# Patient Record
Sex: Male | Born: 1965
Health system: Southern US, Community
[De-identification: ages and names within clinical notes are randomized; demographics above are authoritative.]

## PROBLEM LIST (undated history)

## (undated) DIAGNOSIS — E785 Hyperlipidemia, unspecified: Secondary | ICD-10-CM

## (undated) DIAGNOSIS — C801 Malignant (primary) neoplasm, unspecified: Secondary | ICD-10-CM

## (undated) DIAGNOSIS — N189 Chronic kidney disease, unspecified: Secondary | ICD-10-CM

## (undated) DIAGNOSIS — I10 Essential (primary) hypertension: Secondary | ICD-10-CM

## (undated) DIAGNOSIS — E119 Type 2 diabetes mellitus without complications: Secondary | ICD-10-CM

## (undated) DIAGNOSIS — N481 Balanitis: Secondary | ICD-10-CM

## (undated) DIAGNOSIS — K219 Gastro-esophageal reflux disease without esophagitis: Secondary | ICD-10-CM

## (undated) DIAGNOSIS — H905 Unspecified sensorineural hearing loss: Secondary | ICD-10-CM

## (undated) DIAGNOSIS — J410 Simple chronic bronchitis: Secondary | ICD-10-CM

## (undated) DIAGNOSIS — T7840XA Allergy, unspecified, initial encounter: Secondary | ICD-10-CM

## (undated) DIAGNOSIS — G4733 Obstructive sleep apnea (adult) (pediatric): Secondary | ICD-10-CM

## (undated) DIAGNOSIS — G4730 Sleep apnea, unspecified: Secondary | ICD-10-CM

## (undated) DIAGNOSIS — N2889 Other specified disorders of kidney and ureter: Secondary | ICD-10-CM

## (undated) DIAGNOSIS — J449 Chronic obstructive pulmonary disease, unspecified: Secondary | ICD-10-CM

## (undated) DIAGNOSIS — Z9989 Dependence on other enabling machines and devices: Secondary | ICD-10-CM

## (undated) DIAGNOSIS — G8929 Other chronic pain: Secondary | ICD-10-CM

## (undated) DIAGNOSIS — M1990 Unspecified osteoarthritis, unspecified site: Secondary | ICD-10-CM

## (undated) DIAGNOSIS — Z789 Other specified health status: Secondary | ICD-10-CM

## (undated) HISTORY — DX: Sleep apnea, unspecified: G47.30

## (undated) HISTORY — DX: Allergy, unspecified, initial encounter: T78.40XA

## (undated) HISTORY — PX: WRIST SURGERY: SHX841

## (undated) HISTORY — DX: Other specified health status: Z78.9

## (undated) HISTORY — DX: Malignant (primary) neoplasm, unspecified: C80.1

## (undated) HISTORY — PX: SHOULDER SURGERY: SHX246

## (undated) HISTORY — DX: Chronic obstructive pulmonary disease, unspecified: J44.9

---

## 2008-01-09 ENCOUNTER — Emergency Department (HOSPITAL_COMMUNITY): Admission: EM | Admit: 2008-01-09 | Discharge: 2008-01-10 | Payer: Self-pay | Admitting: Emergency Medicine

## 2008-07-17 ENCOUNTER — Encounter: Admission: RE | Admit: 2008-07-17 | Discharge: 2008-07-17 | Payer: Self-pay | Admitting: Orthopedic Surgery

## 2008-07-18 ENCOUNTER — Encounter: Admission: RE | Admit: 2008-07-18 | Discharge: 2008-07-18 | Payer: Self-pay | Admitting: Orthopedic Surgery

## 2008-07-21 ENCOUNTER — Encounter: Admission: RE | Admit: 2008-07-21 | Discharge: 2008-07-21 | Payer: Self-pay | Admitting: Orthopedic Surgery

## 2008-08-29 ENCOUNTER — Encounter: Admission: RE | Admit: 2008-08-29 | Discharge: 2008-09-20 | Payer: Self-pay | Admitting: Orthopedic Surgery

## 2008-09-25 ENCOUNTER — Encounter: Admission: RE | Admit: 2008-09-25 | Discharge: 2008-09-27 | Payer: Self-pay | Admitting: Orthopedic Surgery

## 2009-02-09 ENCOUNTER — Emergency Department (HOSPITAL_COMMUNITY): Admission: EM | Admit: 2009-02-09 | Discharge: 2009-02-09 | Payer: Self-pay | Admitting: Emergency Medicine

## 2009-03-04 ENCOUNTER — Ambulatory Visit (HOSPITAL_BASED_OUTPATIENT_CLINIC_OR_DEPARTMENT_OTHER): Admission: RE | Admit: 2009-03-04 | Discharge: 2009-03-04 | Payer: Self-pay | Admitting: Internal Medicine

## 2009-03-10 ENCOUNTER — Ambulatory Visit: Payer: Self-pay | Admitting: Internal Medicine

## 2009-09-23 ENCOUNTER — Ambulatory Visit (HOSPITAL_BASED_OUTPATIENT_CLINIC_OR_DEPARTMENT_OTHER): Admission: RE | Admit: 2009-09-23 | Discharge: 2009-09-23 | Payer: Self-pay | Admitting: Internal Medicine

## 2009-09-28 ENCOUNTER — Ambulatory Visit: Payer: Self-pay | Admitting: Internal Medicine

## 2011-01-28 ENCOUNTER — Other Ambulatory Visit: Payer: Self-pay | Admitting: Family Medicine

## 2011-01-28 ENCOUNTER — Ambulatory Visit
Admission: RE | Admit: 2011-01-28 | Discharge: 2011-01-28 | Disposition: A | Discharge status: Home / Self Care | Payer: No Typology Code available for payment source | Source: Ambulatory Visit | Attending: Family Medicine | Admitting: Family Medicine

## 2011-01-28 DIAGNOSIS — R059 Cough, unspecified: Secondary | ICD-10-CM

## 2011-01-28 DIAGNOSIS — R05 Cough: Secondary | ICD-10-CM

## 2011-01-28 DIAGNOSIS — R0600 Dyspnea, unspecified: Secondary | ICD-10-CM

## 2011-02-06 NOTE — Procedures (Signed)
NAME:  Stephen Chase, Stephen Chase NO.:  [id number]   MEDICAL RECORD NO.:  [id number]          PATIENT TYPE:  OUT   LOCATION:  SLEEP CENTER                 FACILITY:  [facility]   PHYSICIAN:  Clinton D. Maple Hudson, MD, FCCP, FACPDATE OF BIRTH:  April 18, 1967   DATE OF STUDY:                            NOCTURNAL POLYSOMNOGRAM   REFERRING PHYSICIAN:  Merlene Laughter. Renae Gloss, M.D.   INDICATION FOR STUDY:  Hypersomnia with sleep apnea.   EPWORTH SLEEPINESS SCORE:  Epworth sleepiness score 23/24.  BMI 35.4.  Weight 240 pounds, height 69 inches.  Neck 17 inches.   MEDICATIONS:  No home medication was reported.  This patient is deaf and  a signing interpreter came to assist.   SLEEP ARCHITECTURE:  Total sleep time 215 minutes with sleep efficiency  of 56%.  Stage I was 31.2%, stage II 56.7%, stage III absent, REM 12.1%  of total sleep time.  Sleep latency 24 minutes, REM latency 298 minutes,  awake-after-sleep onset 145 minutes, arousal index 80.7 indicating  increased EEG arousal.  No bedtime medication was taken.  Sleep  architecture was marked by frequent and sustained waking until 3:00 a.m.  with improved sleep efficiency then until final waking at 5:00 a.m.   RESPIRATORY DATA:  Apnea-hypopnea index (AHI) 70 per hour.  A total of  251 events was scored including 111 obstructive apneas, 19 central  apneas, 92 mixed apneas, and 29 hypopneas.  Events were seen in all  sleep positions but more common while supine.  REM AHI of 87.7.  An  additional 95 respiratory effort related arousals gave a cumulative  respiratory disturbance index (RDI) of 96.6 per hour.  The patient was  unable to maintain sufficient sleep to meet criteria for initiation of  CPAP titration by split protocol on the study night.   OXYGEN DATA:  Moderate-to-loud snoring with oxygen desaturation to a  nadir of 81%.  Mean oxygen saturation through the study 92.6% on room  air.   CARDIAC DATA:  Sinus rhythm with intervals of  marked sinus bradycardia  as low as 29 per minute and suggestion of sinus arrhythmia.  An example  is included with the report.   MOVEMENT-PARASOMNIA:  No significant movement disturbance.  Bathroom x4.   IMPRESSIONS-RECOMMENDATIONS:  1. Markedly fragmented sleep with low sleep efficiency, probably      mostly related to respiratory sleep disturbance, bathroom x4.  2. Severe obstructive sleep apnea/hypopnea syndrome, AHI of 70 per      hour, RDI of 96.6 per hour.  Events in all sleep positions.  Loud      snoring with oxygen desaturation to a nadir of 81%.  3. Because he was unable to accumulate enough sleep during the first      hours to meet requirements for CPAP titration by protocol, this      could not be done.  Consider return for CPAP titration or evaluate      for alternative      management as clinically indicated.  4. The patient is deaf and a sign language interpreter was available.      Clinton D. Maple Hudson, MD, FCCP, FACP  Diplomate, Biomedical engineer of Sleep Medicine  Electronically Signed     CDY/MEDQ  D:  03/09/2009 11:54:12  T:  03/10/2009 00:48:04  Job:  [id number]

## 2011-04-08 ENCOUNTER — Emergency Department (HOSPITAL_COMMUNITY)
Admission: EM | Admit: 2011-04-08 | Discharge: 2011-04-09 | Disposition: A | Discharge status: Home / Self Care | Payer: No Typology Code available for payment source | Attending: Emergency Medicine | Admitting: Emergency Medicine

## 2011-04-08 ENCOUNTER — Emergency Department (HOSPITAL_COMMUNITY): Payer: No Typology Code available for payment source

## 2011-04-08 ENCOUNTER — Ambulatory Visit (INDEPENDENT_AMBULATORY_CARE_PROVIDER_SITE_OTHER): Payer: No Typology Code available for payment source

## 2011-04-08 ENCOUNTER — Inpatient Hospital Stay (INDEPENDENT_AMBULATORY_CARE_PROVIDER_SITE_OTHER)
Admission: RE | Admit: 2011-04-08 | Discharge: 2011-04-08 | Disposition: A | Discharge status: Home / Self Care | Payer: No Typology Code available for payment source | Source: Ambulatory Visit | Attending: Family Medicine | Admitting: Family Medicine

## 2011-04-08 DIAGNOSIS — E7800 Pure hypercholesterolemia, unspecified: Secondary | ICD-10-CM | POA: Insufficient documentation

## 2011-04-08 DIAGNOSIS — E119 Type 2 diabetes mellitus without complications: Secondary | ICD-10-CM | POA: Insufficient documentation

## 2011-04-08 DIAGNOSIS — N289 Disorder of kidney and ureter, unspecified: Secondary | ICD-10-CM | POA: Insufficient documentation

## 2011-04-08 DIAGNOSIS — K219 Gastro-esophageal reflux disease without esophagitis: Secondary | ICD-10-CM | POA: Insufficient documentation

## 2011-04-08 DIAGNOSIS — H9190 Unspecified hearing loss, unspecified ear: Secondary | ICD-10-CM | POA: Insufficient documentation

## 2011-04-08 DIAGNOSIS — K3580 Unspecified acute appendicitis: Secondary | ICD-10-CM

## 2011-04-08 DIAGNOSIS — R109 Unspecified abdominal pain: Secondary | ICD-10-CM

## 2011-04-08 DIAGNOSIS — I10 Essential (primary) hypertension: Secondary | ICD-10-CM | POA: Insufficient documentation

## 2011-04-08 DIAGNOSIS — Z79899 Other long term (current) drug therapy: Secondary | ICD-10-CM | POA: Insufficient documentation

## 2011-04-08 LAB — HEPATIC FUNCTION PANEL
ALT: 87 U/L — ABNORMAL HIGH (ref 0–53)
Albumin: 4.1 g/dL (ref 3.5–5.2)
Alkaline Phosphatase: 93 U/L (ref 39–117)
Bilirubin, Direct: 0.1 mg/dL (ref 0.0–0.3)
Indirect Bilirubin: 0.4 mg/dL (ref 0.3–0.9)
Total Protein: 7.5 g/dL (ref 6.0–8.3)

## 2011-04-08 LAB — CBC
HCT: 45 % (ref 39.0–52.0)
MCH: 32.7 pg (ref 26.0–34.0)
MCHC: 36.4 g/dL — ABNORMAL HIGH (ref 30.0–36.0)
MCV: 89.8 fL (ref 78.0–100.0)

## 2011-04-08 LAB — POCT I-STAT, CHEM 8
BUN: 21 mg/dL (ref 6–23)
Creatinine, Ser: 2.1 mg/dL — ABNORMAL HIGH (ref 0.50–1.35)
HCT: 51 % (ref 39.0–52.0)
Hemoglobin: 17.3 g/dL — ABNORMAL HIGH (ref 13.0–17.0)
Sodium: 135 mEq/L (ref 135–145)
TCO2: 21 mmol/L (ref 0–100)

## 2011-04-08 LAB — POCT URINALYSIS DIP (DEVICE)
Hgb urine dipstick: NEGATIVE
Leukocytes, UA: NEGATIVE
Specific Gravity, Urine: >=1.03 (ref 1.005–1.030)
Urobilinogen, UA: 0.2 mg/dL (ref 0.0–1.0)
pH: 5 (ref 5.0–8.0)

## 2011-04-08 LAB — DIFFERENTIAL
Eosinophils Relative: 1 % (ref 0–5)
Lymphocytes Relative: 27 % (ref 12–46)
Lymphs Abs: 4.2 10*3/uL — ABNORMAL HIGH (ref 0.7–4.0)
Monocytes Absolute: 1.1 10*3/uL — ABNORMAL HIGH (ref 0.1–1.0)

## 2011-04-09 LAB — URINE CULTURE

## 2011-06-16 LAB — COMPREHENSIVE METABOLIC PANEL
AST: 39 — ABNORMAL HIGH
Albumin: 3.7
BUN: 7
CO2: 29
Creatinine, Ser: 1.11
GFR calc non Af Amer: >60
Total Protein: 6.8

## 2011-06-16 LAB — DIFFERENTIAL
Basophils Absolute: 0
Basophils Relative: 0
Eosinophils Absolute: 0.3
Eosinophils Relative: 3
Lymphocytes Relative: 31
Lymphs Abs: 3.1
Monocytes Absolute: 0.8
Monocytes Relative: 8
Neutro Abs: 5.8
Neutrophils Relative %: 58

## 2011-06-16 LAB — CBC
HCT: 45.9
Hemoglobin: 15.9
MCHC: 34.6
MCV: 90.4
Platelets: 204
RBC: 5.07
RDW: 13.1
WBC: 10

## 2011-06-16 LAB — COMPREHENSIVE METABOLIC PANEL WITH GFR
ALT: 73 — ABNORMAL HIGH
Alkaline Phosphatase: 101
Calcium: 9.5
Chloride: 104
GFR calc Af Amer: >60
Glucose, Bld: 102 — ABNORMAL HIGH
Potassium: 3.6
Sodium: 141
Total Bilirubin: 1.1

## 2011-06-16 LAB — LIPASE, BLOOD: Lipase: 36

## 2013-02-07 ENCOUNTER — Ambulatory Visit
Admission: RE | Admit: 2013-02-07 | Discharge: 2013-02-07 | Disposition: A | Discharge status: Home / Self Care | Payer: No Typology Code available for payment source | Source: Ambulatory Visit | Attending: Nurse Practitioner | Admitting: Nurse Practitioner

## 2013-02-07 ENCOUNTER — Other Ambulatory Visit: Payer: Self-pay | Admitting: Nurse Practitioner

## 2013-02-07 DIAGNOSIS — W19XXXA Unspecified fall, initial encounter: Secondary | ICD-10-CM

## 2013-12-14 ENCOUNTER — Encounter (HOSPITAL_BASED_OUTPATIENT_CLINIC_OR_DEPARTMENT_OTHER): Payer: Self-pay | Admitting: *Deleted

## 2013-12-14 ENCOUNTER — Other Ambulatory Visit: Payer: Self-pay | Admitting: Urology

## 2013-12-14 NOTE — Progress Notes (Signed)
COMMUNICATED WITH PT [place] [phone].  PT AND WIFE ARE DEAF.  A MALE DEAF INTERPRETOR ARRANGEMENT WITH CARE MANAGEMENT TO ARRIVE AT 0800.  PT TO ARRIVE AT 0815.  NPO AFTER MN WITH EXCEPTION SIP OF WATER WITH COREG.  NEEDS ISTAT AND EKG.

## 2013-12-19 ENCOUNTER — Other Ambulatory Visit: Payer: Self-pay

## 2013-12-19 ENCOUNTER — Encounter (HOSPITAL_BASED_OUTPATIENT_CLINIC_OR_DEPARTMENT_OTHER)
Admission: RE | Disposition: A | Discharge status: Home / Self Care | Payer: Commercial Managed Care - HMO | Source: Ambulatory Visit | Attending: Urology

## 2013-12-19 ENCOUNTER — Encounter (HOSPITAL_BASED_OUTPATIENT_CLINIC_OR_DEPARTMENT_OTHER): Payer: Self-pay | Admitting: *Deleted

## 2013-12-19 ENCOUNTER — Ambulatory Visit (HOSPITAL_BASED_OUTPATIENT_CLINIC_OR_DEPARTMENT_OTHER): Payer: Medicare HMO | Admitting: Anesthesiology

## 2013-12-19 ENCOUNTER — Encounter (HOSPITAL_BASED_OUTPATIENT_CLINIC_OR_DEPARTMENT_OTHER): Payer: Medicare HMO | Admitting: Anesthesiology

## 2013-12-19 ENCOUNTER — Ambulatory Visit (HOSPITAL_BASED_OUTPATIENT_CLINIC_OR_DEPARTMENT_OTHER)
Admission: RE | Admit: 2013-12-19 | Discharge: 2013-12-19 | Disposition: A | Discharge status: Home / Self Care | Payer: Medicare HMO | Source: Ambulatory Visit | Attending: Urology | Admitting: Urology

## 2013-12-19 DIAGNOSIS — Z79899 Other long term (current) drug therapy: Secondary | ICD-10-CM | POA: Insufficient documentation

## 2013-12-19 DIAGNOSIS — N476 Balanoposthitis: Secondary | ICD-10-CM | POA: Insufficient documentation

## 2013-12-19 DIAGNOSIS — N478 Other disorders of prepuce: Secondary | ICD-10-CM | POA: Insufficient documentation

## 2013-12-19 DIAGNOSIS — N471 Phimosis: Secondary | ICD-10-CM | POA: Insufficient documentation

## 2013-12-19 DIAGNOSIS — E669 Obesity, unspecified: Secondary | ICD-10-CM | POA: Insufficient documentation

## 2013-12-19 DIAGNOSIS — K219 Gastro-esophageal reflux disease without esophagitis: Secondary | ICD-10-CM | POA: Insufficient documentation

## 2013-12-19 DIAGNOSIS — F17200 Nicotine dependence, unspecified, uncomplicated: Secondary | ICD-10-CM | POA: Insufficient documentation

## 2013-12-19 DIAGNOSIS — G4730 Sleep apnea, unspecified: Secondary | ICD-10-CM | POA: Insufficient documentation

## 2013-12-19 DIAGNOSIS — E7800 Pure hypercholesterolemia, unspecified: Secondary | ICD-10-CM | POA: Insufficient documentation

## 2013-12-19 DIAGNOSIS — E119 Type 2 diabetes mellitus without complications: Secondary | ICD-10-CM | POA: Insufficient documentation

## 2013-12-19 DIAGNOSIS — I10 Essential (primary) hypertension: Secondary | ICD-10-CM | POA: Insufficient documentation

## 2013-12-19 HISTORY — DX: Unspecified sensorineural hearing loss: H90.5

## 2013-12-19 HISTORY — DX: Unspecified osteoarthritis, unspecified site: M19.90

## 2013-12-19 HISTORY — DX: Essential (primary) hypertension: I10

## 2013-12-19 HISTORY — PX: CIRCUMCISION: SHX1350

## 2013-12-19 HISTORY — DX: Gastro-esophageal reflux disease without esophagitis: K21.9

## 2013-12-19 HISTORY — DX: Balanitis: N48.1

## 2013-12-19 HISTORY — DX: Hyperlipidemia, unspecified: E78.5

## 2013-12-19 HISTORY — DX: Type 2 diabetes mellitus without complications: E11.9

## 2013-12-19 HISTORY — DX: Dependence on other enabling machines and devices: Z99.89

## 2013-12-19 HISTORY — DX: Simple chronic bronchitis: J41.0

## 2013-12-19 HISTORY — DX: Obstructive sleep apnea (adult) (pediatric): G47.33

## 2013-12-19 LAB — POCT I-STAT 4, (NA,K, GLUC, HGB,HCT)
Glucose, Bld: 126 mg/dL — ABNORMAL HIGH (ref 70–99)
HCT: 47 % (ref 39.0–52.0)
HEMOGLOBIN: 16 g/dL (ref 13.0–17.0)
Potassium: 3.7 mEq/L (ref 3.7–5.3)
Sodium: 142 mEq/L (ref 137–147)

## 2013-12-19 LAB — GLUCOSE, CAPILLARY: Glucose-Capillary: 110 mg/dL — ABNORMAL HIGH (ref 70–99)

## 2013-12-19 SURGERY — CIRCUMCISION, ADULT
Anesthesia: General | Site: Penis

## 2013-12-19 MED ORDER — MIDAZOLAM HCL 2 MG/2ML IJ SOLN
INTRAMUSCULAR | Status: AC
  Filled 2013-12-19: qty 2

## 2013-12-19 MED ORDER — DEXAMETHASONE SODIUM PHOSPHATE 4 MG/ML IJ SOLN
INTRAMUSCULAR | Status: DC | PRN
  Administered 2013-12-19: 10 mg via INTRAVENOUS

## 2013-12-19 MED ORDER — FENTANYL CITRATE 0.05 MG/ML IJ SOLN
25.0000 ug | INTRAMUSCULAR | Status: DC | PRN
  Filled 2013-12-19: qty 1

## 2013-12-19 MED ORDER — MINERAL OIL LIGHT 100 % EX OIL
TOPICAL_OIL | CUTANEOUS | Status: DC | PRN
  Administered 2013-12-19: 1 via TOPICAL

## 2013-12-19 MED ORDER — PROPOFOL 10 MG/ML IV BOLUS
INTRAVENOUS | Status: DC | PRN
  Administered 2013-12-19: 200 mg via INTRAVENOUS

## 2013-12-19 MED ORDER — BUPIVACAINE HCL (PF) 0.25 % IJ SOLN
INTRAMUSCULAR | Status: DC | PRN
  Administered 2013-12-19: 10 mL

## 2013-12-19 MED ORDER — HYDROCODONE-ACETAMINOPHEN 5-325 MG PO TABS: 1.0000 | ORAL_TABLET | Freq: Four times a day (QID) | ORAL | Status: DC | PRN

## 2013-12-19 MED ORDER — FENTANYL CITRATE 0.05 MG/ML IJ SOLN
INTRAMUSCULAR | Status: DC | PRN
  Administered 2013-12-19 (×4): 25 ug via INTRAVENOUS
  Administered 2013-12-19: 50 ug via INTRAVENOUS

## 2013-12-19 MED ORDER — FENTANYL CITRATE 0.05 MG/ML IJ SOLN
INTRAMUSCULAR | Status: AC
  Filled 2013-12-19: qty 6

## 2013-12-19 MED ORDER — LIDOCAINE HCL (CARDIAC) 20 MG/ML IV SOLN
INTRAVENOUS | Status: DC | PRN
  Administered 2013-12-19: 60 mg via INTRAVENOUS

## 2013-12-19 MED ORDER — MIDAZOLAM HCL 5 MG/5ML IJ SOLN
INTRAMUSCULAR | Status: DC | PRN
  Administered 2013-12-19: 2 mg via INTRAVENOUS

## 2013-12-19 MED ORDER — LACTATED RINGERS IV SOLN
INTRAVENOUS | Status: DC
  Administered 2013-12-19: 09:00:00 via INTRAVENOUS
  Filled 2013-12-19: qty 1000

## 2013-12-19 MED ORDER — BACITRACIN-NEOMYCIN-POLYMYXIN 400-5-5000 EX OINT
TOPICAL_OINTMENT | CUTANEOUS | Status: DC | PRN
  Administered 2013-12-19: 1 via TOPICAL

## 2013-12-19 MED ORDER — PROMETHAZINE HCL 25 MG/ML IJ SOLN
6.2500 mg | INTRAMUSCULAR | Status: DC | PRN
  Filled 2013-12-19: qty 1

## 2013-12-19 SURGICAL SUPPLY — 30 items
BANDAGE CO FLEX L/F 1IN X 5YD (GAUZE/BANDAGES/DRESSINGS) IMPLANT
BLADE SURG 15 STRL LF DISP TIS (BLADE) ×1 IMPLANT
BLADE SURG 15 STRL SS (BLADE) ×1
BNDG COHESIVE 1X5 TAN STRL LF (GAUZE/BANDAGES/DRESSINGS) ×2 IMPLANT
BNDG CONFORM 2 STRL LF (GAUZE/BANDAGES/DRESSINGS) ×2 IMPLANT
CLOTH BEACON ORANGE TIMEOUT ST (SAFETY) ×2 IMPLANT
COVER MAYO STAND STRL (DRAPES) ×2 IMPLANT
COVER TABLE BACK 60X90 (DRAPES) ×2 IMPLANT
DRAPE PED LAPAROTOMY (DRAPES) ×2 IMPLANT
ELECT REM PT RETURN 9FT ADLT (ELECTROSURGICAL) ×2
ELECTRODE REM PT RTRN 9FT ADLT (ELECTROSURGICAL) ×1 IMPLANT
GAUZE VASELINE 1X8 (GAUZE/BANDAGES/DRESSINGS) ×2 IMPLANT
GLOVE BIO SURGEON STRL SZ7 (GLOVE) ×2 IMPLANT
GLOVE BIOGEL M STER SZ 6 (GLOVE) ×2 IMPLANT
GLOVE BIOGEL PI IND STRL 6.5 (GLOVE) ×1 IMPLANT
GLOVE BIOGEL PI IND STRL 7.5 (GLOVE) ×1 IMPLANT
GLOVE BIOGEL PI INDICATOR 6.5 (GLOVE) ×1
GLOVE BIOGEL PI INDICATOR 7.5 (GLOVE) ×1
GOWN PREVENTION PLUS LG XLONG (DISPOSABLE) IMPLANT
GOWN STRL REUS W/TWL LRG LVL3 (GOWN DISPOSABLE) ×4 IMPLANT
NEEDLE HYPO 25X1 1.5 SAFETY (NEEDLE) ×2 IMPLANT
NS IRRIG 500ML POUR BTL (IV SOLUTION) ×2 IMPLANT
PACK BASIN DAY SURGERY FS (CUSTOM PROCEDURE TRAY) ×2 IMPLANT
PENCIL BUTTON HOLSTER BLD 10FT (ELECTRODE) ×2 IMPLANT
SUT CHROMIC 4 0 P 3 18 (SUTURE) ×4 IMPLANT
SUT CHROMIC 5 0 RB 1 27 (SUTURE) IMPLANT
SYR CONTROL 10ML LL (SYRINGE) ×2 IMPLANT
TOWEL OR 17X24 6PK STRL BLUE (TOWEL DISPOSABLE) ×4 IMPLANT
TRAY DSU PREP LF (CUSTOM PROCEDURE TRAY) ×2 IMPLANT
WATER STERILE IRR 500ML POUR (IV SOLUTION) IMPLANT

## 2013-12-19 NOTE — Op Note (Signed)
Stephen Chase is a 48 y.o.   12/19/2013  General  Preop diagnosis: Balanitis, phimosis  Postop diagnosis: Same  Procedure done: Circumcision  Surgeon: Charlene Brooke. Dierre Crevier  Anesthesia: General  Indication: Patient is a 48 years old male who presented with complaints of irritation of the foreskin for several weeks. He was found to have phimosis and balanitis. He was treated with nystatin cream. He has a history of diabetes. The balanitis cleared up. He is now scheduled for circumcision.  Procedure: The patient was identified by his wrist band and proper timeout was taken.  Under general anesthesia he was prepped and draped and placed in the supine position. 2 circumferential incisions were made on the foreskin and the foreskin in between those 2 incisions was excised. A frenulotomy was done. Hemostasis was secured with electrocautery. Skin approximation was done with #4-0 chromic. Sterile  dressing was then applied to the wound.  The patient tolerated the procedure well and left the OR in satisfactory condition to postanesthesia care unit.  EBL: Minimal  Needles, sponges count: Correct

## 2013-12-19 NOTE — Transfer of Care (Signed)
Immediate Anesthesia Transfer of Care Note  Patient: Stephen Chase  Procedure(s) Performed: Procedure(s) (LRB): CIRCUMCISION ADULT (N/A)  Patient Location: PACU  Anesthesia Type: General  Level of Consciousness: awake, oriented, sedated and patient cooperative  Airway & Oxygen Therapy: Patient Spontanous Breathing and Patient connected to face mask oxygen  Post-op Assessment: Report given to PACU RN and Post -op Vital signs reviewed and stable  Post vital signs: Reviewed and stable  Complications: No apparent anesthesia complications

## 2013-12-19 NOTE — H&P (Signed)
History of Present Illness Stephen Chase returns for follow-up. He was prescribed Nystatin cream for balanitis. He is doing much better. He does not have any more penile pain. He is able to retract the foreskin. He has a history of diabetes. Urinalysis shows >1000 mgm glucose.   Past Medical History Problems  1. History of hypercholesterolemia (V12.29) 2. History of hypertension (V12.59) 3. History of sleep apnea (V13.89)  Surgical History Problems  1. History of No Surgical Problems  Current Meds 1. Carvedilol 6.25 MG Oral Tablet;  Therapy: (Recorded:03Mar2015) to Recorded 2. Crestor 40 MG Oral Tablet;  Therapy: (Recorded:03Mar2015) to Recorded 3. Fenofibrate 160 MG Oral Tablet;  Therapy: (Recorded:03Mar2015) to Recorded 4. Flomax 0.4 MG Oral Capsule (Tamsulosin HCl);  Therapy: (Recorded:03Mar2015) to Recorded 5. Lisinopril 20 MG Oral Tablet;  Therapy: (Recorded:03Mar2015) to Recorded 6. MetFORMIN HCl - 500 MG Oral Tablet;  Therapy: (Recorded:03Mar2015) to Recorded 7. Niacin ER 1000 MG Oral Tablet Extended Release;  Therapy: (Recorded:03Mar2015) to Recorded 8. Nystatin-Triamcinolone 100000-0.1 UNIT/GM-% External Cream; APPLY SPARINGLY TO  AFFECTED AREA(S) 3 TIMES A DAY;  Therapy: [id number] to (Evaluate:04Mar2015)  Requested for: [id number]; Last  Rx:03Mar2015 Ordered 9. Omeprazole 40 MG Oral Capsule Delayed Release;  Therapy: (Recorded:03Mar2015) to Recorded 10. Zetia 10 MG Oral Tablet;   Therapy: (Recorded:03Mar2015) to Recorded  Allergies Medication  1. No Known Drug Allergies  Family History Problems  1. Family history of malignant neoplasm (V16.9) : Mother 2. Family history of myocardial infarction (V17.3) : Father  Social History Problems  1. Alcohol use (V49.89) 2. Caffeine use (V49.89) 3. Current smoker (305.1) 4. Father deceased   39 liver failure 5. Married 6. Mother deceased   47 cancer unspecified  Review of Systems Genitourinary, constitutional,  skin, eye, otolaryngeal, hematologic/lymphatic, cardiovascular, pulmonary, endocrine, musculoskeletal, gastrointestinal, neurological and psychiatric system(s) were reviewed and pertinent findings if present are noted.  Genitourinary: urinary frequency and penile erythema.    Physical Exam Constitutional: Well nourished and well developed . No acute distress.  ENT:. The ears and nose are normal in appearance.  Neck: The appearance of the neck is normal and no neck mass is present.  Pulmonary: No respiratory distress and normal respiratory rhythm and effort.  Cardiovascular: Heart rate and rhythm are normal . No peripheral edema.  Abdomen: The abdomen is soft and nontender. No masses are palpated. No CVA tenderness. No hernias are palpable. No hepatosplenomegaly noted.  Genitourinary: Examination of the penis demonstrates no discharge, no masses, no lesions and a normal meatus. The scrotum is without lesions. The right epididymis is palpably normal and non-tender. The left epididymis is palpably normal and non-tender. The right testis is non-tender and without masses. The left testis is non-tender and without masses.  Lymphatics: The femoral and inguinal nodes are not enlarged or tender.  Skin: Normal skin turgor, no visible rash and no visible skin lesions.  Neuro/Psych:. Mood and affect are appropriate.    Results/Data Urine [Data Includes: Last 1 Day]   [id number]  COLOR YELLOW   APPEARANCE CLEAR   SPECIFIC GRAVITY 1.020   pH 5.5   GLUCOSE > 1000 mg/dL  BILIRUBIN NEG   KETONE NEG mg/dL  BLOOD NEG   PROTEIN NEG mg/dL  UROBILINOGEN 0.2 mg/dL  NITRITE NEG   LEUKOCYTE ESTERASE NEG    Assessment Assessed  1. Balanitis (607.1) 2. Diabetes mellitus (250.00)  Plan Balanitis  1. Follow-up Schedule Surgery Office  Follow-up  Status: Hold For - Appointment   Requested for: [id number] Health Maintenance  2. UA With  REFLEX; [Do Not Release]; Status:Resulted - Requires Verification;    Done:  [id number] 03:37PM  He needs circumcision. The procedure, risks, benefits were explained to the patient through an interpreter. The risks include but are not limited to hemorrhage, infection, hematoma, skin loss.  He states that he understands and wish

## 2013-12-19 NOTE — Anesthesia Preprocedure Evaluation (Addendum)
Anesthesia Evaluation  Patient identified by MRN, date of birth, ID band Patient awake    Reviewed: Allergy & Precautions, H&P , NPO status , Patient's Chart, lab work & pertinent test results  Airway Mallampati: II TM Distance: >3 FB Neck ROM: Full    Dental no notable dental hx.    Pulmonary sleep apnea , Current Smoker,  breath sounds clear to auscultation  Pulmonary exam normal       Cardiovascular Exercise Tolerance: Good hypertension, Pt. on medications and Pt. on home beta blockers Rhythm:Regular Rate:Normal  ECG: normal.   Neuro/Psych negative neurological ROS  negative psych ROS   GI/Hepatic Neg liver ROS, GERD-  Medicated,  Endo/Other  diabetes, Type 2, Oral Hypoglycemic Agents  Renal/GU negative Renal ROS  negative genitourinary   Musculoskeletal negative musculoskeletal ROS (+)   Abdominal (+) + obese,   Peds negative pediatric ROS (+)  Hematology negative hematology ROS (+)   Anesthesia Other Findings   Reproductive/Obstetrics negative OB ROS                         Anesthesia Physical Anesthesia Plan  ASA: II  Anesthesia Plan: General   Post-op Pain Management:    Induction: Intravenous  Airway Management Planned: LMA  Additional Equipment:   Intra-op Plan:   Post-operative Plan: Extubation in OR  Informed Consent: I have reviewed the patients History and Physical, chart, labs and discussed the procedure including the risks, benefits and alternatives for the proposed anesthesia with the patient or authorized representative who has indicated his/her understanding and acceptance.   Dental advisory given  Plan Discussed with: CRNA  Anesthesia Plan Comments:         Anesthesia Quick Evaluation

## 2013-12-19 NOTE — Anesthesia Procedure Notes (Signed)
Procedure Name: LMA Insertion Date/Time: 12/19/2013 10:05 AM Performed by: Denna Haggard D Pre-anesthesia Checklist: Patient identified, Emergency Drugs available, Suction available and Patient being monitored Patient Re-evaluated:Patient Re-evaluated prior to inductionOxygen Delivery Method: Circle System Utilized Preoxygenation: Pre-oxygenation with 100% oxygen Intubation Type: IV induction Ventilation: Mask ventilation without difficulty LMA: LMA inserted LMA Size: 4.0 Number of attempts: 1 Airway Equipment and Method: bite block Placement Confirmation: positive ETCO2 Tube secured with: Tape Dental Injury: Teeth and Oropharynx as per pre-operative assessment

## 2013-12-19 NOTE — Discharge Instructions (Signed)
°  Post Anesthesia Home Care Instructions  Activity: Get plenty of rest for the remainder of the day. A responsible adult should stay with you for 24 hours following the procedure.  For the next 24 hours, DO NOT: -Drive a car -Paediatric nurse -Drink alcoholic beverages -Take any medication unless instructed by your physician -Make any legal decisions or sign important papers.  Meals: Start with liquid foods such as gelatin or soup. Progress to regular foods as tolerated. Avoid greasy, spicy, heavy foods. If nausea and/or vomiting occur, drink only clear liquids until the nausea and/or vomiting subsides. Call your physician if vomiting continues.  Special Instructions/Symptoms: Your throat may feel dry or sore from the anesthesia or the breathing tube placed in your throat during surgery. If this causes discomfort, gargle with warm salt water. The discomfort should disappear within 24 hours.  Circumcision-Home Care Instructions  The following instructions have been prepared to help you care for yourself upon your return home today.   Wound Care & Hygiene:   You may apply ice to the penis.  This may help to decrease swelling.  Remove the dressing tomorrow.  If the dressing falls off before then, leave it off.  You may shower or bathe in 48 hours  Gently wash the penis with soap and water.  The stitches do not need to be removed.  Activity:  Do not drive or operate any equipment today.  The effects of anesthesia are still present, drowsiness may result.  Rest today, not necessarily flat bed rest, just take it easy.  You may resume your normal activity in one to two days or as indicated by your physician.  Sexual Activity:  Erection and sexual relations should be avoided for *2 weeks.  Return to Work:  One to two days or as indicated by your physician ***.  Diet:  Drink liquids or eat a very light diet this evening.  You may resume a regular diet tomorrow.  General Expectations of  your surgery:   You may have a small amount of bleeding  The penis will be swollen and bruised for approximately one week  You may wake during the night with an erection, usually this is caused by having a full bladder so you should try to urinate (pass your water) to relieve the erection or apply ice to the penis  Unexpected Observations - Call your doctor if these occur!  Persistent or heavy bleeding  Temperature of 101 degrees or more  Severe pain not relieved by medication  Return to Office *** .  Call to schedule your appointment ***.  Additional Instructions:  ***  Patient's Signature:__________________________________________________  Physician's Signature:___________________ Nurse's Signature_______________  [facility] [phone] [address] [place], [place] [address] Tel: [phone]

## 2013-12-20 ENCOUNTER — Encounter (HOSPITAL_BASED_OUTPATIENT_CLINIC_OR_DEPARTMENT_OTHER): Payer: Self-pay | Admitting: Urology

## 2013-12-20 NOTE — Anesthesia Postprocedure Evaluation (Signed)
  Anesthesia Post-op Note  Patient: Stephen Chase  Procedure(s) Performed: Procedure(s) (LRB): CIRCUMCISION ADULT (N/A)  Patient Location: PACU  Anesthesia Type: General  Level of Consciousness: awake and alert   Airway and Oxygen Therapy: Patient Spontanous Breathing  Post-op Pain: mild  Post-op Assessment: Post-op Vital signs reviewed, Patient's Cardiovascular Status Stable, Respiratory Function Stable, Patent Airway and No signs of Nausea or vomiting  Last Vitals:  Filed Vitals:   12/19/13 1245  BP: 101/64  Pulse: 64  Temp: 36.2 C  Resp: 16    Post-op Vital Signs: stable   Complications: No apparent anesthesia complications

## 2014-10-20 DIAGNOSIS — G4733 Obstructive sleep apnea (adult) (pediatric): Secondary | ICD-10-CM | POA: Diagnosis not present

## 2014-10-23 DIAGNOSIS — H913 Deaf nonspeaking, not elsewhere classified: Secondary | ICD-10-CM | POA: Diagnosis not present

## 2014-10-23 DIAGNOSIS — E782 Mixed hyperlipidemia: Secondary | ICD-10-CM | POA: Diagnosis not present

## 2014-10-23 DIAGNOSIS — I10 Essential (primary) hypertension: Secondary | ICD-10-CM | POA: Diagnosis not present

## 2014-10-23 DIAGNOSIS — J309 Allergic rhinitis, unspecified: Secondary | ICD-10-CM | POA: Diagnosis not present

## 2014-10-23 DIAGNOSIS — E119 Type 2 diabetes mellitus without complications: Secondary | ICD-10-CM | POA: Diagnosis not present

## 2014-10-29 DIAGNOSIS — G4733 Obstructive sleep apnea (adult) (pediatric): Secondary | ICD-10-CM | POA: Diagnosis not present

## 2014-11-19 DIAGNOSIS — G4733 Obstructive sleep apnea (adult) (pediatric): Secondary | ICD-10-CM | POA: Diagnosis not present

## 2014-12-19 DIAGNOSIS — G4733 Obstructive sleep apnea (adult) (pediatric): Secondary | ICD-10-CM | POA: Diagnosis not present

## 2015-01-19 DIAGNOSIS — G4733 Obstructive sleep apnea (adult) (pediatric): Secondary | ICD-10-CM | POA: Diagnosis not present

## 2015-01-29 DIAGNOSIS — G4733 Obstructive sleep apnea (adult) (pediatric): Secondary | ICD-10-CM | POA: Diagnosis not present

## 2015-02-18 DIAGNOSIS — G4733 Obstructive sleep apnea (adult) (pediatric): Secondary | ICD-10-CM | POA: Diagnosis not present

## 2015-03-13 DIAGNOSIS — J371 Chronic laryngotracheitis: Secondary | ICD-10-CM | POA: Diagnosis not present

## 2015-03-13 DIAGNOSIS — I10 Essential (primary) hypertension: Secondary | ICD-10-CM | POA: Diagnosis not present

## 2015-03-13 DIAGNOSIS — G4739 Other sleep apnea: Secondary | ICD-10-CM | POA: Diagnosis not present

## 2015-03-13 DIAGNOSIS — H913 Deaf nonspeaking, not elsewhere classified: Secondary | ICD-10-CM | POA: Diagnosis not present

## 2015-03-13 DIAGNOSIS — E119 Type 2 diabetes mellitus without complications: Secondary | ICD-10-CM | POA: Diagnosis not present

## 2015-03-13 DIAGNOSIS — E782 Mixed hyperlipidemia: Secondary | ICD-10-CM | POA: Diagnosis not present

## 2015-03-13 DIAGNOSIS — Z79899 Other long term (current) drug therapy: Secondary | ICD-10-CM | POA: Diagnosis not present

## 2015-03-13 DIAGNOSIS — Z5181 Encounter for therapeutic drug level monitoring: Secondary | ICD-10-CM | POA: Diagnosis not present

## 2015-03-21 DIAGNOSIS — G4733 Obstructive sleep apnea (adult) (pediatric): Secondary | ICD-10-CM | POA: Diagnosis not present

## 2015-04-20 DIAGNOSIS — G4733 Obstructive sleep apnea (adult) (pediatric): Secondary | ICD-10-CM | POA: Diagnosis not present

## 2015-06-18 DIAGNOSIS — I10 Essential (primary) hypertension: Secondary | ICD-10-CM | POA: Diagnosis not present

## 2015-06-18 DIAGNOSIS — Z125 Encounter for screening for malignant neoplasm of prostate: Secondary | ICD-10-CM | POA: Diagnosis not present

## 2015-06-18 DIAGNOSIS — E119 Type 2 diabetes mellitus without complications: Secondary | ICD-10-CM | POA: Diagnosis not present

## 2015-06-18 DIAGNOSIS — Z79899 Other long term (current) drug therapy: Secondary | ICD-10-CM | POA: Diagnosis not present

## 2015-06-18 DIAGNOSIS — H913 Deaf nonspeaking, not elsewhere classified: Secondary | ICD-10-CM | POA: Diagnosis not present

## 2015-06-18 DIAGNOSIS — Z0000 Encounter for general adult medical examination without abnormal findings: Secondary | ICD-10-CM | POA: Diagnosis not present

## 2015-06-18 DIAGNOSIS — Z23 Encounter for immunization: Secondary | ICD-10-CM | POA: Diagnosis not present

## 2015-06-18 DIAGNOSIS — E782 Mixed hyperlipidemia: Secondary | ICD-10-CM | POA: Diagnosis not present

## 2015-08-01 DIAGNOSIS — G4733 Obstructive sleep apnea (adult) (pediatric): Secondary | ICD-10-CM | POA: Diagnosis not present

## 2015-09-20 DIAGNOSIS — E119 Type 2 diabetes mellitus without complications: Secondary | ICD-10-CM | POA: Diagnosis not present

## 2015-09-20 DIAGNOSIS — Z79899 Other long term (current) drug therapy: Secondary | ICD-10-CM | POA: Diagnosis not present

## 2015-09-20 DIAGNOSIS — I10 Essential (primary) hypertension: Secondary | ICD-10-CM | POA: Diagnosis not present

## 2015-09-20 DIAGNOSIS — L0390 Cellulitis, unspecified: Secondary | ICD-10-CM | POA: Diagnosis not present

## 2015-09-20 DIAGNOSIS — E782 Mixed hyperlipidemia: Secondary | ICD-10-CM | POA: Diagnosis not present

## 2015-09-20 DIAGNOSIS — L03115 Cellulitis of right lower limb: Secondary | ICD-10-CM | POA: Diagnosis not present

## 2015-10-21 DIAGNOSIS — Z136 Encounter for screening for cardiovascular disorders: Secondary | ICD-10-CM | POA: Diagnosis not present

## 2015-10-21 DIAGNOSIS — E1165 Type 2 diabetes mellitus with hyperglycemia: Secondary | ICD-10-CM | POA: Diagnosis not present

## 2015-10-21 DIAGNOSIS — E782 Mixed hyperlipidemia: Secondary | ICD-10-CM | POA: Diagnosis not present

## 2015-10-21 DIAGNOSIS — I10 Essential (primary) hypertension: Secondary | ICD-10-CM | POA: Diagnosis not present

## 2015-11-01 DIAGNOSIS — I10 Essential (primary) hypertension: Secondary | ICD-10-CM | POA: Diagnosis not present

## 2015-11-01 DIAGNOSIS — E782 Mixed hyperlipidemia: Secondary | ICD-10-CM | POA: Diagnosis not present

## 2015-11-04 DIAGNOSIS — I2510 Atherosclerotic heart disease of native coronary artery without angina pectoris: Secondary | ICD-10-CM | POA: Diagnosis not present

## 2015-11-05 DIAGNOSIS — Z136 Encounter for screening for cardiovascular disorders: Secondary | ICD-10-CM | POA: Diagnosis not present

## 2015-11-05 DIAGNOSIS — G4733 Obstructive sleep apnea (adult) (pediatric): Secondary | ICD-10-CM | POA: Diagnosis not present

## 2015-11-05 DIAGNOSIS — E1165 Type 2 diabetes mellitus with hyperglycemia: Secondary | ICD-10-CM | POA: Diagnosis not present

## 2015-11-05 DIAGNOSIS — E782 Mixed hyperlipidemia: Secondary | ICD-10-CM | POA: Diagnosis not present

## 2015-11-05 DIAGNOSIS — I10 Essential (primary) hypertension: Secondary | ICD-10-CM | POA: Diagnosis not present

## 2015-12-09 ENCOUNTER — Encounter (HOSPITAL_COMMUNITY): Payer: Self-pay | Admitting: Emergency Medicine

## 2015-12-09 ENCOUNTER — Emergency Department (HOSPITAL_COMMUNITY)
Admission: EM | Admit: 2015-12-09 | Discharge: 2015-12-09 | Disposition: A | Discharge status: Home / Self Care | Payer: Commercial Managed Care - HMO | Source: Home / Self Care | Attending: Family Medicine | Admitting: Family Medicine

## 2015-12-09 DIAGNOSIS — J069 Acute upper respiratory infection, unspecified: Secondary | ICD-10-CM | POA: Diagnosis not present

## 2015-12-09 DIAGNOSIS — H109 Unspecified conjunctivitis: Secondary | ICD-10-CM | POA: Diagnosis not present

## 2015-12-09 MED ORDER — AMOXICILLIN-POT CLAVULANATE 875-125 MG PO TABS: 1.0000 | ORAL_TABLET | Freq: Two times a day (BID) | ORAL | Status: DC

## 2015-12-09 NOTE — ED Provider Notes (Signed)
CSN: [id number]     Arrival date & time 12/09/15  1428 History   First MD Initiated Contact with Patient 12/09/15 1615     Chief Complaint  Patient presents with  . URI   (Consider location/radiation/quality/duration/timing/severity/associated sxs/prior Treatment) HPI History obtained from patient: through deaf interpreter  Pt presents with the cc of:URI, and pink eye Duration of symptoms:several days Treatment prior to arrival:OTC meds not helping Context:sudden onset, works at [employer] exposed to lots of sickness Other symptoms include:low grade temp Pain score:0  Past Medical History  Diagnosis Date  . Balanitis   . Hypertension   . Hyperlipidemia   . Smokers' cough ([place])   . Type 2 diabetes mellitus ([place])   . Arthritis     SHOULDERS  . OSA on CPAP     SEVERE PER STUDY 03-09-2009  . GERD (gastroesophageal reflux disease)   . Deafness, sensorineural, childhood onset     BILATERAL SINCE AGE 36  SECONDARY TO UNKNOWN ILLNESS   Past Surgical History  Procedure Laterality Date  . Circumcision N/A 12/19/2013    Procedure: CIRCUMCISION ADULT;  Surgeon: Lowella Bandy, MD;  Location: [facility];  Service: Urology;  Laterality: N/A;   History reviewed. No pertinent family history. Social History  Substance Use Topics  . Smoking status: Current Every Day Smoker -- 1.00 packs/day for 30 years    Types: Cigarettes  . Smokeless tobacco: Never Used  . Alcohol Use: Yes     Comment: OCCASIONAL    Review of Systems Cough, red eye Allergies  Review of patient's allergies indicates no known allergies.  Home Medications   Prior to Admission medications   Medication Sig Start Date End Date Taking? Authorizing Provider  Canagliflozin (INVOKANA) 100 MG TABS Take 1 tablet by mouth daily.   Yes Historical Provider, MD  carvedilol (COREG) 6.25 MG tablet Take 6.25 mg by mouth 2 (two) times daily with a meal.   Yes Historical Provider, MD  ezetimibe (ZETIA) 10 MG tablet  Take 10 mg by mouth daily.   Yes Historical Provider, MD  fenofibrate 160 MG tablet Take 160 mg by mouth daily.   Yes Historical Provider, MD  HYDROcodone-acetaminophen (NORCO) 5-325 MG per tablet Take 1 tablet by mouth every 6 (six) hours as needed for moderate pain. 12/19/13  Yes Lowella Bandy, MD  HYDROcodone-acetaminophen (VICODIN) 5-500 MG per tablet Take 1 tablet by mouth every 6 (six) hours as needed for pain.   Yes Historical Provider, MD  lisinopril (PRINIVIL,ZESTRIL) 20 MG tablet Take 20 mg by mouth every morning.   Yes Historical Provider, MD  metFORMIN (GLUCOPHAGE) 500 MG tablet Take by mouth 2 (two) times daily with a meal.   Yes Historical Provider, MD  niacin (NIASPAN) 1000 MG CR tablet Take 1,000 mg by mouth at bedtime.   Yes Historical Provider, MD  NYSTATIN-TRIAMCINOLONE EX Apply topically.   Yes Historical Provider, MD  omeprazole (PRILOSEC) 40 MG capsule Take 40 mg by mouth every evening.   Yes Historical Provider, MD  psyllium (METAMUCIL) 58.6 % powder Take 1 packet by mouth daily.   Yes Historical Provider, MD  rosuvastatin (CRESTOR) 40 MG tablet Take 40 mg by mouth daily.   Yes Historical Provider, MD  tamsulosin (FLOMAX) 0.4 MG CAPS capsule Take 0.4 mg by mouth daily after supper.   Yes Historical Provider, MD  amoxicillin-clavulanate (AUGMENTIN) 875-125 MG tablet Take 1 tablet by mouth every 12 (twelve) hours. 12/09/15   Konrad Felix, PA   Meds Ordered  and Administered this Visit  Medications - No data to display  BP 127/76 mmHg  Pulse 86  Temp(Src) 99 F (37.2 C) (Oral)  Resp 16  SpO2 99% No data found.   Physical Exam NURSES NOTES AND VITAL SIGNS REVIEWED. CONSTITUTIONAL: Well developed, well nourished, no acute distress HEENT: normocephalic, atraumatic, right and left TM's are normal EYES: Conjunctiva on right injected, swollen, some drainage noted. Sclera injected  NECK:normal ROM, supple, no adenopathy PULMONARY:No respiratory distress, normal effort, Lungs:  CTAb/l, no wheezes, or increased work of breathing CARDIOVASCULAR: RRR, no murmur ABDOMEN: soft, ND, NT, +'ve BS MUSCULOSKELETAL: Normal ROM of all extremities,  SKIN: warm and dry without rash PSYCHIATRIC: Mood and affect, behavior are normal  ED Course  Procedures (including critical care time)  Labs Review Labs Reviewed - No data to display  Imaging Review No results found.   Visual Acuity Review  Right Eye Distance:   Left Eye Distance:   Bilateral Distance:    Right Eye Near:   Left Eye Near:    Bilateral Near:        rx augmentin MDM   1. Acute URI   2. Conjunctivitis of right eye     Patient is reassured that there are no issues that require transfer to higher level of care at this time or additional tests. Patient is advised to continue home symptomatic treatment. Patient is advised that if there are new or worsening symptoms to attend the emergency department, contact primary care provider, or return to UC. Instructions of care provided discharged home in stable condition. Return to work/school note provided.   THIS NOTE WAS GENERATED USING A VOICE RECOGNITION SOFTWARE PROGRAM. ALL REASONABLE EFFORTS  WERE MADE TO PROOFREAD THIS DOCUMENT FOR ACCURACY.  I have verbally reviewed the discharge instructions with the patient. A printed AVS was given to the patient.  All questions were answered prior to discharge.      Konrad Felix, PA 12/09/15 2156

## 2015-12-09 NOTE — ED Notes (Addendum)
Patient c/o cough with green sputum, sore throat, congestion x 1.5 week.  Patient began having right eye swelling and redness onset this morning. Patient reports nothing OTC seems to be helping.  Patient also has been feeling lightheaded. Patient is deaf and is using an interpreter.

## 2015-12-09 NOTE — Discharge Instructions (Signed)
Bacterial Conjunctivitis Bacterial conjunctivitis, commonly called pink eye, is an inflammation of the clear membrane that covers the white part of the eye (conjunctiva). The inflammation can also happen on the underside of the eyelids. The blood vessels in the conjunctiva become inflamed, causing the eye to become red or pink. Bacterial conjunctivitis may spread easily from one eye to another and from person to person (contagious).  CAUSES  Bacterial conjunctivitis is caused by bacteria. The bacteria may come from your own skin, your upper respiratory tract, or from someone else with bacterial conjunctivitis. SYMPTOMS  The normally white color of the eye or the underside of the eyelid is usually pink or red. The pink eye is usually associated with irritation, tearing, and some sensitivity to light. Bacterial conjunctivitis is often associated with a thick, yellowish discharge from the eye. The discharge may turn into a crust on the eyelids overnight, which causes your eyelids to stick together. If a discharge is present, there may also be some blurred vision in the affected eye. DIAGNOSIS  Bacterial conjunctivitis is diagnosed by your caregiver through an eye exam and the symptoms that you report. Your caregiver looks for changes in the surface tissues of your eyes, which may point to the specific type of conjunctivitis. A sample of any discharge may be collected on a cotton-tip swab if you have a severe case of conjunctivitis, if your cornea is affected, or if you keep getting repeat infections that do not respond to treatment. The sample will be sent to a lab to see if the inflammation is caused by a bacterial infection and to see if the infection will respond to antibiotic medicines. TREATMENT   Bacterial conjunctivitis is treated with antibiotics. Antibiotic eyedrops are most often used. However, antibiotic ointments are also available. Antibiotics pills are sometimes used. Artificial tears or eye  washes may ease discomfort. HOME CARE INSTRUCTIONS   To ease discomfort, apply a cool, clean washcloth to your eye for 10-20 minutes, 3-4 times a day.  Gently wipe away any drainage from your eye with a warm, wet washcloth or a cotton ball.  Wash your hands often with soap and water. Use paper towels to dry your hands.  Do not share towels or washcloths. This may spread the infection.  Change or wash your pillowcase every day.  You should not use eye makeup until the infection is gone.  Do not operate machinery or drive if your vision is blurred.  Stop using contact lenses. Ask your caregiver how to sterilize or replace your contacts before using them again. This depends on the type of contact lenses that you use.  When applying medicine to the infected eye, do not touch the edge of your eyelid with the eyedrop bottle or ointment tube. SEEK IMMEDIATE MEDICAL CARE IF:   Your infection has not improved within 3 days after beginning treatment.  You had yellow discharge from your eye and it returns.  You have increased eye pain.  Your eye redness is spreading.  Your vision becomes blurred.  You have a fever or persistent symptoms for more than 2-3 days.  You have a fever and your symptoms suddenly get worse.  You have facial pain, redness, or swelling. MAKE SURE YOU:   Understand these instructions.  Will watch your condition.  Will get help right away if you are not doing well or get worse.   This information is not intended to replace advice given to you by your health care provider. Make sure you   discuss any questions you have with your health care provider.   Document Released: 09/07/2005 Document Revised: 09/28/2014 Document Reviewed: 02/08/2012 Elsevier Interactive Patient Education 2016 Elsevier Inc. Upper Respiratory Infection, Adult Most upper respiratory infections (URIs) are caused by a virus. A URI affects the nose, throat, and upper air passages. The most  common type of URI is often called "the common cold." HOME CARE   Take medicines only as told by your doctor.  Gargle warm saltwater or take cough drops to comfort your throat as told by your doctor.  Use a warm mist humidifier or inhale steam from a shower to increase air moisture. This may make it easier to breathe.  Drink enough fluid to keep your pee (urine) clear or pale yellow.  Eat soups and other clear broths.  Have a healthy diet.  Rest as needed.  Go back to work when your fever is gone or your doctor says it is okay.  You may need to stay home longer to avoid giving your URI to others.  You can also wear a face mask and wash your hands often to prevent spread of the virus.  Use your inhaler more if you have asthma.  Do not use any tobacco products, including cigarettes, chewing tobacco, or electronic cigarettes. If you need help quitting, ask your doctor. GET HELP IF:  You are getting worse, not better.  Your symptoms are not helped by medicine.  You have chills.  You are getting more short of breath.  You have brown or red mucus.  You have yellow or brown discharge from your nose.  You have pain in your face, especially when you bend forward.  You have a fever.  You have puffy (swollen) neck glands.  You have pain while swallowing.  You have white areas in the back of your throat. GET HELP RIGHT AWAY IF:   You have very bad or constant:  Headache.  Ear pain.  Pain in your forehead, behind your eyes, and over your cheekbones (sinus pain).  Chest pain.  You have long-lasting (chronic) lung disease and any of the following:  Wheezing.  Long-lasting cough.  Coughing up blood.  A change in your usual mucus.  You have a stiff neck.  You have changes in your:  Vision.  Hearing.  Thinking.  Mood. MAKE SURE YOU:   Understand these instructions.  Will watch your condition.  Will get help right away if you are not doing well or  get worse.   This information is not intended to replace advice given to you by your health care provider. Make sure you discuss any questions you have with your health care provider.   Document Released: 02/24/2008 Document Revised: 01/22/2015 Document Reviewed: 12/13/2013 Elsevier Interactive Patient Education [employer].

## 2016-02-04 DIAGNOSIS — G4733 Obstructive sleep apnea (adult) (pediatric): Secondary | ICD-10-CM | POA: Diagnosis not present

## 2016-05-07 DIAGNOSIS — G4733 Obstructive sleep apnea (adult) (pediatric): Secondary | ICD-10-CM | POA: Diagnosis not present

## 2016-06-16 DIAGNOSIS — H524 Presbyopia: Secondary | ICD-10-CM | POA: Diagnosis not present

## 2016-06-16 DIAGNOSIS — E119 Type 2 diabetes mellitus without complications: Secondary | ICD-10-CM | POA: Diagnosis not present

## 2016-07-13 DIAGNOSIS — M25562 Pain in left knee: Secondary | ICD-10-CM | POA: Diagnosis not present

## 2016-07-13 DIAGNOSIS — E119 Type 2 diabetes mellitus without complications: Secondary | ICD-10-CM | POA: Diagnosis not present

## 2016-07-13 DIAGNOSIS — Z23 Encounter for immunization: Secondary | ICD-10-CM | POA: Diagnosis not present

## 2016-07-13 DIAGNOSIS — E782 Mixed hyperlipidemia: Secondary | ICD-10-CM | POA: Diagnosis not present

## 2016-07-13 DIAGNOSIS — E785 Hyperlipidemia, unspecified: Secondary | ICD-10-CM | POA: Diagnosis not present

## 2016-07-13 DIAGNOSIS — I10 Essential (primary) hypertension: Secondary | ICD-10-CM | POA: Diagnosis not present

## 2016-07-15 ENCOUNTER — Other Ambulatory Visit (HOSPITAL_COMMUNITY): Payer: Self-pay | Admitting: Nurse Practitioner

## 2016-07-15 DIAGNOSIS — R1310 Dysphagia, unspecified: Secondary | ICD-10-CM

## 2016-07-28 ENCOUNTER — Ambulatory Visit (HOSPITAL_COMMUNITY): Payer: Commercial Managed Care - HMO

## 2016-07-30 ENCOUNTER — Ambulatory Visit (HOSPITAL_COMMUNITY)
Admission: RE | Admit: 2016-07-30 | Discharge: 2016-07-30 | Disposition: A | Discharge status: Home / Self Care | Payer: Commercial Managed Care - HMO | Source: Ambulatory Visit | Attending: Nurse Practitioner | Admitting: Nurse Practitioner

## 2016-07-30 DIAGNOSIS — R1310 Dysphagia, unspecified: Secondary | ICD-10-CM

## 2016-08-07 DIAGNOSIS — G4733 Obstructive sleep apnea (adult) (pediatric): Secondary | ICD-10-CM | POA: Diagnosis not present

## 2016-10-12 DIAGNOSIS — E785 Hyperlipidemia, unspecified: Secondary | ICD-10-CM | POA: Diagnosis not present

## 2016-10-12 DIAGNOSIS — I10 Essential (primary) hypertension: Secondary | ICD-10-CM | POA: Diagnosis not present

## 2016-10-12 DIAGNOSIS — Z1211 Encounter for screening for malignant neoplasm of colon: Secondary | ICD-10-CM | POA: Diagnosis not present

## 2016-10-12 DIAGNOSIS — E119 Type 2 diabetes mellitus without complications: Secondary | ICD-10-CM | POA: Diagnosis not present

## 2016-10-12 DIAGNOSIS — Z0000 Encounter for general adult medical examination without abnormal findings: Secondary | ICD-10-CM | POA: Diagnosis not present

## 2016-10-12 DIAGNOSIS — Z79899 Other long term (current) drug therapy: Secondary | ICD-10-CM | POA: Diagnosis not present

## 2016-10-12 DIAGNOSIS — M25532 Pain in left wrist: Secondary | ICD-10-CM | POA: Diagnosis not present

## 2016-10-15 ENCOUNTER — Other Ambulatory Visit: Payer: Self-pay | Admitting: Nurse Practitioner

## 2016-10-15 ENCOUNTER — Ambulatory Visit
Admission: RE | Admit: 2016-10-15 | Discharge: 2016-10-15 | Disposition: A | Discharge status: Home / Self Care | Payer: Commercial Managed Care - HMO | Source: Ambulatory Visit | Attending: Nurse Practitioner | Admitting: Nurse Practitioner

## 2016-10-15 DIAGNOSIS — M25532 Pain in left wrist: Secondary | ICD-10-CM

## 2016-10-15 DIAGNOSIS — M7989 Other specified soft tissue disorders: Secondary | ICD-10-CM | POA: Diagnosis not present

## 2016-10-28 DIAGNOSIS — M654 Radial styloid tenosynovitis [de Quervain]: Secondary | ICD-10-CM | POA: Diagnosis not present

## 2016-11-09 DIAGNOSIS — G4733 Obstructive sleep apnea (adult) (pediatric): Secondary | ICD-10-CM | POA: Diagnosis not present

## 2016-11-11 ENCOUNTER — Encounter: Payer: Self-pay | Admitting: Physician Assistant

## 2016-11-19 DIAGNOSIS — G4733 Obstructive sleep apnea (adult) (pediatric): Secondary | ICD-10-CM | POA: Diagnosis not present

## 2016-11-20 ENCOUNTER — Other Ambulatory Visit (INDEPENDENT_AMBULATORY_CARE_PROVIDER_SITE_OTHER): Payer: Medicare HMO

## 2016-11-20 ENCOUNTER — Encounter (INDEPENDENT_AMBULATORY_CARE_PROVIDER_SITE_OTHER): Payer: Self-pay

## 2016-11-20 ENCOUNTER — Ambulatory Visit (INDEPENDENT_AMBULATORY_CARE_PROVIDER_SITE_OTHER): Payer: Medicare HMO | Admitting: Physician Assistant

## 2016-11-20 ENCOUNTER — Encounter: Payer: Self-pay | Admitting: Physician Assistant

## 2016-11-20 VITALS — BP 110/58 | HR 64 | Ht 69.0 in | Wt 250.1 lb

## 2016-11-20 DIAGNOSIS — R7989 Other specified abnormal findings of blood chemistry: Secondary | ICD-10-CM | POA: Diagnosis not present

## 2016-11-20 DIAGNOSIS — Z1211 Encounter for screening for malignant neoplasm of colon: Secondary | ICD-10-CM

## 2016-11-20 DIAGNOSIS — R945 Abnormal results of liver function studies: Secondary | ICD-10-CM

## 2016-11-20 DIAGNOSIS — R1310 Dysphagia, unspecified: Secondary | ICD-10-CM

## 2016-11-20 LAB — HEPATIC FUNCTION PANEL
ALK PHOS: 71 U/L (ref 39–117)
ALT: 54 U/L — ABNORMAL HIGH (ref 0–53)
AST: 42 U/L — AB (ref 0–37)
Albumin: 4.5 g/dL (ref 3.5–5.2)
Bilirubin, Direct: 0.1 mg/dL (ref 0.0–0.3)
Total Bilirubin: 0.6 mg/dL (ref 0.2–1.2)
Total Protein: 7.5 g/dL (ref 6.0–8.3)

## 2016-11-20 MED ORDER — NA SULFATE-K SULFATE-MG SULF 17.5-3.13-1.6 GM/177ML PO SOLN: 1.0000 | ORAL | 0 refills | Status: DC

## 2016-11-20 NOTE — Progress Notes (Signed)
Agree with the assessment and plan with the following changes.  In looking through LFTs in our system they have been persistently elevated since 2009, thus he has a chronic elevation. Prior CT in 2012 showed diffuse fatty liver which I suspect is the likely etiology, however would send basic serologies to rule out for other chronic liver diseases (hep B/C, TIBC, ferritin, ANA, IgG, SMA, ceruloplasmin, alpha one antitrypsin) and obtain US liver to assess for interval changes. We can discuss management pending findings. Thanks

## 2016-11-20 NOTE — Progress Notes (Signed)
Chief Complaint: Consult for screening colonoscopy and dysphagia  HPI:  Stephen Chase is a 51 year old hearing-impaired patient, with past medical history of arthritis, GERD, hyperlipidemia, hypertension, OSA on CPAP and type 2 diabetes, who was referred to me by [name], FNP for a consult of a screening colonoscopy and complaint of dysphagia .     A sign language interpreter is present during time of visit. Patient had recent labs 10/12/16 with a CMP showing a mildly elevated ALT at 68 and AST 58, other liver enzymes were normal. CBC was within normal limits.   Today, with the assistance of his sign language interpreter, the patient tells me that for the past year he has had an increase in frequency of trouble with swallowing. He tells me that he will eat and sometimes up to an hour later he still feels as though the food is stuck in his throat. He denies any regurgitation and tells me that eventually things go down. He also tells me that when this happens occasionally he will burp and taste acid in his mouth and also taste his food. This has never happened with liquids.   Patient also tells me today that his bowel movements are very inconsistent, apparently he has been trying to abide by a healthier diet due to his diabetes, but this has made him somewhat constipated. Patient tells me he had at least 2-3 bowel movements a day prior to a couple of months ago, but now may only have one and this is often "hard to pass". The patient is wondering if he needs a laxative today.   Patient denies knowledge of his elevated liver enzymes.   Patient denies fever, chills, blood in his stool, melena, weight loss, fatigue, anorexia, nausea, vomiting, abdominal pain or symptoms that awaken him at night.  Past Medical History:  Diagnosis Date  . Arthritis    SHOULDERS  . Balanitis   . Deafness, sensorineural, childhood onset    BILATERAL SINCE AGE 25  SECONDARY TO UNKNOWN ILLNESS  . GERD (gastroesophageal  reflux disease)   . Hyperlipidemia   . Hypertension   . OSA on CPAP    SEVERE PER STUDY 03-09-2009  . Sleep apnea   . Smokers' cough ([place])   . Type 2 diabetes mellitus ([place])     Past Surgical History:  Procedure Laterality Date  . CIRCUMCISION N/A 12/19/2013   Procedure: CIRCUMCISION ADULT;  Surgeon: Lowella Bandy, MD;  Location: [facility];  Service: Urology;  Laterality: N/A;    Current Outpatient Prescriptions  Medication Sig Dispense Refill  . amoxicillin-clavulanate (AUGMENTIN) 875-125 MG tablet Take 1 tablet by mouth every 12 (twelve) hours. 14 tablet 0  . Canagliflozin (INVOKANA) 100 MG TABS Take 1 tablet by mouth daily.    . carvedilol (COREG) 6.25 MG tablet Take 6.25 mg by mouth 2 (two) times daily with a meal.    . ezetimibe (ZETIA) 10 MG tablet Take 10 mg by mouth daily.    . fenofibrate 160 MG tablet Take 160 mg by mouth daily.    Marland Kitchen HYDROcodone-acetaminophen (NORCO) 5-325 MG per tablet Take 1 tablet by mouth every 6 (six) hours as needed for moderate pain. 12 tablet 0  . HYDROcodone-acetaminophen (VICODIN) 5-500 MG per tablet Take 1 tablet by mouth every 6 (six) hours as needed for pain.    Marland Kitchen lisinopril (PRINIVIL,ZESTRIL) 20 MG tablet Take 20 mg by mouth every morning.    . metFORMIN (GLUCOPHAGE) 500 MG tablet Take by mouth 2 (  two) times daily with a meal.    . niacin (NIASPAN) 1000 MG CR tablet Take 1,000 mg by mouth at bedtime.    . NYSTATIN-TRIAMCINOLONE EX Apply topically.    Marland Kitchen omeprazole (PRILOSEC) 40 MG capsule Take 40 mg by mouth every evening.    . psyllium (METAMUCIL) 58.6 % powder Take 1 packet by mouth daily.    . rosuvastatin (CRESTOR) 40 MG tablet Take 40 mg by mouth daily.    . tamsulosin (FLOMAX) 0.4 MG CAPS capsule Take 0.4 mg by mouth daily after supper.     No current facility-administered medications for this visit.     Allergies as of 11/20/2016  . (No Known Allergies)    Family History: No family history of colon cancer or  polyps  Social History   Social History  . Marital status: Married    Spouse name: N/A  . Number of children: N/A  . Years of education: N/A   Occupational History  . Not on file.   Social History Main Topics  . Smoking status: Current Every Day Smoker    Packs/day: 1.00    Years: 30.00    Types: Cigarettes  . Smokeless tobacco: Never Used  . Alcohol use Yes     Comment: OCCASIONAL  . Drug use: No  . Sexual activity: Not on file   Other Topics Concern  . Not on file   Social History Narrative  . No narrative on file    Review of Systems:    Constitutional: No weight loss, fever or chills Skin: No rash Cardiovascular: No chest pain Respiratory: No SOB Gastrointestinal: See HPI and otherwise negative Genitourinary: No dysuria or change in urinary frequency Neurological: No headache Musculoskeletal: No new muscle or joint pain Hematologic: No bleeding  Psychiatric: No history of depression or anxiety   Physical Exam:  Vital signs: BP (!) 110/58   Pulse 64   Ht 5\' 9"  (1.753 m)   Wt 250 lb 2 oz (113.5 kg)   BMI 36.94 kg/m    Constitutional:   Pleasant male appears to be in NAD, Well developed, Well nourished, alert and cooperative Head:  Normocephalic and atraumatic. Eyes:   PEERL, EOMI. No icterus. Conjunctiva pink Ears:  Hearing impaired Neck:  Supple Throat: Oral cavity and pharynx without inflammation, swelling or lesion.  Respiratory: Respirations even and unlabored. Lungs clear to auscultation bilaterally.   No wheezes, crackles, or rhonchi.  Cardiovascular: Normal S1, S2. No MRG. Regular rate and rhythm. No peripheral edema, cyanosis or pallor.  Gastrointestinal:  Soft, nondistended, mild generalized tenderness. No rebound or guarding. Normal bowel sounds. No appreciable masses or hepatomegaly. Rectal:  Not performed.  Msk:  Symmetrical without gross deformities. Without edema, no deformity or joint abnormality.  Neurologic:  Alert and  oriented x4;   grossly normal neurologically.  Skin:   Dry and intact without significant lesions or rashes. Psychiatric:  Demonstrates good judgement and reason without abnormal affect or behaviors.  Recent labs: See HPI  Assessment: 1. Screening colonoscopy: patient due in April due to age 84. Dysphagia:increasing to solids over past yr; consider esophageal stricture vs ring vs web vs mass vs other 3. Elevated liver enzymes: Review of chart shows a patient's liver enzymes have been mildly increased since 2009, at that time AST 39, ALT 73, in 2012 AST 75, ALT 87 and recently 2018 ALT 58, AST 68; somewhat decreased from 2012, consider relation to medications versus other  Plan: 1. Ordered a recheck of hepatic function  panel today, if enzymes remain elevated then may consider further workup in the future 2. Scheduled patient for an EGD with dilation and a colonoscopy in the [place] with Dr. Havery Moros, as he is supervising this morning. Discussed risks, benefits, limitations and alternatives the patient agrees to proceed. 3. Reviewed anti-dysphagia measures including taking small bites and drinking sips of water in between bites as well as avoiding distraction while eating and the chin tuck technique. 4. Recommend MiraLAX once daily for constipation. 5. Patient to follow in clinic per Dr. Doyne Keel recommendations after time of  procedure.  Ellouise Newer, PA-C [facility] 11/20/2016, 10:20 AM  Cc: [name], FNP

## 2016-11-20 NOTE — Patient Instructions (Addendum)
You have been scheduled for an endoscopy and colonoscopy. Please follow the written instructions given to you at your visit today. Please pick up your prep supplies at the pharmacy within the next 1-3 days. If you use inhalers (even only as needed), please bring them with you on the day of your procedure. Your physician has requested that you go to www.startemmi.com and enter the access code given to you at your visit today. This web site gives a general overview about your procedure. However, you should still follow specific instructions given to you by our office regarding your preparation for the procedure.  Use Miralax (laxative) one capful in 6-8 oz of water or juice once a day to help with constipation.

## 2016-11-23 ENCOUNTER — Telehealth: Payer: Self-pay

## 2016-11-23 NOTE — Telephone Encounter (Signed)
Manus Gunning, MD at 11/20/2016 10:15 AM   Status: Signed    Agree with the assessment and plan with the following changes.  In looking through LFTs in our system they have been persistently elevated since 2009, thus he has a chronic elevation. Prior CT in 2012 showed diffuse fatty liver which I suspect is the likely etiology, however would send basic serologies to rule out for other chronic liver diseases (hep B/C, TIBC, ferritin, ANA, IgG, SMA, ceruloplasmin, alpha one antitrypsin) and obtain US liver to assess for interval changes. We can discuss management pending findings. Thanks     Levin Erp, [place] at 11/20/2016 10:15 AM   Status: Signed    Dr. Havery Moros would like to go ahead and get labs including hep b/c, TIBC, ferritin, ANA, IgG, SMA, ceruloplasmin, alpha one antitrypsin and get an U/s RUQ-of liver-due to persistently elevated LFTs. Thanks-JLL

## 2016-11-23 NOTE — Progress Notes (Signed)
Dr. Havery Moros would like to go ahead and get labs including hep b/c, TIBC, ferritin, ANA, IgG, SMA, ceruloplasmin, alpha one antitrypsin and get an U/s RUQ-of liver-due to persistently elevated LFTs. Thanks-JLL

## 2016-11-23 NOTE — Telephone Encounter (Signed)
See results note. 

## 2016-11-24 ENCOUNTER — Telehealth: Payer: Self-pay | Admitting: Physician Assistant

## 2016-11-25 ENCOUNTER — Other Ambulatory Visit: Payer: Self-pay

## 2016-11-25 DIAGNOSIS — R748 Abnormal levels of other serum enzymes: Secondary | ICD-10-CM

## 2016-11-25 NOTE — Progress Notes (Signed)
As per pt request a detailed message has been left on the pt voice mail via interpreter.     You have been scheduled for an abdominal ultrasound at [facility] (1st floor of hospital) on 12/01/16 at 9 am. Please arrive 15 minutes prior to your appointment for registration. Make certain not to have anything to eat or drink 6 hours prior to your appointment. Should you need to reschedule your appointment, please contact radiology at [phone]. This test typically takes about 30 minutes to perform.

## 2016-11-25 NOTE — Progress Notes (Signed)
The labs are also in the system and the pt is aware to come in for the lab work it his earliest convenience.

## 2016-11-25 NOTE — Telephone Encounter (Signed)
See result note.  

## 2016-11-26 DIAGNOSIS — M654 Radial styloid tenosynovitis [de Quervain]: Secondary | ICD-10-CM | POA: Diagnosis not present

## 2016-12-01 ENCOUNTER — Ambulatory Visit (HOSPITAL_COMMUNITY): Admission: RE | Admit: 2016-12-01 | Payer: Medicare HMO | Source: Ambulatory Visit

## 2016-12-07 ENCOUNTER — Ambulatory Visit (HOSPITAL_COMMUNITY): Payer: Medicare HMO

## 2016-12-09 ENCOUNTER — Ambulatory Visit (HOSPITAL_COMMUNITY)
Admission: RE | Admit: 2016-12-09 | Discharge: 2016-12-09 | Disposition: A | Discharge status: Home / Self Care | Payer: Medicare HMO | Source: Ambulatory Visit | Attending: Physician Assistant | Admitting: Physician Assistant

## 2016-12-09 DIAGNOSIS — K760 Fatty (change of) liver, not elsewhere classified: Secondary | ICD-10-CM | POA: Insufficient documentation

## 2016-12-09 DIAGNOSIS — R748 Abnormal levels of other serum enzymes: Secondary | ICD-10-CM | POA: Insufficient documentation

## 2016-12-29 DIAGNOSIS — M654 Radial styloid tenosynovitis [de Quervain]: Secondary | ICD-10-CM | POA: Diagnosis not present

## 2017-01-12 DIAGNOSIS — M654 Radial styloid tenosynovitis [de Quervain]: Secondary | ICD-10-CM | POA: Diagnosis not present

## 2017-01-12 DIAGNOSIS — M25532 Pain in left wrist: Secondary | ICD-10-CM | POA: Diagnosis not present

## 2017-01-19 DIAGNOSIS — M25532 Pain in left wrist: Secondary | ICD-10-CM | POA: Diagnosis not present

## 2017-01-19 DIAGNOSIS — M654 Radial styloid tenosynovitis [de Quervain]: Secondary | ICD-10-CM | POA: Diagnosis not present

## 2017-01-21 DIAGNOSIS — M25532 Pain in left wrist: Secondary | ICD-10-CM | POA: Diagnosis not present

## 2017-01-21 DIAGNOSIS — M654 Radial styloid tenosynovitis [de Quervain]: Secondary | ICD-10-CM | POA: Diagnosis not present

## 2017-01-29 DIAGNOSIS — M654 Radial styloid tenosynovitis [de Quervain]: Secondary | ICD-10-CM | POA: Diagnosis not present

## 2017-01-29 DIAGNOSIS — M25532 Pain in left wrist: Secondary | ICD-10-CM | POA: Diagnosis not present

## 2017-02-01 DIAGNOSIS — M654 Radial styloid tenosynovitis [de Quervain]: Secondary | ICD-10-CM | POA: Diagnosis not present

## 2017-02-01 DIAGNOSIS — M25532 Pain in left wrist: Secondary | ICD-10-CM | POA: Diagnosis not present

## 2017-02-03 ENCOUNTER — Encounter: Payer: Self-pay | Admitting: Gastroenterology

## 2017-02-03 ENCOUNTER — Other Ambulatory Visit: Payer: Self-pay

## 2017-02-03 ENCOUNTER — Other Ambulatory Visit: Payer: Self-pay | Admitting: Gastroenterology

## 2017-02-03 ENCOUNTER — Other Ambulatory Visit (INDEPENDENT_AMBULATORY_CARE_PROVIDER_SITE_OTHER): Payer: Medicare HMO

## 2017-02-03 ENCOUNTER — Ambulatory Visit (AMBULATORY_SURGERY_CENTER): Payer: Medicare HMO | Admitting: Gastroenterology

## 2017-02-03 VITALS — BP 105/60 | HR 61 | Temp 98.2°F | Resp 16 | Ht 69.0 in | Wt 250.0 lb

## 2017-02-03 DIAGNOSIS — K317 Polyp of stomach and duodenum: Secondary | ICD-10-CM | POA: Diagnosis not present

## 2017-02-03 DIAGNOSIS — K769 Liver disease, unspecified: Secondary | ICD-10-CM

## 2017-02-03 DIAGNOSIS — R310 Gross hematuria: Secondary | ICD-10-CM | POA: Diagnosis not present

## 2017-02-03 DIAGNOSIS — K2990 Gastroduodenitis, unspecified, without bleeding: Secondary | ICD-10-CM | POA: Diagnosis not present

## 2017-02-03 DIAGNOSIS — D123 Benign neoplasm of transverse colon: Secondary | ICD-10-CM | POA: Diagnosis not present

## 2017-02-03 DIAGNOSIS — Z1211 Encounter for screening for malignant neoplasm of colon: Secondary | ICD-10-CM

## 2017-02-03 DIAGNOSIS — R1310 Dysphagia, unspecified: Secondary | ICD-10-CM

## 2017-02-03 DIAGNOSIS — Z1212 Encounter for screening for malignant neoplasm of rectum: Secondary | ICD-10-CM | POA: Diagnosis not present

## 2017-02-03 DIAGNOSIS — K2970 Gastritis, unspecified, without bleeding: Secondary | ICD-10-CM

## 2017-02-03 DIAGNOSIS — K2950 Unspecified chronic gastritis without bleeding: Secondary | ICD-10-CM | POA: Diagnosis not present

## 2017-02-03 DIAGNOSIS — D120 Benign neoplasm of cecum: Secondary | ICD-10-CM

## 2017-02-03 LAB — PROTIME-INR
INR: 1.3 ratio — AB (ref 0.8–1.0)
PROTHROMBIN TIME: 13.7 s — AB (ref 9.6–13.1)

## 2017-02-03 LAB — CBC WITH DIFFERENTIAL/PLATELET
Basophils Absolute: 0.1 10*3/uL (ref 0.0–0.1)
Basophils Relative: 0.9 % (ref 0.0–3.0)
Eosinophils Absolute: 0.2 10*3/uL (ref 0.0–0.7)
Eosinophils Relative: 3.5 % (ref 0.0–5.0)
HEMATOCRIT: 43 % (ref 39.0–52.0)
HEMOGLOBIN: 14.8 g/dL (ref 13.0–17.0)
LYMPHS ABS: 2.6 10*3/uL (ref 0.7–4.0)
LYMPHS PCT: 38.4 % (ref 12.0–46.0)
MCHC: 34.3 g/dL (ref 30.0–36.0)
MCV: 89.1 fl (ref 78.0–100.0)
MONOS PCT: 5.1 % (ref 3.0–12.0)
Monocytes Absolute: 0.3 10*3/uL (ref 0.1–1.0)
NEUTROS PCT: 52.1 % (ref 43.0–77.0)
Neutro Abs: 3.5 10*3/uL (ref 1.4–7.7)
PLATELETS: 127 10*3/uL — AB (ref 150.0–400.0)
RBC: 4.82 Mil/uL (ref 4.22–5.81)
RDW: 13.8 % (ref 11.5–15.5)
WBC: 6.8 10*3/uL (ref 4.0–10.5)

## 2017-02-03 MED ORDER — SODIUM CHLORIDE 0.9 % IV SOLN: 500.0000 mL | INTRAVENOUS | Status: DC

## 2017-02-03 NOTE — Progress Notes (Signed)
Dental advisory given to patient 

## 2017-02-03 NOTE — Op Note (Signed)
[place] Patient Name: Stephen Chase Procedure Date: 02/03/2017 7:48 AM MRN: [id number] Endoscopist: Remo Lipps P. Armbruster MD, MD Age: 51 Referring MD:  Date of Birth: Jan 27, 1966 Gender: Male Account #: [id number] Procedure:                Colonoscopy Indications:              Screening for colorectal malignant neoplasm, This                            is the patient's first colonoscopy Medicines:                Monitored Anesthesia Care Procedure:                Pre-Anesthesia Assessment:                           - Prior to the procedure, a History and Physical                            was performed, and patient medications and                            allergies were reviewed. The patient's tolerance of                            previous anesthesia was also reviewed. The risks                            and benefits of the procedure and the sedation                            options and risks were discussed with the patient.                            All questions were answered, and informed consent                            was obtained. Prior Anticoagulants: The patient has                            taken no previous anticoagulant or antiplatelet                            agents. ASA Grade Assessment: III - A patient with                            severe systemic disease. After reviewing the risks                            and benefits, the patient was deemed in                            satisfactory condition to undergo the procedure.  After obtaining informed consent, the colonoscope                            was passed under direct vision. Throughout the                            procedure, the patient's blood pressure, pulse, and                            oxygen saturations were monitored continuously. The                            Model CF-HQ190L [id number]) scope was introduced                            through the anus  and advanced to the the cecum,                            identified by appendiceal orifice and ileocecal                            valve. The colonoscopy was performed without                            difficulty. The patient tolerated the procedure                            well. The quality of the bowel preparation was                            good. The ileocecal valve, appendiceal orifice, and                            rectum were photographed. Scope In: 8:22:08 AM Scope Out: 8:40:49 AM Scope Withdrawal Time: 0 hours 16 minutes 22 seconds  Total Procedure Duration: 0 hours 18 minutes 41 seconds  Findings:                 The perianal and digital rectal examinations were                            normal.                           Multiple medium-mouthed diverticula were found in                            the sigmoid colon and cecum.                           A 5 mm polyp was found in the cecum. The polyp was                            pedunculated with a long stalk. The polyp was  removed with a hot snare. Resection and retrieval                            were complete.                           A 3 mm polyp was found in the hepatic flexure. The                            polyp was flat. The polyp was removed with a cold                            snare. Resection and retrieval were complete.                           Internal hemorrhoids were found during retroflexion.                           The exam was otherwise without abnormality. Complications:            No immediate complications. Estimated blood loss:                            Minimal. Estimated Blood Loss:     Estimated blood loss was minimal. Impression:               - Diverticulosis in the sigmoid colon and in the                            cecum.                           - One 5 mm polyp in the cecum, removed with a hot                            snare. Resected and retrieved.                            - One 3 mm polyp at the hepatic flexure, removed                            with a cold snare. Resected and retrieved.                           - Internal hemorrhoids.                           - The examination was otherwise normal. Recommendation:           - Patient has a contact number available for                            emergencies. The signs and symptoms of potential                            delayed  complications were discussed with the                            patient. Return to normal activities tomorrow.                            Written discharge instructions were provided to the                            patient.                           - Resume previous diet.                           - Continue present medications.                           - Await pathology results.                           - Repeat colonoscopy is recommended for                            surveillance. The colonoscopy date will be                            determined after pathology results from today's                            exam become available for review.                           - No ibuprofen, naproxen, or other non-steroidal                            anti-inflammatory drugs for 2 weeks after polyp                            removal. Remo Lipps P. Armbruster MD, MD 02/03/2017 8:45:25 AM This report has been signed electronically.

## 2017-02-03 NOTE — Progress Notes (Signed)
A/ox3 pleased with MAC, report to Celia RN 

## 2017-02-03 NOTE — Patient Instructions (Signed)
Discharge instructions given. Handouts on polyps,diverticulosis and hemorrhoids. Biopsies taken. Resume previous medications. Patient will go to lab after discharge. No ibuprofen, naproxen, or other NSAIDs for two weeks. YOU HAD AN ENDOSCOPIC PROCEDURE TODAY AT [facility]:   Refer to the procedure report that was given to you for any specific questions about what was found during the examination.  If the procedure report does not answer your questions, please call your gastroenterologist to clarify.  If you requested that your care partner not be given the details of your procedure findings, then the procedure report has been included in a sealed envelope for you to review at your convenience later.  YOU SHOULD EXPECT: Some feelings of bloating in the abdomen. Passage of more gas than usual.  Walking can help get rid of the air that was put into your GI tract during the procedure and reduce the bloating. If you had a lower endoscopy (such as a colonoscopy or flexible sigmoidoscopy) you may notice spotting of blood in your stool or on the toilet paper. If you underwent a bowel prep for your procedure, you may not have a normal bowel movement for a few days.  Please Note:  You might notice some irritation and congestion in your nose or some drainage.  This is from the oxygen used during your procedure.  There is no need for concern and it should clear up in a day or so.  SYMPTOMS TO REPORT IMMEDIATELY:   Following lower endoscopy (colonoscopy or flexible sigmoidoscopy):  Excessive amounts of blood in the stool  Significant tenderness or worsening of abdominal pains  Swelling of the abdomen that is new, acute  Fever of 100F or higher   Following upper endoscopy (EGD)  Vomiting of blood or coffee ground material  New chest pain or pain under the shoulder blades  Painful or persistently difficult swallowing  New shortness of breath  Fever of 100F or higher  Black,  tarry-looking stools  For urgent or emergent issues, a gastroenterologist can be reached at any hour by calling [phone].   DIET:  We do recommend a small meal at first, but then you may proceed to your regular diet.  Drink plenty of fluids but you should avoid alcoholic beverages for 24 hours.  ACTIVITY:  You should plan to take it easy for the rest of today and you should NOT DRIVE or use heavy machinery until tomorrow (because of the sedation medicines used during the test).    FOLLOW UP: Our staff will call the number listed on your records the next business day following your procedure to check on you and address any questions or concerns that you may have regarding the information given to you following your procedure. If we do not reach you, we will leave a message.  However, if you are feeling well and you are not experiencing any problems, there is no need to return our call.  We will assume that you have returned to your regular daily activities without incident.  If any biopsies were taken you will be contacted by phone or by letter within the next 1-3 weeks.  Please call us at [phone] if you have not heard about the biopsies in 3 weeks.    SIGNATURES/CONFIDENTIALITY: You and/or your care partner have signed paperwork which will be entered into your electronic medical record.  These signatures attest to the fact that that the information above on your After Visit Summary has been reviewed and is  understood.  Full responsibility of the confidentiality of this discharge information lies with you and/or your care-partner. 

## 2017-02-03 NOTE — Progress Notes (Signed)
Interpreter used today at the [facility] for this pt.  Interpreter's name is-. Patient sent to lab. Family member with patient.

## 2017-02-03 NOTE — Op Note (Signed)
[facility] Patient Name: Stephen Chase Procedure Date: 02/03/2017 7:48 AM MRN: [id number] Endoscopist: Remo Lipps P. Tatjana Turcott MD, MD Age: 51 Referring MD:  Date of Birth: 1965/10/12 Gender: Male Account #: [id number] Procedure:                Upper GI endoscopy Indications:              Dysphagia Medicines:                Monitored Anesthesia Care Procedure:                Pre-Anesthesia Assessment:                           - Prior to the procedure, a History and Physical                            was performed, and patient medications and                            allergies were reviewed. The patient's tolerance of                            previous anesthesia was also reviewed. The risks                            and benefits of the procedure and the sedation                            options and risks were discussed with the patient.                            All questions were answered, and informed consent                            was obtained. Prior Anticoagulants: The patient has                            taken no previous anticoagulant or antiplatelet                            agents. ASA Grade Assessment: III - A patient with                            severe systemic disease. After reviewing the risks                            and benefits, the patient was deemed in                            satisfactory condition to undergo the procedure.                           After obtaining informed consent, the endoscope was  passed under direct vision. Throughout the                            procedure, the patient's blood pressure, pulse, and                            oxygen saturations were monitored continuously. The                            Endoscope was introduced through the mouth, and                            advanced to the second part of duodenum. The upper                            GI endoscopy was accomplished without  difficulty.                            The patient tolerated the procedure well. Scope In: Scope Out: Findings:                 Esophagogastric landmarks were identified: the                            Z-line was found at 39 cm, the gastroesophageal                            junction was found at 39 cm and the upper extent of                            the gastric folds was found at 39 cm from the                            incisors.                           Varices were found in the lower third of the                            esophagus. They were small in size when insufflated.                           The exam of the esophagus was otherwise normal. No                            stenosis or stricture appreciated. Empiric dilation                            not done given the presence of varices.                           Two 4 to 6 mm sessile polyps were found in the  gastric antrum. These polyps were removed with a                            hot snare. Resection and retrieval were complete.                           Localized mild inflammation characterized by                            erythema was found in the gastric antrum. Biopsies                            were taken with a cold forceps for Helicobacter                            pylori testing.                           The exam of the stomach was otherwise normal. No                            gastric varices.                           The duodenal bulb and second portion of the                            duodenum were normal. Complications:            No immediate complications. Estimated blood loss:                            Minimal. Estimated Blood Loss:     Estimated blood loss was minimal. Impression:               - Esophagogastric landmarks identified.                           - Small esophageal varices - raising the question                            of underlying liver disease /  cirrhosis                           - Normal esophagus otherwise - no stenosis /                            stricture, empiric dilation not performed given                            presence of cirrhosis                           - Two benign appearing gastric polyps. Resected and  retrieved.                           - Gastritis. Biopsied.                           - Normal duodenal bulb and second portion of the                            duodenum. Recommendation:           - Patient has a contact number available for                            emergencies. The signs and symptoms of potential                            delayed complications were discussed with the                            patient. Return to normal activities tomorrow.                            Written discharge instructions were provided to the                            patient.                           - Resume previous diet.                           - Continue present medications including Coreg                            (which is a treatment for varices)                           - No ibuprofen, naproxen, or other non-steroidal                            anti-inflammatory drugs for 2 weeks after polyp                            removal.                           - Await pathology results.                           - Please go to the lab for lab work for chronic                            liver disease (previously ordered but not obtained)                           - Will add CBC and INR to lab work                           -  US shows fatty liver, may consider further cross                            sectional imaging of the liver or elastography to                            further evaluate for cirrhosis                           - Consideration for esophageal manometry to                            evaluate dysphagia if symptoms persist, rule out                             motility disorder Remo Lipps P. Mckaylie Vasey MD, MD 02/03/2017 8:53:09 AM This report has been signed electronically.

## 2017-02-03 NOTE — Progress Notes (Signed)
Called to room to assist during endoscopic procedure.  Patient ID and intended procedure confirmed with present staff. Received instructions for my participation in the procedure from the performing physician.  

## 2017-02-04 ENCOUNTER — Other Ambulatory Visit: Payer: Self-pay

## 2017-02-04 ENCOUNTER — Telehealth: Payer: Self-pay

## 2017-02-04 DIAGNOSIS — M654 Radial styloid tenosynovitis [de Quervain]: Secondary | ICD-10-CM | POA: Diagnosis not present

## 2017-02-04 DIAGNOSIS — M25532 Pain in left wrist: Secondary | ICD-10-CM | POA: Diagnosis not present

## 2017-02-04 NOTE — Telephone Encounter (Signed)
  Follow up Call-  Call back number 02/03/2017  Post procedure Call Back phone  # [phone]  video phone  Permission to leave phone message Yes  Some recent data might be hidden     Patient questions:  Do you have a fever, pain , or abdominal swelling? No. Pain Score  0 *  Have you tolerated food without any problems? Yes.    Have you been able to return to your normal activities? Yes.  Do you have any questions about your discharge instructions: Diet   No. Medications  No. Follow up visit  No.  Do you have questions or concerns about your Care? No.  Actions: * If pain score is 4 or above: No action needed, pain <4.

## 2017-02-05 ENCOUNTER — Other Ambulatory Visit (INDEPENDENT_AMBULATORY_CARE_PROVIDER_SITE_OTHER): Payer: Medicare HMO

## 2017-02-05 DIAGNOSIS — R748 Abnormal levels of other serum enzymes: Secondary | ICD-10-CM | POA: Diagnosis not present

## 2017-02-05 LAB — IBC PANEL
IRON: 81 ug/dL (ref 42–165)
SATURATION RATIOS: 20.2 % (ref 20.0–50.0)
TRANSFERRIN: 287 mg/dL (ref 212.0–360.0)

## 2017-02-05 LAB — FERRITIN: Ferritin: 280.8 ng/mL (ref 22.0–322.0)

## 2017-02-06 LAB — HEPATITIS B SURFACE ANTIGEN: HEP B S AG: NEGATIVE

## 2017-02-06 LAB — HEPATITIS C ANTIBODY: HCV Ab: NEGATIVE

## 2017-02-06 LAB — HEPATITIS B SURFACE ANTIBODY,QUALITATIVE: HEP B S AB: NEGATIVE

## 2017-02-08 ENCOUNTER — Encounter: Payer: Self-pay | Admitting: Gastroenterology

## 2017-02-08 DIAGNOSIS — M654 Radial styloid tenosynovitis [de Quervain]: Secondary | ICD-10-CM | POA: Diagnosis not present

## 2017-02-08 DIAGNOSIS — M25532 Pain in left wrist: Secondary | ICD-10-CM | POA: Diagnosis not present

## 2017-02-08 LAB — CERULOPLASMIN: Ceruloplasmin: 25 mg/dL (ref 18–36)

## 2017-02-08 LAB — ANTI-SMOOTH MUSCLE ANTIBODY, IGG: Smooth Muscle Ab: <20 U (ref <20)

## 2017-02-08 LAB — ALPHA-1-ANTITRYPSIN: A-1 Antitrypsin, Ser: 155 mg/dL (ref 83–199)

## 2017-02-08 LAB — TISSUE TRANSGLUTAMINASE, IGA: TISSUE TRANSGLUTAMINASE AB, IGA: 1 U/mL (ref <4)

## 2017-02-08 LAB — ANA: ANA: NEGATIVE

## 2017-02-09 ENCOUNTER — Telehealth: Payer: Self-pay

## 2017-02-09 ENCOUNTER — Other Ambulatory Visit: Payer: Self-pay

## 2017-02-09 DIAGNOSIS — M654 Radial styloid tenosynovitis [de Quervain]: Secondary | ICD-10-CM | POA: Diagnosis not present

## 2017-02-09 DIAGNOSIS — R7989 Other specified abnormal findings of blood chemistry: Secondary | ICD-10-CM

## 2017-02-09 DIAGNOSIS — R945 Abnormal results of liver function studies: Principal | ICD-10-CM

## 2017-02-09 NOTE — Telephone Encounter (Signed)
-----   Message from Manus Gunning, MD sent at 02/08/2017  4:54 PM EDT ----- Regarding: patient follow up - vaccine Stephen Chase, This patient tested negative for hep B, needs vaccine, but also needs hep A testing. Can you order this, and pending result, if negative, will need vaccine for both at the same time.   [name], on this patient's EGD he had esophageal varices. He has cirrhosis of the liver given constellation of findings, I will follow him up in clinic next month. The rest of his labs look okay  Thanks, Richardson Landry

## 2017-02-09 NOTE — Telephone Encounter (Signed)
Sent letter to patient with results and recommendations. Lab order placed for Hep A ab, total. Will wait to see results before scheduling appointment to receive vaccine(s).

## 2017-02-10 DIAGNOSIS — M654 Radial styloid tenosynovitis [de Quervain]: Secondary | ICD-10-CM | POA: Diagnosis not present

## 2017-02-10 DIAGNOSIS — M25532 Pain in left wrist: Secondary | ICD-10-CM | POA: Diagnosis not present

## 2017-02-17 ENCOUNTER — Other Ambulatory Visit: Payer: Medicare HMO

## 2017-02-17 DIAGNOSIS — R7989 Other specified abnormal findings of blood chemistry: Secondary | ICD-10-CM

## 2017-02-17 DIAGNOSIS — R945 Abnormal results of liver function studies: Principal | ICD-10-CM

## 2017-02-18 LAB — HEPATITIS A ANTIBODY, TOTAL: Hep A Total Ab: REACTIVE — AB

## 2017-02-19 DIAGNOSIS — M654 Radial styloid tenosynovitis [de Quervain]: Secondary | ICD-10-CM | POA: Diagnosis not present

## 2017-02-23 DIAGNOSIS — G4733 Obstructive sleep apnea (adult) (pediatric): Secondary | ICD-10-CM | POA: Diagnosis not present

## 2017-03-01 DIAGNOSIS — E782 Mixed hyperlipidemia: Secondary | ICD-10-CM | POA: Diagnosis not present

## 2017-03-01 DIAGNOSIS — E119 Type 2 diabetes mellitus without complications: Secondary | ICD-10-CM | POA: Diagnosis not present

## 2017-03-01 DIAGNOSIS — I10 Essential (primary) hypertension: Secondary | ICD-10-CM | POA: Diagnosis not present

## 2017-03-01 DIAGNOSIS — Z1211 Encounter for screening for malignant neoplasm of colon: Secondary | ICD-10-CM | POA: Diagnosis not present

## 2017-03-02 DIAGNOSIS — M654 Radial styloid tenosynovitis [de Quervain]: Secondary | ICD-10-CM | POA: Diagnosis not present

## 2017-03-03 ENCOUNTER — Ambulatory Visit (INDEPENDENT_AMBULATORY_CARE_PROVIDER_SITE_OTHER): Payer: Medicare HMO | Admitting: Gastroenterology

## 2017-03-03 ENCOUNTER — Other Ambulatory Visit (INDEPENDENT_AMBULATORY_CARE_PROVIDER_SITE_OTHER): Payer: Medicare HMO

## 2017-03-03 ENCOUNTER — Encounter: Payer: Self-pay | Admitting: Gastroenterology

## 2017-03-03 VITALS — BP 106/60 | HR 64 | Ht 69.0 in | Wt 250.0 lb

## 2017-03-03 DIAGNOSIS — R9389 Abnormal findings on diagnostic imaging of other specified body structures: Secondary | ICD-10-CM

## 2017-03-03 DIAGNOSIS — Z23 Encounter for immunization: Secondary | ICD-10-CM

## 2017-03-03 DIAGNOSIS — R938 Abnormal findings on diagnostic imaging of other specified body structures: Secondary | ICD-10-CM

## 2017-03-03 DIAGNOSIS — I8500 Esophageal varices without bleeding: Secondary | ICD-10-CM | POA: Diagnosis not present

## 2017-03-03 DIAGNOSIS — K7460 Unspecified cirrhosis of liver: Secondary | ICD-10-CM

## 2017-03-03 LAB — HEPATIC FUNCTION PANEL
ALT: 41 U/L (ref 0–53)
AST: 39 U/L — AB (ref 0–37)
Albumin: 4.7 g/dL (ref 3.5–5.2)
Alkaline Phosphatase: 75 U/L (ref 39–117)
BILIRUBIN TOTAL: 0.5 mg/dL (ref 0.2–1.2)
Bilirubin, Direct: 0.1 mg/dL (ref 0.0–0.3)
Total Protein: 7.7 g/dL (ref 6.0–8.3)

## 2017-03-03 LAB — CREATININE, SERUM: Creatinine, Ser: 1.03 mg/dL (ref 0.40–1.50)

## 2017-03-03 LAB — BUN: BUN: 19 mg/dL (ref 6–23)

## 2017-03-03 NOTE — Patient Instructions (Signed)
If you are age 50 or older, your body mass index should be between 23-30. Your Body mass index is 36.92 kg/m. If this is out of the aforementioned range listed, please consider follow up with your Primary Care Provider.  If you are age 15 or younger, your body mass index should be between 19-25. Your Body mass index is 36.92 kg/m. If this is out of the aformentioned range listed, please consider follow up with your Primary Care Provider.   Your physician has requested that you go to the basement for the following lab work before leaving today:  LFT, AFP  You have been given your Hepatitis B injection today.  You will need to have a nurse visit in 30 days for your next injection.  You have been scheduled for a CT scan of the abdomen and pelvis at [facility] ([address]---this is in the same building as Press [facility]).   You are scheduled on __________at ___________. You should arrive 15 minutes prior to your appointment time for registration. Please follow the written instructions below on the day of your exam:  WARNING: IF YOU ARE ALLERGIC TO IODINE/X-RAY DYE, PLEASE NOTIFY RADIOLOGY IMMEDIATELY AT [phone]! YOU WILL BE GIVEN A 13 HOUR PREMEDICATION PREP.  1) Do not eat or drink anything after ____________(4 hours prior to your test) 2) You have been given 2 bottles of oral contrast to drink. The solution may taste               better if refrigerated, but do NOT add ice or any other liquid to this solution. Shake             well before drinking.    Drink 1 bottle of contrast @ __________(2 hours prior to your exam)  Drink 1 bottle of contrast @ __________(1 hour prior to your exam)  You may take any medications as prescribed with a small amount of water except for the following: Metformin, Glucophage, Glucovance, Avandamet, Riomet, Fortamet, Actoplus Met, Janumet, Glumetza or Metaglip. The above medications must be held the day of the exam AND 48 hours after the  exam.  The purpose of you drinking the oral contrast is to aid in the visualization of your intestinal tract. The contrast solution may cause some diarrhea. Before your exam is started, you will be given a small amount of fluid to drink. Depending on your individual set of symptoms, you may also receive an intravenous injection of x-ray contrast/dye. Plan on being at [facility] for 30 minutes or longer, depending on the type of exam you are having performed.  This test typically takes 30-45 minutes to complete.  If you have any questions regarding your exam or if you need to reschedule, you may call the CT department at [phone] between the hours of 8:00 am and 5:00 pm, Monday-Friday.  ________________________________________________________________________  We have referred you to a nutritionist. They will contact you with an appointment date and time. However, if you do not have an appointment scheduled in 1 week please call our office back.  Please follow up with Dr. Havery Moros in 6 months. You will be contacted by letter when it is time to schedule this.  Thank you.

## 2017-03-03 NOTE — Progress Notes (Signed)
HPI :  51 y/o male here for a follow up visit. See intake history for full details of history on 11/20/2016.  He has a history of chronic elevation in ALT and fatty liver. Since his last visit he underwent an upper endoscopy for his symptoms of dysphagia. EGD showed no pathology for his dysphagia but was remarkable for small esophageal varices, raising suspicion for underlying cirrhosis. He underwent extensive serologic testing which was negative for chronic liver diseases. He was not immune to hepatitis B on this workup. CBC showed thrombocytopenia and INR of 1.3. Of note on remote CT scan in 2012 he had a 3cm portocaval lymph node of unclear significance. He does endorse a history of binge drinking in his 30-40s but was mostly only on the weekends, he denies any daily use of alcohol. He does have DM and is overweight, with history of fatty liver noted on CT scan in 2012. He is taking Coreg already for HTN. He endorses being prescribed mobic to use daily for pain, he denies other NSAID use.  Endoscopic history: EGD 02/03/2017 - small esophageal varices, normal esophagus otherwise without stenosis, gastric polyps removed (hyperplastic) and gastritis (HP negative) Colonoscopy 02/03/2017 - diverticulosis, 2 small adenomas, hemorrhoids  Past Medical History:  Diagnosis Date  . Arthritis    SHOULDERS  . Balanitis   . Deafness, sensorineural, childhood onset    BILATERAL SINCE AGE 38  SECONDARY TO UNKNOWN ILLNESS  . GERD (gastroesophageal reflux disease)   . Hyperlipidemia   . Hypertension   . OSA on CPAP    SEVERE PER STUDY 03-09-2009  . Sleep apnea    uses a cpap  . Smokers' cough ([place])   . Type 2 diabetes mellitus ([place])      Past Surgical History:  Procedure Laterality Date  . CIRCUMCISION N/A 12/19/2013   Procedure: CIRCUMCISION ADULT;  Surgeon: Lowella Bandy, MD;  Location: [facility];  Service: Urology;  Laterality: N/A;   Family History  Problem Relation Age of Onset  .  Cancer Mother        type unknown pt was 86 years old at that time  . Liver disease Father   . Healthy Brother   . Cancer Other         alot of family members have cancer but doesnt know the type   Social History  Substance Use Topics  . Smoking status: Former Smoker    Packs/day: 1.00    Years: 30.00    Types: Cigarettes  . Smokeless tobacco: Never Used     Comment: quit 3 years ago  . Alcohol use Yes     Comment: rarely   Current Outpatient Prescriptions  Medication Sig Dispense Refill  . acetaminophen (TYLENOL) 325 MG tablet Take 650 mg by mouth every 6 (six) hours as needed.    Marland Kitchen atorvastatin (LIPITOR) 40 MG tablet daily.    . carvedilol (COREG) 6.25 MG tablet Take 6.25 mg by mouth 2 (two) times daily with a meal.    . cholecalciferol (VITAMIN D) 400 units TABS tablet Take 400 Units by mouth daily.    . fenofibrate (TRICOR) 145 MG tablet Take 145 mg by mouth daily.    Marland Kitchen glipiZIDE (GLUCOTROL) 5 MG tablet 2 (two) times daily.    Marland Kitchen lisinopril (PRINIVIL,ZESTRIL) 20 MG tablet Take 20 mg by mouth every morning.    . meloxicam (MOBIC) 15 MG tablet Take by mouth as needed.  0  . metFORMIN (GLUCOPHAGE) 500 MG tablet  Take by mouth 2 (two) times daily with a meal.    . niacin (NIASPAN) 1000 MG CR tablet Take 1,000 mg by mouth at bedtime.    Marland Kitchen omeprazole (PRILOSEC) 40 MG capsule Take 40 mg by mouth every evening.    . tamsulosin (FLOMAX) 0.4 MG CAPS capsule Take 0.4 mg by mouth daily after supper.    . traMADol (ULTRAM) 50 MG tablet as needed.     Current Facility-Administered Medications  Medication Dose Route Frequency Provider Last Rate Last Dose  . 0.9 %  sodium chloride infusion  500 mL Intravenous Continuous Akshar Starnes, Renelda Loma, MD       No Known Allergies   Review of Systems: All systems reviewed and negative except where noted in HPI.   Lab Results  Component Value Date   WBC 6.8 02/03/2017   HGB 14.8 02/03/2017   HCT 43.0 02/03/2017   MCV 89.1 02/03/2017   PLT  127.0 (L) 02/03/2017    Lab Results  Component Value Date   INR 1.3 (H) 02/03/2017    Lab Results  Component Value Date   ALT 54 (H) 11/20/2016   AST 42 (H) 11/20/2016   ALKPHOS 71 11/20/2016   BILITOT 0.6 11/20/2016      Physical Exam: BP 106/60   Pulse 64   Ht 5\' 9"  (1.753 m)   Wt 250 lb (113.4 kg)   BMI 36.92 kg/m  Constitutional: Pleasant,male in no acute distress, uses sign language to communicate. HEENT: Normocephalic and atraumatic. Conjunctivae are normal. No scleral icterus. Neck supple.  Cardiovascular: Normal rate, regular rhythm.  Pulmonary/chest: Effort normal and breath sounds normal. No wheezing, rales or rhonchi. Abdominal: Soft, nondistended, protuberant, nontender. There are no masses palpable. No hepatomegaly. Extremities: no edema Lymphadenopathy: No cervical adenopathy noted. Neurological: Alert and oriented to person place and time. No asterixis. Skin: Skin is warm and dry. No rashes noted. Psychiatric: Normal mood and affect. Behavior is normal.   ASSESSMENT AND PLAN: 51 year old male here for follow-up and reassessment after recent diagnosis of cirrhosis of the liver, likely due to fatty liver disease. He has small esophageal varices, no other evidence of decompensation at this time. He has been overweight for a long time, in association with his diabetes I suspect this is likely what drove the fatty liver, versus component of alcohol as well.   I discussed what cirrhosis is with him at length, long-term risks for hepatic decompensation and HCC. He had several questions about this which we discussed. At this time recommend following: - repeat LFTs and alpha-fetoprotein today - recommend CT scan of the abdomen with contrast to reevaluate the liver given enlarged portal caval lymph node noted 5 years ago of unclear etiology - complete alcohol abstinence - avoidance of NSAIDs, stopped Mobic, he can use Tylenol as needed less than 2 g per day - weight  loss for obesity and fatty liver - recommend consultation to see nutritionist - continue Coreg which will treat esophageal varices, no surveillance EGD is needed in this light - hepatitis B vaccine today - follow-up in 6 months for reassessment  All questions answered, he agreed with the plan.  [place] Cellar, MD [facility] Pager [phone]  CC: [name], FNP

## 2017-03-04 LAB — AFP TUMOR MARKER: AFP-Tumor Marker: 2.8 ng/mL (ref <6.1)

## 2017-03-10 ENCOUNTER — Encounter (INDEPENDENT_AMBULATORY_CARE_PROVIDER_SITE_OTHER): Payer: Self-pay

## 2017-03-10 ENCOUNTER — Ambulatory Visit (INDEPENDENT_AMBULATORY_CARE_PROVIDER_SITE_OTHER)
Admission: RE | Admit: 2017-03-10 | Discharge: 2017-03-10 | Disposition: A | Discharge status: Home / Self Care | Payer: Medicare HMO | Source: Ambulatory Visit | Attending: Gastroenterology | Admitting: Gastroenterology

## 2017-03-10 DIAGNOSIS — R938 Abnormal findings on diagnostic imaging of other specified body structures: Secondary | ICD-10-CM | POA: Diagnosis not present

## 2017-03-10 DIAGNOSIS — K7460 Unspecified cirrhosis of liver: Secondary | ICD-10-CM

## 2017-03-10 DIAGNOSIS — R9389 Abnormal findings on diagnostic imaging of other specified body structures: Secondary | ICD-10-CM

## 2017-03-10 MED ORDER — IOPAMIDOL (ISOVUE-300) INJECTION 61%
100.0000 mL | Freq: Once | INTRAVENOUS | Status: AC | PRN
  Administered 2017-03-10: 100 mL via INTRAVENOUS

## 2017-03-16 ENCOUNTER — Other Ambulatory Visit: Payer: Self-pay

## 2017-03-16 ENCOUNTER — Telehealth: Payer: Self-pay | Admitting: Gastroenterology

## 2017-03-16 DIAGNOSIS — K7460 Unspecified cirrhosis of liver: Secondary | ICD-10-CM

## 2017-03-17 NOTE — Telephone Encounter (Signed)
Spoke with pts wife through Optometrist. Informed of CT results and that we will contact them when it is time to schedule a follow up u/s in 41m per Dr. Havery Moros.

## 2017-03-22 ENCOUNTER — Telehealth: Payer: Self-pay | Admitting: Gastroenterology

## 2017-03-22 NOTE — Telephone Encounter (Signed)
Diagnosis code added to referral for type II diabetes.

## 2017-03-23 ENCOUNTER — Telehealth: Payer: Self-pay

## 2017-03-23 NOTE — Telephone Encounter (Signed)
Levin Erp, [place] sent to Jeoffrey Massed, RN        Please contact Bryna Colander manager-fax [phone] and send her note saying that patient Stephen Chase 2065/12/02 needs his hep b surface Ag And Ab ordered under ICD-10 code R79.89 (Elevated LFT's), not previously listed R74.8   Thank you-JLL    I called and advised Stephen Chase of the above information.

## 2017-03-30 DIAGNOSIS — M654 Radial styloid tenosynovitis [de Quervain]: Secondary | ICD-10-CM | POA: Diagnosis not present

## 2017-05-27 DIAGNOSIS — G4733 Obstructive sleep apnea (adult) (pediatric): Secondary | ICD-10-CM | POA: Diagnosis not present

## 2017-06-03 DIAGNOSIS — I10 Essential (primary) hypertension: Secondary | ICD-10-CM | POA: Diagnosis not present

## 2017-06-03 DIAGNOSIS — E782 Mixed hyperlipidemia: Secondary | ICD-10-CM | POA: Diagnosis not present

## 2017-06-03 DIAGNOSIS — E119 Type 2 diabetes mellitus without complications: Secondary | ICD-10-CM | POA: Diagnosis not present

## 2017-06-03 DIAGNOSIS — M62830 Muscle spasm of back: Secondary | ICD-10-CM | POA: Diagnosis not present

## 2017-07-15 DIAGNOSIS — E119 Type 2 diabetes mellitus without complications: Secondary | ICD-10-CM | POA: Diagnosis not present

## 2017-07-15 DIAGNOSIS — H524 Presbyopia: Secondary | ICD-10-CM | POA: Diagnosis not present

## 2017-08-27 DIAGNOSIS — G4733 Obstructive sleep apnea (adult) (pediatric): Secondary | ICD-10-CM | POA: Diagnosis not present

## 2017-09-13 DIAGNOSIS — K0889 Other specified disorders of teeth and supporting structures: Secondary | ICD-10-CM | POA: Diagnosis not present

## 2017-10-21 DIAGNOSIS — I10 Essential (primary) hypertension: Secondary | ICD-10-CM | POA: Diagnosis not present

## 2017-10-21 DIAGNOSIS — E785 Hyperlipidemia, unspecified: Secondary | ICD-10-CM | POA: Diagnosis not present

## 2017-10-21 DIAGNOSIS — E119 Type 2 diabetes mellitus without complications: Secondary | ICD-10-CM | POA: Diagnosis not present

## 2017-10-21 DIAGNOSIS — R202 Paresthesia of skin: Secondary | ICD-10-CM | POA: Diagnosis not present

## 2017-11-26 DIAGNOSIS — G4733 Obstructive sleep apnea (adult) (pediatric): Secondary | ICD-10-CM | POA: Diagnosis not present

## 2018-02-03 DIAGNOSIS — R945 Abnormal results of liver function studies: Secondary | ICD-10-CM | POA: Diagnosis not present

## 2018-02-03 DIAGNOSIS — L259 Unspecified contact dermatitis, unspecified cause: Secondary | ICD-10-CM | POA: Diagnosis not present

## 2018-02-24 DIAGNOSIS — Z125 Encounter for screening for malignant neoplasm of prostate: Secondary | ICD-10-CM | POA: Diagnosis not present

## 2018-02-24 DIAGNOSIS — G4733 Obstructive sleep apnea (adult) (pediatric): Secondary | ICD-10-CM | POA: Diagnosis not present

## 2018-02-24 DIAGNOSIS — R7301 Impaired fasting glucose: Secondary | ICD-10-CM | POA: Diagnosis not present

## 2018-02-24 DIAGNOSIS — E785 Hyperlipidemia, unspecified: Secondary | ICD-10-CM | POA: Diagnosis not present

## 2018-02-24 DIAGNOSIS — E119 Type 2 diabetes mellitus without complications: Secondary | ICD-10-CM | POA: Diagnosis not present

## 2018-02-24 DIAGNOSIS — I10 Essential (primary) hypertension: Secondary | ICD-10-CM | POA: Diagnosis not present

## 2018-02-27 DIAGNOSIS — G4733 Obstructive sleep apnea (adult) (pediatric): Secondary | ICD-10-CM | POA: Diagnosis not present

## 2018-03-11 ENCOUNTER — Encounter: Payer: Medicare HMO | Attending: Internal Medicine | Admitting: *Deleted

## 2018-03-11 DIAGNOSIS — E118 Type 2 diabetes mellitus with unspecified complications: Secondary | ICD-10-CM | POA: Insufficient documentation

## 2018-03-11 DIAGNOSIS — Z713 Dietary counseling and surveillance: Secondary | ICD-10-CM | POA: Insufficient documentation

## 2018-03-11 DIAGNOSIS — E1165 Type 2 diabetes mellitus with hyperglycemia: Secondary | ICD-10-CM

## 2018-03-11 DIAGNOSIS — IMO0002 Reserved for concepts with insufficient information to code with codable children: Secondary | ICD-10-CM

## 2018-03-11 NOTE — Progress Notes (Signed)
Diabetes Self-Management Education  Visit Type: First/Initial  Appt. Start Time: 1115 Appt. End Time: 5003  03/11/2018  [name], identified by name and date of birth, is a 52 y.o. male with a diagnosis of Diabetes: Type 2. He is here with his wife who is also deaf and the interpretor. His wife participated in the visit in a very positive way. He works in [employer], and mows his own yard so gets exercise most every day. He states he has stopped drinking diet sodas over the last 2 weeks and he has less joint pain.   ASSESSMENT  There were no vitals taken for this visit. There is no height or weight on file to calculate BMI.  Diabetes Self-Management Education - 03/11/18 1424      Visit Information   Visit Type  First/Initial       Individualized Plan for Diabetes Self-Management Training:   Learning Objective:  Patient will have a greater understanding of diabetes self-management. Patient education plan is to attend individual and/or group sessions per assessed needs and concerns.   Plan:   Patient Instructions  Plan:  Aim for 3 Carb Choices per meal (45 grams) +/- 1 either way  Aim for 0-2 Carbs per snack if hungry  Include protein in moderation with your meals and snacks Continue with your activity level daily as tolerated Consider checking BG at alternate times per day  Consider taking medication as directed by MD  Expected Outcomes:  Demonstrated interest in learning. Expect positive outcomes  Education material provided: ADA Diabetes: Your Take Control Guide, A1C conversion sheet, Meal plan card, Snack sheet and Carbohydrate counting sheet  If problems or questions, patient to contact team via:  Phone  Future DSME appointment: 4-6 wks

## 2018-03-11 NOTE — Patient Instructions (Signed)
Plan:  Aim for 3 Carb Choices per meal (45 grams) +/- 1 either way  Aim for 0-2 Carbs per snack if hungry  Include protein in moderation with your meals and snacks Continue with your activity level daily as tolerated Consider checking BG at alternate times per day  Consider taking medication as directed by MD

## 2018-03-22 DIAGNOSIS — H6692 Otitis media, unspecified, left ear: Secondary | ICD-10-CM | POA: Diagnosis not present

## 2018-03-22 DIAGNOSIS — H913 Deaf nonspeaking, not elsewhere classified: Secondary | ICD-10-CM | POA: Diagnosis not present

## 2018-03-22 DIAGNOSIS — H6092 Unspecified otitis externa, left ear: Secondary | ICD-10-CM | POA: Diagnosis not present

## 2018-05-05 ENCOUNTER — Encounter: Payer: Self-pay | Admitting: Neurology

## 2018-05-09 ENCOUNTER — Ambulatory Visit: Payer: Medicare HMO | Admitting: Neurology

## 2018-05-09 ENCOUNTER — Encounter: Payer: Self-pay | Admitting: Neurology

## 2018-05-09 ENCOUNTER — Other Ambulatory Visit: Payer: Self-pay | Admitting: Neurology

## 2018-05-09 VITALS — BP 106/56 | HR 60 | Ht 69.0 in | Wt 254.0 lb

## 2018-05-09 DIAGNOSIS — E1165 Type 2 diabetes mellitus with hyperglycemia: Secondary | ICD-10-CM | POA: Diagnosis not present

## 2018-05-09 DIAGNOSIS — G4733 Obstructive sleep apnea (adult) (pediatric): Secondary | ICD-10-CM

## 2018-05-09 DIAGNOSIS — Z9989 Dependence on other enabling machines and devices: Secondary | ICD-10-CM | POA: Diagnosis not present

## 2018-05-09 DIAGNOSIS — H9193 Unspecified hearing loss, bilateral: Secondary | ICD-10-CM | POA: Insufficient documentation

## 2018-05-09 DIAGNOSIS — E1140 Type 2 diabetes mellitus with diabetic neuropathy, unspecified: Secondary | ICD-10-CM

## 2018-05-09 DIAGNOSIS — I8500 Esophageal varices without bleeding: Secondary | ICD-10-CM

## 2018-05-09 DIAGNOSIS — G4710 Hypersomnia, unspecified: Secondary | ICD-10-CM | POA: Diagnosis not present

## 2018-05-09 HISTORY — DX: Esophageal varices without bleeding: I85.00

## 2018-05-09 MED ORDER — ARMODAFINIL 200 MG PO TABS: 100.0000 mg | ORAL_TABLET | Freq: Every day | ORAL | 5 refills | Status: DC

## 2018-05-09 MED ORDER — MODAFINIL 200 MG PO TABS: ORAL_TABLET | ORAL | 5 refills | Status: DC

## 2018-05-09 NOTE — Patient Instructions (Addendum)
Hypersomnia Hypersomnia is when you feel extremely tired during the day even though you're getting plenty of sleep at night. You may need to take naps during the day, and you may also be extremely difficult to wake up when you are sleeping. What are the causes? The cause of your hypersomnia may not be known. Hypersomnia may be caused by:  Medicines.  Sleep disorders, such as narcolepsy.  Trauma or injury to your head or nervous system.  Using drugs or alcohol.  Tumors.  Medical conditions, such as depression or hypothyroidism.  Genetics.  What are the signs or symptoms? The main symptoms of hypersomnia include:  Feeling extremely tired throughout the day.  Being very difficult to wake up.  Sleeping for longer and longer periods.  Taking naps throughout the day.  Other symptoms may include:  Feeling: ? Restless. ? Annoyed. ? Anxious. ? Low energy.  Having difficulty: ? Remembering. ? Speaking. ? Thinking.  Losing your appetite.  Experiencing hallucinations.  How is this diagnosed? Hypersomnia may be diagnosed by:  Medical history and physical exam. This will include a sleep history.  Completing sleep logs.  Tests may also be done, such as: ? Polysomnography. ? Multiple sleep latency test (MSLT).  How is this treated? There is no cure for hypersomnia, but treatment can be very effective in helping manage the condition. Treatment may include:  Lifestyle and sleeping strategies to help cope with the condition.  Stimulant medicines.  Treating any underlying causes of hypersomnia.  Follow these instructions at home:  Take medicines only as directed by your health care provider.  Schedule short naps for when you feel sleepiest during the day. Tell your employer or teachers that you have hypersomnia. You may be able to adjust your schedule to include time for naps.  Avoid drinking alcohol or caffeinated beverages.  Do not eat a heavy meal before  bedtime. Eat at about the same times every day.  Do not drive or operate heavy machinery if you are sleepy.  Do not swim or go out on the water without a life jacket.  If possible, adjust your schedule so that you do not have to work or be active at night.  Keep all follow-up visits as directed by your health care provider. This is important. Contact a health care provider if:  You have new symptoms.  Your symptoms get worse. Get help right away if: You have serious thoughts of hurting yourself or someone else. This information is not intended to replace advice given to you by your health care provider. Make sure you discuss any questions you have with your health care provider. Document Released: 08/28/2002 Document Revised: 02/13/2016 Document Reviewed: 04/12/2014 Elsevier Interactive Patient Education  2018 [place].   HLA narcolepsy test Modafinil tablets What is this medicine? MODAFINIL (moe DAF i nil) is used to treat excessive sleepiness caused by certain sleep disorders. This includes narcolepsy, sleep apnea, and shift work sleep disorder. This medicine may be used for other purposes; ask your health care provider or pharmacist if you have questions. COMMON BRAND NAME(S): Provigil What should I tell my health care provider before I take this medicine? They need to know if you have any of these conditions: -history of depression, mania, or other mental disorder -kidney disease -liver disease -an unusual or allergic reaction to modafinil, other medicines, foods, dyes, or preservatives -pregnant or trying to get pregnant -breast-feeding How should I use this medicine? Take this medicine by mouth with a glass of  water. Follow the directions on the prescription label. Take your doses at regular intervals. Do not take your medicine more often than directed. Do not stop taking except on your doctor's advice. A special MedGuide will be given to you by the pharmacist with each  prescription and refill. Be sure to read this information carefully each time. Talk to your pediatrician regarding the use of this medicine in children. This medicine is not approved for use in children. Overdosage: If you think you have taken too much of this medicine contact a poison control center or emergency room at once. NOTE: This medicine is only for you. Do not share this medicine with others. What if I miss a dose? If you miss a dose, take it as soon as you can. If it is almost time for your next dose, take only that dose. Do not take double or extra doses. What may interact with this medicine? Do not take this medicine with any of the following medications: -amphetamine or dextroamphetamine -dexmethylphenidate or methylphenidate -medicines called MAO Inhibitors like Nardil, Parnate, Marplan, Eldepryl -pemoline -procarbazine This medicine may also interact with the following medications: -antifungal medicines like itraconazole or ketoconazole -barbiturates like phenobarbital -birth control pills or other hormone-containing birth control devices or implants -carbamazepine -cyclosporine -diazepam -medicines for depression, anxiety, or psychotic disturbances -phenytoin -propranolol -triazolam -warfarin This list may not describe all possible interactions. Give your health care provider a list of all the medicines, herbs, non-prescription drugs, or dietary supplements you use. Also tell them if you smoke, drink alcohol, or use illegal drugs. Some items may interact with your medicine. What should I watch for while using this medicine? Visit your doctor or health care professional for regular checks on your progress. The full effects of this medicine may not be seen right away. This medicine may affect your concentration, function, or may hide signs that you are tired. You may get dizzy. Do not drive, use machinery, or do anything that needs mental alertness until you know how this  drug affects you. Alcohol can make you more dizzy and may interfere with your response to this medicine or your alertness. Avoid alcoholic drinks. Birth control pills may not work properly while you are taking this medicine. Talk to your doctor about using an extra method of birth control. It is unknown if the effects of this medicine will be increased by the use of caffeine. Caffeine is available in many foods, beverages, and medications. Ask your doctor if you should limit or change your intake of caffeine-containing products while on this medicine. What side effects may I notice from receiving this medicine? Side effects that you should report to your doctor or health care professional as soon as possible: -allergic reactions like skin rash, itching or hives, swelling of the face, lips, or tongue -anxiety -breathing problems -chest pain -fast, irregular heartbeat -hallucinations -increased blood pressure -redness, blistering, peeling or loosening of the skin, including inside the mouth -sore throat, fever, or chills -suicidal thoughts or other mood changes -tremors -vomiting Side effects that usually do not require medical attention (report to your doctor or health care professional if they continue or are bothersome): -headache -nausea, diarrhea, or stomach upset -nervousness -trouble sleeping This list may not describe all possible side effects. Call your doctor for medical advice about side effects. You may report side effects to FDA at 1-800-FDA-1088. Where should I keep my medicine? Keep out of the reach of children. This medicine can be abused. Keep your medicine in  a safe place to protect it from theft. Do not share this medicine with anyone. Selling or giving away this medicine is dangerous and against the law. This medicine may cause accidental overdose and death if taken by other adults, children, or pets. Mix any unused medicine with a substance like cat litter or coffee  grounds. Then throw the medicine away in a sealed container like a sealed bag or a coffee can with a lid. Do not use the medicine after the expiration date. Store at room temperature between 20 and 25 degrees C (68 and 77 degrees F). NOTE: This sheet is a summary. It may not cover all possible information. If you have questions about this medicine, talk to your doctor, pharmacist, or health care provider.  2018 Elsevier/Gold Standard (2014-05-29 15:34:55)

## 2018-05-09 NOTE — Progress Notes (Signed)
SLEEP MEDICINE CLINIC   Provider:  Larey Seat, M D  Primary Care Physician:  [name], FNP at [facility]    Referring Provider: [name], FNP    Chief Complaint  Patient presents with  . New Patient (Initial Visit)    pt alone, with a intepretor pt is with him. pt deaf. pt uses a a CPAP machine. pt is here to establish care with machine but also states that he wakes up during the night and is unable to go back to sleep. DME Huey Romans    HPI:  Stephen Chase is a 52 y.o. male , seen here as in a referral  from [place] for a re- evaluation of sleep apnea,in the presence of his sign Ecologist.   I have the pleasure of meeting [name] today, a 52 year old male patient of the [facility] with a history of deafness since age 53 after infection, chronically elevated liver enzymes in the setting of fatty liver disease, HTN,  and diabetes mellitus with a recent hemoglobin A1c 8.9, elevated triglycerides, normal TSH, T4 and uptake. He is on multiple medications for HTN and oral medication for DM.  He remains morbidly obese, and is using CPAP in the treatment of OSA. He was diagnosed at [facility] and given a CPAP but his sleep study is not on Epic-  May have been more  than 10 years ago.  He is using the second machine ever issued to him now, replaced by [employer]. This is a very well cared for machine. He uses it compliantly - he reports having excessive daytime sleepiness.    Chief complaint according to patient : EDS- Epworth sleepiness score of 15/ 24 points. he reports easily falling asleep easily when not stimulated or physically active. EDS- Ever since he was a child. He slept in school. His brother would wake him because he snored loudly before he turned 15.  His CPAP has improved his alertness but he sleeps only 4-5 hours each night and feels sleep deprived. His use of CPAP has not given him that much of a boost in  terms of EDS.    Sleep habits are as follows: Dinner time for this patient is 6-7 PM, and he later has a night time snack. he spends the hours after dinner watching TV - he is very sedate, and easily falling asleep. He goes to bed around 11- midnight , if he goes earlier he wont be sleeping, in spite of sleeping on his couch. His bedroom is always quiet for him, but it is dark and cool. No TV watching in the bedroom.  He sometimes struggles with going to sleep, other nights he is promptly asleep. He will sleep until his first nocturia- 4-5 hours later and often stays awake for the remainder of the night. Nocturia came on with DM, he reports no vivid dreams, no nightmares.   He is not watching Tv in AM- rises at 6.30 AM and depending on his work demands (self employed). He feels initially refreshed but gets tired again soon . Has a substantial breakfast each morning.  No headaches, no chest pain. Naps are 15 minutes - max 30 minimal.    Sleep medical history and family sleep history: OSA in brother.   Social history: married, self employed, grown children, 2 daughters aged 3 and 23 . Quit 5 years ago, was a cigarette smoker of 1 ppd. ETOH-used to drink  beer, cut  down to help liver function, now 1-2 a week max. 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   Review of Systems: Out of a complete 14 system review, the patient complains of only the following symptoms, and all other reviewed systems are negative. Snoring before CPAP, apnea before CPAP, wife is deaf as well- no witnesses.   Epworth score 15/ 24  , Fatigue severity score n/a   , depression score n/a   Social History   Socioeconomic History  . Marital status: Married    Spouse name: Not on file  . Number of children: 2  . Years of education: Not on file  . Highest education level: Not  on file  Occupational History  . Occupation: unemployed  Social Needs  . Financial resource strain: Not on file  . Food insecurity:    Worry: Not on file    Inability: Not on file  . Transportation needs:    Medical: Not on file    Non-medical: Not on file  Tobacco Use  . Smoking status: Former Smoker    Packs/day: 1.00    Years: 30.00    Pack years: 30.00    Types: Cigarettes  . Smokeless tobacco: Never Used  . Tobacco comment: quit 4 years ago  Substance and Sexual Activity  . Alcohol use: Yes    Comment: rarely  . Drug use: No  . Sexual activity: Not on file  Lifestyle  . Physical activity:    Days per week: Not on file    Minutes per session: Not on file  . Stress: Not on file  Relationships  . Social connections:    Talks on phone: Not on file    Gets together: Not on file    Attends religious service: Not on file    Active member of club or organization: Not on file    Attends meetings of clubs or organizations: Not on file    Relationship status: Not on file  . Intimate partner violence:    Fear of current or ex partner: Not on file    Emotionally abused: Not on file    Physically abused: Not on file    Forced sexual activity: Not on file  Other Topics Concern  . Not on file  Social History Narrative  . Not on file    Family History  Problem Relation Age of Onset  . Cancer Mother        type unknown pt was 48 years old at that time  . Liver disease Father   . Healthy Brother   . Cancer Other         alot of family members have cancer but doesnt know the type    Past Medical History:  Diagnosis Date  . Arthritis    SHOULDERS  . Balanitis   . Deafness, sensorineural, childhood onset    BILATERAL SINCE AGE 84  SECONDARY TO UNKNOWN ILLNESS  . GERD (gastroesophageal reflux disease)   . Hyperlipidemia   . Hypertension   . OSA on CPAP    SEVERE PER STUDY 03-09-2009  . Sleep apnea    uses a cpap  . Smokers' cough ([place])   . Type 2 diabetes  mellitus ([place])     Past Surgical History:  Procedure Laterality Date  .  CIRCUMCISION N/A 12/19/2013   Procedure: CIRCUMCISION ADULT;  Surgeon: Lowella Bandy, MD;  Location: [facility];  Service: Urology;  Laterality: N/A;    Current Outpatient Medications  Medication Sig Dispense Refill  . acetaminophen (TYLENOL) 325 MG tablet Take 650 mg by mouth every 6 (six) hours as needed.    Marland Kitchen aspirin EC 81 MG tablet Take 81 mg by mouth daily.    Marland Kitchen atorvastatin (LIPITOR) 40 MG tablet daily.    . carvedilol (COREG) 6.25 MG tablet Take 6.25 mg by mouth 2 (two) times daily with a meal.    . fenofibrate (TRICOR) 145 MG tablet Take 145 mg by mouth daily.    Marland Kitchen glipiZIDE (GLUCOTROL) 5 MG tablet 2 (two) times daily.    Marland Kitchen lisinopril (PRINIVIL,ZESTRIL) 20 MG tablet Take 20 mg by mouth every morning.    Marland Kitchen MAGNESIUM CARBONATE PO Take 250 mg by mouth.    . metFORMIN (GLUCOPHAGE) 500 MG tablet Take by mouth 2 (two) times daily with a meal.    . Multiple Vitamins-Minerals (MULTIVITAMIN WITH MINERALS) tablet Take 1 tablet by mouth daily.    . Niacin, Antihyperlipidemic, (NIACIN ER, ANTIHYPERLIPIDEMIC, PO) Take 2,000 mg by mouth at bedtime.     Marland Kitchen omeprazole (PRILOSEC) 10 MG capsule TK 1 C PO QD BEFORE MEAL  0  . tamsulosin (FLOMAX) 0.4 MG CAPS capsule Take 0.4 mg by mouth daily after supper.     Current Facility-Administered Medications  Medication Dose Route Frequency Provider Last Rate Last Dose  . 0.9 %  sodium chloride infusion  500 mL Intravenous Continuous Armbruster, Carlota Raspberry, MD        Allergies as of 05/09/2018  . (No Known Allergies)   I reviewed labs form [facility]- has elevated AST and ALT, low HDL. He has a history of chronic elevation in ALT and fatty liver. Since his last visit he underwent an upper endoscopy for his symptoms of dysphagia. EGD showed no pathology for his dysphagia but was remarkable for small esophageal varices, raising suspicion for underlying  cirrhosis. He underwent extensive serologic testing which was negative for chronic liver diseases. He was not immune to hepatitis B on this workup. CBC showed thrombocytopenia and INR of 1.3. Of note on remote CT scan in 2012 he had a 3cm portocaval lymph node of unclear significance. He does endorse a history of binge drinking in his 30-40s but was mostly only on the weekends, he denies any daily use of alcohol. He does have DM and is overweight, with history of fatty liver noted on CT scan in 2012. He is taking Coreg already for HTN. He endorses being prescribed mobic to use daily for pain, he denies other NSAID use.   CPAP download on 05-09-18, Mr. Thames used his CPAP 87% of the last 30 days with an average use at time of 5 hours and 31 minutes, he is using an AutoSet capable CPAP which is set at a very high pressure of 19 cmH2O with 3 cm EPR.  He does not have major air leaks using a full facemask by [employer].  His AHI is 1.3, this means the index of apneas per hour of sleep is very low and speaks for good control of his underlying apnea.  Unfortunately I do not know his baseline apnea when he is untreated.       Vitals: BP (!) 106/56   Pulse 60   Ht '5\' 9"'$  (1.753 m)   Wt 254 lb (115.2 kg)  BMI 37.51 kg/m  Last Weight:  Wt Readings from Last 1 Encounters:  05/09/18 254 lb (115.2 kg)   BFX:OVAN mass index is 37.51 kg/m.     Last Height:   Ht Readings from Last 1 Encounters:  05/09/18 '5\' 9"'$  (1.753 m)    Physical exam:  General: The patient is awake, alert and appears not in acute distress. The patient is well groomed. Head: Normocephalic, atraumatic. Neck is supple. Mallampati 5,  neck circumference:21 " . Nasal airflow is patent ,  Retrognathia is not seen.  Cardiovascular:  Regular rate and rhythm , without  murmurs or carotid bruit, and without distended neck veins. Respiratory: Lungs are clear to auscultation. Skin:  Without evidence of edema, or rash Trunk: BMI is 37. 5 . The  patient's posture is erect.   Neurologic exam : The patient is awake and alert, oriented to place and time.   Memory subjective described as intact.   Attention span & concentration ability appears normal.  Speech is absent , Mood and affect are appropriate.  Cranial nerves: Pupils are equal and briskly reactive to light. Funduscopic exam without   evidence of pallor or edema. Extraocular movements  in vertical and horizontal planes intact and without nystagmus. Visual fields by finger perimetry are intact. Hearing - the patient is deaf.  Facial sensation intact to fine touch.  Facial motor strength is symmetric and tongue and uvula is not visible . Shoulder shrug was symmetrical.   Motor exam:   Normal tone, muscle bulk and symmetric strength in all extremities. Sensory:  Fine touch, pinprick and vibration were tested in all extremities. Proprioception tested in the upper extremities was normal. Coordination: Rapid alternating movements in the fingers/hands was normal. Finger-to-nose maneuver  normal without evidence of ataxia, dysmetria or tremor. Gait and station: Patient walks without assistive device - Turns with 3 Steps. .  Deep tendon reflexes: in the  upper and lower extremities are attenuated - but symmetric. Babinski maneuver response is downgoing.  Assessment:  After physical and neurologic examination, review of laboratory studies,  Personal review of imaging studies, reports of other /same  Imaging studies, results of polysomnography and / or neurophysiology testing and pre-existing records as far as provided in visit., my assessment is   1)   2)  3)   The patient was advised of the nature of the diagnosed disorder , the treatment options and the  risks for general health and wellness arising from not treating the condition.   I spent more than 55 minutes of face to face time with the patient, due to translation services and complications in obtaining other records related   to sleep apnea testing in the past.  Greater than 50% of time was spent in counseling and coordination of care. We have discussed the diagnosis and differential and I answered the patient's questions.    Plan:  Treatment plan and additional workup :  HST to confirm severity of apnea and see why is remains excessively daytime sleepy while compliant on CPAP.  What wakes him up? Marland Kitchen He is reporting that every night is different, but likes the idea of sleeping at home and not needing a translation specialist.    HLA testing for narcolepsy  Armodafinil  200 mg ( 100 mg to 200 mg) for OSA patient compliant with CPAP at 19 cm water , and remaining hypersomnic.    Larey Seat, MD 1/91/6606, 0:04 AM  Certified in Neurology by ABPN Certified in Sleep Medicine by  [facility]  [facility] [address], [place],  [address]

## 2018-05-10 ENCOUNTER — Telehealth: Payer: Self-pay | Admitting: Neurology

## 2018-05-10 NOTE — Telephone Encounter (Signed)
Pa completed for the patient through cover my meds/ [employer]. Modafinil was preferred medication to attempt first so order was changed from armodafinil to modafinil. PA completed. KEY : [id number]. Will wait for response from insurance, can take up to 72 hours

## 2018-05-11 NOTE — Telephone Encounter (Signed)
Modafinil is approved for the patient through [place] until 05/10/2020 [facility]

## 2018-05-13 LAB — NARCOLEPSY EVALUATION
DQB1*06:02: NEGATIVE
HLA-DQ ALPHA: NEGATIVE

## 2018-05-16 ENCOUNTER — Telehealth: Payer: Self-pay | Admitting: Neurology

## 2018-05-16 NOTE — Telephone Encounter (Signed)
Called the patient and the phone goes through an interpreter. LVM through the interpretor line for the patient to call back,  If patient calls back or wife please advise that the lab work completed to assess if the patient carried the narcolepsy gene came back negative. It doesn't mean that he doesn't have narcolepsy it just means that the patient doesn't carry the gene. Pt should still proceed forward with having the Home sleep test completed and the sleep lab should be in contact to get that scheduled.

## 2018-05-16 NOTE — Telephone Encounter (Signed)
-----  Message from Larey Seat, MD sent at 05/14/2018  3:35 PM EDT ----- Genetic factors / HLA alleles are negative for narcolepsy.   Cc [name], NP

## 2018-05-26 ENCOUNTER — Telehealth: Payer: Self-pay

## 2018-05-26 DIAGNOSIS — K7460 Unspecified cirrhosis of liver: Secondary | ICD-10-CM

## 2018-05-26 NOTE — Telephone Encounter (Signed)
Thanks Jan 

## 2018-05-26 NOTE — Telephone Encounter (Signed)
Called and spoke to wife via a "call relay" as the patient and the wife are deaf.  We scheduled an OV in late October with Dr. Havery Moros and an abdominal ultrasound at [facility]  on Tuesday, 06-21-18 at 8:30am, arrive at 8:15am. NPO 6 hours. Confirmed patient's chart shows that he requires a sign language interpretor for all visits. Patient notified and confirmed.

## 2018-05-26 NOTE — Telephone Encounter (Signed)
-----   Message from Yetta Flock, MD sent at 05/25/2018  5:47 PM EDT ----- Regarding: follow up Jan this patient is overdue for a follow up visit and RUQ [place] for cirrhosis. Can you help coordinate and book him a follow up? Thanks

## 2018-05-26 NOTE — Telephone Encounter (Signed)
Called pt.  Spoke to wife

## 2018-05-30 DIAGNOSIS — G4733 Obstructive sleep apnea (adult) (pediatric): Secondary | ICD-10-CM | POA: Diagnosis not present

## 2018-05-30 NOTE — Telephone Encounter (Signed)
Pt returning RNs call, advised what the RN mentioned. Then transferred call to [place] in Sleep lab to schedule appt

## 2018-06-08 ENCOUNTER — Ambulatory Visit (INDEPENDENT_AMBULATORY_CARE_PROVIDER_SITE_OTHER): Payer: Medicare HMO | Admitting: Neurology

## 2018-06-08 DIAGNOSIS — H9193 Unspecified hearing loss, bilateral: Secondary | ICD-10-CM

## 2018-06-08 DIAGNOSIS — G4710 Hypersomnia, unspecified: Secondary | ICD-10-CM

## 2018-06-08 DIAGNOSIS — G4733 Obstructive sleep apnea (adult) (pediatric): Secondary | ICD-10-CM | POA: Diagnosis not present

## 2018-06-08 DIAGNOSIS — Z9989 Dependence on other enabling machines and devices: Secondary | ICD-10-CM

## 2018-06-15 ENCOUNTER — Telehealth: Payer: Self-pay | Admitting: Neurology

## 2018-06-15 ENCOUNTER — Other Ambulatory Visit: Payer: Self-pay | Admitting: Neurology

## 2018-06-15 DIAGNOSIS — Z9989 Dependence on other enabling machines and devices: Principal | ICD-10-CM

## 2018-06-15 DIAGNOSIS — G4733 Obstructive sleep apnea (adult) (pediatric): Secondary | ICD-10-CM

## 2018-06-15 NOTE — Procedures (Signed)
NAME:  Stephen Chase                                                                      DOB: [birth year] MEDICAL RECORD No: [id number]                                               DOS: 06/08/2018  REFERRING PHYSICIAN: [name], FNP STUDY PERFORMED: Home Sleep Study on watch pat  HISTORY:  [name] is a deaf 52 y.o. male patient and is seen on 05-09-2018 for a re- evaluation of sleep apnea, in the presence of his sign Ecologist.  Has a history of deafness since age 31 after an infection, chronically elevated liver enzymes in the setting of fatty liver disease, HTN,  and diabetes mellitus with a recent hemoglobin A1c 8.9, elevated triglycerides, normal TSH, T4 and uptake. He is on multiple medications for HTN and oral medication for DM. He remains morbidly obese, and is using CPAP in the treatment of OSA. He was diagnosed at the [facility] and given a CPAP but his sleep study is not on Epic- May have been more than 10 years ago.  He is using the second machine ever issued to him now, replaced by [employer]. This is a very well cared for machine. He uses it compliantly - he reports having excessive daytime sleepiness nonetheless.   EDS- Epworth sleepiness score of 15/ 24 points. His brother would wake him because he snored loudly before he turned 33.  His CPAP has improved his alertness but he sleeps only 4-5 hours each night and feels sleep deprived. BMI: 37.1   STUDY RESULTS:  Total Recording Time: 6 hours 44 minutes, valid test time 5 h 25 min.  Total Apnea/Hypopnea Index (AHI):16.6 /h; RDI: 16.8 /h Average Oxygen Saturation:  95%; Lowest Oxygen Desaturation: 89 %  Total Time in Oxygen Saturation below 89 %:  0.0 minutes  Average Heart Rate:  56 bpm (between 40 and 92 bpm) IMPRESSION: Mild to moderate Obstructive Sleep Apnea, without associated hypoxemia, cardiac arrhythmia or loud snoring. Bradycardia was noted.  RECOMMENDATION: I like to continue CPAP treatment with an  auto-titration device, CPAP window between 6-16 cm water, 3 cm EPR, heated humidity and mask of patient's choice and comfort.  We will order a narcolepsy evaluation if the EDS (excessive daytime sleepiness) persists after successful OSA treatment. RV in 60-90 days.  I certify that I have reviewed the raw data recording prior to the issuance of this report in accordance with the standards of the American Academy of Sleep Medicine (AASM). Larey Seat, M.D.   06-15-2018    Medical Director of [facility] at [facility], accredited by the AASM. Diplomat of the ABPN and ABSM.

## 2018-06-15 NOTE — Telephone Encounter (Signed)
Called the patient through the interpreter answering service. Went over the sleep study results with the patient. Advised the patient that we will send orders to [employer] to allow the changes to be made to his machine. The machine will be changed to auto cpap 6-19 cm water pressure since the current machine was set at 19 cm. Patient inquired if he should continue taking the medication. I informed him yes since he is still complaining of daytime sleepiness. The patient has only been using the medication 1/2 tablet of modafinil daily. Advised the patient to try increasing to full tablet to see if he notices if it helps him. Went over side effects of the medication and advised not to take with caffeine. Scheduled the patient jan 8 10:30 am. Encouraged the patient to contact [employer] if he has not heard from them in a couple days to ensure the adjustment changes are made. Pt verbalized understanding. Pt was appreciative for the call. Advised the patient that at the follow up if sleepiness is still described then she will possibly complete work up to assess for narcolepsy. Pt verbalized understanding.

## 2018-06-15 NOTE — Telephone Encounter (Signed)
-----   Message from Larey Seat, MD sent at 06/15/2018  2:16 PM EDT ----- IMPRESSION: Mild to moderate Obstructive Sleep Apnea, without  associated hypoxemia, cardiac arrhythmia or loud snoring.  Bradycardia was noted.   RECOMMENDATION: I like to continue CPAP treatment with an  auto-titration device, CPAP window between 6-16 cm water, 3 cm  EPR, heated humidity and mask of patient's choice and comfort.  We will order a narcolepsy evaluation if the EDS (excessive  daytime sleepiness) persists after successful OSA treatment. RV  in 60-90 days.

## 2018-06-15 NOTE — Addendum Note (Signed)
Addended by: Larey Seat on: 06/15/2018 02:16 PM   Modules accepted: Orders

## 2018-06-16 DIAGNOSIS — G4730 Sleep apnea, unspecified: Secondary | ICD-10-CM | POA: Diagnosis not present

## 2018-06-16 DIAGNOSIS — I10 Essential (primary) hypertension: Secondary | ICD-10-CM | POA: Diagnosis not present

## 2018-06-16 DIAGNOSIS — E119 Type 2 diabetes mellitus without complications: Secondary | ICD-10-CM | POA: Diagnosis not present

## 2018-06-16 DIAGNOSIS — E782 Mixed hyperlipidemia: Secondary | ICD-10-CM | POA: Diagnosis not present

## 2018-06-21 ENCOUNTER — Encounter: Payer: Self-pay | Admitting: Gastroenterology

## 2018-06-21 ENCOUNTER — Ambulatory Visit (HOSPITAL_COMMUNITY)
Admission: RE | Admit: 2018-06-21 | Discharge: 2018-06-21 | Disposition: A | Discharge status: Home / Self Care | Payer: Medicare HMO | Source: Ambulatory Visit | Attending: Gastroenterology | Admitting: Gastroenterology

## 2018-06-21 DIAGNOSIS — K7460 Unspecified cirrhosis of liver: Secondary | ICD-10-CM | POA: Diagnosis not present

## 2018-06-21 DIAGNOSIS — K7689 Other specified diseases of liver: Secondary | ICD-10-CM | POA: Diagnosis not present

## 2018-06-28 IMAGING — US US ABDOMEN LIMITED
1 series · 14 of 25 positions shown · non-contrast
Comparison: CT 04/08/2011 .

CLINICAL DATA: Elevated liver enzymes.

EXAM:
US ABDOMEN LIMITED - RIGHT UPPER QUADRANT

[Series 1: us abdomen limited · 0.24mm/px · 14 of 85 slices shown]
[im 1/85]
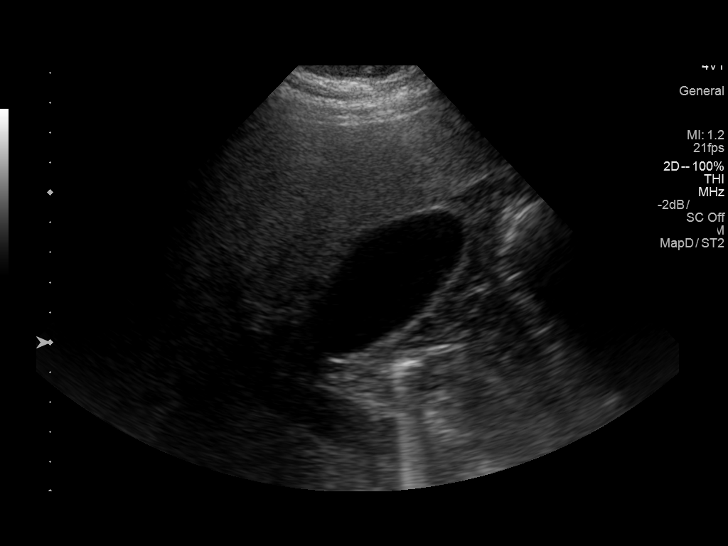
[im 8/85]
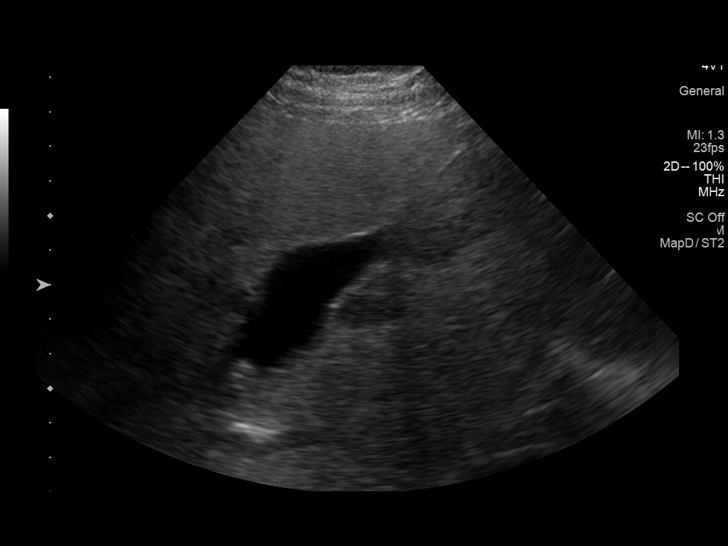
[im 15/85]
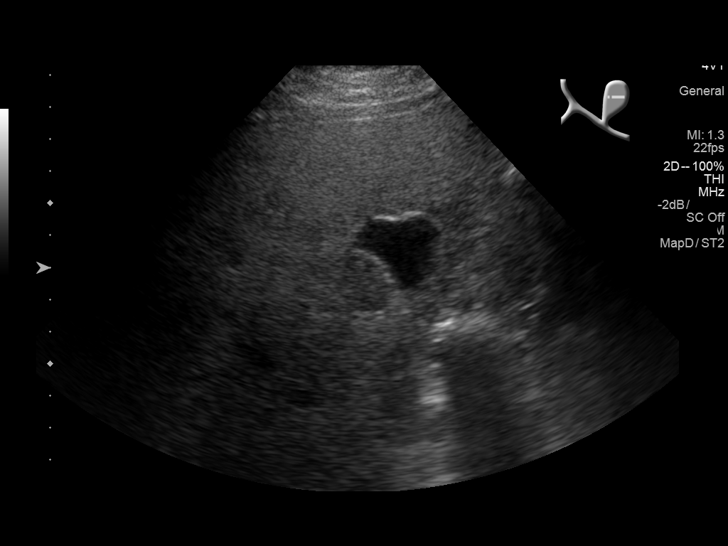
[im 22/85]
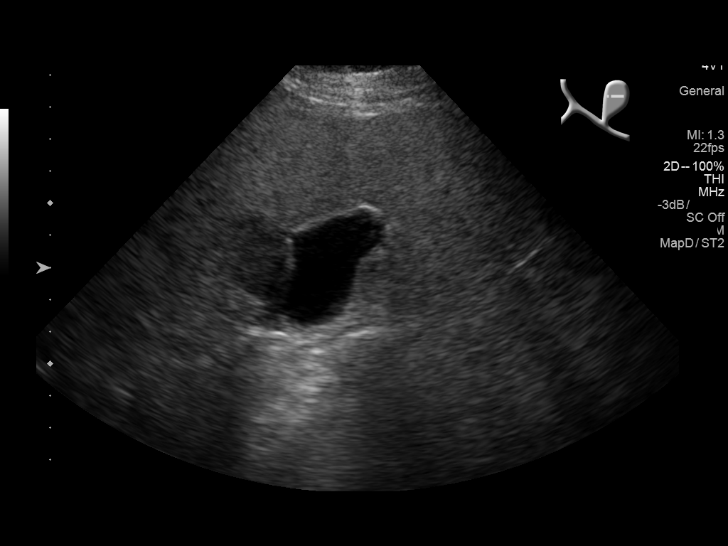
[im 29/85]
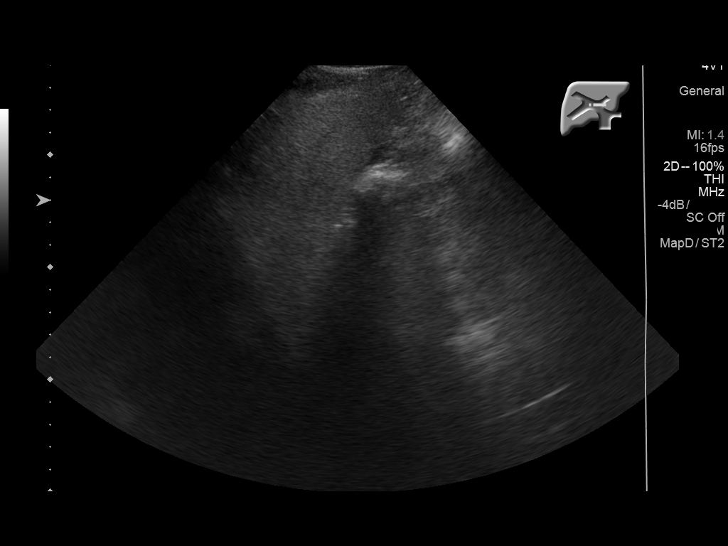
[im 32/85]
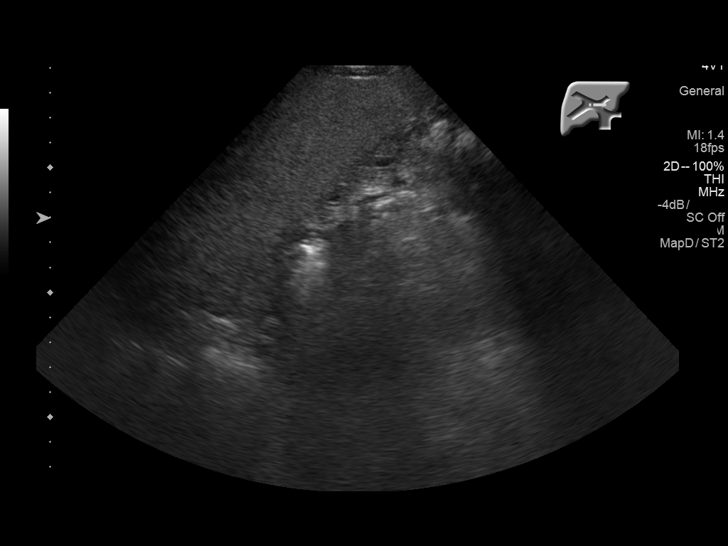
[im 39/85]
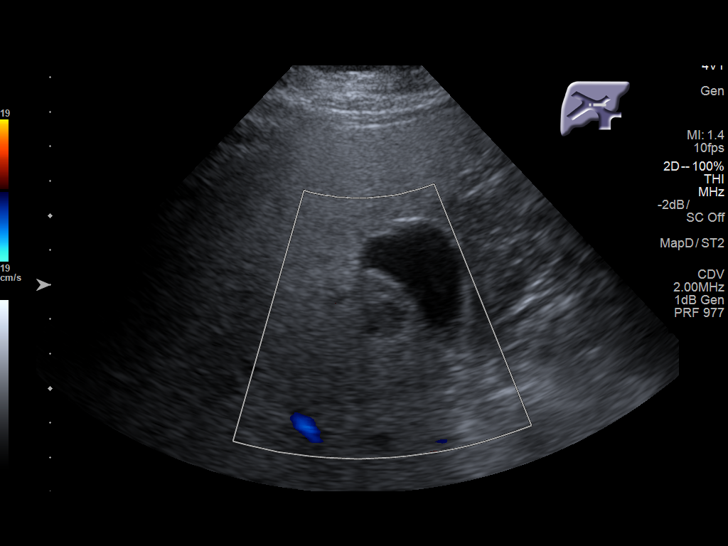
[im 46/85]
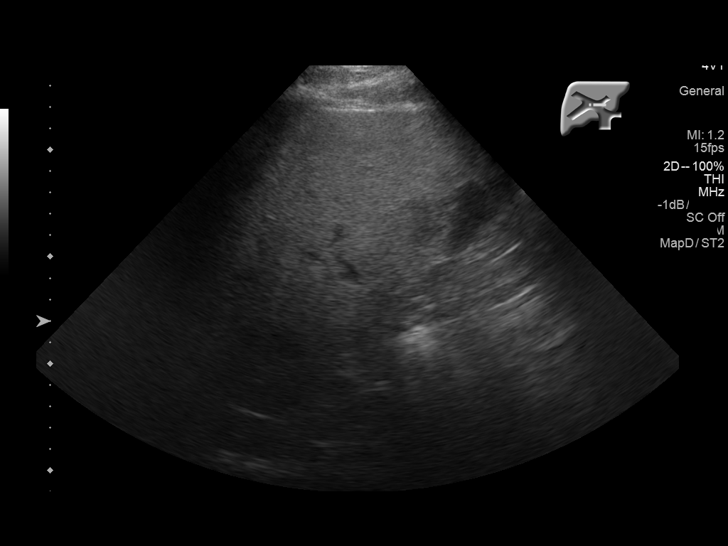
[im 53/85]
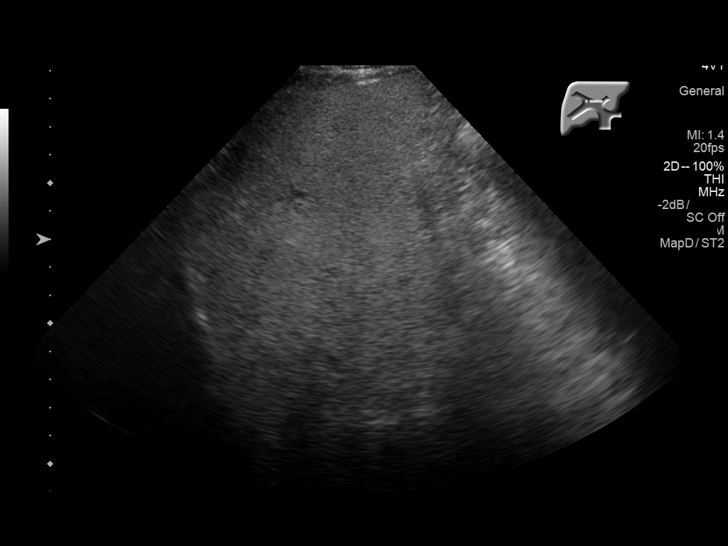
[im 57/85]
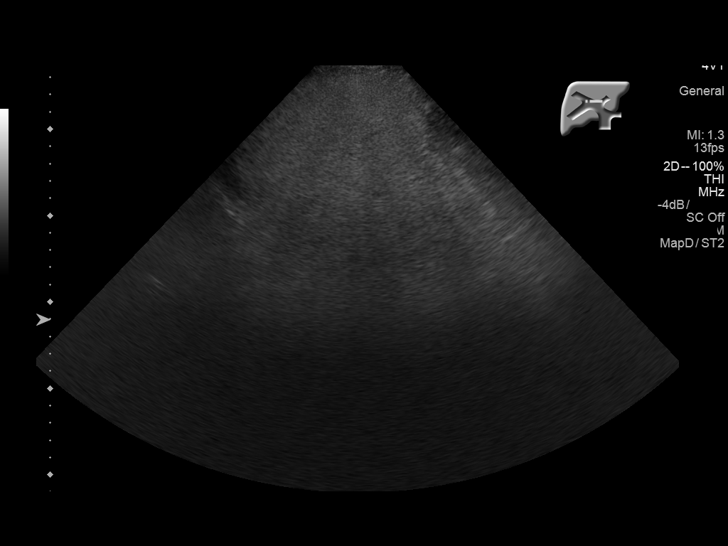
[im 64/85]
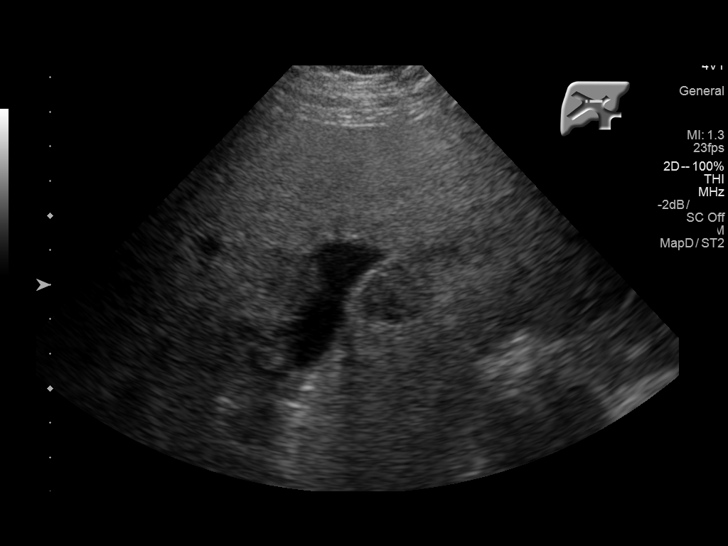
[im 71/85]
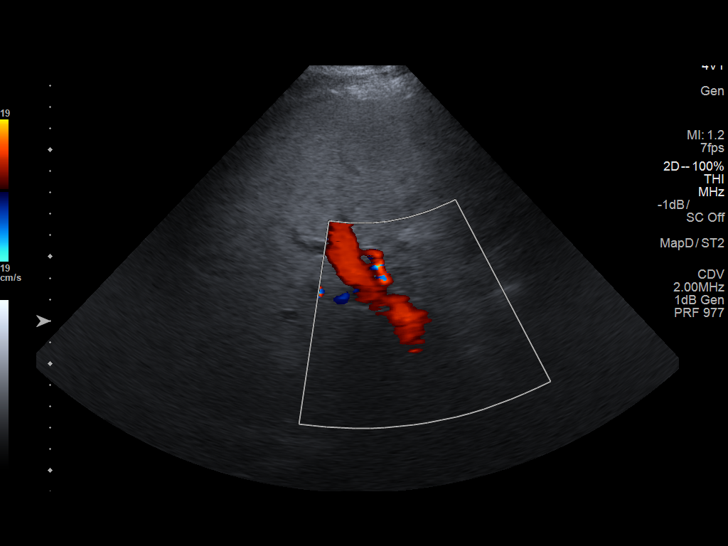
[im 78/85]
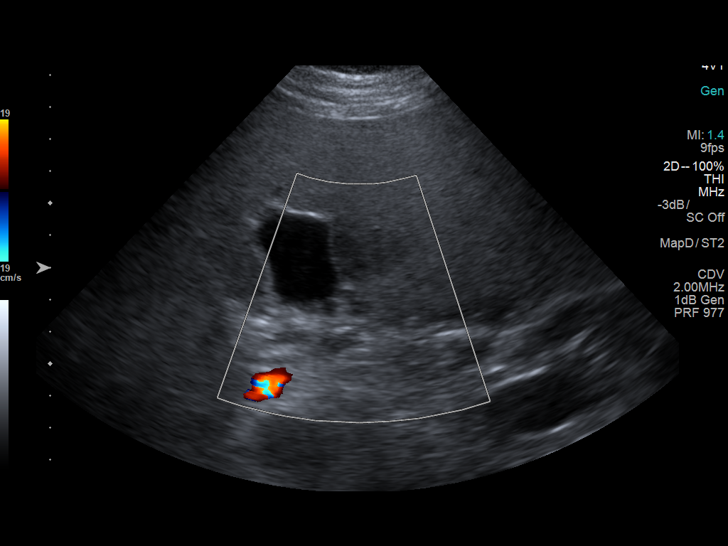
[im 85/85]
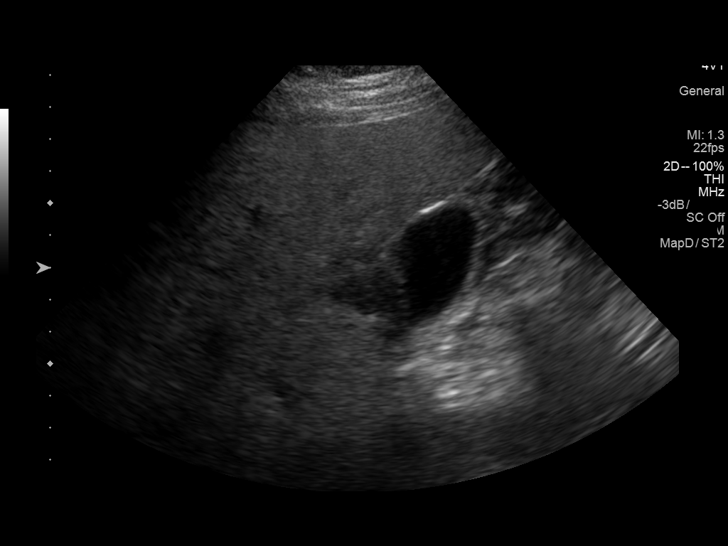

[14 of 25 positions shown; findings below may reference images not displayed]

FINDINGS: Gallbladder:

No gallstones or wall thickening visualized. No sonographic Murphy
sign noted by sonographer.

Common bile duct:

Diameter: 4.9 mm

Liver:

The liver is echogenic consistent with fatty infiltration and/or
hepatocellular disease. Focal hypoechoic area adjacent to the
gallbladder most likely focal fatty sparing. Similar finding noted
on prior CT .
IMPRESSION: 1. The liver is echogenic consistent with fatty infiltration and/or
hepatocellular disease. Focal fatty sparing adjacent to the
gallbladder fossa. Similar findings noted on prior CT of 04/08/2011
.

[DATE]. No gallstones or biliary distention.

## 2018-07-13 ENCOUNTER — Encounter: Payer: Self-pay | Admitting: Gastroenterology

## 2018-07-13 ENCOUNTER — Other Ambulatory Visit (INDEPENDENT_AMBULATORY_CARE_PROVIDER_SITE_OTHER): Payer: Medicare HMO

## 2018-07-13 ENCOUNTER — Ambulatory Visit: Payer: Medicare HMO | Admitting: Gastroenterology

## 2018-07-13 VITALS — BP 116/68 | HR 72 | Ht 69.0 in | Wt 237.5 lb

## 2018-07-13 DIAGNOSIS — Z23 Encounter for immunization: Secondary | ICD-10-CM

## 2018-07-13 DIAGNOSIS — R109 Unspecified abdominal pain: Secondary | ICD-10-CM

## 2018-07-13 DIAGNOSIS — I8500 Esophageal varices without bleeding: Secondary | ICD-10-CM

## 2018-07-13 DIAGNOSIS — E669 Obesity, unspecified: Secondary | ICD-10-CM | POA: Diagnosis not present

## 2018-07-13 DIAGNOSIS — K760 Fatty (change of) liver, not elsewhere classified: Secondary | ICD-10-CM | POA: Diagnosis not present

## 2018-07-13 DIAGNOSIS — K7460 Unspecified cirrhosis of liver: Secondary | ICD-10-CM | POA: Diagnosis not present

## 2018-07-13 LAB — PROTIME-INR
INR: 1.1 ratio — ABNORMAL HIGH (ref 0.8–1.0)
Prothrombin Time: 13.4 s — ABNORMAL HIGH (ref 9.6–13.1)

## 2018-07-13 LAB — HEPATIC FUNCTION PANEL
ALK PHOS: 98 U/L (ref 39–117)
ALT: 47 U/L (ref 0–53)
AST: 36 U/L (ref 0–37)
Albumin: 4.6 g/dL (ref 3.5–5.2)
Bilirubin, Direct: 0.2 mg/dL (ref 0.0–0.3)
TOTAL PROTEIN: 7.5 g/dL (ref 6.0–8.3)
Total Bilirubin: 0.7 mg/dL (ref 0.2–1.2)

## 2018-07-13 NOTE — Patient Instructions (Addendum)
If you are age 51 or older, your body mass index should be between 23-30. Your Body mass index is 35.07 kg/m. If this is out of the aforementioned range listed, please consider follow up with your Primary Care Provider.  If you are age 50 or younger, your body mass index should be between 19-25. Your Body mass index is 35.07 kg/m. If this is out of the aformentioned range listed, please consider follow up with your Primary Care Provider.   Please go to the lab in the basement of our building to have lab work done as you leave today. Hit "B" for basement when you get on the elevator.  When the doors open the lab is on your left.  We will call you with the results. Thank you.  We will refer to Nutrition Services.  They will reach out to you to schedule an appointment.  If you have not heard from them within 2 weeks please call us so we can follow up with them.  We are giving you a flu shot and a Hep B vaccine today.  You are scheduled for your 2nd Hep injection on Wednesday, 08-17-18 at 8:30am.  You will be due for a follow up visit in 6 months.  We will let you know when the schedule is open and it is time to schedule an appointment.    Thank you for entrusting me with your care and for choosing [facility], Dr. Millwood Cellar

## 2018-07-13 NOTE — Progress Notes (Signed)
HPI :  52 y/o male here for a follow up visit for cirrhosis of the liver, which is thought to be related to fatty liver disease. He has a history of small esophageal varices, and he is taking Coreg for this and HTN. He has not had any other decompensations. He has not been seen in over a year. Generally he thinks he has been doing okay. He has some R sided / flank abdominal pain, describes it as a tightness or pulling sensation, which is intermittent and often worse with lifting something or positional changes. He denies any postprandial relationship to his pain. He is eating okay. No nausea or vomiting. He is moving his bowels okay, no blood in the stools. He denies any ascites or LE edema.  He endorses drinking alcohol heavily much earlier in his life (30s), but has not been using any alcohol in recent years. He weighs 237 lbs today, he has had a hard time losing weight. He previously was recommended to have hepatitis B vaccine but he completed only one shot and did not follow up to complete the series. He denies any FH of liver disease.  Of note, the patient is deaf and speak with sign language translator.   CT scan 03/10/17 - diffuse hepatic steatosis, cirrhotic changes, no mass lesions, splenomegaly, mild ascites, stable reactive benign porta hepatis adenopathy  US abdomen 06/21/18 - fatty liver / cirrhosis, no mass lesion, no ascites  Endoscopic history: EGD 02/03/2017 - small esophageal varices, normal esophagus otherwise without stenosis, gastric polyps removed (hyperplastic) and gastritis (HP negative) Colonoscopy 02/03/2017 - diverticulosis, 2 small adenomas, hemorrhoids  Past Medical History:  Diagnosis Date  . Arthritis    SHOULDERS  . Balanitis   . Deafness, sensorineural, childhood onset    BILATERAL SINCE AGE 60  SECONDARY TO UNKNOWN ILLNESS  . GERD (gastroesophageal reflux disease)   . Hyperlipidemia   . Hypertension   . OSA on CPAP    SEVERE PER STUDY 03-09-2009  . Sleep  apnea    uses a cpap  . Smokers' cough ([place])   . Type 2 diabetes mellitus ([place])      Past Surgical History:  Procedure Laterality Date  . CIRCUMCISION N/A 12/19/2013   Procedure: CIRCUMCISION ADULT;  Surgeon: Lowella Bandy, MD;  Location: [facility];  Service: Urology;  Laterality: N/A;  . WRIST SURGERY Left    Family History  Problem Relation Age of Onset  . Cancer Mother        type unknown pt was 68 years old at that time  . Liver disease Father   . Healthy Brother   . Cancer Other         alot of family members have cancer but doesnt know the type   Social History   Tobacco Use  . Smoking status: Former Smoker    Packs/day: 1.00    Years: 30.00    Pack years: 30.00    Types: Cigarettes  . Smokeless tobacco: Never Used  . Tobacco comment: quit 4 years ago  Substance Use Topics  . Alcohol use: Yes    Comment: rarely  . Drug use: No   Current Outpatient Medications  Medication Sig Dispense Refill  . acetaminophen (TYLENOL) 325 MG tablet Take 650 mg by mouth every 6 (six) hours as needed.    Marland Kitchen aspirin EC 81 MG tablet Take 81 mg by mouth daily.    Marland Kitchen atorvastatin (LIPITOR) 40 MG tablet daily.    Marland Kitchen  carvedilol (COREG) 6.25 MG tablet Take 6.25 mg by mouth 2 (two) times daily with a meal.    . fenofibrate (TRICOR) 145 MG tablet Take 145 mg by mouth daily.    Marland Kitchen glipiZIDE (GLUCOTROL) 5 MG tablet 2 (two) times daily.    Marland Kitchen lisinopril (PRINIVIL,ZESTRIL) 20 MG tablet Take 20 mg by mouth every morning.    Marland Kitchen MAGNESIUM CARBONATE PO Take 250 mg by mouth.    . metFORMIN (GLUCOPHAGE) 500 MG tablet Take by mouth 2 (two) times daily with a meal.    . modafinil (PROVIGIL) 200 MG tablet Take 1/2 tablet to 1 tablet prior to breakfast 30 tablet 5  . Multiple Vitamins-Minerals (MULTIVITAMIN WITH MINERALS) tablet Take 1 tablet by mouth daily.    . Niacin, Antihyperlipidemic, (NIACIN ER, ANTIHYPERLIPIDEMIC, PO) Take 2,000 mg by mouth at bedtime.     Marland Kitchen omeprazole (PRILOSEC) 10 MG  capsule TK 1 C PO QD BEFORE MEAL  0  . tamsulosin (FLOMAX) 0.4 MG CAPS capsule Take 0.4 mg by mouth daily after supper.     No current facility-administered medications for this visit.    No Known Allergies   Review of Systems: All systems reviewed and negative except where noted in HPI.    US Abdomen Limited Ruq  Result Date: 06/21/2018 CLINICAL DATA:  Cirrhosis, hypertension, type II diabetes mellitus, former smoker EXAM: ULTRASOUND ABDOMEN LIMITED RIGHT UPPER QUADRANT COMPARISON:  CT abdomen and pelvis 03/10/2017 FINDINGS: Gallbladder: Slightly irregular. No definite wall thickening, stones, or sonographic Murphy sign. Common bile duct: Diameter: 3 mm , normal Liver: Increased hepatic echogenicity diffusely question fatty infiltration though this can be seen with cirrhosis and certain infiltrative disorders. Hepatic margins appear smooth. Hypoechoic region adjacent to gallbladder fossa consistent with focal sparing, seen on prior CT as well. No definite hepatic mass or nodularity otherwise identified. Portal vein is patent on color Doppler imaging with normal direction of blood flow towards the liver. No RIGHT upper quadrant free fluid. IMPRESSION: Increased hepatic echogenicity which can be seen with fatty infiltration and cirrhosis. Probable area of focal sparing adjacent to gallbladder fossa. No definite hepatic mass or nodularity. Electronically Signed   By: Lavonia Dana M.D.   On: 06/21/2018 09:30   Lab Results  Component Value Date   WBC 6.8 02/03/2017   HGB 14.8 02/03/2017   HCT 43.0 02/03/2017   MCV 89.1 02/03/2017   PLT 127.0 (L) 02/03/2017    Lab Results  Component Value Date   CREATININE 1.03 03/03/2017   BUN 19 03/03/2017   NA 142 12/19/2013   K 3.7 12/19/2013   CL 104 04/08/2011   CO2 29 01/10/2008    Lab Results  Component Value Date   ALT 47 07/13/2018   AST 36 07/13/2018   ALKPHOS 98 07/13/2018   BILITOT 0.7 07/13/2018   Lab Results  Component Value Date     INR 1.1 (H) 07/13/2018   INR 1.3 (H) 02/03/2017      Physical Exam: BP 116/68   Pulse 72   Ht 5\' 9"  (1.753 m)   Wt 237 lb 8 oz (107.7 kg)   BMI 35.07 kg/m  Constitutional: Pleasant,well-developed, male in no acute distress. HEENT: Normocephalic and atraumatic. Conjunctivae are normal. No scleral icterus. Neck supple.  Cardiovascular: Normal rate, regular rhythm.  Pulmonary/chest: Effort normal and breath sounds normal. No wheezing, rales or rhonchi. Abdominal: Soft, protuberant, nontender. There are no masses palpable. No hepatomegaly. Extremities: no edema Lymphadenopathy: No cervical adenopathy noted. Neurological: Alert and  oriented to person place and time. Skin: Skin is warm and dry. No rashes noted. Psychiatric: Normal mood and affect. Behavior is normal.   ASSESSMENT AND PLAN: 52 y/o male here for reassessment of the following issues:  Cirrhosis / fatty liver / esophageal varices / obesity - small esophageal varices, on Coreg, otherwise no other evidence of decompensation. I discussed long term risks for decompensation and [place] with him. He and his wife had several questions about his liver disease today. He's had a hard time losing weight, they had a lot of questions about how to best do this with dieting. I think they would benefit from a referral to see a nutritionist to help with weight loss. Otherwise, LFTs and INR done today which are stable. [place] screening UTD with [place], due for repeat US in 6 months. Will check AFP today. He is due for hepatitis B vaccine, will restart the series, and due for annual flu shot which was given today. He will continue Coreg, I will see him back in 6 months for reassessment. He agreed with the plan.   Abdominal pain - R sided pain, appears positional and related to lifting, I suspect musculoskeletal pain. [place] did not show any pathology. He can use tylenol PRN or try topical muscle rub. Follow up if symptoms persist  [place] Cellar,  MD [facility]

## 2018-07-14 LAB — AFP TUMOR MARKER: AFP-Tumor Marker: 3.2 ng/mL (ref <6.1)

## 2018-07-16 ENCOUNTER — Other Ambulatory Visit: Payer: Self-pay | Admitting: Nurse Practitioner

## 2018-07-19 DIAGNOSIS — H524 Presbyopia: Secondary | ICD-10-CM | POA: Diagnosis not present

## 2018-07-19 DIAGNOSIS — E119 Type 2 diabetes mellitus without complications: Secondary | ICD-10-CM | POA: Diagnosis not present

## 2018-07-19 LAB — HM DIABETES EYE EXAM

## 2018-07-29 ENCOUNTER — Encounter: Payer: Medicare HMO | Attending: Internal Medicine | Admitting: *Deleted

## 2018-07-29 DIAGNOSIS — Z713 Dietary counseling and surveillance: Secondary | ICD-10-CM | POA: Diagnosis not present

## 2018-07-29 DIAGNOSIS — K760 Fatty (change of) liver, not elsewhere classified: Secondary | ICD-10-CM | POA: Insufficient documentation

## 2018-07-29 DIAGNOSIS — K7460 Unspecified cirrhosis of liver: Secondary | ICD-10-CM | POA: Insufficient documentation

## 2018-07-29 DIAGNOSIS — E1140 Type 2 diabetes mellitus with diabetic neuropathy, unspecified: Secondary | ICD-10-CM

## 2018-07-29 DIAGNOSIS — E669 Obesity, unspecified: Secondary | ICD-10-CM | POA: Diagnosis not present

## 2018-07-29 DIAGNOSIS — E119 Type 2 diabetes mellitus without complications: Secondary | ICD-10-CM | POA: Insufficient documentation

## 2018-08-05 NOTE — Progress Notes (Signed)
Diabetes Self-Management Education  Visit Type:  Follow-up  Appt. Start Time: 0930 Appt. End Time: 1030  08/05/2018  [name], identified by name and date of birth, is a 52 y.o. male with a diagnosis of Diabetes: Patient is deaf, live interpreter is here for the visit. His wife is also deaf and is here also. She again participates in the visit in a postive way. He states his BG are better but he now has a fatty liver. Diet history was obtained and his eating habits include good variety of all food groups, and a low intake of any type of fat. He is physically active daily.     ASSESSMENT  There were no vitals taken for this visit. There is no height or weight on file to calculate BMI.   Diabetes Self-Management Education - 08/05/18 0951      Psychosocial Assessment   Patient Belief/Attitude about Diabetes  Motivated to manage diabetes    Self-care barriers  Hard of hearing   Deaf   Self-management support  Family;CDE visits    Patient Concerns  Nutrition/Meal planning;Medication;Weight Control    Preferred Learning Style  Visual    Learning Readiness  Change in progress      Complications   How often do you check your blood sugar?  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  --   5   How many minutes per day do you exercise?  --   90     Patient Self-Evaluation of Goals - Patient rates self as meeting previously set goals (% of time)   Nutrition  >75%    Physical Activity  >75%    Medications  >75%    Monitoring  >75%    Problem Solving  >75%    Reducing Risk  >75%    Health Coping  >75%      Outcomes   Program Status  Completed      Subsequent Visit   Since your last visit have you continued or begun to take your medications as prescribed?   Yes    Since your last visit, are you checking your blood glucose at least once a day?  Yes       Learning Objective:  Patient will have a greater understanding of diabetes self-management. Patient education plan is to attend individual and/or group sessions per assessed needs and concerns.   Plan:   Patient Instructions  Plan:   Aim for 3 Carb Choices per meal (45 grams) +/- 1 either way   Aim for 0-2 Carbs per snack if hungry   Include protein in moderation with your meals and snacks  On a Food Label, the number of grams of fat that equals 1 serving is 5 grams. Aim for about 10 grams or 2 servings per meal on average  Continue with your activity level daily as tolerated, great job!  Continue checking BG at alternate times per day   Continue taking medication as directed by MD  Expected Outcomes:  Demonstrated interest in learning. Expect positive outcomes  Education material provided: Food label handouts and Meal plan card, List of saturated vs unsaturated fats  If problems or questions, patient to contact  team via:  Phone  Future DSME appointment: -  PRN

## 2018-08-05 NOTE — Patient Instructions (Signed)
Plan:   Aim for 3 Carb Choices per meal (45 grams) +/- 1 either way   Aim for 0-2 Carbs per snack if hungry   Include protein in moderation with your meals and snacks  On a Food Label, the number of grams of fat that equals 1 serving is 5 grams. Aim for about 10 grams or 2 servings per meal on average  Continue with your activity level daily as tolerated, great job!  Continue checking BG at alternate times per day   Continue taking medication as directed by MD

## 2018-08-09 ENCOUNTER — Other Ambulatory Visit: Payer: Self-pay

## 2018-08-12 ENCOUNTER — Other Ambulatory Visit: Payer: Self-pay | Admitting: Nurse Practitioner

## 2018-08-17 ENCOUNTER — Ambulatory Visit (INDEPENDENT_AMBULATORY_CARE_PROVIDER_SITE_OTHER): Payer: Medicare HMO | Admitting: Gastroenterology

## 2018-08-17 DIAGNOSIS — Z23 Encounter for immunization: Secondary | ICD-10-CM

## 2018-08-20 ENCOUNTER — Other Ambulatory Visit: Payer: Self-pay | Admitting: Nurse Practitioner

## 2018-08-30 DIAGNOSIS — G4733 Obstructive sleep apnea (adult) (pediatric): Secondary | ICD-10-CM | POA: Diagnosis not present

## 2018-09-25 ENCOUNTER — Encounter: Payer: Self-pay | Admitting: Adult Health

## 2018-09-27 ENCOUNTER — Encounter: Payer: Self-pay | Admitting: Internal Medicine

## 2018-09-27 ENCOUNTER — Ambulatory Visit: Payer: Medicare PPO | Admitting: Internal Medicine

## 2018-09-27 ENCOUNTER — Encounter (INDEPENDENT_AMBULATORY_CARE_PROVIDER_SITE_OTHER): Payer: Self-pay

## 2018-09-27 VITALS — BP 118/62 | HR 63 | Temp 98.4°F | Ht 68.8 in | Wt 251.4 lb

## 2018-09-27 DIAGNOSIS — E1165 Type 2 diabetes mellitus with hyperglycemia: Secondary | ICD-10-CM | POA: Diagnosis not present

## 2018-09-27 DIAGNOSIS — I10 Essential (primary) hypertension: Secondary | ICD-10-CM

## 2018-09-27 LAB — POCT URINALYSIS DIPSTICK
Bilirubin, UA: NEGATIVE
Blood, UA: NEGATIVE
Glucose, UA: NEGATIVE
KETONES UA: NEGATIVE
Leukocytes, UA: NEGATIVE
Nitrite, UA: NEGATIVE
Protein, UA: NEGATIVE
Spec Grav, UA: 1.02 (ref 1.010–1.025)
Urobilinogen, UA: 0.2 E.U./dL
pH, UA: 5.5 (ref 5.0–8.0)

## 2018-09-27 LAB — POCT UA - MICROALBUMIN
Albumin/Creatinine Ratio, Urine, POC: 30
Creatinine, POC: 300 mg/dL
Microalbumin Ur, POC: 30 mg/L

## 2018-09-27 MED ORDER — METFORMIN HCL ER (OSM) 1000 MG PO TB24: 1000.0000 mg | ORAL_TABLET | Freq: Two times a day (BID) | ORAL | 0 refills | Status: DC

## 2018-09-27 NOTE — Progress Notes (Signed)
Subjective:     Patient ID: Stephen Chase , male    DOB: 01/29/1966 , 53 y.o.   MRN: [id number]   Chief Complaint  Patient presents with  . Diabetes    HPI  Glucose in the past month has been 195 -200. This am was 169. He brought his glucose meter, but there  Are no recordings of his glucose in it.  He walks for exercise and 30 minutes each time 3-4 times a week. He never called up or made apt for MA visit to be educated on Ozempic use. Also he has never been tried on higher dose of Metformin.  He is willing to try the Ozempic.  Past Medical History:  Diagnosis Date  . Arthritis    SHOULDERS  . Balanitis   . Deafness, sensorineural, childhood onset    BILATERAL SINCE AGE 76  SECONDARY TO UNKNOWN ILLNESS  . GERD (gastroesophageal reflux disease)   . Hyperlipidemia   . Hypertension   . OSA on CPAP    SEVERE PER STUDY 03-09-2009  . Sleep apnea    uses a cpap  . Smokers' cough ([place])   . Type 2 diabetes mellitus ([facility])      Family History  Problem Relation Age of Onset  . Cancer Mother        type unknown pt was 44 years old at that time  . Liver disease Father   . Healthy Brother   . Cancer Other         alot of family members have cancer but doesnt know the type     Current Outpatient Medications:  .  aspirin EC 81 MG tablet, Take 81 mg by mouth daily., Disp: , Rfl:  .  atorvastatin (LIPITOR) 40 MG tablet, daily., Disp: , Rfl:  .  carvedilol (COREG) 6.25 MG tablet, TAKE 1 TABLET BY MOUTH TWICE DAILY WITH FOOD, Disp: 180 tablet, Rfl: 1 .  fenofibrate (TRICOR) 145 MG tablet, TAKE 1 TABLET BY MOUTH DAILY, Disp: 90 tablet, Rfl: 1 .  glipiZIDE (GLUCOTROL) 5 MG tablet, 2 (two) times daily., Disp: , Rfl:  .  lisinopril (PRINIVIL,ZESTRIL) 20 MG tablet, TAKE 1 TABLET BY MOUTH ONCE DAILY, Disp: 90 tablet, Rfl: 1 .  metFORMIN (GLUCOPHAGE) 500 MG tablet, Take by mouth 2 (two) times daily with a meal., Disp: , Rfl:  .  modafinil (PROVIGIL) 200 MG tablet, Take 1/2 tablet to 1 tablet  prior to breakfast, Disp: 30 tablet, Rfl: 5 .  Multiple Vitamins-Minerals (MULTIVITAMIN WITH MINERALS) tablet, Take 1 tablet by mouth daily., Disp: , Rfl:  .  Niacin, Antihyperlipidemic, (NIACIN ER, ANTIHYPERLIPIDEMIC, PO), Take 2,000 mg by mouth at bedtime. , Disp: , Rfl:  .  omeprazole (PRILOSEC) 10 MG capsule, TAKE 1 CAPSULE BY MOUTH EVERY DAY BEFORE MEAL, Disp: 90 capsule, Rfl: 1   No Known Allergies   Review of Systems  Constitutional: Positive for diaphoresis. Negative for chills and fever.       A few times  HENT: Negative.   Respiratory: Negative for cough, chest tightness, shortness of breath and wheezing.   Cardiovascular: Negative for chest pain, palpitations and leg swelling.  Gastrointestinal: Negative for diarrhea, nausea and vomiting.  Endocrine: Negative for polydipsia and polyphagia.  Genitourinary: Negative for difficulty urinating.  Musculoskeletal: Negative for myalgias.  Skin: Negative for rash.  Neurological: Positive for headaches. Negative for numbness.       On occasion and BC powder helps.      Today's Vitals   09/27/18  0842  BP: 118/62  Pulse: 63  Temp: 98.4 F (36.9 C)  TempSrc: Oral  SpO2: 95%  Weight: 251 lb 6.4 oz (114 kg)  Height: 5' 8.8" (1.748 m)  PainSc: 4   PainLoc: Shoulder   Body mass index is 37.34 kg/m.   Objective:  Physical Exam   Constitutional: She is oriented to person, place, and time. She appears well-developed and well-nourished. No distress.  HENT:  Head: Normocephalic and atraumatic.  Right Ear: External ear normal.  Left Ear: External ear normal.  Nose: Nose normal.  Eyes: Conjunctivae are normal. Right eye exhibits no discharge. Left eye exhibits no discharge. No scleral icterus.  Neck: Neck supple. No thyromegaly present.  No carotid bruits bilaterally  Cardiovascular: Normal rate and regular rhythm.  No murmur heard. Pulmonary/Chest: Effort normal and breath sounds normal. No respiratory distress.   Musculoskeletal: Normal range of motion. She exhibits no edema.  Lymphadenopathy:    She has no cervical adenopathy.  Neurological: She is alert and oriented to person, place, and time.  Skin: Skin is warm and dry. Capillary refill takes less than 2 seconds. No rash noted. She is not diaphoretic.  Psychiatric: She has a normal mood and affect. Her behavior is normal. Judgment and thought content normal.  Nursing note reviewed. Assessment And Plan:     1. Uncontrolled type 2 diabetes mellitus with hyperglycemia ([place])- I will increase his Metformin to 1000 mg bid, AND IF  This is not sufficient, then next time increase the Glipizide as long as his liver and kidney function are normal.  - metformin (FORTAMET) 1000 MG (OSM) 24 hr tablet; Take 1 tablet (1,000 mg total) by mouth 2 (two) times daily with a meal.  Dispense: 60 tablet; Refill: 0 - Hemoglobin A1c  2. Essential hypertension- stable. May continue same dose - CMP14 + Anion Gap - CBC no Diff FU 1 month, Fu on increase of Metformin to 1000 mg bid and review his glucose readings.   Oaklyn Mans RODRIGUEZ-SOUTHWORTH, PA-C

## 2018-09-27 NOTE — Patient Instructions (Addendum)
It was good to see you this am. I did not know if Stephen Chase had tried to increase your pill dose in the past or not, so before we go to the shot, we can have you try increasing the dose of the pills you are already on. Lets start with the Metformin first.   I am increasing your Metformin to 1000 mg twice a day, and if this does not help, as long as your liver and kidney function are normal, we can increase the Glipizide next.  Please write down the numbers of your glucose in paper and bring them with you Fasting and 2 hours after eating.   See you in one month.

## 2018-09-28 ENCOUNTER — Encounter: Payer: Self-pay | Admitting: Neurology

## 2018-09-28 ENCOUNTER — Ambulatory Visit: Payer: Medicare PPO | Admitting: Neurology

## 2018-09-28 ENCOUNTER — Other Ambulatory Visit: Payer: Self-pay | Admitting: Internal Medicine

## 2018-09-28 VITALS — BP 116/63 | HR 68 | Ht 68.5 in | Wt 256.0 lb

## 2018-09-28 DIAGNOSIS — Z9989 Dependence on other enabling machines and devices: Secondary | ICD-10-CM | POA: Diagnosis not present

## 2018-09-28 DIAGNOSIS — H9193 Unspecified hearing loss, bilateral: Secondary | ICD-10-CM

## 2018-09-28 DIAGNOSIS — G4733 Obstructive sleep apnea (adult) (pediatric): Secondary | ICD-10-CM | POA: Diagnosis not present

## 2018-09-28 DIAGNOSIS — D696 Thrombocytopenia, unspecified: Secondary | ICD-10-CM

## 2018-09-28 DIAGNOSIS — G4710 Hypersomnia, unspecified: Secondary | ICD-10-CM

## 2018-09-28 DIAGNOSIS — E1140 Type 2 diabetes mellitus with diabetic neuropathy, unspecified: Secondary | ICD-10-CM | POA: Diagnosis not present

## 2018-09-28 LAB — CMP14 + ANION GAP
ALT: 40 IU/L (ref 0–44)
AST: 35 IU/L (ref 0–40)
Albumin/Globulin Ratio: 2 (ref 1.2–2.2)
Albumin: 4.7 g/dL (ref 3.5–5.5)
Alkaline Phosphatase: 82 IU/L (ref 39–117)
Anion Gap: 17 mmol/L (ref 10.0–18.0)
BUN/Creatinine Ratio: 12 (ref 9–20)
BUN: 13 mg/dL (ref 6–24)
Bilirubin Total: 0.6 mg/dL (ref 0.0–1.2)
CO2: 25 mmol/L (ref 20–29)
Calcium: 9.9 mg/dL (ref 8.7–10.2)
Chloride: 100 mmol/L (ref 96–106)
Creatinine, Ser: 1.13 mg/dL (ref 0.76–1.27)
GFR calc Af Amer: 87 mL/min/{1.73_m2} (ref >59)
GFR calc non Af Amer: 75 mL/min/{1.73_m2} (ref >59)
Globulin, Total: 2.3 g/dL (ref 1.5–4.5)
Glucose: 188 mg/dL — ABNORMAL HIGH (ref 65–99)
Potassium: 4.2 mmol/L (ref 3.5–5.2)
Sodium: 142 mmol/L (ref 134–144)
Total Protein: 7 g/dL (ref 6.0–8.5)

## 2018-09-28 LAB — CBC
Hematocrit: 42.1 % (ref 37.5–51.0)
Hemoglobin: 14.4 g/dL (ref 13.0–17.7)
MCH: 29.6 pg (ref 26.6–33.0)
MCHC: 34.2 g/dL (ref 31.5–35.7)
MCV: 86 fL (ref 79–97)
Platelets: 148 10*3/uL — ABNORMAL LOW (ref 150–450)
RBC: 4.87 x10E6/uL (ref 4.14–5.80)
RDW: 12.9 % (ref 11.6–15.4)
WBC: 6.7 10*3/uL (ref 3.4–10.8)

## 2018-09-28 LAB — HEMOGLOBIN A1C
Est. average glucose Bld gHb Est-mCnc: 189 mg/dL
Hgb A1c MFr Bld: 8.2 % — ABNORMAL HIGH (ref 4.8–5.6)

## 2018-09-28 MED ORDER — MODAFINIL 200 MG PO TABS: 200.0000 mg | ORAL_TABLET | Freq: Every day | ORAL | 5 refills | Status: DC

## 2018-09-28 NOTE — Progress Notes (Signed)
SLEEP MEDICINE CLINIC   Provider:  Larey Seat, MD   Primary Care Physician:  Jossie Ng Sandrea Matte, FNP at [facility]    Referring Provider: [name], FNP    Chief Complaint  Patient presents with  . Follow-up    pt with interpreter, pt states that machine is working well for him. pt states that he feels sometimes he doesnt get enough sleep. he states that if he hits less then 5 hrs he doesnt function well. complains of brain fog and still has daytime sleepiness. DME [employer] pt states that if he is up and doing things he can get passed the tiredness but if he is sitting or reading he gets tired easily. pt states [employer] sends him too many supplies would like to send order to them asking send supplies every 6 mths  . Other    instead of every 3 mths. pt states he has tried to tell them but they keep sending.     HPI:  Stephen Chase is a 53 y.o. male , seen here as in a referral  from [place] for a re- evaluation of sleep apnea,in the presence of his sign Ecologist.  09-28-2018, follow up on CPAP titration and compliance.  [name] was evaluated by a home sleep study dated 08 June 2018 by watch pat device.  The total AHI was 16.6/h there was mild to moderate snoring noticed, oxygen nadir was 89%, average heart rate was 56 bpm.  The patient had already been on CPAP before and we continued CPAP treatment with an auto titration device.  I have the opportunity to see the excellent compliance the patient has used the machine 30 out of 30 days and 26 of those days over 4 hours.  Average use of time is 5 hours 58 minutes each night the AutoSet has a pressure window between 6 cmH2O and 19 cm water pressure with 3 cm expiratory pressure relief.  The 95th percentile pressure is 16 cmH2O with an AHI of 6.2 this seems to be related to unknown events at 2.5/h and I am not quite sure where they come from.   He does not have central apneas and he has very  minimal air leakage.  I noticed that only 3 out of 30 nights had a very high AHI and this may have been a artifactual count the general average AHI is truly under 2/h.  He wants to keep everything the same. He wonders why he is still having attention problem and a high degree of fatigue and sleepiness, EDS Epworth at 12 , FSS at 43/ 63 points.  He has DM and normal thyroid -, DM may be contributing. HGB A1c 8.1 per patient's report.  He could use modafinil for fatigue and EDS. I had send a Narcolepsy test HLA on 05-09-2018, negative results. .    I have the pleasure of meeting [name] today, a 53 year old male patient of the [facility] with a history of deafness since age 21 after infection, chronically elevated liver enzymes in the setting of fatty liver disease, HTN,  and diabetes mellitus with a recent hemoglobin A1c 8.9, elevated triglycerides, normal TSH, T4 and uptake. He is on multiple medications for HTN and oral medication for DM.  He remains morbidly obese, and is using CPAP in the treatment of OSA. He was diagnosed at [facility] and given a CPAP but his sleep study is not on Epic-  May  have been more  than 10 years ago.  He is using the second machine ever issued to him now, replaced by [employer]. This is a very well cared for machine. He uses it compliantly - he reports having excessive daytime sleepiness.    Chief complaint according to patient : EDS- Epworth sleepiness score of 15/ 24 points. he reports easily falling asleep easily when not stimulated or physically active. EDS- Ever since he was a child. He slept in school. His brother would wake him because he snored loudly before he turned 67.  His CPAP has improved his alertness but he sleeps only 4-5 hours each night and feels sleep deprived. His use of CPAP has not given him that much of a boost in terms of EDS.    Sleep habits are as follows: Dinner time for this patient is 6-7 PM, and he later  has a night time snack. he spends the hours after dinner watching TV - he is very sedate, and easily falling asleep. He goes to bed around 11- midnight , if he goes earlier he wont be sleeping, in spite of sleeping on his couch. His bedroom is always quiet for him, but it is dark and cool. No TV watching in the bedroom.  He sometimes struggles with going to sleep, other nights he is promptly asleep. He will sleep until his first nocturia- 4-5 hours later and often stays awake for the remainder of the night. Nocturia came on with DM, he reports no vivid dreams, no nightmares.   He is not watching Tv in AM- rises at 6.30 AM and depending on his work demands (self employed). He feels initially refreshed but gets tired again soon . Has a substantial breakfast each morning.  No headaches, no chest pain. Naps are 15 minutes - max 30 minimal.    Sleep medical history and family sleep history: OSA in brother.   Social history: married, self employed, grown children, 2 daughters aged 73 and 42 . Quit 5 years ago, was a cigarette smoker of 1 ppd. ETOH-used to drink  beer, cut down to help liver function, now 1-2 a week max. 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   Review of Systems: Out of a complete 14 system review, the patient complains of only the following symptoms, and all other reviewed systems are negative. Snoring before CPAP, apnea before CPAP, wife is deaf as well- no witnesses.   Pre- Epworth score 15/ 24 - now 12  , Fatigue severity score 48  , depression score n/a   Social History   Socioeconomic History  . Marital status: Married    Spouse name: Not on file  . Number of children: 2  . Years of education: Not on file  . Highest education level: Not on file  Occupational History  . Occupation: unemployed  Social Needs  . Financial  resource strain: Not on file  . Food insecurity:    Worry: Not on file    Inability: Not on file  . Transportation needs:    Medical: Not on file    Non-medical: Not on file  Tobacco Use  . Smoking status: Former Smoker    Packs/day: 1.00    Years: 30.00    Pack years: 30.00    Types: Cigarettes  .  Smokeless tobacco: Never Used  . Tobacco comment: quit 4 years ago  Substance and Sexual Activity  . Alcohol use: Yes    Comment: rarely  . Drug use: No  . Sexual activity: Not on file  Lifestyle  . Physical activity:    Days per week: Not on file    Minutes per session: Not on file  . Stress: Not on file  Relationships  . Social connections:    Talks on phone: Not on file    Gets together: Not on file    Attends religious service: Not on file    Active member of club or organization: Not on file    Attends meetings of clubs or organizations: Not on file    Relationship status: Not on file  . Intimate partner violence:    Fear of current or ex partner: Not on file    Emotionally abused: Not on file    Physically abused: Not on file    Forced sexual activity: Not on file  Other Topics Concern  . Not on file  Social History Narrative  . Not on file    Family History  Problem Relation Age of Onset  . Cancer Mother        type unknown pt was 32 years old at that time  . Liver disease Father   . Healthy Brother   . Cancer Other         alot of family members have cancer but doesnt know the type    Past Medical History:  Diagnosis Date  . Arthritis    SHOULDERS  . Balanitis   . Deafness, sensorineural, childhood onset    BILATERAL SINCE AGE 45  SECONDARY TO UNKNOWN ILLNESS  . GERD (gastroesophageal reflux disease)   . Hyperlipidemia   . Hypertension   . OSA on CPAP    SEVERE PER STUDY 03-09-2009  . Sleep apnea    uses a cpap  . Smokers' cough ([place])   . Type 2 diabetes mellitus ([place])     Past Surgical History:  Procedure Laterality Date  . CIRCUMCISION N/A  12/19/2013   Procedure: CIRCUMCISION ADULT;  Surgeon: Lowella Bandy, MD;  Location: [facility];  Service: Urology;  Laterality: N/A;  . WRIST SURGERY Left     Current Outpatient Medications  Medication Sig Dispense Refill  . aspirin EC 81 MG tablet Take 81 mg by mouth daily.    Marland Kitchen atorvastatin (LIPITOR) 40 MG tablet daily.    . carvedilol (COREG) 6.25 MG tablet TAKE 1 TABLET BY MOUTH TWICE DAILY WITH FOOD 180 tablet 1  . fenofibrate (TRICOR) 145 MG tablet TAKE 1 TABLET BY MOUTH DAILY 90 tablet 1  . glipiZIDE (GLUCOTROL) 5 MG tablet 2 (two) times daily.    Marland Kitchen lisinopril (PRINIVIL,ZESTRIL) 20 MG tablet TAKE 1 TABLET BY MOUTH ONCE DAILY 90 tablet 1  . metformin (FORTAMET) 1000 MG (OSM) 24 hr tablet Take 1 tablet (1,000 mg total) by mouth 2 (two) times daily with a meal. 60 tablet 0  . modafinil (PROVIGIL) 200 MG tablet Take 1/2 tablet to 1 tablet prior to breakfast 30 tablet 5  . Multiple Vitamins-Minerals (MULTIVITAMIN WITH MINERALS) tablet Take 1 tablet by mouth daily.    . Niacin, Antihyperlipidemic, (NIACIN ER, ANTIHYPERLIPIDEMIC, PO) Take 2,000 mg by mouth at bedtime.     Marland Kitchen omeprazole (PRILOSEC) 10 MG capsule TAKE 1 CAPSULE BY MOUTH EVERY DAY BEFORE MEAL 90 capsule 1   No current facility-administered medications for  this visit.     Allergies as of 09/28/2018  . (No Known Allergies)   I reviewed labs form [facility]- has elevated AST and ALT, low HDL. He has a history of chronic elevation in ALT and fatty liver. Since his last visit he underwent an upper endoscopy for his symptoms of dysphagia. EGD showed no pathology for his dysphagia but was remarkable for small esophageal varices, raising suspicion for underlying cirrhosis. He underwent extensive serologic testing which was negative for chronic liver diseases. He was not immune to hepatitis B on this workup. CBC showed thrombocytopenia and INR of 1.3. Of note on remote CT scan in 2012 he had a 3cm portocaval lymph  node of unclear significance. He does endorse a history of binge drinking in his 30-40s but was mostly only on the weekends, he denies any daily use of alcohol. He does have DM and is overweight, with history of fatty liver noted on CT scan in 2012. He is taking Coreg already for HTN. He endorses being prescribed mobic to use daily for pain, he denies other NSAID use.  CPAP download on 05-09-18, Mr. Thames used his CPAP 87% of the last 30 days with an average use at time of 5 hours and 31 minutes, he is using an AutoSet capable CPAP which is set at a very high pressure of 19 cmH2O with 3 cm EPR.  He does not have major air leaks using a full facemask by [employer].  His AHI is 1.3, this means the index of apneas per hour of sleep is very low and speaks for good control of his underlying apnea.  Unfortunately I do not know his baseline apnea when he is untreated.      Vitals: BP 116/63   Pulse 68   Ht 5' 8.5" (1.74 m)   Wt 256 lb (116.1 kg)   BMI 38.36 kg/m  Last Weight:  Wt Readings from Last 1 Encounters:  09/28/18 256 lb (116.1 kg)   SEG:BTDV mass index is 38.36 kg/m.     Last Height:   Ht Readings from Last 1 Encounters:  09/28/18 5' 8.5" (1.74 m)    Physical exam:  General: The patient is awake, alert and appears not in acute distress. The patient is well groomed. He reports breathing difficulties with phlegm. He stopped using CPAP and was much sleepier after that night.  Head: Normocephalic, atraumatic. Neck is supple. Mallampati 5,  neck circumference:21 " . Nasal airflow is congested -  ,  Retrognathia is not seen.  Cardiovascular:  Regular rate and rhythm , without  murmurs or carotid bruit, and without distended neck veins. Respiratory: Lungs are clear to auscultation. No wheezing. Skin: Without evidence of edema, or rash Trunk: BMI is 38.5 . The patient's posture is erect.   Neurologic exam : The patient is awake and alert, oriented to place and time.   Memory subjective  described as intact.   Attention span & concentration ability appears normal.  Speech is absent , Mood and affect are appropriate.  Cranial nerves: Pupils are equal and briskly reactive to light. Funduscopic exam without   evidence of pallor or edema. Extraocular movements  in vertical and horizontal planes intact and without nystagmus. Visual fields by finger perimetry are intact. Hearing - the patient is deaf.   Facial sensation intact to fine touch.  Facial motor strength is symmetric and tongue and uvula is not visible . Shoulder shrug was symmetrical.   Motor exam:   Normal  tone, muscle bulk and symmetric strength in all extremities. Gait and station: Patient walks without assistive device - Turns with 3 Steps. .  Deep tendon reflexes: in the  upper and lower extremities are attenuated - but symmetric. Babinski maneuver response is downgoing.  Assessment:  After physical and neurologic examination, review of laboratory studies,  Personal review of imaging studies, reports of other /same  Imaging studies, results of polysomnography and / or neurophysiology testing and pre-existing records as far as provided in visit., my assessment is   1)  OSA well treated on CPAP< the outlier was 3 nights with high AHI, true average is below 5 and likely below 3.   2) residual hypersomnia is still present but his excessive daytime sleepiness by Epworth score has been reduced from 15-12 points. I will order an overnight pulse oximetry that has to be used at home while the patient is using his CPAP to see if he has still hypoxemia could he have low oxygen levels but we did not detect in his home sleep test.  3) the third step is symptomatic treatment by prescribing modafinil. He tried only 100 mg and found little effect, will ask to go to 200 MG.  Modafinil can elevate blood pressures and heart rate but usually that side effect can be prevented by avoiding caffeine intake.  Modafinil can help from 6 to 10  hours to stay more alert more concentrated.  It is FDA approved to be used in patients with sleep apnea who have residual hypersomnia.   The patient was advised of the nature of the diagnosed disorder , the treatment options and the  risks for general health and wellness arising from not treating the condition.   I spent more than 55 minutes of face to face time with the patient, due to translation services and complications in obtaining other records related  to sleep apnea testing in the past.  Greater than 50% of time was spent in counseling and coordination of care. We have discussed the diagnosis and differential and I answered the patient's questions.    Plan:  Treatment plan and additional workup :  HLA testing for narcolepsy  Armodafinil  200 mg ( 100 mg to 200 mg) for OSA patient compliant with CPAP at 19 cm water , and remaining hypersomnic.    Larey Seat, MD 04/25/1659, 63:01 AM  Certified in Neurology by ABPN Certified in [place] by [facility] [address] [place], [place] [address]

## 2018-10-08 ENCOUNTER — Other Ambulatory Visit: Payer: Self-pay | Admitting: Nurse Practitioner

## 2018-10-10 ENCOUNTER — Other Ambulatory Visit: Payer: Self-pay | Admitting: Nurse Practitioner

## 2018-10-10 DIAGNOSIS — E1165 Type 2 diabetes mellitus with hyperglycemia: Secondary | ICD-10-CM

## 2018-10-10 MED ORDER — METFORMIN HCL 500 MG PO TABS: ORAL_TABLET | ORAL | 2 refills | Status: DC

## 2018-10-17 ENCOUNTER — Telehealth: Payer: Self-pay

## 2018-10-17 NOTE — Telephone Encounter (Signed)
Patient's wife called wanting to know the status of his PA for metformin.  I returned her call and advised him that we have completed the PA and are just waiting on the response from ins company. Pt advised to take metformin 500mg  2 tabs BID. YRL,RMA

## 2018-10-19 ENCOUNTER — Inpatient Hospital Stay: Payer: Medicare HMO | Admitting: Hematology

## 2018-10-20 ENCOUNTER — Other Ambulatory Visit: Payer: Self-pay

## 2018-10-20 DIAGNOSIS — E1165 Type 2 diabetes mellitus with hyperglycemia: Secondary | ICD-10-CM

## 2018-10-20 MED ORDER — METFORMIN HCL 500 MG PO TABS: ORAL_TABLET | ORAL | 2 refills | Status: DC

## 2018-10-25 ENCOUNTER — Encounter: Payer: Self-pay | Admitting: Internal Medicine

## 2018-10-25 ENCOUNTER — Ambulatory Visit: Payer: Medicare PPO | Admitting: Internal Medicine

## 2018-10-25 VITALS — BP 122/70 | HR 62 | Temp 97.7°F | Ht 65.6 in | Wt 255.0 lb

## 2018-10-25 DIAGNOSIS — I10 Essential (primary) hypertension: Secondary | ICD-10-CM

## 2018-10-25 DIAGNOSIS — E1165 Type 2 diabetes mellitus with hyperglycemia: Secondary | ICD-10-CM

## 2018-10-25 DIAGNOSIS — M25572 Pain in left ankle and joints of left foot: Secondary | ICD-10-CM | POA: Diagnosis not present

## 2018-10-25 MED ORDER — METFORMIN HCL ER 500 MG PO TB24: ORAL_TABLET | ORAL | 0 refills | Status: DC

## 2018-10-25 MED ORDER — GLIPIZIDE 10 MG PO TABS: ORAL_TABLET | ORAL | 11 refills | Status: DC

## 2018-10-25 NOTE — Patient Instructions (Addendum)
THANK YOU SO MUCH FOR BRINGING YOUR GLUCOSE DIARY, THIS HELPS ME HELP YOUR DIABETES GET BETTER AND PREVENT YOU FROM ENDED UP WITH KIDNEY DAMAGE AND DIALYSIS DOWN THE ROAD.   TRY TO EXERCISE AT LEAST 30 MINUTES A DAY FOR 4 DAYS A WEEK. THIS WILL HELP YOUR INSULIN WORK BETTER.    CONTINUE RECORDING YOUR FASTING GLUCOSE AND AFTER EATING GLUCOSE FOR THE NEXT 2 WEEKS AND ALSO RECORD EVERY THINGS YOU EAT.   BRING YOUR GLUCOSE MACHINE TO COMPARE IT WITH OURS AND MAKE SURE YOUR NUMBERS MATCH OUT  I WILL CHANGE YOUR METFORMIN TO THE EXTENDED RELEASE WHICH DOES NOT MAKE YOU GO TO HAVE BOWEL MOVEMENTS AS OFTEN. I WILL SEND  METFORMIN 500 MG XL 2 TWICE A DAY  ALSO TAKE YOUR GLUCOTROL OR GLIPIZIDE TO 5 MG IN THE AM( 1/2 TABLET) AND 10 MG AT DINNER.  I MAY NEED TO CONTINUE INCREASING YOUR MEDICATION NEXT TIME AS WELL, BUT EACH TIME I WILL CHECK YOUR LIVER AND KIDNEY FUNCTION TO MAKE SURE THEY ARE STAYING NORMAL.   I WILL SEE YOU IN 2 WEEKS.   YOUR LEFT ANKLE PAIN SEEMS TO BE COMING FROM THE LIGAMENT AND IS INFLAMMED, BUT WITH 2 MONTHS OF PAIN, LETS LOOK A THE BONE. IF THE XRAY IS NEGATIVE YOU CAN GO TO PHYSICAL THERAPY.

## 2018-10-25 NOTE — Progress Notes (Signed)
Subjective:     Patient ID: Stephen Chase , male    DOB: 21-Jul-1966 , 53 y.o.   MRN: [id number]   Chief Complaint  Patient presents with  . Diabetes  . Ankle Pain    patient states his ankle is a little swollen    HPI  FU DM and increase of Metformin to 1000 mg bid.  He did not bring his glucometer today, but brought written diary for me: Date         Fasting           Postprandial 1/8             187                   171 1/9              200                  164 1/10            186                  221  Little sweaty 1/11            177                  166 1/12            161                  147 1/13            187         1/14            199                  150 1/15            186                  172 1/16            205                   219 1/17            212 1/18                                     166 1/19            206                   203 1/20            198                   151 1/21            202                   194 1/23            206                    1/24            138                    1/25            216  1/26            172                    123 1/28            189 1/29            198                     158 1/31            176                     225 2/1              177                     177 2/2              126                     157 2/3              252                     215 2/4              203                                       2- Pt states he has been having L ankle pain x 2 month on medial ankle and feel deep. Has not injured himself that he recalls. Denies any swelling. Pain is provoked with pressing behind the malleolus and with certain ankle movements. Better if resting and keeping foot straight.  Past Medical History:  Diagnosis Date  . Arthritis    SHOULDERS  . Balanitis   . Deafness, sensorineural, childhood onset    BILATERAL SINCE AGE 38  SECONDARY TO UNKNOWN ILLNESS  . GERD (gastroesophageal reflux  disease)   . Hyperlipidemia   . Hypertension   . OSA on CPAP    SEVERE PER STUDY 03-09-2009  . Sleep apnea    uses a cpap  . Smokers' cough ([place])   . Type 2 diabetes mellitus ([facility])      Family History  Problem Relation Age of Onset  . Cancer Mother        type unknown pt was 43 years old at that time  . Liver disease Father   . Healthy Brother   . Cancer Other         alot of family members have cancer but doesnt know the type     Current Outpatient Medications:  .  aspirin EC 81 MG tablet, Take 81 mg by mouth daily., Disp: , Rfl:  .  atorvastatin (LIPITOR) 40 MG tablet, daily., Disp: , Rfl:  .  carvedilol (COREG) 6.25 MG tablet, TAKE 1 TABLET BY MOUTH TWICE DAILY WITH FOOD, Disp: 180 tablet, Rfl: 1 .  fenofibrate (TRICOR) 145 MG tablet, TAKE 1 TABLET BY MOUTH DAILY, Disp: 90 tablet, Rfl: 1 .  glipiZIDE (GLUCOTROL) 5 MG tablet, 2 (two) times daily., Disp: , Rfl:  .  lisinopril (PRINIVIL,ZESTRIL) 20 MG tablet, TAKE 1 TABLET BY MOUTH ONCE DAILY, Disp: 90 tablet, Rfl: 1 .  metFORMIN (GLUCOPHAGE) 500 MG tablet, Take 2 tablets by  mouth twice a day, Disp: 120 tablet, Rfl: 2 .  modafinil (PROVIGIL) 200 MG tablet, Take 1 tablet (200 mg total) by mouth daily., Disp: 30 tablet, Rfl: 5 .  Multiple Vitamins-Minerals (MULTIVITAMIN WITH MINERALS) tablet, Take 1 tablet by mouth daily., Disp: , Rfl:  .  Niacin, Antihyperlipidemic, (NIACIN ER, ANTIHYPERLIPIDEMIC, PO), Take 2,000 mg by mouth at bedtime. , Disp: , Rfl:  .  omeprazole (PRILOSEC) 10 MG capsule, TAKE 1 CAPSULE BY MOUTH EVERY DAY BEFORE MEAL, Disp: 90 capsule, Rfl: 1   No Known Allergies   Review of Systems  Constitutional: Positive for diaphoresis.       Once  HENT: Negative for tinnitus.   Eyes: Negative for visual disturbance.  Respiratory: Negative for cough and choking.   Cardiovascular: Negative for chest pain, palpitations and leg swelling.  Gastrointestinal: Negative for abdominal distention, abdominal pain,  constipation, diarrhea, nausea and vomiting.  Endocrine: Negative for polydipsia and polyphagia.  Genitourinary: Negative for difficulty urinating.  Musculoskeletal: Positive for arthralgias. Negative for gait problem, joint swelling and myalgias.       L medial ankle pain   Skin: Negative for rash and wound.  Neurological: Negative for dizziness, weakness and numbness.  Hematological: Negative for adenopathy.  Psychiatric/Behavioral: Sleep disturbance:      Today's Vitals   10/25/18 0841  BP: 122/70  Pulse: 62  Temp: 97.7 F (36.5 C)  TempSrc: Oral  SpO2: 94%  Weight: 255 lb (115.7 kg)  Height: 5' 5.6" (1.666 m)  PainSc: 2   PainLoc: Ankle   Body mass index is 41.66 kg/m.   Objective:  Physical Exam  Has an interpretor with him Constitutional: He is oriented to person, place, and time. He appears well-developed and well-nourished. No distress.  HENT:  Head: Normocephalic and atraumatic.  Right Ear: External ear normal.  Left Ear: External ear normal.  Nose: Nose normal.  Eyes: Conjunctivae are normal. Right eye exhibits no discharge. Left eye exhibits no discharge. No scleral icterus.  Neck: Neck supple. No thyromegaly present.  No carotid bruits bilaterally  Cardiovascular: Normal rate and regular rhythm.  No murmur heard. Pulmonary/Chest: Effort normal and breath sounds normal. No respiratory distress.  Musculoskeletal: Normal range of motion. He exhibits no edema.                    L ANKLE- no swelling and normal ROM. Has tenderness behind the medial                   Malleolus, and worse when he dorsiflexes his foot. Pedal pulses are intact.  Lymphadenopathy: He has no cervical adenopathy.  Neurological: He is alert and oriented to person, place, and time.  Skin: Skin is warm and dry. Capillary refill takes less than 2 seconds. No rash noted. He is not diaphoretic.  Psychiatric: He has a normal mood and affect. Her behavior is normal. Judgment and thought content  normal.  Nursing note reviewed.   Assessment And Plan:  1. Uncontrolled type 2 diabetes mellitus with hyperglycemia ([place])- still not at optimal control. I changed his Glucophage to the extended release to see if this would help his frequent stools. I also increased his pm glipizide.  - metFORMIN (GLUCOPHAGE XR) 500 MG 24 hr tablet; 2 BID WITH BREAKFAST AND DINNER  Dispense: 120 tablet; Refill: 0 - glipiZIDE (GLUCOTROL) 10 MG tablet; 1/2 in am before breakfast, and one with dinner  Dispense: 60 tablet; Refill: 11 I asked him to continue with  glucose diaries and now I need his food diary at least 4 days a week to bring in 2 weeks for FU.  If glucose are still elevated next time, and CMP normal, I may increase the glipizide to 10 mg bid.  2. Essential hypertension- stable. May stay on current med dose.  - CMP14 + Anion Gap - DG Ankle Complete Left; Future  3. Acute left ankle pain- sent to get L ankle xray. I advised him PT if xray is negative, he will think abut it. I will review his labs and xray when he comes next visit.         Vihan Santagata RODRIGUEZ-SOUTHWORTH, PA-C

## 2018-10-26 LAB — CMP14 + ANION GAP
ALBUMIN: 4.7 g/dL (ref 3.8–4.9)
ALK PHOS: 91 IU/L (ref 39–117)
ALT: 40 IU/L (ref 0–44)
AST: 32 IU/L (ref 0–40)
Albumin/Globulin Ratio: 1.8 (ref 1.2–2.2)
Anion Gap: 17 mmol/L (ref 10.0–18.0)
BUN/Creatinine Ratio: 14 (ref 9–20)
BUN: 13 mg/dL (ref 6–24)
Bilirubin Total: 0.5 mg/dL (ref 0.0–1.2)
CO2: 25 mmol/L (ref 20–29)
CREATININE: 0.93 mg/dL (ref 0.76–1.27)
Calcium: 9.8 mg/dL (ref 8.7–10.2)
Chloride: 98 mmol/L (ref 96–106)
GFR calc Af Amer: 109 mL/min/{1.73_m2} (ref >59)
GFR calc non Af Amer: 95 mL/min/{1.73_m2} (ref >59)
Globulin, Total: 2.6 g/dL (ref 1.5–4.5)
Glucose: 217 mg/dL — ABNORMAL HIGH (ref 65–99)
Potassium: 4.4 mmol/L (ref 3.5–5.2)
Sodium: 140 mmol/L (ref 134–144)
Total Protein: 7.3 g/dL (ref 6.0–8.5)

## 2018-11-15 ENCOUNTER — Other Ambulatory Visit: Payer: Self-pay

## 2018-11-15 ENCOUNTER — Ambulatory Visit: Payer: Medicare PPO | Admitting: Internal Medicine

## 2018-11-15 ENCOUNTER — Encounter: Payer: Self-pay | Admitting: Internal Medicine

## 2018-11-15 VITALS — BP 118/68 | HR 71 | Temp 97.8°F | Ht 63.0 in | Wt 255.2 lb

## 2018-11-15 DIAGNOSIS — E1165 Type 2 diabetes mellitus with hyperglycemia: Secondary | ICD-10-CM

## 2018-11-15 DIAGNOSIS — I10 Essential (primary) hypertension: Secondary | ICD-10-CM

## 2018-11-15 MED ORDER — GLIPIZIDE 10 MG PO TABS: 10.0000 mg | ORAL_TABLET | Freq: Two times a day (BID) | ORAL | 0 refills | Status: DC

## 2018-11-15 NOTE — Progress Notes (Signed)
Subjective:     Patient ID: Stephen Chase , male    DOB: [birth year] , 53 y.o.   MRN: [id number]   Chief Complaint  Patient presents with  . Diabetes    everything has been good    HPI Pt is here for DM FU and he forgot to got to get his xray.  Average glucose 180 fasting, post prandial 163. He wrote down his food diary which shows he eats toast with some kind or protein every days, snacks on cereal or cookies. Has been doing much better in regards to less frequent BM's since on the Metformin XR   Past Medical History:  Diagnosis Date  . Arthritis    SHOULDERS  . Balanitis   . Deafness, sensorineural, childhood onset    BILATERAL SINCE AGE 29  SECONDARY TO UNKNOWN ILLNESS  . GERD (gastroesophageal reflux disease)   . Hyperlipidemia   . Hypertension   . OSA on CPAP    SEVERE PER STUDY 03-09-2009  . Sleep apnea    uses a cpap  . Smokers' cough ([place])   . Type 2 diabetes mellitus (HCC)      Family History  Problem Relation Age of Onset  . Cancer Mother        type unknown pt was 73 years old at that time  . Liver disease Father   . Healthy Brother   . Cancer Other         alot of family members have cancer but doesnt know the type     Current Outpatient Medications:  .  aspirin EC 81 MG tablet, Take 81 mg by mouth daily., Disp: , Rfl:  .  atorvastatin (LIPITOR) 40 MG tablet, daily., Disp: , Rfl:  .  carvedilol (COREG) 6.25 MG tablet, TAKE 1 TABLET BY MOUTH TWICE DAILY WITH FOOD, Disp: 180 tablet, Rfl: 1 .  fenofibrate (TRICOR) 145 MG tablet, TAKE 1 TABLET BY MOUTH DAILY, Disp: 90 tablet, Rfl: 1 .  glipiZIDE (GLUCOTROL) 10 MG tablet, 1/2 in am before breakfast, and one with dinner, Disp: 60 tablet, Rfl: 11 .  glipiZIDE (GLUCOTROL) 5 MG tablet, 2 (two) times daily., Disp: , Rfl:  .  lisinopril (PRINIVIL,ZESTRIL) 20 MG tablet, TAKE 1 TABLET BY MOUTH ONCE DAILY, Disp: 90 tablet, Rfl: 1 .  metFORMIN (GLUCOPHAGE XR) 500 MG 24 hr tablet, 2 BID WITH BREAKFAST AND DINNER, Disp:  120 tablet, Rfl: 0 .  metFORMIN (GLUCOPHAGE) 500 MG tablet, Take 2 tablets by mouth twice a day, Disp: 120 tablet, Rfl: 2 .  modafinil (PROVIGIL) 200 MG tablet, Take 1 tablet (200 mg total) by mouth daily., Disp: 30 tablet, Rfl: 5 .  Multiple Vitamins-Minerals (MULTIVITAMIN WITH MINERALS) tablet, Take 1 tablet by mouth daily., Disp: , Rfl:  .  Niacin, Antihyperlipidemic, (NIACIN ER, ANTIHYPERLIPIDEMIC, PO), Take 2,000 mg by mouth at bedtime. , Disp: , Rfl:  .  omeprazole (PRILOSEC) 10 MG capsule, TAKE 1 CAPSULE BY MOUTH EVERY DAY BEFORE MEAL, Disp: 90 capsule, Rfl: 1   No Known Allergies   Review of Systems  Constitutional: Positive for diaphoresis.       Sweats when glucose is high  Respiratory: Negative for chest tightness and shortness of breath.   Cardiovascular: Negative for chest pain.  Gastrointestinal: Negative for constipation and diarrhea.  Genitourinary: Negative for difficulty urinating.  Skin: Negative for rash and wound.     Today's Vitals   11/15/18 0844  BP: 118/68  Pulse: 71  Temp: 97.8 F (36.6 C)  TempSrc: Oral  SpO2: 96%  Weight: 255 lb 3.2 oz (115.8 kg)  Height: 5\' 3"  (1.6 m)   Body mass index is 45.21 kg/m.   Objective:  Physical Exam   Constitutional: She is oriented to person, place, and time. She appears well-developed and well-nourished. No distress.  HENT:  Head: Normocephalic and atraumatic.  Right Ear: External ear normal.  Left Ear: External ear normal.  Nose: Nose normal.  Eyes: Conjunctivae are normal. Right eye exhibits no discharge. Left eye exhibits no discharge. No scleral icterus.  Neck: Neck supple. No thyromegaly present.  No carotid bruits bilaterally  Cardiovascular: Normal rate and regular rhythm.  No murmur heard. Pulmonary/Chest: Effort normal and breath sounds normal. No respiratory distress.  Musculoskeletal: Normal range of motion. She exhibits no edema.  Lymphadenopathy: he has no cervical adenopathy.  Neurological: She  is alert and oriented to person, place, and time.  Skin: Skin is warm and dry. Capillary refill takes less than 2 seconds. No rash noted. She is not diaphoretic.  Psychiatric: She has a normal mood and affect. Her behavior is normal. Judgment and thought content normal.  Nursing note reviewed.   Assessment And Plan:  1. Uncontrolled type 2 diabetes mellitus with hyperglycemia ([place])- slightly improving. I increased his glipizine to 10 mg bid with meals and will continue with the Metformin XR 1000 mg bid.   2. Essential hypertension- stable. May continue same meds.     CMP prdered.  FU 4 weeks.   Goal- instead of 2 toasts will cut down to 1.      Tyana Butzer RODRIGUEZ-SOUTHWORTH, PA-C

## 2018-11-15 NOTE — Patient Instructions (Addendum)
Try Spaggetti squash instead of pasta for spaggetti.   Try to look up for pizza crust made out of colliflower. Go to [employer] to find this and cheese sticks made out of this.   Try using lettuce leaf instead of a bun when eating a hamburger. Choose fresh vegetables instead of potatoe chips   Avoid bread or cereal and instead eat 1 cup of  blueberries and any other berries and grapefruit is fine as well.   Snack on cheese sticks, almonds, pistachio and walnuts etc, about 11 of them.   Try to eat more salads( with olive oil and vinegar) instead of dressings that have corn syrup which will raise your blood sugar.   I will increase your glipizide to 10 mg with meals twice a day. Stay on the same dose of the Metformin XR 1000 mg each dose twice a day with breakfast and dinner.   Continue glucose checks and bring your machine next time, and do the food diaries three times a week only.

## 2018-11-16 LAB — CMP14 + ANION GAP
ALT: 45 IU/L — ABNORMAL HIGH (ref 0–44)
AST: 40 IU/L (ref 0–40)
Albumin/Globulin Ratio: 2.3 — ABNORMAL HIGH (ref 1.2–2.2)
Albumin: 4.9 g/dL (ref 3.8–4.9)
Alkaline Phosphatase: 77 IU/L (ref 39–117)
Anion Gap: 18 mmol/L (ref 10.0–18.0)
BUN/Creatinine Ratio: 17 (ref 9–20)
BUN: 16 mg/dL (ref 6–24)
Bilirubin Total: 0.7 mg/dL (ref 0.0–1.2)
CO2: 21 mmol/L (ref 20–29)
Calcium: 9.6 mg/dL (ref 8.7–10.2)
Chloride: 100 mmol/L (ref 96–106)
Creatinine, Ser: 0.92 mg/dL (ref 0.76–1.27)
GFR calc Af Amer: 111 mL/min/{1.73_m2} (ref >59)
GFR calc non Af Amer: 96 mL/min/{1.73_m2} (ref >59)
GLUCOSE: 197 mg/dL — AB (ref 65–99)
Globulin, Total: 2.1 g/dL (ref 1.5–4.5)
Potassium: 4.4 mmol/L (ref 3.5–5.2)
Sodium: 139 mmol/L (ref 134–144)
Total Protein: 7 g/dL (ref 6.0–8.5)

## 2018-11-17 ENCOUNTER — Other Ambulatory Visit: Payer: Self-pay

## 2018-11-17 DIAGNOSIS — E1140 Type 2 diabetes mellitus with diabetic neuropathy, unspecified: Secondary | ICD-10-CM

## 2018-11-17 MED ORDER — GLIPIZIDE 10 MG PO TABS: 10.0000 mg | ORAL_TABLET | Freq: Two times a day (BID) | ORAL | 0 refills | Status: DC

## 2018-11-18 ENCOUNTER — Telehealth: Payer: Self-pay

## 2018-11-18 NOTE — Telephone Encounter (Signed)
Left message for pt to call back for results.

## 2018-11-18 NOTE — Telephone Encounter (Signed)
-----   Message from Shelby Mattocks, [place] sent at 11/16/2018  2:52 PM EST ----- Please call pt back and inform him that his kidney function is still normal, but his liver function is back to  Slightly abnormal,  And the glipizide can also cause this elevation, so tell him to not take the increased dose of the glipizide like I said this week 10 mg bid, but stay on what he was 5 mg in am, 10 mg with dinner. 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 Then on the second week of the Ozempic, he can get off the Glipizide.

## 2018-11-21 ENCOUNTER — Telehealth: Payer: Self-pay

## 2018-11-22 ENCOUNTER — Other Ambulatory Visit: Payer: Self-pay

## 2018-11-22 ENCOUNTER — Ambulatory Visit (INDEPENDENT_AMBULATORY_CARE_PROVIDER_SITE_OTHER): Payer: Medicare PPO | Admitting: Internal Medicine

## 2018-11-22 VITALS — BP 128/60 | HR 76 | Temp 98.2°F | Ht 63.8 in | Wt 252.2 lb

## 2018-11-22 DIAGNOSIS — Z7189 Other specified counseling: Secondary | ICD-10-CM

## 2018-11-22 DIAGNOSIS — E1140 Type 2 diabetes mellitus with diabetic neuropathy, unspecified: Secondary | ICD-10-CM

## 2018-11-22 NOTE — Telephone Encounter (Signed)
error 

## 2018-11-22 NOTE — Progress Notes (Signed)
Completed ozempic training. Gave 10 click

## 2018-11-29 IMAGING — US US ABDOMEN LIMITED
1 series · 14 of 25 positions shown · non-contrast
Comparison: CT abdomen and pelvis 03/10/2017

CLINICAL DATA: Cirrhosis, hypertension, type II diabetes mellitus,
former smoker

EXAM:
ULTRASOUND ABDOMEN LIMITED RIGHT UPPER QUADRANT

[Series 1: us abdomen limited · 14 of 59 slices shown]
[im 1/59]
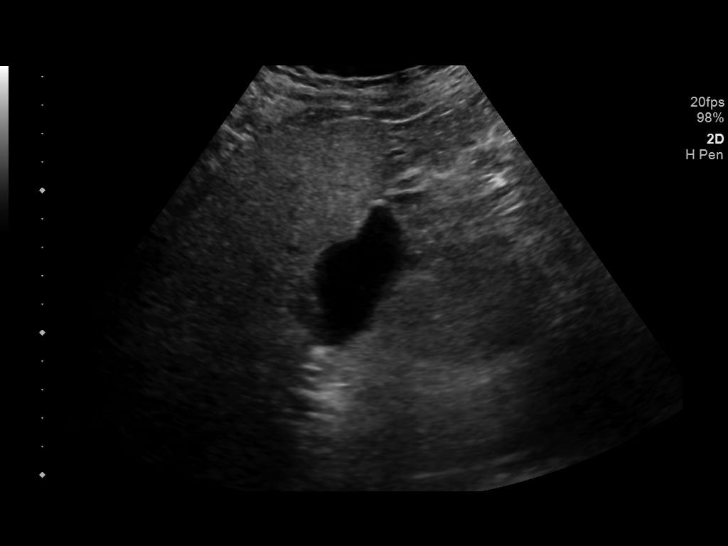
[im 5/59]
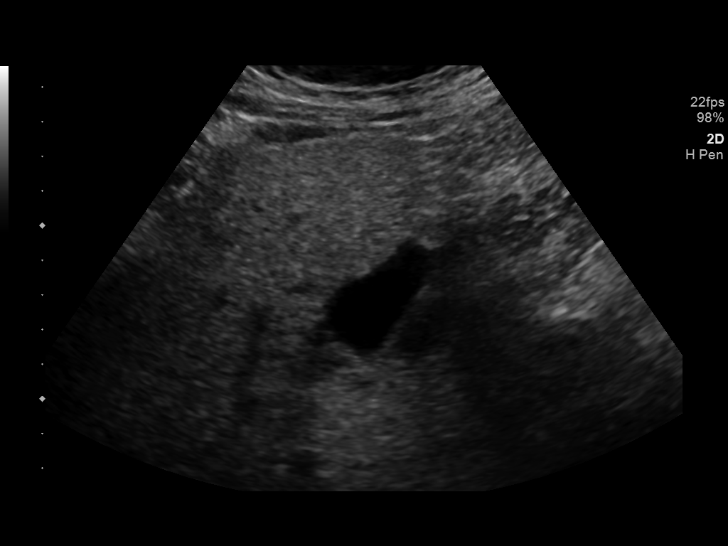
[im 10/59]
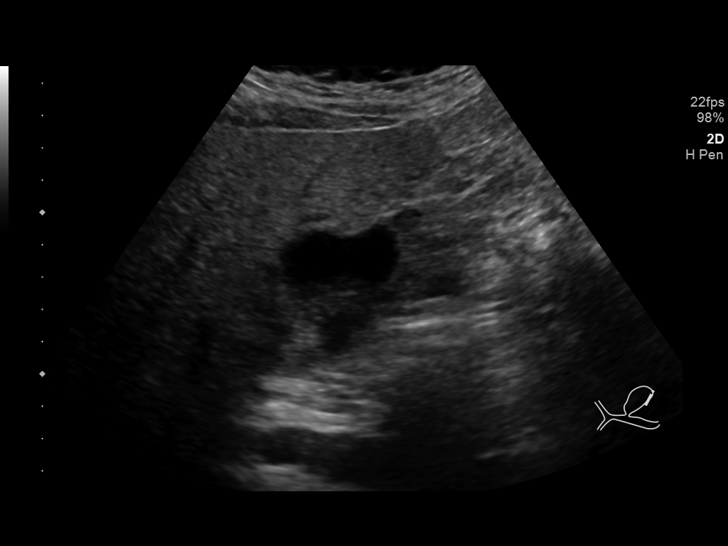
[im 15/59]
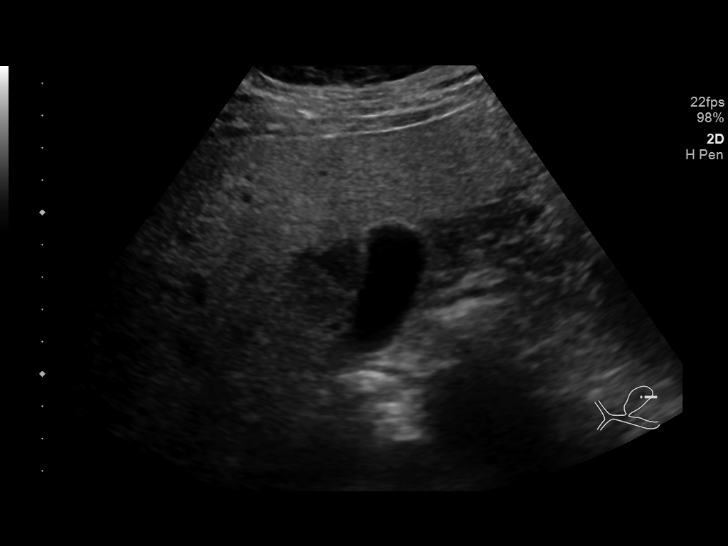
[im 20/59]
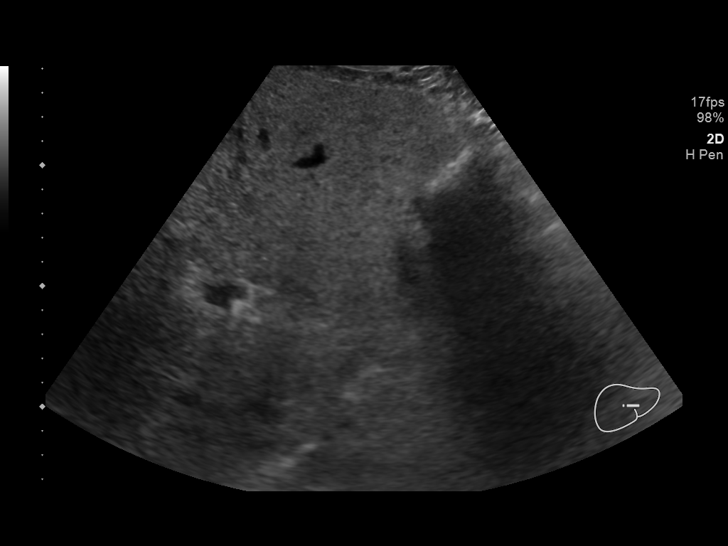
[im 22/59]
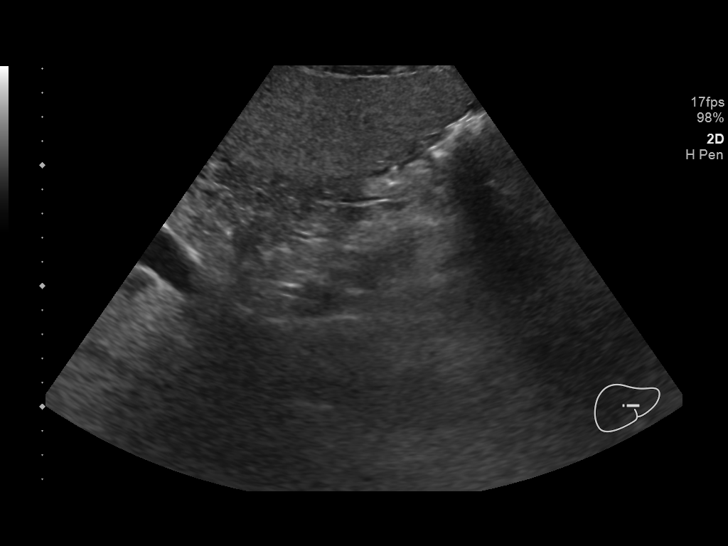
[im 27/59]
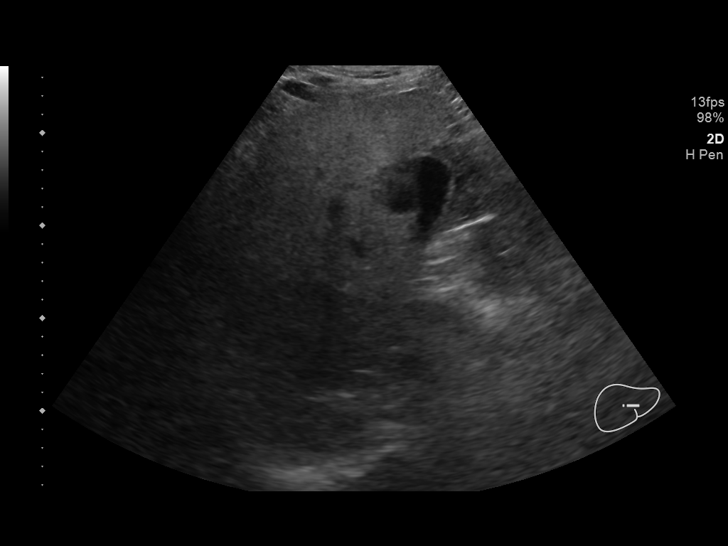
[im 32/59]
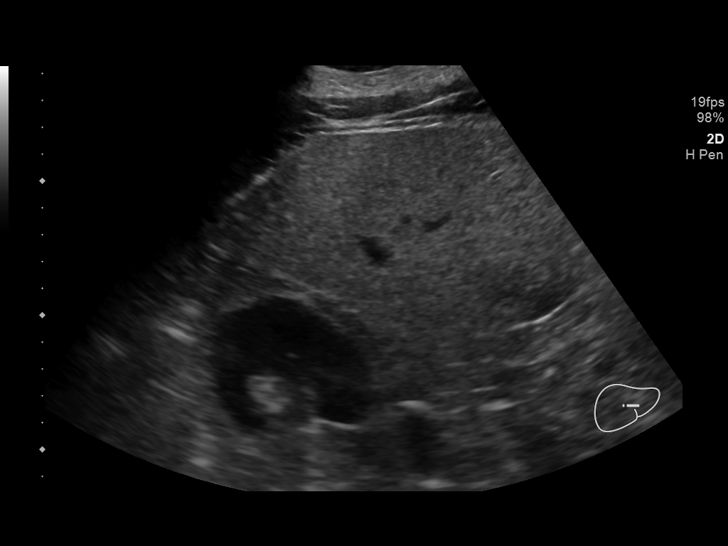
[im 37/59]
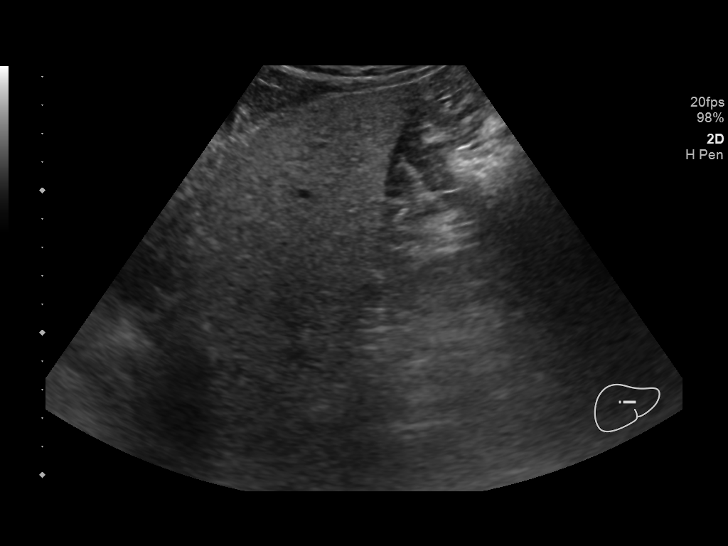
[im 39/59]
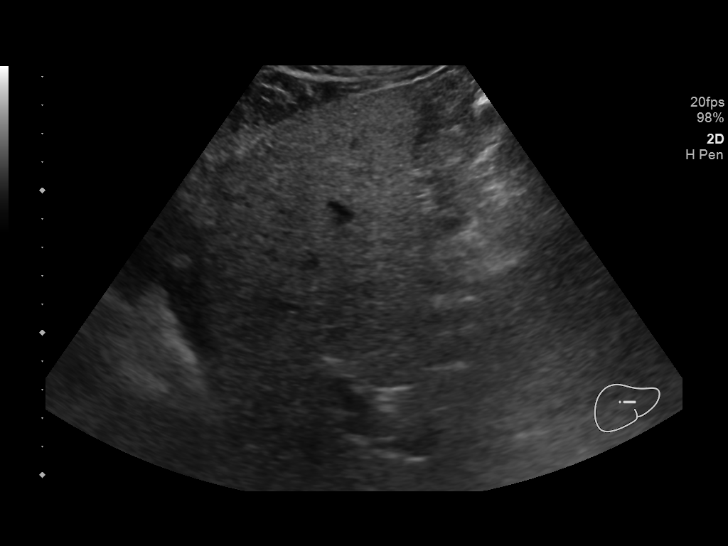
[im 44/59]
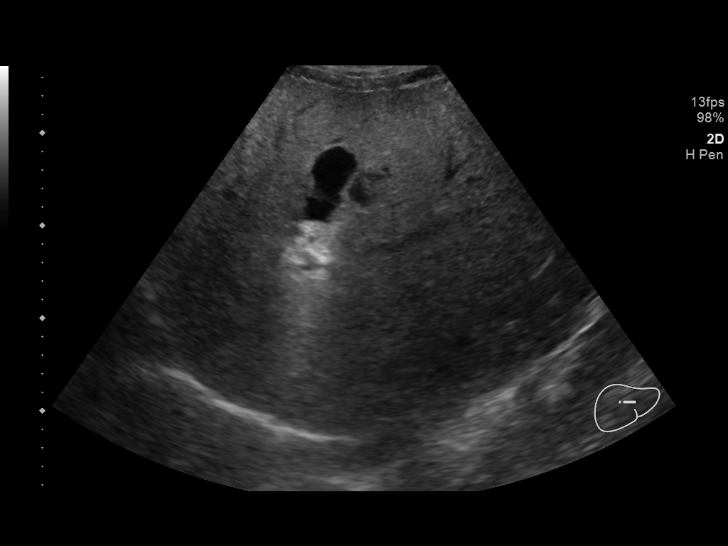
[im 49/59]
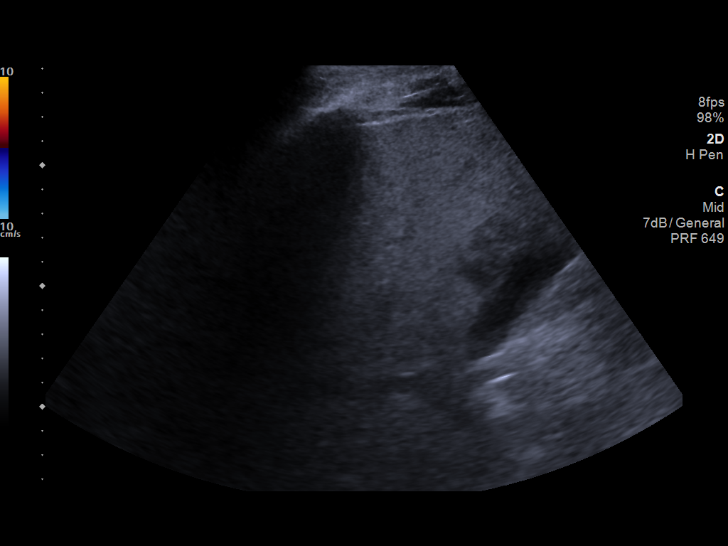
[im 54/59]
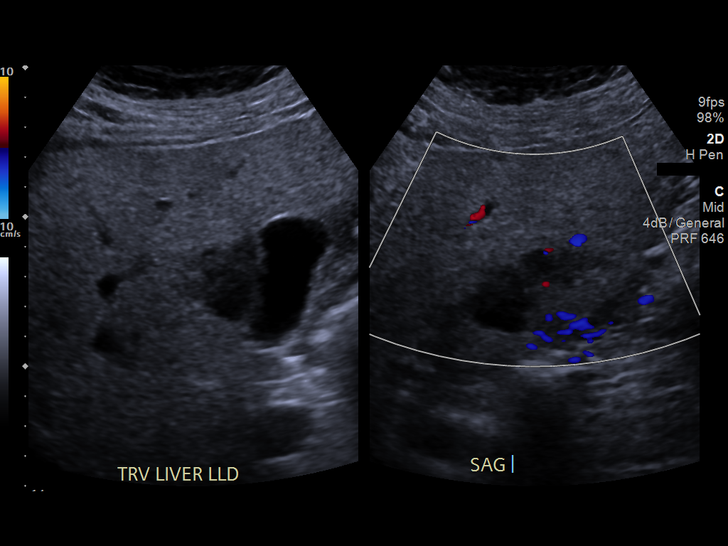
[im 59/59]
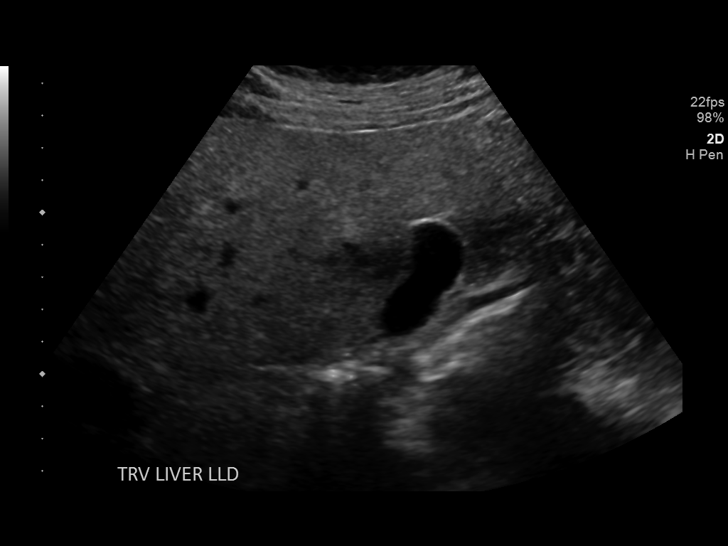

[14 of 25 positions shown; findings below may reference images not displayed]

FINDINGS: Gallbladder:

Slightly irregular. No definite wall thickening, stones, or
sonographic Murphy sign.

Common bile duct:

Diameter: 3 mm , normal

Liver:

Increased hepatic echogenicity diffusely question fatty infiltration
though this can be seen with cirrhosis and certain infiltrative
disorders. Hepatic margins appear smooth. Hypoechoic region adjacent
to gallbladder fossa consistent with focal sparing, seen on prior CT
as well. No definite hepatic mass or nodularity otherwise
identified. Portal vein is patent on color Doppler imaging with
normal direction of blood flow towards the liver.

No RIGHT upper quadrant free fluid.
IMPRESSION: Increased hepatic echogenicity which can be seen with fatty
infiltration and cirrhosis.

Probable area of focal sparing adjacent to gallbladder fossa.

No definite hepatic mass or nodularity.

## 2018-12-02 ENCOUNTER — Encounter: Payer: Self-pay | Admitting: Internal Medicine

## 2018-12-08 ENCOUNTER — Encounter: Payer: Self-pay | Admitting: Internal Medicine

## 2018-12-13 ENCOUNTER — Telehealth: Payer: Self-pay | Admitting: Internal Medicine

## 2018-12-13 ENCOUNTER — Other Ambulatory Visit: Payer: Self-pay | Admitting: Internal Medicine

## 2018-12-13 NOTE — Progress Notes (Unsigned)
Per Irine SealDamaris Schooner with patient wife about appointment due to [place] has stop all interpreting services from coming to practices due to the COVID-19 the appointment was cancel and they will come in June. Patient wife was concerned because her husband started a new injection for his DM and she said it doesn't/t seem to be regulating his BS. She would like a call back from the provider to see what they need to do.  Tianna please call pt and find out the following: 1- what are his fasting glucose and after eating for the past 7 days"  2- what day did he inject the Ozempic? If less than 2 weeks, they need to give it time. If he has given himself 2 injections by now, he may increase to 0.5 as long as he is tolerating this.   3- Have him give you the list of foods he has consumed in the past week.   Thanks,         Sunday Spillers

## 2018-12-13 NOTE — Telephone Encounter (Signed)
Spoke with patient wife about appointment due to [place] has stop all interpreting services from coming to practices due to the COVID-19 the appointment was cancel and they will come in June. Patient wife was concerned because her husband started a new injection for his DM and she said it doesn't/t seem to be regulating his BS. She would like a call back from the provider to see what they need to do.

## 2018-12-14 ENCOUNTER — Ambulatory Visit: Payer: Medicare PPO | Admitting: Internal Medicine

## 2018-12-15 ENCOUNTER — Encounter: Payer: Self-pay | Admitting: Internal Medicine

## 2018-12-16 ENCOUNTER — Telehealth: Payer: Self-pay

## 2018-12-16 NOTE — Telephone Encounter (Signed)
Left message   Per Irine Seal: Spoke with patient wife about appointment due to Stephen [place] has stop all interpreting services from coming to practices due to the COVID-19 the appointment was cancel and they will come in June. Patient wife was concerned because her husband started a new injection for his DM and she said it doesn't/t seem to be regulating his BS. She would like a call back from the provider to see what they need to do.  Stephen Chase please call pt and find out the following: 1- what are his fasting glucose and after eating for the past 7 days"  2- what day did he inject the Ozempic? If less than 2 weeks, they need to give it time. If he has given himself 2 injections by now, he may increase to 0.5 as long as he is tolerating this.   3- Have him give you the list of foods he has consumed in the past week.   Thanks,         Sunday Spillers

## 2018-12-22 ENCOUNTER — Encounter: Payer: Self-pay | Admitting: Internal Medicine

## 2018-12-23 ENCOUNTER — Other Ambulatory Visit: Payer: Self-pay

## 2018-12-23 MED ORDER — PEN NEEDLES 32G X 4 MM MISC: 1.0000 | Freq: Every day | 2 refills | Status: DC

## 2019-01-03 ENCOUNTER — Other Ambulatory Visit: Payer: Self-pay

## 2019-01-03 ENCOUNTER — Telehealth: Payer: Self-pay

## 2019-01-03 ENCOUNTER — Encounter: Payer: Self-pay | Admitting: Internal Medicine

## 2019-01-03 MED ORDER — SEMAGLUTIDE(0.25 OR 0.5MG/DOS) 2 MG/1.5ML ~~LOC~~ SOPN: 0.5000 mg | PEN_INJECTOR | SUBCUTANEOUS | 1 refills | Status: DC

## 2019-01-03 NOTE — Telephone Encounter (Signed)
Left voicemail for pt to call back. Need to know how his fasting glucose readings are and if he has changed his diet

## 2019-01-04 ENCOUNTER — Other Ambulatory Visit: Payer: Self-pay

## 2019-01-04 ENCOUNTER — Telehealth: Payer: Self-pay

## 2019-01-04 MED ORDER — SEMAGLUTIDE(0.25 OR 0.5MG/DOS) 2 MG/1.5ML ~~LOC~~ SOPN: 0.5000 mg | PEN_INJECTOR | SUBCUTANEOUS | 1 refills | Status: DC

## 2019-01-04 NOTE — Telephone Encounter (Signed)
Pt left message stating that he was having pain and needed a refill on his pain medication but OZEMPIC was spelled out I sent the refill for ozempic but I called to clarify if he was having pain so I could make a virtual visit. A message was left for pt to call back to clarify

## 2019-01-12 ENCOUNTER — Other Ambulatory Visit: Payer: Self-pay | Admitting: Internal Medicine

## 2019-01-23 ENCOUNTER — Encounter: Payer: Self-pay | Admitting: Internal Medicine

## 2019-01-24 ENCOUNTER — Encounter: Payer: Self-pay | Admitting: Gastroenterology

## 2019-01-30 ENCOUNTER — Other Ambulatory Visit: Payer: Self-pay | Admitting: Nurse Practitioner

## 2019-02-07 ENCOUNTER — Other Ambulatory Visit: Payer: Self-pay | Admitting: Internal Medicine

## 2019-02-07 ENCOUNTER — Encounter: Payer: Self-pay | Admitting: Internal Medicine

## 2019-02-14 ENCOUNTER — Other Ambulatory Visit: Payer: Self-pay | Admitting: Internal Medicine

## 2019-02-17 ENCOUNTER — Encounter: Payer: Self-pay | Admitting: Internal Medicine

## 2019-02-19 ENCOUNTER — Other Ambulatory Visit: Payer: Self-pay | Admitting: Nurse Practitioner

## 2019-02-19 ENCOUNTER — Other Ambulatory Visit: Payer: Self-pay | Admitting: Internal Medicine

## 2019-02-27 ENCOUNTER — Encounter: Payer: Self-pay | Admitting: Internal Medicine

## 2019-02-28 ENCOUNTER — Other Ambulatory Visit: Payer: Self-pay

## 2019-02-28 ENCOUNTER — Telehealth: Payer: Self-pay

## 2019-02-28 MED ORDER — SEMAGLUTIDE(0.25 OR 0.5MG/DOS) 2 MG/1.5ML ~~LOC~~ SOPN: 0.5000 mg | PEN_INJECTOR | SUBCUTANEOUS | 1 refills | Status: DC

## 2019-02-28 NOTE — Telephone Encounter (Signed)
Patient wife called, he is out of insulin, asked if you can send in a refill also call to let her know refill has been sent

## 2019-03-01 ENCOUNTER — Encounter: Payer: Medicare HMO | Admitting: Internal Medicine

## 2019-03-01 ENCOUNTER — Ambulatory Visit: Payer: Medicare HMO

## 2019-03-01 DIAGNOSIS — G4733 Obstructive sleep apnea (adult) (pediatric): Secondary | ICD-10-CM | POA: Diagnosis not present

## 2019-03-09 ENCOUNTER — Encounter: Payer: Self-pay | Admitting: Internal Medicine

## 2019-03-09 ENCOUNTER — Ambulatory Visit (INDEPENDENT_AMBULATORY_CARE_PROVIDER_SITE_OTHER): Payer: Medicare PPO

## 2019-03-09 ENCOUNTER — Other Ambulatory Visit: Payer: Self-pay

## 2019-03-09 ENCOUNTER — Ambulatory Visit (INDEPENDENT_AMBULATORY_CARE_PROVIDER_SITE_OTHER): Payer: Medicare PPO | Admitting: Internal Medicine

## 2019-03-09 VITALS — BP 120/70 | HR 81 | Temp 98.3°F | Ht 67.0 in | Wt 230.4 lb

## 2019-03-09 VITALS — BP 120/70 | HR 81 | Temp 98.3°F | Ht 67.0 in | Wt 230.0 lb

## 2019-03-09 DIAGNOSIS — E1140 Type 2 diabetes mellitus with diabetic neuropathy, unspecified: Secondary | ICD-10-CM

## 2019-03-09 DIAGNOSIS — Z0000 Encounter for general adult medical examination without abnormal findings: Secondary | ICD-10-CM

## 2019-03-09 DIAGNOSIS — Z0182 Encounter for allergy testing: Secondary | ICD-10-CM | POA: Diagnosis not present

## 2019-03-09 DIAGNOSIS — Z1211 Encounter for screening for malignant neoplasm of colon: Secondary | ICD-10-CM | POA: Diagnosis not present

## 2019-03-09 DIAGNOSIS — Z114 Encounter for screening for human immunodeficiency virus [HIV]: Secondary | ICD-10-CM

## 2019-03-09 DIAGNOSIS — R1310 Dysphagia, unspecified: Secondary | ICD-10-CM

## 2019-03-09 DIAGNOSIS — Z91018 Allergy to other foods: Secondary | ICD-10-CM | POA: Diagnosis not present

## 2019-03-09 DIAGNOSIS — Z91010 Allergy to peanuts: Secondary | ICD-10-CM

## 2019-03-09 DIAGNOSIS — Z125 Encounter for screening for malignant neoplasm of prostate: Secondary | ICD-10-CM | POA: Diagnosis not present

## 2019-03-09 DIAGNOSIS — R10816 Epigastric abdominal tenderness: Secondary | ICD-10-CM

## 2019-03-09 DIAGNOSIS — E782 Mixed hyperlipidemia: Secondary | ICD-10-CM | POA: Diagnosis not present

## 2019-03-09 DIAGNOSIS — I10 Essential (primary) hypertension: Secondary | ICD-10-CM

## 2019-03-09 DIAGNOSIS — R1319 Other dysphagia: Secondary | ICD-10-CM

## 2019-03-09 LAB — POCT URINALYSIS DIPSTICK
Bilirubin, UA: NEGATIVE
Blood, UA: NEGATIVE
Glucose, UA: NEGATIVE
Ketones, UA: NEGATIVE
Leukocytes, UA: NEGATIVE
Nitrite, UA: NEGATIVE
Protein, UA: NEGATIVE
Spec Grav, UA: 1.025 (ref 1.010–1.025)
Urobilinogen, UA: 0.2 E.U./dL
pH, UA: 5 (ref 5.0–8.0)

## 2019-03-09 LAB — POCT UA - MICROALBUMIN
Albumin/Creatinine Ratio, Urine, POC: <30
Creatinine, POC: 200 mg/dL
Microalbumin Ur, POC: 30 mg/L

## 2019-03-09 NOTE — Progress Notes (Addendum)
Subjective:     Patient ID: Stephen Chase , male    DOB: 1966/05/29 , 53 y.o.   MRN: [id number]   Chief Complaint  Patient presents with  . Annual Exam    HPI Pt is here for yearly physical and DM FU and Medicare wellness visit by or LPN Ardelle [place]. Has been loosing wt. Since he has been on the [place]. His portions have diminished.  His glucose readings are as follows Exercise: works in land scapping.  His glucose diary is as follows:   May 1  Breakfast [place]  No check up  [place]  24  Breakfast 149  Dinner n/a  25  Breakfast 167  Dinner n/a  Breakfast  26  Breakfast n/a  Dinner 113  27  Breakfast [place]   18  fasting 164   Past Medical History:  Diagnosis Date  . Arthritis    SHOULDERS  . Balanitis   . Deafness, sensorineural, childhood onset    BILATERAL SINCE AGE 6  SECONDARY TO UNKNOWN ILLNESS  . GERD (gastroesophageal reflux disease)   . Hyperlipidemia   . Hypertension   . OSA on CPAP    SEVERE PER STUDY 03-09-2009  . Sleep apnea    uses a cpap  . Smokers' cough ([place])   . Type 2 diabetes mellitus (HCC)      Family History  Problem Relation Age of Onset  . Cancer Mother        type unknown pt was 38 years old at that time  . Liver disease Father   . Healthy Brother   . Cancer Other  alot of family members have cancer but doesnt know the type     Current Outpatient Medications:  .  ACCU-CHEK GUIDE test strip, CHECK BLOOD SUGAR 3 TIMES DAILY BEFORE BREAKFAST, LUNCH AND DINNER, Disp: 150 each, Rfl: 1 .  aspirin EC 81 MG tablet, Take 81 mg by mouth daily., Disp: , Rfl:  .  atorvastatin (LIPITOR) 40 MG tablet, TAKE 1 TABLET BY MOUTH AT BEDTIME, Disp: 90 tablet, Rfl: 0 .  carvedilol (COREG) 6.25 MG tablet, TAKE 1 TABLET BY MOUTH TWICE DAILY WITH FOOD, Disp: 180 tablet, Rfl: 1 .  fenofibrate (TRICOR) 145 MG tablet, TAKE 1 TABLET BY MOUTH DAILY, Disp: 90 tablet, Rfl: 1 .  glipiZIDE (GLUCOTROL) 10 MG tablet, Take 1 tablet (10 mg total) by mouth 2 (two) times daily before a meal. (Patient not taking: Reported on 03/09/2019), Disp: 60 tablet, Rfl: 0 .  Insulin Pen Needle (PEN NEEDLES) 32G X 4 MM MISC, Inject 1 each into the skin daily., Disp: 30 each, Rfl: 2 .  lisinopril (ZESTRIL) 20 MG tablet, TAKE 1 TABLET BY MOUTH EVERY DAY, Disp: 90 tablet, Rfl: 1 .  metFORMIN (GLUCOPHAGE XR) 500 MG 24 hr tablet, 2 BID WITH BREAKFAST AND DINNER, Disp: 120 tablet, Rfl: 0 .  modafinil (PROVIGIL) 200 MG tablet, Take 1 tablet (200 mg total) by mouth daily., Disp: 30 tablet, Rfl: 5 .  Multiple Vitamins-Minerals (MULTIVITAMIN WITH MINERALS) tablet, Take 1 tablet by mouth daily., Disp: , Rfl:   .  Niacin, Antihyperlipidemic, (NIACIN ER, ANTIHYPERLIPIDEMIC, PO), Take 2,000 mg by mouth at bedtime. , Disp: , Rfl:  .  omeprazole (PRILOSEC) 10 MG capsule, TAKE 1 CAPSULE BY MOUTH EVERY DAY BEFORE A MEAL, Disp: 90 capsule, Rfl: 1 .  Semaglutide,0.25 or 0.5MG /DOS, (OZEMPIC, 0.25 OR 0.5 MG/DOSE,) 2 MG/1.5ML SOPN, Inject 0.5 mg into the skin once a week., Disp: 3 pen, Rfl: 1 .  tamsulosin (FLOMAX) 0.4 MG CAPS capsule, TAKE 1 CAPSULE BY MOUTH ONCE DAILY 1/2 HOUR FOLLOWING THE SAME MEAL EACH DAY, Disp: 90 capsule, Rfl: 1   No Known Allergies   Review of Systems  Constitutional: Positive for diaphoresis. Negative for chills and fever.       Only occasional sweating and not as often as before.   HENT: Negative.   Eyes: Negative.        He lost his glasses and will see his eye Dr this month.   Respiratory: Negative for cough, chest tightness, shortness of breath and wheezing.   Cardiovascular: Negative for chest pain, palpitations and leg swelling.  Gastrointestinal: Negative for abdominal distention, abdominal pain, blood in stool, constipation, diarrhea, nausea, rectal pain and vomiting.       When he eats nuts feels up to the next day, they are stuck on his throat and has to drink a lot of water. This has been going on x 1y  Genitourinary: Negative for discharge, dysuria and frequency.       Some impotency with sometimes inability to have an erection. Declines urology.   Musculoskeletal: Positive for arthralgias. Negative for back pain and joint swelling.       Gets intermittent L shoulder, had R shoulder surgery, L foot aches.   Skin: Negative for rash and wound.  Allergic/Immunologic: Positive for food allergies. Negative for environmental allergies.       Some nuts cause itching in his throat, but he does not know which one since he eats mixed nuts.   Neurological: Positive for dizziness. Negative for numbness and headaches.  When working outside sweats gets orthostatic. Only drinks  a lot of water.      Today's Vitals   03/09/19 1149  BP: 120/70  Pulse: 81  Temp: 98.3 F (36.8 C)  TempSrc: Oral  SpO2: 97%  Weight: 230 lb 6.4 oz (104.5 kg)  Height: 5\' 7"  (1.702 m)   Body mass index is 36.09 kg/m.   Objective:  Physical Exam  Wt is down 22 lbs since March this year.    BP 120/70 (BP Location: Left Arm, Patient Position: Sitting, Cuff Size: Normal)   Pulse 81   Temp 98.3 F (36.8 C) (Oral)   Ht 5\' 7"  (1.702 m)   Wt 230 lb 6.4 oz (104.5 kg)   SpO2 97%   BMI 36.09 kg/m   General Appearance:    Alert, cooperative, no distress, appears stated age  Head:    Normocephalic, without obvious abnormality, atraumatic  Eyes:    PERRL, conjunctiva/corneas clear, EOM's intact, fundi    benign, both eyes       Ears:    Normal TM's and external ear canals, both ears  Nose:   Nares normal, septum midline, mucosa normal, no drainage   or sinus tenderness  Throat:   Lips, mucosa, and tongue normal; teeth and gums normal  Neck:   Supple, symmetrical, trachea midline, no adenopathy;       thyroid:  No enlargement/tenderness/nodules.   Back:     Symmetric, no curvature, ROM normal, no CVA tenderness  Lungs:     Clear to auscultation bilaterally, respirations unlabored  Chest wall:    No tenderness or deformity  Heart:    Regular rate and rhythm, S1 and S2 normal, no murmur, rub   or gallop  Abdomen:     Soft, has mild epigastric tenderness and LLQ tenderness with no guarding or rebound, bowel sounds active all four quadrants,    no masses, no organomegaly  Genitalia:    Normal male without lesion, discharge or tenderness  Rectal:    Normal tone, normal prostate, no masses or tenderness;   guaiac negative stool  Extremities:   Extremities normal, atraumatic, no cyanosis or edema  Pulses:   2+ and symmetric all extremities  Skin:   Skin color, texture, turgor normal, no rashes or lesions  Lymph nodes:   Cervical, supraclavicular, and axillary nodes normal  Neurologic:    CNII-XII intact. Normal strength, sensation, has hyporeflexes    Throughout. Normal [place], heel to toe gait, and tandem gait.   EKG- NORMAL Sinus Assessment And Plan:   1. Type 2 diabetes mellitus with diabetic neuropathy, without long-term current use of insulin ([place])- with improving numbers. He is to continue same med. FU 3 months - Hemoglobin A1c  2. Essential hypertension- stable. May continue same med. Fu 3 months.  - CMP14 + Anion Gap  3. Mixed hyperlipidemia- chronic. May continue same med.  - Lipid Profile - TSH - T4, Free - T3, free 4. Dysphagia, unspecified type- chronic  - Ambulatory referral to Gastroenterology 5. Nut allergy- possibly - Allergens(96) Foods  8. Colon cancer screening- routine. Negative hemoccult.   9. Prostate cancer screening- routine.  - PSA  10. Routine medical exam- routine. FU 1 y.    11. Epigastric tenderness- mild. Sent to his GI for eval, may need endoscopy which he says he never had it done.    He was advised that when he is out in the heat sweating a lot, to drink at least one electrolyte  drink and push more fluids to hydrate better.   Bernetha Anschutz RODRIGUEZ-SOUTHWORTH, PA-C    THE PATIENT IS ENCOURAGED TO PRACTICE SOCIAL DISTANCING DUE TO THE COVID-19 PANDEMIC.

## 2019-03-09 NOTE — Patient Instructions (Signed)
Stephen Chase , Thank you for taking time to come for your Medicare Wellness Visit. I appreciate your ongoing commitment to your health goals. Please review the following plan we discussed and let me know if I can assist you in the future.   Screening recommendations/referrals: Colonoscopy: 01/2017 Recommended yearly ophthalmology/optometry visit for glaucoma screening and checkup Recommended yearly dental visit for hygiene and checkup  Vaccinations: Influenza vaccine: 06/2018 Pneumococcal vaccine: n/a Tdap vaccine: due Shingles vaccine: discussed    Advanced directives: Advance directive discussed with you today. Even though you declined this today please call our office should you change your mind and we can give you the proper paperwork for you to fill out.   Conditions/risks identified: obesity  Next appointment: 06/15/2019 at 9:30  Preventive Care 40-64 Years, Male Preventive care refers to lifestyle choices and visits with your health care provider that can promote health and wellness. What does preventive care include?  A yearly physical exam. This is also called an annual well check.  Dental exams once or twice a year.  Routine eye exams. Ask your health care provider how often you should have your eyes checked.  Personal lifestyle choices, including:  Daily care of your teeth and gums.  Regular physical activity.  Eating a healthy diet.  Avoiding tobacco and drug use.  Limiting alcohol use.  Practicing safe sex.  Taking low-dose aspirin every day starting at age 46. What happens during an annual well check? The services and screenings done by your health care provider during your annual well check will depend on your age, overall health, lifestyle risk factors, and family history of disease. Counseling  Your health care provider may ask you questions about your:  Alcohol use.  Tobacco use.  Drug use.  Emotional well-being.  Home and relationship  well-being.  Sexual activity.  Eating habits.  Work and work Statistician. Screening  You may have the following tests or measurements:  Height, weight, and BMI.  Blood pressure.  Lipid and cholesterol levels. These may be checked every 5 years, or more frequently if you are over 54 years old.  Skin check.  Lung cancer screening. You may have this screening every year starting at age 55 if you have a 30-pack-year history of smoking and currently smoke or have quit within the past 15 years.  Fecal occult blood test (FOBT) of the stool. You may have this test every year starting at age 69.  Flexible sigmoidoscopy or colonoscopy. You may have a sigmoidoscopy every 5 years or a colonoscopy every 10 years starting at age 20.  Prostate cancer screening. Recommendations will vary depending on your family history and other risks.  Hepatitis C blood test.  Hepatitis B blood test.  Sexually transmitted disease (STD) testing.  Diabetes screening. This is done by checking your blood sugar (glucose) after you have not eaten for a while (fasting). You may have this done every 1-3 years. Discuss your test results, treatment options, and if necessary, the need for more tests with your health care provider. Vaccines  Your health care provider may recommend certain vaccines, such as:  Influenza vaccine. This is recommended every year.  Tetanus, diphtheria, and acellular pertussis (Tdap, Td) vaccine. You may need a Td booster every 10 years.  Zoster vaccine. You may need this after age 40.  Pneumococcal 13-valent conjugate (PCV13) vaccine. You may need this if you have certain conditions and have not been vaccinated.  Pneumococcal polysaccharide (PPSV23) vaccine. You may need one or two  doses if you smoke cigarettes or if you have certain conditions. Talk to your health care provider about which screenings and vaccines you need and how often you need them. This information is not intended  to replace advice given to you by your health care provider. Make sure you discuss any questions you have with your health care provider. Document Released: 10/04/2015 Document Revised: 05/27/2016 Document Reviewed: 07/09/2015 Elsevier Interactive Patient Education  2017 [place] Prevention in the Home Falls can cause injuries. They can happen to people of all ages. There are many things you can do to make your home safe and to help prevent falls. What can I do on the outside of my home?  Regularly fix the edges of walkways and driveways and fix any cracks.  Remove anything that might make you trip as you walk through a door, such as a raised step or threshold.  Trim any bushes or trees on the path to your home.  Use bright outdoor lighting.  Clear any walking paths of anything that might make someone trip, such as rocks or tools.  Regularly check to see if handrails are loose or broken. Make sure that both sides of any steps have handrails.  Any raised decks and porches should have guardrails on the edges.  Have any leaves, snow, or ice cleared regularly.  Use sand or salt on walking paths during winter.  Clean up any spills in your garage right away. This includes oil or grease spills. What can I do in the bathroom?  Use night lights.  Install grab bars by the toilet and in the tub and shower. Do not use towel bars as grab bars.  Use non-skid mats or decals in the tub or shower.  If you need to sit down in the shower, use a plastic, non-slip stool.  Keep the floor dry. Clean up any water that spills on the floor as soon as it happens.  Remove soap buildup in the tub or shower regularly.  Attach bath mats securely with double-sided non-slip rug tape.  Do not have throw rugs and other things on the floor that can make you trip. What can I do in the bedroom?  Use night lights.  Make sure that you have a light by your bed that is easy to reach.  Do not use  any sheets or blankets that are too big for your bed. They should not hang down onto the floor.  Have a firm chair that has side arms. You can use this for support while you get dressed.  Do not have throw rugs and other things on the floor that can make you trip. What can I do in the kitchen?  Clean up any spills right away.  Avoid walking on wet floors.  Keep items that you use a lot in easy-to-reach places.  If you need to reach something above you, use a strong step stool that has a grab bar.  Keep electrical cords out of the way.  Do not use floor polish or wax that makes floors slippery. If you must use wax, use non-skid floor wax.  Do not have throw rugs and other things on the floor that can make you trip. What can I do with my stairs?  Do not leave any items on the stairs.  Make sure that there are handrails on both sides of the stairs and use them. Fix handrails that are broken or loose. Make sure that handrails are as  long as the stairways.  Check any carpeting to make sure that it is firmly attached to the stairs. Fix any carpet that is loose or worn.  Avoid having throw rugs at the top or bottom of the stairs. If you do have throw rugs, attach them to the floor with carpet tape.  Make sure that you have a light switch at the top of the stairs and the bottom of the stairs. If you do not have them, ask someone to add them for you. What else can I do to help prevent falls?  Wear shoes that:  Do not have high heels.  Have rubber bottoms.  Are comfortable and fit you well.  Are closed at the toe. Do not wear sandals.  If you use a stepladder:  Make sure that it is fully opened. Do not climb a closed stepladder.  Make sure that both sides of the stepladder are locked into place.  Ask someone to hold it for you, if possible.  Clearly mark and make sure that you can see:  Any grab bars or handrails.  First and last steps.  Where the edge of each step is.   Use tools that help you move around (mobility aids) if they are needed. These include:  Canes.  Walkers.  Scooters.  Crutches.  Turn on the lights when you go into a dark area. Replace any light bulbs as soon as they burn out.  Set up your furniture so you have a clear path. Avoid moving your furniture around.  If any of your floors are uneven, fix them.  If there are any pets around you, be aware of where they are.  Review your medicines with your doctor. Some medicines can make you feel dizzy. This can increase your chance of falling. Ask your doctor what other things that you can do to help prevent falls. This information is not intended to replace advice given to you by your health care provider. Make sure you discuss any questions you have with your health care provider. Document Released: 07/04/2009 Document Revised: 02/13/2016 Document Reviewed: 10/12/2014 Elsevier Interactive Patient Education  2017 [employer].

## 2019-03-09 NOTE — Progress Notes (Signed)
Subjective:   Stephen Chase is a 53 y.o. male who presents for Medicare Annual/Subsequent preventive examination.  Review of Systems:  n/a Cardiac Risk Factors include: hypertension;male gender;sedentary lifestyle;obesity (BMI >30kg/m2)     Objective:    Vitals: BP 120/70 (BP Location: Left Arm, Patient Position: Sitting, Cuff Size: Normal)   Pulse 81   Temp 98.3 F (36.8 C) (Oral)   Ht 5\' 7"  (1.702 m)   Wt 230 lb (104.3 kg)   BMI 36.02 kg/m   Body mass index is 36.02 kg/m.  Advanced Directives 03/09/2019 12/19/2013  Does Patient Have a Medical Advance Directive? No Patient does not have advance directive;Patient would like information  Would patient like information on creating a medical advance directive? No - Patient declined Advance directive packet given    Tobacco Social History   Tobacco Use  Smoking Status Former Smoker  . Packs/day: 1.00  . Years: 30.00  . Pack years: 30.00  . Types: Cigarettes  Smokeless Tobacco Never Used  Tobacco Comment   quit 4 years ago     Counseling given: Not Answered Comment: quit 4 years ago   Clinical Intake:  Pre-visit preparation completed: Yes  Pain : No/denies pain     Nutritional Risks: None Diabetes: Yes CBG done?: No Did pt. bring in CBG monitor from home?: No  How often do you need to have someone help you when you read instructions, pamphlets, or other written materials from your doctor or pharmacy?: 1 - Never What is the last grade level you completed in school?: 12th grade  Interpreter Needed?: Yes Interpreter Agency: [employer] Interpreter Name: Lenox Ahr  Information entered by :: NAllen LPN  Past Medical History:  Diagnosis Date  . Arthritis    SHOULDERS  . Balanitis   . Deafness, sensorineural, childhood onset    BILATERAL SINCE AGE 22  SECONDARY TO UNKNOWN ILLNESS  . GERD (gastroesophageal reflux disease)   . Hyperlipidemia   . Hypertension   . OSA on CPAP    SEVERE PER STUDY 03-09-2009   . Sleep apnea    uses a cpap  . Smokers' cough ([place])   . Type 2 diabetes mellitus ([place])    Past Surgical History:  Procedure Laterality Date  . CIRCUMCISION N/A 12/19/2013   Procedure: CIRCUMCISION ADULT;  Surgeon: Lowella Bandy, MD;  Location: [facility];  Service: Urology;  Laterality: N/A;  . WRIST SURGERY Left    Family History  Problem Relation Age of Onset  . Cancer Mother        type unknown pt was 57 years old at that time  . Liver disease Father   . Healthy Brother   . Cancer Other         alot of family members have cancer but doesnt know the type   Social History   Socioeconomic History  . Marital status: Married    Spouse name: Not on file  . Number of children: 2  . Years of education: Not on file  . Highest education level: Not on file  Occupational History  . Occupation: unemployed  Social Needs  . Financial resource strain: Not on file  . Food insecurity    Worry: Not on file    Inability: Not on file  . Transportation needs    Medical: Not on file    Non-medical: Not on file  Tobacco Use  . Smoking status: Former Smoker    Packs/day: 1.00    Years: 30.00  Pack years: 30.00    Types: Cigarettes  . Smokeless tobacco: Never Used  . Tobacco comment: quit 4 years ago  Substance and Sexual Activity  . Alcohol use: Yes    Comment: rarely  . Drug use: No  . Sexual activity: Not on file  Lifestyle  . Physical activity    Days per week: Not on file    Minutes per session: Not on file  . Stress: Not on file  Relationships  . Social Herbalist on phone: Not on file    Gets together: Not on file    Attends religious service: Not on file    Active member of club or organization: Not on file    Attends meetings of clubs or organizations: Not on file    Relationship status: Not on file  Other Topics Concern  . Not on file  Social History Narrative  . Not on file    Outpatient Encounter Medications as of 03/09/2019   Medication Sig  . ACCU-CHEK GUIDE test strip CHECK BLOOD SUGAR 3 TIMES DAILY BEFORE BREAKFAST, LUNCH AND DINNER  . aspirin EC 81 MG tablet Take 81 mg by mouth daily.  Marland Kitchen atorvastatin (LIPITOR) 40 MG tablet TAKE 1 TABLET BY MOUTH AT BEDTIME  . carvedilol (COREG) 6.25 MG tablet TAKE 1 TABLET BY MOUTH TWICE DAILY WITH FOOD  . fenofibrate (TRICOR) 145 MG tablet TAKE 1 TABLET BY MOUTH DAILY  . glipiZIDE (GLUCOTROL) 10 MG tablet Take 1 tablet (10 mg total) by mouth 2 (two) times daily before a meal. (Patient not taking: Reported on 03/09/2019)  . Insulin Pen Needle (PEN NEEDLES) 32G X 4 MM MISC Inject 1 each into the skin daily.  Marland Kitchen lisinopril (ZESTRIL) 20 MG tablet TAKE 1 TABLET BY MOUTH EVERY DAY  . metFORMIN (GLUCOPHAGE XR) 500 MG 24 hr tablet 2 BID WITH BREAKFAST AND DINNER  . modafinil (PROVIGIL) 200 MG tablet Take 1 tablet (200 mg total) by mouth daily.  . Multiple Vitamins-Minerals (MULTIVITAMIN WITH MINERALS) tablet Take 1 tablet by mouth daily.  . Niacin, Antihyperlipidemic, (NIACIN ER, ANTIHYPERLIPIDEMIC, PO) Take 2,000 mg by mouth at bedtime.   Marland Kitchen omeprazole (PRILOSEC) 10 MG capsule TAKE 1 CAPSULE BY MOUTH EVERY DAY BEFORE A MEAL  . Semaglutide,0.25 or 0.5MG /DOS, (OZEMPIC, 0.25 OR 0.5 MG/DOSE,) 2 MG/1.5ML SOPN Inject 0.5 mg into the skin once a week.  . tamsulosin (FLOMAX) 0.4 MG CAPS capsule TAKE 1 CAPSULE BY MOUTH ONCE DAILY 1/2 HOUR FOLLOWING THE SAME MEAL EACH DAY   No facility-administered encounter medications on file as of 03/09/2019.     Activities of Daily Living In your present state of health, do you have any difficulty performing the following activities: 03/09/2019  Hearing? Y  Vision? N  Difficulty concentrating or making decisions? N  Walking or climbing stairs? N  Dressing or bathing? N  Doing errands, shopping? N  Preparing Food and eating ? N  Using the Toilet? N  In the past six months, have you accidently leaked urine? N  Do you have problems with loss of bowel  control? N  Managing your Medications? N  Managing your Finances? N  Housekeeping or managing your Housekeeping? N  Some recent data might be hidden    Patient Care Team: Rodriguez-[place]worth, Sandrea Matte as PCP - General (Internal Medicine)   Assessment:   This is a routine wellness examination for Stephen Chase.  Exercise Activities and Dietary recommendations Current Exercise Habits: The patient does not participate in regular  exercise at present  Goals    . Weight (lb) < 200 lb (90.7 kg)     Wants to lose weight to get blood sugar under control       Fall Risk Fall Risk  03/09/2019 03/09/2019 11/22/2018 11/15/2018 10/25/2018  Falls in the past year? 0 0 0 0 0  Risk for fall due to : Medication side effect - - - -  Follow up Education provided;Falls prevention discussed - - - -   Is the patient's home free of loose throw rugs in walkways, pet beds, electrical cords, etc?   yes      Grab bars in the bathroom? yes      Handrails on the stairs?   yes      Adequate lighting?   yes  Timed Get Up and Go Performed: n/a  Depression Screen PHQ 2/9 Scores 03/09/2019 03/09/2019 11/22/2018 11/15/2018  PHQ - 2 Score 0 0 0 0    Cognitive Function        Immunization History  Administered Date(s) Administered  . Hepatitis B, adult 03/03/2017  . Hepb-cpg 07/13/2018, 08/17/2018  . Influenza,inj,Quad PF,6+ Mos 07/13/2018    Qualifies for Shingles Vaccine? yes  Screening Tests Health Maintenance  Topic Date Due  . PNEUMOCOCCAL POLYSACCHARIDE VACCINE AGE 9-64 HIGH RISK  01/08/1969  . HIV Screening  01/08/1982  . TETANUS/TDAP  01/08/1986  . HEMOGLOBIN A1C  03/28/2019  . INFLUENZA VACCINE  04/22/2019  . OPHTHALMOLOGY EXAM  07/20/2019  . FOOT EXAM  03/08/2020  . COLONOSCOPY  02/03/2022   Cancer Screenings: Lung: Low Dose CT Chest recommended if Age 37-80 years, 30 pack-year currently smoking OR have quit w/in 15years. Patient does not qualify. Colorectal: up to date  Additional  Screenings:  Hepatitis C Screening:n/a      Plan:   6CIT not completed. Patient is cognitive per direct observation. Wants to lose weight to where blood sugar is under control.  I have personally reviewed and noted the following in the patient's chart:   . Medical and social history . Use of alcohol, tobacco or illicit drugs  . Current medications and supplements . Functional ability and status . Nutritional status . Physical activity . Advanced directives . List of other physicians . Hospitalizations, surgeries, and ER visits in previous 12 months . Vitals . Screenings to include cognitive, depression, and falls . Referrals and appointments  In addition, I have reviewed and discussed with patient certain preventive protocols, quality metrics, and best practice recommendations. A written personalized care plan for preventive services as well as general preventive health recommendations were provided to patient.     Kellie Simmering, LPN  [id number]/83/[id number]

## 2019-03-09 NOTE — Patient Instructions (Addendum)
Great job with loosing weight and improving your diabetes. Your EKG was normal, prostate and rectal exam was also norma. No microscopic blood noted on the rectal test I did today.  Normal BMI is 25, but for health reasons, it is best we get your BMI to less than 30. Yours is 36.09 Please keep on doing your glucose diaries twice a week until I see you back and bring those to me next time in 3 months.  The light headness you feel when you get up after being in the sun, is because you may not been hydrating enough, and the fatigue could be you are sweating so much that your sodium is dropping. 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    Preventive Care 40-64 Years, Male Preventive care refers to lifestyle choices and visits with your health care provider that can promote health and wellness. What does preventive care include?   A yearly physical exam. This is also called an annual well check.  Dental exams once or twice a year.  Routine eye exams. Ask your health care provider how often you should have your eyes checked.  Personal lifestyle choices, including: ? Daily care of your teeth and gums. ? Regular physical activity. ? Eating a healthy diet. ? Avoiding tobacco and drug use. ? Limiting alcohol use. ? Practicing safe sex. ? Taking low-dose aspirin every day starting at age 59. What happens during an annual well check? The services and screenings done by your health care provider during your annual well check will depend on your age, overall health, lifestyle risk factors, and family history of disease. Counseling Your health care provider may ask you questions about your:  Alcohol use.  Tobacco use.  Drug use.  Emotional well-being.  Home and relationship well-being.  Sexual activity.  Eating habits.  Work and work Statistician. Screening You may have the following tests or measurements:  Height,  weight, and BMI.  Blood pressure.  Lipid and cholesterol levels. These may be checked every 5 years, or more frequently if you are over 79 years old.  Skin check.  Lung cancer screening. You may have this screening every year starting at age 33 if you have a 30-pack-year history of smoking and currently smoke or have quit within the past 15 years.  Colorectal cancer screening. All adults should have this screening starting at age 64 and continuing until age 82. Your health care provider may recommend screening at age 95. You will have tests every 1-10 years, depending on your results and the type of screening test. People at increased risk should start screening at an earlier age. Screening tests may include: ? Guaiac-based fecal occult blood testing. ? Fecal immunochemical test (FIT). ? Stool DNA test. ? Virtual colonoscopy. ? Sigmoidoscopy. During this test, a flexible tube with a tiny camera (sigmoidoscope) is used to examine your rectum and lower colon. The sigmoidoscope is inserted through your anus into your rectum and lower colon. ? Colonoscopy. During this test, a long, thin, flexible tube with a tiny camera (colonoscope) is used to examine your entire colon and rectum.  Prostate cancer screening. Recommendations will vary depending on your family history and other risks.  Hepatitis C blood test.  Hepatitis B blood test.  Sexually transmitted disease (STD) testing.  Diabetes screening. This is done by checking your blood sugar (glucose) after you have not eaten for a while (fasting).  You may have this done every 1-3 years. Discuss your test results, treatment options, and if necessary, the need for more tests with your health care provider. Vaccines Your health care provider may recommend certain vaccines, such as:  Influenza vaccine. This is recommended every year.  Tetanus, diphtheria, and acellular pertussis (Tdap, Td) vaccine. You may need a Td booster every 10 years.   Varicella vaccine. You may need this if you have not been vaccinated.  Zoster vaccine. You may need this after age 21.  Measles, mumps, and rubella (MMR) vaccine. You may need at least one dose of MMR if you were born in 1957 or later. You may also need a second dose.  Pneumococcal 13-valent conjugate (PCV13) vaccine. You may need this if you have certain conditions and have not been vaccinated.  Pneumococcal polysaccharide (PPSV23) vaccine. You may need one or two doses if you smoke cigarettes or if you have certain conditions.  Meningococcal vaccine. You may need this if you have certain conditions.  Hepatitis A vaccine. You may need this if you have certain conditions or if you travel or work in places where you may be exposed to hepatitis A.  Hepatitis B vaccine. You may need this if you have certain conditions or if you travel or work in places where you may be exposed to hepatitis B.  Haemophilus influenzae type b (Hib) vaccine. You may need this if you have certain risk factors. Talk to your health care provider about which screenings and vaccines you need and how often you need them. This information is not intended to replace advice given to you by your health care provider. Make sure you discuss any questions you have with your health care provider. Document Released: 10/04/2015 Document Revised: 10/28/2017 Document Reviewed: 07/09/2015 Elsevier Interactive Patient Education  2019 [employer].

## 2019-03-09 NOTE — Addendum Note (Signed)
Addended by: Roxine Caddy on: 03/09/2019 03:11 PM   Modules accepted: Orders

## 2019-03-10 LAB — HIV ANTIBODY (ROUTINE TESTING W REFLEX): HIV Screen 4th Generation wRfx: NONREACTIVE

## 2019-03-11 LAB — ALLERGENS(96) FOODS
Allergen Apple, IgE: <0.1 kU/L
Allergen Banana IgE: <0.1 kU/L
Allergen Barley IgE: <0.1 kU/L
Allergen Black Pepper IgE: <0.1 kU/L
Allergen Blueberry IgE: <0.1 kU/L
Allergen Broccoli: <0.1 kU/L
Allergen Cabbage IgE: <0.1 kU/L
Allergen Carrot IgE: <0.1 kU/L
Allergen Cauliflower IgE: <0.1 kU/L
Allergen Celery IgE: <0.1 kU/L
Allergen Cinnamon IgE: <0.1 kU/L
Allergen Coconut IgE: <0.1 kU/L
Allergen Corn, IgE: <0.1 kU/L
Allergen Cucumber IgE: <0.1 kU/L
Allergen Garlic IgE: <0.1 kU/L
Allergen Ginger IgE: <0.1 kU/L
Allergen Gluten IgE: <0.1 kU/L
Allergen Grape IgE: <0.1 kU/L
Allergen Grapefruit IgE: <0.1 kU/L
Allergen Green Bean IgE: <0.1 kU/L
Allergen Green Bell Pepper IgE: <0.1 kU/L
Allergen Green Pea IgE: <0.1 kU/L
Allergen Lamb IgE: <0.1 kU/L
Allergen Lettuce IgE: <0.1 kU/L
Allergen Lime IgE: <0.1 kU/L
Allergen Melon IgE: <0.1 kU/L
Allergen Oat IgE: <0.1 kU/L
Allergen Onion IgE: <0.1 kU/L
Allergen Pear IgE: <0.1 kU/L
Allergen Potato, White IgE: <0.1 kU/L
Allergen Rice IgE: <0.1 kU/L
Allergen Salmon IgE: <0.1 kU/L
Allergen Strawberry IgE: <0.1 kU/L
Allergen Sweet Potato IgE: <0.1 kU/L
Allergen Tomato, IgE: <0.1 kU/L
Allergen Turkey IgE: <0.1 kU/L
Allergen Watermelon IgE: <0.1 kU/L
Allergen, Peach f95: <0.1 kU/L
Basil: <0.1 kU/L
Beef IgE: <0.1 kU/L
C074-IgE Gelatin: <0.1 kU/L
Chicken IgE: <0.1 kU/L
Chocolate/Cacao IgE: <0.1 kU/L
Clam IgE: <0.1 kU/L
Codfish IgE: <0.1 kU/L
Coffee: <0.1 kU/L
Cranberry IgE: <0.1 kU/L
Egg White IgE: <0.1 kU/L
F020-IgE Almond: <0.1 kU/L
F023-IgE Crab: <0.1 kU/L
F045-IgE Yeast: <0.1 kU/L
F076-IgE Alpha Lactalbumin: <0.1 kU/L
F077-IgE Beta Lactoglobulin: <0.1 kU/L
F078-IgE Casein: <0.1 kU/L
F080-IgE Lobster: <0.1 kU/L
F081-IgE Cheese, Cheddar Type: <0.1 kU/L
F089-IgE Mustard: <0.1 kU/L
F096-IgE Avocado: <0.1 kU/L
F202-IgE Cashew Nut: <0.1 kU/L
F214-IgE Spinach: <0.1 kU/L
F222-IgE Tea: <0.1 kU/L
F242-IgE Bing Cherry: <0.1 kU/L
F247-IgE Honey: <0.1 kU/L
F261-IgE Asparagus: <0.1 kU/L
F262-IgE Eggplant: <0.1 kU/L
F265-IgE Cumin: <0.1 kU/L
F278-IgE Bayleaf (Laurel): <0.1 kU/L
F279-IgE Chili Pepper: <0.1 kU/L
F283-IgE Oregano: <0.1 kU/L
F300-IgE Goat's Milk: <0.1 kU/L
F342-IgE Olive, Black: <0.1 kU/L
F343-IgE Raspberry: <0.1 kU/L
Hops: <0.1 kU/L
IgE Egg (Yolk): <0.1 kU/L
Kidney Bean IgE: <0.1 kU/L
Lemon: <0.1 kU/L
Lima Bean IgE: <0.1 kU/L
Malt: <0.1 kU/L
Mushroom IgE: <0.1 kU/L
Orange: <0.1 kU/L
Peanut IgE: <0.1 kU/L
Pineapple IgE: <0.1 kU/L
Pork IgE: <0.1 kU/L
Pumpkin IgE: <0.1 kU/L
Red Beet: <0.1 kU/L
Rye IgE: <0.1 kU/L
Scallop IgE: <0.1 kU/L
Sesame Seed IgE: <0.1 kU/L
Shrimp IgE: <0.1 kU/L
Soybean IgE: <0.1 kU/L
Tuna: <0.1 kU/L
Vanilla: <0.1 kU/L
Walnut IgE: <0.1 kU/L
Wheat IgE: <0.1 kU/L
Whey: <0.1 kU/L
White Bean IgE: <0.1 kU/L

## 2019-03-11 LAB — CMP14 + ANION GAP
ALT: 34 IU/L (ref 0–44)
AST: 30 IU/L (ref 0–40)
Albumin/Globulin Ratio: 1.9 (ref 1.2–2.2)
Albumin: 4.7 g/dL (ref 3.8–4.9)
Alkaline Phosphatase: 68 IU/L (ref 39–117)
Anion Gap: 15 mmol/L (ref 10.0–18.0)
BUN/Creatinine Ratio: 13 (ref 9–20)
BUN: 14 mg/dL (ref 6–24)
Bilirubin Total: 0.5 mg/dL (ref 0.0–1.2)
CO2: 20 mmol/L (ref 20–29)
Calcium: 10 mg/dL (ref 8.7–10.2)
Chloride: 102 mmol/L (ref 96–106)
Creatinine, Ser: 1.09 mg/dL (ref 0.76–1.27)
GFR calc Af Amer: 90 mL/min/{1.73_m2} (ref >59)
GFR calc non Af Amer: 78 mL/min/{1.73_m2} (ref >59)
Globulin, Total: 2.5 g/dL (ref 1.5–4.5)
Glucose: 124 mg/dL — ABNORMAL HIGH (ref 65–99)
Potassium: 4.2 mmol/L (ref 3.5–5.2)
Sodium: 137 mmol/L (ref 134–144)
Total Protein: 7.2 g/dL (ref 6.0–8.5)

## 2019-03-11 LAB — LIPID PANEL
Chol/HDL Ratio: 4.9 ratio (ref 0.0–5.0)
Cholesterol, Total: 131 mg/dL (ref 100–199)
HDL: 27 mg/dL — ABNORMAL LOW (ref >39)
LDL Calculated: 39 mg/dL (ref 0–99)
Triglycerides: 324 mg/dL — ABNORMAL HIGH (ref 0–149)
VLDL Cholesterol Cal: 65 mg/dL — ABNORMAL HIGH (ref 5–40)

## 2019-03-11 LAB — PSA: Prostate Specific Ag, Serum: 0.5 ng/mL (ref 0.0–4.0)

## 2019-03-11 LAB — HEMOGLOBIN A1C
Est. average glucose Bld gHb Est-mCnc: 146 mg/dL
Hgb A1c MFr Bld: 6.7 % — ABNORMAL HIGH (ref 4.8–5.6)

## 2019-03-11 LAB — T3, FREE: T3, Free: 2.8 pg/mL (ref 2.0–4.4)

## 2019-03-11 LAB — T4, FREE: Free T4: 1.26 ng/dL (ref 0.82–1.77)

## 2019-03-11 LAB — TSH: TSH: 1.81 u[IU]/mL (ref 0.450–4.500)

## 2019-03-20 ENCOUNTER — Encounter: Payer: Self-pay | Admitting: Internal Medicine

## 2019-03-20 MED ORDER — VASCEPA 1 G PO CAPS: ORAL_CAPSULE | ORAL | 0 refills | Status: DC

## 2019-04-06 ENCOUNTER — Encounter: Payer: Self-pay | Admitting: Internal Medicine

## 2019-04-06 ENCOUNTER — Other Ambulatory Visit: Payer: Self-pay

## 2019-04-06 DIAGNOSIS — E1165 Type 2 diabetes mellitus with hyperglycemia: Secondary | ICD-10-CM

## 2019-04-06 MED ORDER — METFORMIN HCL ER 500 MG PO TB24: ORAL_TABLET | ORAL | 0 refills | Status: DC

## 2019-04-18 ENCOUNTER — Encounter: Payer: Self-pay | Admitting: Internal Medicine

## 2019-04-20 ENCOUNTER — Encounter: Payer: Self-pay | Admitting: Internal Medicine

## 2019-04-20 ENCOUNTER — Other Ambulatory Visit: Payer: Self-pay

## 2019-04-20 ENCOUNTER — Ambulatory Visit (INDEPENDENT_AMBULATORY_CARE_PROVIDER_SITE_OTHER): Payer: Medicare PPO | Admitting: Internal Medicine

## 2019-04-20 VITALS — BP 112/64 | HR 84 | Temp 98.2°F | Ht 67.8 in | Wt 228.8 lb

## 2019-04-20 DIAGNOSIS — G5701 Lesion of sciatic nerve, right lower limb: Secondary | ICD-10-CM | POA: Diagnosis not present

## 2019-04-20 NOTE — Progress Notes (Signed)
Subjective:     Patient ID: Stephen Chase , male    DOB: [birth year] , 53 y.o.   MRN: [id number]   Chief Complaint  Patient presents with  . Tailbone Pain    HPI  Has been riding a rider mower last week and there was a lot of bumps and when got off was hurting a lot on his tail bone and radiates to R hip laterally. He describes the pain as sharp when he walks and provoked by lifting his leg to walk. Also provoked with provoked sitting and has to change positions. At night time when he lays down he cant stay on same position and has to change. Alleviated  With heat, has not tried tylenol but BC helped. Has had this type pain as teen before and has gone to PT which helped.   Past Medical History:  Diagnosis Date  . Arthritis    SHOULDERS  . Balanitis   . Deafness, sensorineural, childhood onset    BILATERAL SINCE AGE 58  SECONDARY TO UNKNOWN ILLNESS  . GERD (gastroesophageal reflux disease)   . Hyperlipidemia   . Hypertension   . OSA on CPAP    SEVERE PER STUDY 03-09-2009  . Sleep apnea    uses a cpap  . Smokers' cough ([place])   . Type 2 diabetes mellitus (H[facility])      Family History  Problem Relation Age of Onset  . Cancer Mother        type unknown pt was 78 years old at that time  . Liver disease Father   . Healthy Brother   . Cancer Other         alot of family members have cancer but doesnt know the type     Current Outpatient Medications:  .  ACCU-CHEK GUIDE test strip, CHECK BLOOD SUGAR 3 TIMES DAILY BEFORE BREAKFAST, LUNCH AND DINNER, Disp: 150 each, Rfl: 1 .  aspirin EC 81 MG tablet, Take 81 mg by mouth daily., Disp: , Rfl:  .  atorvastatin (LIPITOR) 40 MG tablet, TAKE 1 TABLET BY MOUTH AT BEDTIME, Disp: 90 tablet, Rfl: 0 .  carvedilol (COREG) 6.25 MG tablet, TAKE 1 TABLET BY MOUTH TWICE DAILY WITH FOOD, Disp: 180 tablet, Rfl: 1 .  fenofibrate (TRICOR) 145 MG tablet, TAKE 1 TABLET BY MOUTH DAILY, Disp: 90 tablet, Rfl: 1 .  glipiZIDE (GLUCOTROL) 10 MG tablet, Take 1  tablet (10 mg total) by mouth 2 (two) times daily before a meal. (Patient not taking: Reported on 03/09/2019), Disp: 60 tablet, Rfl: 0 .  Icosapent Ethyl (VASCEPA) 1 g CAPS, One bid for elevated triglycerides, Disp: 180 capsule, Rfl: 0 .  Insulin Pen Needle (PEN NEEDLES) 32G X 4 MM MISC, Inject 1 each into the skin daily., Disp: 30 each, Rfl: 2 .  lisinopril (ZESTRIL) 20 MG tablet, TAKE 1 TABLET BY MOUTH EVERY DAY, Disp: 90 tablet, Rfl: 1 .  metFORMIN (GLUCOPHAGE XR) 500 MG 24 hr tablet, 2 BID WITH BREAKFAST AND DINNER, Disp: 120 tablet, Rfl: 0 .  modafinil (PROVIGIL) 200 MG tablet, Take 1 tablet (200 mg total) by mouth daily., Disp: 30 tablet, Rfl: 5 .  Multiple Vitamins-Minerals (MULTIVITAMIN WITH MINERALS) tablet, Take 1 tablet by mouth daily., Disp: , Rfl:  .  Niacin, Antihyperlipidemic, (NIACIN ER, ANTIHYPERLIPIDEMIC, PO), Take 2,000 mg by mouth at bedtime. , Disp: , Rfl:  .  omeprazole (PRILOSEC) 10 MG capsule, TAKE 1 CAPSULE BY MOUTH EVERY DAY BEFORE A MEAL, Disp: 90 capsule, Rfl: 1 .  Semaglutide,0.25 or 0.5MG /DOS, (OZEMPIC, 0.25 OR 0.5 MG/DOSE,) 2 MG/1.5ML SOPN, Inject 0.5 mg into the skin once a week., Disp: 3 pen, Rfl: 1 .  tamsulosin (FLOMAX) 0.4 MG CAPS capsule, TAKE 1 CAPSULE BY MOUTH ONCE DAILY 1/2 HOUR FOLLOWING THE SAME MEAL EACH DAY, Disp: 90 capsule, Rfl: 1   No Known Allergies   Review of Systems  Denies leg weakness or paresthesia down his R leg. No swelling or drainage from this area noted. No bowel problems.  Today's Vitals   04/20/19 0839  BP: 112/64  Pulse: 84  Temp: 98.2 F (36.8 C)  TempSrc: Other (Comment)  SpO2: 96%  Weight: 228 lb 12.8 oz (103.8 kg)  Height: 5' 7.8" (1.722 m)  PainSc: 4    Body mass index is 34.99 kg/m.   Objective:  Physical Exam Vitals signs reviewed.  Constitutional:      General: He is not in acute distress.    Appearance: Normal appearance. He is not toxic-appearing.  HENT:     Head: Normocephalic.     Right Ear: External ear  normal.     Left Ear: External ear normal.     Nose: Nose normal.  Eyes:     General: No scleral icterus.    Conjunctiva/sclera: Conjunctivae normal.  Neck:     Musculoskeletal: Neck supple.  Pulmonary:     Effort: Pulmonary effort is normal.  Musculoskeletal:        General: No swelling, tenderness or signs of injury.     Right lower leg: No edema.     Left lower leg: No edema.     Comments: BACK- inspection is normal. Has local tenderness on the R of coccyx which this region feels thicker than the L, and his R piriformis muscle is also tender. ROM of spine to L lateral flexion provoked his pain on R buttocks/coccyx region as well as anterior and posterior flexion. Neg SLR. His tail bone is only slightly tender.   Skin:    General: Skin is warm and dry.     Findings: No bruising, erythema, lesion or rash.  Neurological:     General: No focal deficit present.     Mental Status: He is alert and oriented to person, place, and time.     Gait: Gait normal.     Comments: +1/4 lower extremity reflexes.   Psychiatric:        Mood and Affect: Mood normal.        Behavior: Behavior normal.     Assessment And Plan:  1. Piriformis syndrome of right side-  - Ambulatory referral to Physical Therapy ordered.  Advised to get a doughnut seat to get on the mower, may continue heat and BC,  Pt told If the area of pain gets worse, swollen or red, it could be you are getting an abscess( boil) and you need to be seen. I dont see this today. It is OK to continue [facility] for pain and heat. But if you re-injure it, always start with ice first for the 1st 48h, 15 min at a time. After that change to heat.   FU prn.   Marianela Mandrell RODRIGUEZ-SOUTHWORTH, PA-C    THE PATIENT IS ENCOURAGED TO PRACTICE SOCIAL DISTANCING DUE TO THE COVID-19 PANDEMIC.

## 2019-04-20 NOTE — Patient Instructions (Addendum)
If the area of pain gets worse, swollen or red, it could be you are getting an abscess( boil) and you need to be seen. I dont see this today. It is OK to continue [facility] for pain and heat. But if you re-injure it, always start with ice first for the 1st 48h, 15 min at a time. After that change to heat.   Piriformis Syndrome Rehab Ask your health care provider which exercises are safe for you. Do exercises exactly as told by your health care provider and adjust them as directed. It is normal to feel mild stretching, pulling, tightness, or discomfort as you do these exercises. Stop right away if you feel sudden pain or your pain gets worse. Do not begin these exercises until told by your health care provider. Stretching and range-of-motion exercises These exercises warm up your muscles and joints and improve the movement and flexibility of your hip and pelvis. The exercises also help to relieve pain, numbness, and tingling. Hip rotation This is an exercise in which you lie on your back and stretch the muscles that rotate your hip (hip rotators) to stretch your buttocks. 1. Lie on your back on a firm surface. 2. Pull your left / right knee toward your same shoulder with your left / right hand until your knee is pointing toward the ceiling. Hold your left / right ankle with your other hand. 3. Keeping your knee steady, gently pull your left / right ankle toward your other shoulder until you feel a stretch in your buttocks. 4. Hold this position for __________ seconds. Repeat __________ times. Complete this exercise __________ times a day. Hip extensor This is an exercise in which you lie on your back and pull your knee to your chest. 1. Lie on your back on a firm surface. Both of your legs should be straight. 2. Pull your left / right knee to your chest. Hold your leg in this position by holding onto the back of your thigh or the front of your knee. 3. Hold this position for __________ seconds. 4. Slowly  return to the starting position. Repeat __________ times. Complete this exercise __________ times a day. Strengthening exercises These exercises build strength and endurance in your hip and thigh muscles. Endurance is the ability to use your muscles for a long time, even after they get tired. Straight leg raises, side-lying This exercise strengthens the muscles that rotate the leg at the hip and move it away from your body (hip abductors). 1. Lie on your side with your left / right leg in the top position. Lie so your head, shoulder, knee, and hip line up. Bend your bottom knee to help you balance. 2. Lift your top leg 4-6 inches (10-15 cm) while keeping your toes pointed straight ahead. 3. Hold this position for __________ seconds. 4. Slowly lower your leg to the starting position. 5. Let your muscles relax completely after each repetition. Repeat __________ times. Complete this exercise __________ times a day. Hip abduction and rotation This is sometimes called quadruped (on hands and knees) exercises. 1. Get on your hands and knees on a firm, lightly padded surface. Your hands should be directly below your shoulders, and your knees should be directly below your hips. 2. Lift your left / right knee out to the side. Keep your knee bent. Do not twist your body. 3. Hold this position for __________ seconds. 4. Slowly lower your leg. Repeat __________ times. Complete this exercise __________ times a day. Straight leg  raises, face-down This exercise stretches the muscles that move your hips away from the front of the pelvis (hip extensors). 1. Lie on your abdomen on a bed or a firm surface with a pillow under your hips. 2. Squeeze your buttocks muscles and lift your left / right leg about 4-6 inches (10-15 cm) off the bed. Do not let your back arch. 3. Hold this position for __________ seconds. 4. Slowly lower your leg to the starting position. 5. Let your muscles relax completely after each  repetition. Repeat __________ times. Complete this exercise __________ times a day. This information is not intended to replace advice given to you by your health care provider. Make sure you discuss any questions you have with your health care provider. Document Released: 09/07/2005 Document Revised: 12/29/2018 Document Reviewed: 06/30/2018 Elsevier Patient Education  2020 [place]. Piriformis Syndrome  Piriformis syndrome is a condition that can cause pain and numbness in your buttocks and down the back of your leg. Piriformis syndrome happens when the small muscle that connects the base of your spine to your hip (piriformis muscle) presses on the nerve that runs down the back of your leg (sciatic nerve). The piriformis muscle helps your hip rotate and helps to bring your leg back and out. It also helps shift your weight to keep you stable while you are walking. The sciatic nerve runs under or through the piriformis muscle. Damage to the piriformis muscle can cause spasms that put pressure on the nerve below. This causes pain and discomfort while sitting and moving. The pain may feel as if it begins in the buttock and spreads (radiates) down your hip and thigh. What are the causes? This condition is caused by pressure on the sciatic nerve from the piriformis muscle. The piriformis muscle can get irritated with overuse, especially if other hip muscles are weak and the piriformis muscle has to do extra work. Piriformis syndrome can also occur after an injury, like a fall onto your buttocks. What increases the risk? You are more likely to develop this condition if you:  Are a woman.  Sit for long periods of time.  Are a cyclist.  Have weak buttocks muscles (gluteal muscles). What are the signs or symptoms? Symptoms of this condition include:  Pain, tingling, or numbness that starts in the buttock and runs down the back of your leg (sciatica).  Pain in the groin or thigh area. Your  symptoms may get worse:  The longer you sit.  When you walk, run, or climb stairs.  When straining to have a bowel movement. How is this diagnosed? This condition is diagnosed based on your symptoms, medical history, and physical exam.  During the exam, your health care provider may: ? Move your leg into different positions to check for pain. ? Press on the muscles of your hip and buttock to see if that increases your symptoms.  You may also have tests, including: ? Imaging tests such as X-rays, MRI, or ultrasound. ? Electromyogram (EMG). This test measures electrical signals sent by your nerves into the muscles. ? Nerve conduction study. This test measures how well electrical signals pass through your nerves. How is this treated? This condition may be treated by:  Stopping all activities that cause pain or make your condition worse.  Applying ice or using heat therapy.  Taking medicines to reduce pain and swelling.  Taking a muscle relaxer (muscle relaxant) to stop muscle spasms.  Doing range-of-motion and strengthening exercises (physical therapy) as told  by your health care provider.  Massaging the area.  Having acupuncture.  Getting an injection of medicine in the piriformis muscle. Your health care provider will choose the medicine based on your condition. He or she may inject: ? An anti-inflammatory medicine (steroid) to reduce swelling. ? A numbing medicine (local anesthetic) to block the pain. ? Botulinum toxin. The toxin blocks nerve impulses to specific muscles to reduce muscle tension. In rare cases, you may need surgery to cut the muscle and release pressure on the nerve if other treatments do not work. Follow these instructions at home: Activity  Do not sit for long periods. Get up and walk around every 20 minutes or as often as told by your health care provider. ? When driving long distances, make sure to take frequent stops to get up and stretch.  Use a  cushion when you sit on hard surfaces.  Do exercises as told by your health care provider.  Return to your normal activities as told by your health care provider. Ask your health care provider what activities are safe for you. Managing pain, stiffness, and swelling      If directed, apply heat to the affected area as often as told by your health care provider. Use the heat source that your health care provider recommends, such as a moist heat pack or a heating pad. ? Place a towel between your skin and the heat source. ? Leave the heat on for 20-30 minutes. ? Remove the heat if your skin turns bright red. This is especially important if you are unable to feel pain, heat, or cold. You may have a greater risk of getting burned.  If directed, put ice on the injured area. ? Put ice in a plastic bag. ? Place a towel between your skin and the bag. ? Leave the ice on for 20 minutes, 2-3 times a day. General instructions  Take over-the-counter and prescription medicines only as told by your health care provider.  Ask your health care provider if the medicine prescribed to you requires you to avoid driving or using heavy machinery.  You may need to take actions to prevent or treat constipation, such as: ? Drink enough fluid to keep your urine pale yellow. ? Take over-the-counter or prescription medicines. ? Eat foods that are high in fiber, such as beans, whole grains, and fresh fruits and vegetables. ? Limit foods that are high in fat and processed sugars, such as fried or sweet foods.  Keep all follow-up visits as told by your health care provider. This is important. How is this prevented?  Do not sit for longer than 20 minutes at a time. When you sit, choose padded surfaces.  Warm up and stretch before being active.  Cool down and stretch after being active.  Give your body time to rest between periods of activity.  Make sure to use equipment that fits you.  Maintain physical  fitness, including: ? Strength. ? Flexibility. Contact a health care provider if:  Your pain and stiffness continue or get worse.  Your leg or hip becomes weak.  You have changes in your bowel function or bladder function. Summary  Piriformis syndrome is a condition that can cause pain, tingling, and numbness in your buttocks and down the back of your leg.  You may try applying heat or ice to relieve the pain.  Do not sit for long periods. Get up and walk around every 20 minutes or as often as told by your  health care provider. This information is not intended to replace advice given to you by your health care provider. Make sure you discuss any questions you have with your health care provider. Document Released: 09/07/2005 Document Revised: 12/29/2018 Document Reviewed: 05/04/2018 Elsevier Patient Education  2020 [employer].

## 2019-04-23 ENCOUNTER — Encounter: Payer: Self-pay | Admitting: Neurology

## 2019-04-23 ENCOUNTER — Encounter: Payer: Self-pay | Admitting: Adult Health

## 2019-04-24 ENCOUNTER — Telehealth: Payer: Self-pay | Admitting: Neurology

## 2019-04-24 ENCOUNTER — Other Ambulatory Visit: Payer: Self-pay | Admitting: Neurology

## 2019-04-24 MED ORDER — MODAFINIL 200 MG PO TABS: 200.0000 mg | ORAL_TABLET | Freq: Every day | ORAL | 5 refills | Status: DC

## 2019-04-24 NOTE — Telephone Encounter (Signed)
Pt has called asking for refill on modafinil (PROVIGIL) 200 MG tablet([phone]

## 2019-04-24 NOTE — Telephone Encounter (Signed)
Script has been sent to Dr Felecia Shelling for review to send in for the patient.

## 2019-05-12 ENCOUNTER — Encounter: Payer: Self-pay | Admitting: Gastroenterology

## 2019-05-12 ENCOUNTER — Ambulatory Visit (INDEPENDENT_AMBULATORY_CARE_PROVIDER_SITE_OTHER): Payer: Medicare PPO | Admitting: Gastroenterology

## 2019-05-12 ENCOUNTER — Other Ambulatory Visit (INDEPENDENT_AMBULATORY_CARE_PROVIDER_SITE_OTHER): Payer: Medicare PPO

## 2019-05-12 VITALS — BP 110/70 | HR 66 | Temp 98.7°F | Ht 67.0 in | Wt 233.0 lb

## 2019-05-12 DIAGNOSIS — Z23 Encounter for immunization: Secondary | ICD-10-CM | POA: Diagnosis not present

## 2019-05-12 DIAGNOSIS — I8510 Secondary esophageal varices without bleeding: Secondary | ICD-10-CM

## 2019-05-12 DIAGNOSIS — K760 Fatty (change of) liver, not elsewhere classified: Secondary | ICD-10-CM

## 2019-05-12 DIAGNOSIS — R1310 Dysphagia, unspecified: Secondary | ICD-10-CM

## 2019-05-12 DIAGNOSIS — I8500 Esophageal varices without bleeding: Secondary | ICD-10-CM

## 2019-05-12 DIAGNOSIS — K7460 Unspecified cirrhosis of liver: Secondary | ICD-10-CM | POA: Diagnosis not present

## 2019-05-12 LAB — CBC WITH DIFFERENTIAL/PLATELET
Basophils Absolute: 0 10*3/uL (ref 0.0–0.1)
Basophils Relative: 0.7 % (ref 0.0–3.0)
Eosinophils Absolute: 0.2 10*3/uL (ref 0.0–0.7)
Eosinophils Relative: 2.8 % (ref 0.0–5.0)
HCT: 41.4 % (ref 39.0–52.0)
Hemoglobin: 14.2 g/dL (ref 13.0–17.0)
Lymphocytes Relative: 33.7 % (ref 12.0–46.0)
Lymphs Abs: 1.9 10*3/uL (ref 0.7–4.0)
MCHC: 34.2 g/dL (ref 30.0–36.0)
MCV: 90.6 fl (ref 78.0–100.0)
Monocytes Absolute: 0.3 10*3/uL (ref 0.1–1.0)
Monocytes Relative: 5.4 % (ref 3.0–12.0)
Neutro Abs: 3.3 10*3/uL (ref 1.4–7.7)
Neutrophils Relative %: 57.4 % (ref 43.0–77.0)
Platelets: 148 10*3/uL — ABNORMAL LOW (ref 150.0–400.0)
RBC: 4.57 Mil/uL (ref 4.22–5.81)
RDW: 13.2 % (ref 11.5–15.5)
WBC: 5.8 10*3/uL (ref 4.0–10.5)

## 2019-05-12 LAB — PROTIME-INR
INR: 1.2 ratio — ABNORMAL HIGH (ref 0.8–1.0)
Prothrombin Time: 14.1 s — ABNORMAL HIGH (ref 9.6–13.1)

## 2019-05-12 NOTE — Progress Notes (Signed)
HPI :  53 year old male here for a follow-up visit. Of note, the patient is deaf and speaks with sign Ecologist.  He has a history of cirrhosis of the liver, which is thought to be related to fatty liver disease. He has a history of small esophageal varices, and he is taking Coreg for this and HTN. He has not had any other decompensations we are aware of. He's had a negative prior serologic workup. He endorses drinking alcohol heavily much earlier in his life (62s), but has not been using any alcohol in recent years.  I haven't seen him since October.  He has been doing pretty well since that time.  He has no issues with the cirrhosis, no jaundice, no edema.  He is feeling pretty well.  He thinks he has been doing okay with his weight, he is lost about 7 pounds since of last seen him.  Over the past 2 years he has lost upwards of 20 pounds.  His main complaint is dysphasia.  He feels dysphasia to solid foods and pills when swallowing, feels that in the throat area.  Once he gets it down that area he does not have any issues.  Sometimes he tries to push things down with water.  He denies any problems with drinking fluids at all.  He is actually had this ongoing for the past 4 to 5 years.  He has had a prior barium swallow in 2017 which was normal.  He had an EGD with me in 2018 which did not show any obvious stenosis.  He did have very small esophageal varices noted.  He did not receive empiric dilation at the last endoscopy.  He feels he needs to chew his food extremely well in order to get it down.  Prior workup: Barium swallow 07/30/2016 - normal exam EGD 02/03/2017 - small esophageal varices, normal esophagus otherwise without stenosis, gastric polyps removed (hyperplastic) and gastritis (HP negative) Colonoscopy 02/03/2017 - diverticulosis, 2 small adenomas, hemorrhoids  CT scan 03/10/17 - diffuse hepatic steatosis, cirrhotic changes, no mass lesions, splenomegaly, mild ascites, stable  reactive benign porta hepatis adenopathy  US abdomen 06/21/18 - fatty liver / cirrhosis, no mass lesion, no ascites   Past Medical History:  Diagnosis Date  . Arthritis    SHOULDERS  . Balanitis   . Deafness, sensorineural, childhood onset    BILATERAL SINCE AGE 50  SECONDARY TO UNKNOWN ILLNESS  . GERD (gastroesophageal reflux disease)   . Hyperlipidemia   . Hypertension   . OSA on CPAP    SEVERE PER STUDY 03-09-2009  . Sleep apnea    uses a cpap  . Smokers' cough ([place])   . Type 2 diabetes mellitus ([place])      Past Surgical History:  Procedure Laterality Date  . CIRCUMCISION N/A 12/19/2013   Procedure: CIRCUMCISION ADULT;  Surgeon: Lowella Bandy, MD;  [facility];  Service: Urology;  Laterality: N/A;  . WRIST SURGERY Left    Family History  Problem Relation Age of Onset  . Cancer Mother        type unknown pt was 2 years old at that time  . Liver disease Father   . Healthy Brother   . Cancer Other         alot of family members have cancer but doesnt know the type   Social History   Tobacco Use  . Smoking status: Former Smoker    Packs/day: 1.00    Years: 30.00  Pack years: 30.00    Types: Cigarettes  . Smokeless tobacco: Never Used  . Tobacco comment: quit 4 years ago  Substance Use Topics  . Alcohol use: Yes    Comment: rarely  . Drug use: No   Current Outpatient Medications  Medication Sig Dispense Refill  . ACCU-CHEK GUIDE test strip CHECK BLOOD SUGAR 3 TIMES DAILY BEFORE BREAKFAST, LUNCH AND DINNER 150 each 1  . aspirin EC 81 MG tablet Take 81 mg by mouth daily.    Marland Kitchen atorvastatin (LIPITOR) 40 MG tablet TAKE 1 TABLET BY MOUTH AT BEDTIME 90 tablet 0  . carvedilol (COREG) 6.25 MG tablet TAKE 1 TABLET BY MOUTH TWICE DAILY WITH FOOD 180 tablet 1  . fenofibrate (TRICOR) 145 MG tablet TAKE 1 TABLET BY MOUTH DAILY 90 tablet 1  . glipiZIDE (GLUCOTROL) 10 MG tablet Take 1 tablet (10 mg total) by mouth 2 (two) times daily before a meal. 60  tablet 0  . Icosapent Ethyl (VASCEPA) 1 g CAPS One bid for elevated triglycerides 180 capsule 0  . Insulin Pen Needle (PEN NEEDLES) 32G X 4 MM MISC Inject 1 each into the skin daily. 30 each 2  . lisinopril (ZESTRIL) 20 MG tablet TAKE 1 TABLET BY MOUTH EVERY DAY 90 tablet 1  . metFORMIN (GLUCOPHAGE XR) 500 MG 24 hr tablet 2 BID WITH BREAKFAST AND DINNER 120 tablet 0  . modafinil (PROVIGIL) 200 MG tablet Take 1 tablet (200 mg total) by mouth daily. 30 tablet 5  . Multiple Vitamins-Minerals (MULTIVITAMIN WITH MINERALS) tablet Take 1 tablet by mouth daily.    . Niacin, Antihyperlipidemic, (NIACIN ER, ANTIHYPERLIPIDEMIC, PO) Take 2,000 mg by mouth at bedtime.     Marland Kitchen omeprazole (PRILOSEC) 10 MG capsule TAKE 1 CAPSULE BY MOUTH EVERY DAY BEFORE A MEAL 90 capsule 1  . Semaglutide,0.25 or 0.5MG /DOS, (OZEMPIC, 0.25 OR 0.5 MG/DOSE,) 2 MG/1.5ML SOPN Inject 0.5 mg into the skin once a week. 3 pen 1  . tamsulosin (FLOMAX) 0.4 MG CAPS capsule TAKE 1 CAPSULE BY MOUTH ONCE DAILY 1/2 HOUR FOLLOWING THE SAME MEAL EACH DAY 90 capsule 1   No current facility-administered medications for this visit.    No Known Allergies   Review of Systems: All systems reviewed and negative except where noted in HPI.   Lab Results  Component Value Date   WBC 5.8 05/12/2019   HGB 14.2 05/12/2019   HCT 41.4 05/12/2019   MCV 90.6 05/12/2019   PLT 148.0 (L) 05/12/2019    Lab Results  Component Value Date   CREATININE 1.09 03/09/2019   BUN 14 03/09/2019   NA 137 03/09/2019   K 4.2 03/09/2019   CL 102 03/09/2019   CO2 20 03/09/2019    Lab Results  Component Value Date   ALT 34 03/09/2019   AST 30 03/09/2019   ALKPHOS 68 03/09/2019   BILITOT 0.5 03/09/2019   Lab Results  Component Value Date   INR 1.2 (H) 05/12/2019   INR 1.1 (H) 07/13/2018   INR 1.3 (H) 02/03/2017     Physical Exam: BP 110/70 (BP Location: Left Arm, Patient Position: Sitting)   Pulse 66   Temp 98.7 F (37.1 C)   Ht 5\' 7"  (1.702 m)    Wt 233 lb (105.7 kg)   SpO2 98%   BMI 36.49 kg/m  Constitutional: Pleasant,well-developed, male in no acute distress. HEENT: Normocephalic and atraumatic. Conjunctivae are normal. No scleral icterus. Neck supple.  Cardiovascular: Normal rate, regular rhythm.  Pulmonary/chest: Effort normal  and breath sounds normal. No wheezing, rales or rhonchi. Abdominal: Soft, nondistended, nontender.  There are no masses palpable. Extremities: no edema Lymphadenopathy: No cervical adenopathy noted. Neurological: Alert and oriented to person place and time. Skin: Skin is warm and dry. No rashes noted. Psychiatric: Normal mood and affect. Behavior is normal.   ASSESSMENT AND PLAN: 53 year old male here for reassessment of the following:  Cirrhosis / Esophageal varices - patient has small esophageal varices, on Coreg, he has not had any other evidence of decompensation.  He has been doing a good job at weight loss, over the past year or so, he should continue to work on this to try to normalize his body mass index.  He is due for CBC, INR, AFP, and screening right upper quadrant ultrasound.  He will continue to avoid alcohol. He was given hep B vaccine today, he did not complete the series as previously recommended.  He should have a flu shot this fall.  We will continue to see him every 6 months for this issue.  Dysphagia -ongoing symptoms of intermittent dysphagia to pills and solid foods.  He tends to feel this mostly in his throat.  Prior EGD and barium swallow did not show a clear cause.  I discussed options with him.  It is possible he may have a mild cricopharyngeal stenosis given location of his symptoms.  He has not had an empiric dilation.  Given its been a few years since his last endoscopy, I offered him an EGD to reevaluate this issue, and perform empiric dilation of the UES.  I discussed risks and benefits and he want to proceed.  Further recommendations pending results.  If this does not help and  symptoms persist, can consider manometry.  He agreed  Nashua Cellar, MD [facility]

## 2019-05-12 NOTE — Patient Instructions (Addendum)
You have been scheduled for a Previsit to go over instructions for endoscopy on Monday, 05/15/19 at 4:30 pm (since you would like to have time to look over consent form and discuss with your family prior to signing). Please come to the 3rd floor of [address] To check in.  You are scheduled for endoscopy on 05/19/19 at 9:30 am on 4th floor of [address]. You will receive prep instructions at your previsit.  Your provider has requested that you go to the basement level for lab work before leaving today. Press "B" on the elevator. The lab is located at the first door on the left as you exit the elevator.  You have received Heplisav (hepatitis B injection) today.  You have been scheduled for an RUQ abdominal ultrasound at [facility] (1st floor of hospital) on Tuesday 05/23/19 at 9:00 am. Please arrive 15 minutes prior to your appointment for registration. Make certain not to have anything to eat or drink 6 hours prior to your appointment. Should you need to reschedule your appointment, please contact radiology at [phone]. This test typically takes about 30 minutes to perform.  If you are age 19 or older, your body mass index should be between 23-30. Your Body mass index is 36.49 kg/m. If this is out of the aforementioned range listed, please consider follow up with your Primary Care Provider.  If you are age 76 or younger, your body mass index should be between 19-25. Your Body mass index is 36.49 kg/m. If this is out of the aformentioned range listed, please consider follow up with your Primary Care Provider.

## 2019-05-15 ENCOUNTER — Encounter: Payer: Self-pay | Admitting: Gastroenterology

## 2019-05-15 ENCOUNTER — Ambulatory Visit (AMBULATORY_SURGERY_CENTER): Payer: Self-pay

## 2019-05-15 ENCOUNTER — Other Ambulatory Visit: Payer: Self-pay

## 2019-05-15 VITALS — Ht 67.0 in | Wt 234.0 lb

## 2019-05-15 DIAGNOSIS — R1310 Dysphagia, unspecified: Secondary | ICD-10-CM

## 2019-05-15 LAB — AFP TUMOR MARKER: AFP-Tumor Marker: 2.9 ng/mL (ref <6.1)

## 2019-05-15 NOTE — Progress Notes (Signed)
Denies allergies to eggs or soy products. Denies complication of anesthesia or sedation. Denies use of weight loss medication. Denies use of O2.   Emmi instructions given for endoscopy.   PV took one hour and 45 minutes. Demani did not want to sign the consent form because he did not want any blood products if anything happened as a result of the procedure. A form was attached to patients chart and I also entered it into the computer. I explained to the patient that the hospital would always honor their religious wishes. PV was conducted with Janetta Hora  Interpreting.

## 2019-05-18 ENCOUNTER — Telehealth: Payer: Self-pay

## 2019-05-18 NOTE — Telephone Encounter (Signed)
Covid-19 screening questions   Do you now or have you had a fever in the last 14 days?  Do you have any respiratory symptoms of shortness of breath or cough now or in the last 14 days?  Do you have any family members or close contacts with diagnosed or suspected Covid-19 in the past 14 days?  Have you been tested for Covid-19 and found to be positive?       

## 2019-05-19 ENCOUNTER — Other Ambulatory Visit: Payer: Self-pay

## 2019-05-19 ENCOUNTER — Encounter: Payer: Self-pay | Admitting: Gastroenterology

## 2019-05-19 ENCOUNTER — Ambulatory Visit (AMBULATORY_SURGERY_CENTER): Payer: Medicare PPO | Admitting: Gastroenterology

## 2019-05-19 VITALS — BP 107/51 | HR 66 | Temp 98.5°F | Resp 21 | Ht 67.0 in | Wt 234.0 lb

## 2019-05-19 DIAGNOSIS — R1310 Dysphagia, unspecified: Secondary | ICD-10-CM | POA: Diagnosis not present

## 2019-05-19 DIAGNOSIS — I8510 Secondary esophageal varices without bleeding: Secondary | ICD-10-CM | POA: Diagnosis not present

## 2019-05-19 MED ORDER — SODIUM CHLORIDE 0.9 % IV SOLN: 500.0000 mL | Freq: Once | INTRAVENOUS | Status: DC

## 2019-05-19 NOTE — Op Note (Signed)
[place] Patient Name: Stephen Chase Procedure Date: 05/19/2019 9:04 AM MRN: [id number] Endoscopist: [name] , MD Age: 53 Referring MD:  Date of Birth: 1966-07-04 Gender: Male Account #: [id number] Procedure:                Upper GI endoscopy Indications:              Dysphagia, history of cirrhosis with varices on                            Coreg - here for EGD with dilation Medicines:                Monitored Anesthesia Care Procedure:                Pre-Anesthesia Assessment:                           - Prior to the procedure, a History and Physical                            was performed, and patient medications and                            allergies were reviewed. The patient's tolerance of                            previous anesthesia was also reviewed. The risks                            and benefits of the procedure and the sedation                            options and risks were discussed with the patient.                            All questions were answered, and informed consent                            was obtained. Prior Anticoagulants: The patient has                            taken no previous anticoagulant or antiplatelet                            agents. ASA Grade Assessment: II - A patient with                            mild systemic disease. After reviewing the risks                            and benefits, the patient was deemed in                            satisfactory condition to undergo the procedure.  After obtaining informed consent, the endoscope was                            passed under direct vision. Throughout the                            procedure, the patient's blood pressure, pulse, and                            oxygen saturations were monitored continuously. The                            Endoscope was introduced through the mouth, and                            advanced to the antrum  of the stomach. The upper GI                            endoscopy was accomplished without difficulty. The                            patient tolerated the procedure well. Scope In: Scope Out: Findings:                 Esophagogastric landmarks were identified: the                            Z-line was found at 38 cm from the incisors.                           Small (< 5 mm) varices were found in the lower                            third of the esophagus. No stigmata of bleeding                            appreciated.                           The exam of the esophagus was otherwise normal. No                            obvious stenosis. We had planned for empiric                            dilation to see if that would improve symptoms                            however due to large amount of food in the stomach,                            the procedure was terminated prematurely as it was  not safe to dilate.                           A large amount of food (residue) was found in the                            gastric body and in the gastric antrum, the stomach                            could not be cleared, the procedure was aborted.. Complications:            No immediate complications. Estimated blood loss:                            None. Estimated Blood Loss:     Estimated blood loss: none. Impression:               - Esophagogastric landmarks identified.                           - Small esophageal varices without stigmata for                            bleeding.                           - No obvious tenosis / stricture - empiric dilation                            was planned but not performed given large amount of                            residual food in the stomach                           - A large amount of food (residue) in the stomach -                            procedure terminated prematurely. Recommendation:           - Patient has a  contact number available for                            emergencies. The signs and symptoms of potential                            delayed complications were discussed with the                            patient. Return to normal activities tomorrow.                            Written discharge instructions were provided to the                            patient.                           -  Resume previous diet.                           - Continue present medications including Coreg                           - Repeat upper endoscopy at another time - would                            recommend clear liquid dinner the night prior to                            the exam (patient reportedly had steak last night)                            to ensure the stomach is clear Remo Lipps P. [name], MD 05/19/2019 9:31:32 AM This report has been signed electronically.

## 2019-05-19 NOTE — Progress Notes (Signed)
PT taken to PACU. Monitors in place. VSS. Report given to RN. 

## 2019-05-19 NOTE — Progress Notes (Signed)
Pt's states no medical or surgical changes since previsit or office visit.  Stephen Chase

## 2019-05-19 NOTE — Progress Notes (Signed)
Instructions printed out and given to pt per Emilio Math RN Ambulatory GI referral put in

## 2019-05-19 NOTE — Patient Instructions (Signed)
You will have a repeat EGD next Friday at 2:00 pm.  Follow all instructions on your preparation for Endoscopy paperwork.  YOU HAD AN ENDOSCOPIC PROCEDURE TODAY AT [facility]:   Refer to the procedure report that was given to you for any specific questions about what was found during the examination.  If the procedure report does not answer your questions, please call your gastroenterologist to clarify.  If you requested that your care partner not be given the details of your procedure findings, then the procedure report has been included in a sealed envelope for you to review at your convenience later.  YOU SHOULD EXPECT: Some feelings of bloating in the abdomen. Passage of more gas than usual.  Walking can help get rid of the air that was put into your GI tract during the procedure and reduce the bloating. If you had a lower endoscopy (such as a colonoscopy or flexible sigmoidoscopy) you may notice spotting of blood in your stool or on the toilet paper. If you underwent a bowel prep for your procedure, you may not have a normal bowel movement for a few days.  Please Note:  You might notice some irritation and congestion in your nose or some drainage.  This is from the oxygen used during your procedure.  There is no need for concern and it should clear up in a day or so.  SYMPTOMS TO REPORT IMMEDIATELY:   Following upper endoscopy (EGD)  Vomiting of blood or coffee ground material  New chest pain or pain under the shoulder blades  Painful or persistently difficult swallowing  New shortness of breath  Fever of 100F or higher  Black, tarry-looking stools  For urgent or emergent issues, a gastroenterologist can be reached at any hour by calling [phone].   DIET:  We do recommend a small meal at first, but then you may proceed to your regular diet.  Drink plenty of fluids but you should avoid alcoholic beverages for 24 hours.  ACTIVITY:  You should plan to take it easy  for the rest of today and you should NOT DRIVE or use heavy machinery until tomorrow (because of the sedation medicines used during the test).    FOLLOW UP: Our staff will call the number listed on your records 48-72 hours following your procedure to check on you and address any questions or concerns that you may have regarding the information given to you following your procedure. If we do not reach you, we will leave a message.  We will attempt to reach you two times.  During this call, we will ask if you have developed any symptoms of COVID 19. If you develop any symptoms (ie: fever, flu-like symptoms, shortness of breath, cough etc.) before then, please call [phone].  If you test positive for Covid 19 in the 2 weeks post procedure, please call and report this information to [place].    If any biopsies were taken you will be contacted by phone or by letter within the next 1-3 weeks.  Please call us at [phone] if you have not heard about the biopsies in 3 weeks.    SIGNATURES/CONFIDENTIALITY: You and/or your care partner have signed paperwork which will be entered into your electronic medical record.  These signatures attest to the fact that that the information above on your After Visit Summary has been reviewed and is understood.  Full responsibility of the confidentiality of this discharge information lies with you and/or your care-partner.

## 2019-05-22 ENCOUNTER — Encounter: Payer: Self-pay | Admitting: Internal Medicine

## 2019-05-22 ENCOUNTER — Other Ambulatory Visit: Payer: Self-pay

## 2019-05-22 MED ORDER — OZEMPIC (0.25 OR 0.5 MG/DOSE) 2 MG/1.5ML ~~LOC~~ SOPN: 0.5000 mg | PEN_INJECTOR | SUBCUTANEOUS | 1 refills | Status: DC

## 2019-05-22 MED ORDER — VASCEPA 1 G PO CAPS: ORAL_CAPSULE | ORAL | 0 refills | Status: DC

## 2019-05-23 ENCOUNTER — Telehealth: Payer: Self-pay

## 2019-05-23 ENCOUNTER — Encounter: Payer: Self-pay | Admitting: Internal Medicine

## 2019-05-23 ENCOUNTER — Ambulatory Visit (HOSPITAL_COMMUNITY)
Admission: RE | Admit: 2019-05-23 | Discharge: 2019-05-23 | Disposition: A | Discharge status: Home / Self Care | Payer: Medicare PPO | Source: Ambulatory Visit | Attending: Gastroenterology | Admitting: Gastroenterology

## 2019-05-23 ENCOUNTER — Other Ambulatory Visit: Payer: Self-pay

## 2019-05-23 DIAGNOSIS — K7460 Unspecified cirrhosis of liver: Secondary | ICD-10-CM | POA: Diagnosis not present

## 2019-05-23 DIAGNOSIS — I8500 Esophageal varices without bleeding: Secondary | ICD-10-CM

## 2019-05-23 DIAGNOSIS — K760 Fatty (change of) liver, not elsewhere classified: Secondary | ICD-10-CM

## 2019-05-23 DIAGNOSIS — R1310 Dysphagia, unspecified: Secondary | ICD-10-CM

## 2019-05-23 NOTE — Telephone Encounter (Signed)
Left message on follow up call. 

## 2019-05-23 NOTE — Telephone Encounter (Signed)
Left message on 2nd follow up call. 

## 2019-05-24 ENCOUNTER — Telehealth: Payer: Self-pay

## 2019-05-24 NOTE — Telephone Encounter (Signed)
Patient's wife called stating patient's ozempic is over $600 and she wants to know if there is something else he can take. I advised her they can come pick up a sample and then we will have Almyra Free to give them a call and see if she can come them with this medication. YRL,RMA

## 2019-05-25 ENCOUNTER — Telehealth: Payer: Self-pay

## 2019-05-25 NOTE — Telephone Encounter (Signed)
Covid-19 screening questions   Do you now or have you had a fever in the last 14 days?  Do you have any respiratory symptoms of shortness of breath or cough now or in the last 14 days?  Do you have any family members or close contacts with diagnosed or suspected Covid-19 in the past 14 days?  Have you been tested for Covid-19 and found to be positive?       

## 2019-05-26 ENCOUNTER — Encounter: Payer: Self-pay | Admitting: Gastroenterology

## 2019-05-26 ENCOUNTER — Ambulatory Visit (AMBULATORY_SURGERY_CENTER): Payer: Medicare PPO | Admitting: Gastroenterology

## 2019-05-26 ENCOUNTER — Other Ambulatory Visit: Payer: Self-pay

## 2019-05-26 VITALS — BP 109/63 | HR 68 | Temp 98.3°F | Resp 22 | Ht 67.0 in | Wt 233.0 lb

## 2019-05-26 DIAGNOSIS — K3189 Other diseases of stomach and duodenum: Secondary | ICD-10-CM | POA: Diagnosis not present

## 2019-05-26 DIAGNOSIS — R1310 Dysphagia, unspecified: Secondary | ICD-10-CM | POA: Diagnosis not present

## 2019-05-26 DIAGNOSIS — K2950 Unspecified chronic gastritis without bleeding: Secondary | ICD-10-CM | POA: Diagnosis not present

## 2019-05-26 DIAGNOSIS — K317 Polyp of stomach and duodenum: Secondary | ICD-10-CM

## 2019-05-26 MED ORDER — SODIUM CHLORIDE 0.9 % IV SOLN: 500.0000 mL | Freq: Once | INTRAVENOUS | Status: DC

## 2019-05-26 NOTE — Progress Notes (Signed)
Intrepreter Susie at bedside

## 2019-05-26 NOTE — Progress Notes (Signed)
A/ox3, pleased with MAC, report to RN 

## 2019-05-26 NOTE — Progress Notes (Signed)
Called to room to assist during endoscopic procedure.  Patient ID and intended procedure confirmed with present staff. Received instructions for my participation in the procedure from the performing physician.  

## 2019-05-26 NOTE — Patient Instructions (Signed)
HANDOUTS GIVEN : ESOPHAGEAL DILATATION   YOU HAD AN ENDOSCOPIC PROCEDURE TODAY AT [facility]:   Refer to the procedure report that was given to you for any specific questions about what was found during the examination.  If the procedure report does not answer your questions, please call your gastroenterologist to clarify.  If you requested that your care partner not be given the details of your procedure findings, then the procedure report has been included in a sealed envelope for you to review at your convenience later.  YOU SHOULD EXPECT: Some feelings of bloating in the abdomen. Passage of more gas than usual.  Walking can help get rid of the air that was put into your GI tract during the procedure and reduce the bloating. If you had a lower endoscopy (such as a colonoscopy or flexible sigmoidoscopy) you may notice spotting of blood in your stool or on the toilet paper. If you underwent a bowel prep for your procedure, you may not have a normal bowel movement for a few days.  Please Note:  You might notice some irritation and congestion in your nose or some drainage.  This is from the oxygen used during your procedure.  There is no need for concern and it should clear up in a day or so.  SYMPTOMS TO REPORT IMMEDIATELY:    Following upper endoscopy (EGD)  Vomiting of blood or coffee ground material  New chest pain or pain under the shoulder blades  Painful or persistently difficult swallowing  New shortness of breath  Fever of 100F or higher  Black, tarry-looking stools  For urgent or emergent issues, a gastroenterologist can be reached at any hour by calling [phone].   DIET:  We do recommend a small meal at first, but then you may proceed to your regular diet.  Drink plenty of fluids but you should avoid alcoholic beverages for 24 hours.  ACTIVITY:  You should plan to take it easy for the rest of today and you should NOT DRIVE or use heavy machinery until  tomorrow (because of the sedation medicines used during the test).    FOLLOW UP: Our staff will call the number listed on your records 48-72 hours following your procedure to check on you and address any questions or concerns that you may have regarding the information given to you following your procedure. If we do not reach you, we will leave a message.  We will attempt to reach you two times.  During this call, we will ask if you have developed any symptoms of COVID 19. If you develop any symptoms (ie: fever, flu-like symptoms, shortness of breath, cough etc.) before then, please call [phone].  If you test positive for Covid 19 in the 2 weeks post procedure, please call and report this information to [place].    If any biopsies were taken you will be contacted by phone or by letter within the next 1-3 weeks.  Please call us at [phone] if you have not heard about the biopsies in 3 weeks.    SIGNATURES/CONFIDENTIALITY: You and/or your care partner have signed paperwork which will be entered into your electronic medical record.  These signatures attest to the fact that that the information above on your After Visit Summary has been reviewed and is understood.  Full responsibility of the confidentiality of this discharge information lies with you and/or your care-partner.

## 2019-05-26 NOTE — Op Note (Signed)
[facility] Patient Name: Stephen Chase Procedure Date: 05/26/2019 1:47 PM MRN: [id number] Endoscopist: [name] , MD Age: 53 Referring MD:  Date of Birth: 18-Apr-1966 Gender: Male Account #: [id number] Procedure:                Upper GI endoscopy Indications:              Dysphagia, last exam limited by retained food and                            dilation not done, cirrhosis with small varices                            treated with Coreg Medicines:                Monitored Anesthesia Care Procedure:                Pre-Anesthesia Assessment:                           - Prior to the procedure, a History and Physical                            was performed, and patient medications and                            allergies were reviewed. The patient's tolerance of                            previous anesthesia was also reviewed. The risks                            and benefits of the procedure and the sedation                            options and risks were discussed with the patient.                            All questions were answered, and informed consent                            was obtained. Prior Anticoagulants: The patient has                            taken no previous anticoagulant or antiplatelet                            agents. ASA Grade Assessment: II - A patient with                            mild systemic disease. After reviewing the risks                            and benefits, the patient was deemed in  satisfactory condition to undergo the procedure.                           After obtaining informed consent, the endoscope was                            passed under direct vision. Throughout the                            procedure, the patient's blood pressure, pulse, and                            oxygen saturations were monitored continuously. The                            Endoscope was introduced through the  mouth, and                            advanced to the second part of duodenum. The upper                            GI endoscopy was accomplished without difficulty.                            The patient tolerated the procedure well. Scope In: Scope Out: Findings:                 Esophagogastric landmarks were identified: the                            Z-line was found at 38 cm, the gastroesophageal                            junction was found at 38 cm and the upper extent of                            the gastric folds was found at 38 cm from the                            incisors. The Z-line was just slightly irregular                            but did not meet criteria for Barrett's biopsies.                           Small (< 5 mm) varices were found in the lower                            third of the esophagus without any stigmata for                            bleeding.  The exam of the esophagus was otherwise normal.                           A guidewire was placed and the scope was withdrawn.                            Empiric dilation was performed in the entire                            esophagus with a Savary dilator with mild                            resistance at 17 mm and 18 mm. Relook endoscopy                            showed no mucosal wrents.                           Multiple small sessile polyps were found in the                            gastric antrum. Biopsies were taken with a cold                            forceps for histology as a representative sample,                            ensure no adenomatous change.                           There was a small AVM noted in the proximal                            stomach. The exam was otherwise without abnormality.                           The duodenal bulb and second portion of the                            duodenum were normal. Complications:            No immediate complications.  Estimated blood loss:                            Minimal. Estimated Blood Loss:     Estimated blood loss was minimal. Impression:               - Esophagogastric landmarks identified.                           - Small (< 5 mm) esophageal varices.                           - Normal esophagus otherwise - empiric dilation  performed to 76mm.                           - Multiple benign appearing gastric polyps.                            Biopsied.                           - Small AVM in the proximal stomach.                           - The examination was otherwise normal.                           - Normal duodenal bulb and second portion of the                            duodenum. Recommendation:           - Patient has a contact number available for                            emergencies. The signs and symptoms of potential                            delayed complications were discussed with the                            patient. Return to normal activities tomorrow.                            Written discharge instructions were provided to the                            patient.                           - Resume previous diet.                           - Continue present medications.                           - Await pathology results. Remo Lipps P. Armbruster, MD 05/26/2019 2:12:22 PM This report has been signed electronically.

## 2019-05-26 NOTE — Progress Notes (Signed)
June temp Courtney vitals 

## 2019-05-29 ENCOUNTER — Encounter: Payer: Self-pay | Admitting: Internal Medicine

## 2019-05-30 ENCOUNTER — Other Ambulatory Visit: Payer: Self-pay

## 2019-05-30 MED ORDER — LISINOPRIL 20 MG PO TABS: 20.0000 mg | ORAL_TABLET | Freq: Every day | ORAL | 1 refills | Status: DC

## 2019-05-31 ENCOUNTER — Ambulatory Visit (INDEPENDENT_AMBULATORY_CARE_PROVIDER_SITE_OTHER): Payer: Medicare PPO | Admitting: Pharmacist

## 2019-05-31 ENCOUNTER — Telehealth: Payer: Self-pay

## 2019-05-31 ENCOUNTER — Ambulatory Visit (INDEPENDENT_AMBULATORY_CARE_PROVIDER_SITE_OTHER): Payer: Medicare PPO

## 2019-05-31 ENCOUNTER — Other Ambulatory Visit: Payer: Self-pay

## 2019-05-31 VITALS — BP 118/78 | HR 72 | Temp 98.4°F | Ht 67.0 in | Wt 234.0 lb

## 2019-05-31 DIAGNOSIS — E782 Mixed hyperlipidemia: Secondary | ICD-10-CM | POA: Diagnosis not present

## 2019-05-31 DIAGNOSIS — I10 Essential (primary) hypertension: Secondary | ICD-10-CM | POA: Diagnosis not present

## 2019-05-31 DIAGNOSIS — E1140 Type 2 diabetes mellitus with diabetic neuropathy, unspecified: Secondary | ICD-10-CM

## 2019-05-31 DIAGNOSIS — Z23 Encounter for immunization: Secondary | ICD-10-CM

## 2019-05-31 NOTE — Progress Notes (Signed)
Pt received flu shot

## 2019-05-31 NOTE — Telephone Encounter (Signed)
  Follow up Call-  Call back number 05/26/2019 05/19/2019 02/03/2017  Post procedure Call Back phone  # [phone]- wife Vito Backers [phone]  video phone  Permission to leave phone message Yes Yes Yes  Some recent data might be hidden     Patient questions:  Do you have a fever, pain , or abdominal swelling? No. Pain Score  0 *  Have you tolerated food without any problems? Yes.    Have you been able to return to your normal activities? Yes.    Do you have any questions about your discharge instructions: Diet   No. Medications  No. Follow up visit  No.  Do you have questions or concerns about your Care? No.  Actions: * If pain score is 4 or above: 1. No action needed, pain <4.Have you developed a fever since your procedure? no  2.   Have you had an respiratory symptoms (SOB or cough) since your procedure? no  3.   Have you tested positive for COVID 19 since your procedure no  4.   Have you had any family members/close contacts diagnosed with the COVID 19 since your procedure?  no   If yes to any of these questions please route to Joylene John, RN and Alphonsa Gin, Therapist, sports.

## 2019-06-04 ENCOUNTER — Other Ambulatory Visit: Payer: Self-pay | Admitting: Nurse Practitioner

## 2019-06-06 ENCOUNTER — Other Ambulatory Visit: Payer: Self-pay

## 2019-06-06 DIAGNOSIS — E1165 Type 2 diabetes mellitus with hyperglycemia: Secondary | ICD-10-CM

## 2019-06-06 MED ORDER — METFORMIN HCL ER 500 MG PO TB24: ORAL_TABLET | ORAL | 0 refills | Status: DC

## 2019-06-06 NOTE — Progress Notes (Signed)
Chronic Care Management   Initial Visit Note  05/31/2019 Name: Stephen Chase MRN: [id number] DOB: 1966-03-10  Referred by: Stephen Mattocks, PA-C Reason for referral : Chronic Care Management   Stephen Chase is a 53 y.o. year old male who is a primary care patient of Stephen Chase. The CCM team was consulted for assistance with chronic disease management and care coordination needs.   Review of patient status, including review of consultants reports, relevant laboratory and other test results, and collaboration with appropriate care team members and the patient's provider was performed as part of comprehensive patient evaluation and provision of chronic care management services.    I met with Stephen Chase in clinic today.  He was joined by a sign language interpreter.  Advanced Directives Status: N See Care Plan and Vynca application for related entries.   Medications: Outpatient Encounter Medications as of 05/31/2019  Medication Sig  . ACCU-CHEK GUIDE test strip CHECK BLOOD SUGAR 3 TIMES DAILY BEFORE BREAKFAST, LUNCH AND DINNER  . aspirin EC 81 MG tablet Take 81 mg by mouth daily.  . carvedilol (COREG) 6.25 MG tablet TAKE 1 TABLET BY MOUTH TWICE DAILY WITH FOOD  . fenofibrate (TRICOR) 145 MG tablet TAKE 1 TABLET BY MOUTH DAILY  . Icosapent Ethyl (VASCEPA) 1 g CAPS One bid for elevated triglycerides  . Insulin Pen Needle (PEN NEEDLES) 32G X 4 MM MISC Inject 1 each into the skin daily.  Marland Kitchen lisinopril (ZESTRIL) 20 MG tablet Take 1 tablet (20 mg total) by mouth daily.  . metFORMIN (GLUCOPHAGE XR) 500 MG 24 hr tablet 2 BID WITH BREAKFAST AND DINNER  . modafinil (PROVIGIL) 200 MG tablet Take 1 tablet (200 mg total) by mouth daily.  . Multiple Vitamins-Minerals (MULTIVITAMIN WITH MINERALS) tablet Take 1 tablet by mouth daily.  . Niacin, Antihyperlipidemic, (NIACIN ER, ANTIHYPERLIPIDEMIC, PO) Take 2,000 mg by mouth at bedtime.   Marland Kitchen omeprazole (PRILOSEC) 10 MG capsule  TAKE 1 CAPSULE BY MOUTH EVERY DAY BEFORE A MEAL (Patient not taking: Reported on 05/26/2019)  . OVER THE COUNTER MEDICATION Vitamin D 3 One capsule daily.  . Semaglutide,0.25 or 0.'5MG'$ /DOS, (OZEMPIC, 0.25 OR 0.5 MG/DOSE,) 2 MG/1.5ML SOPN Inject 0.5 mg into the skin once a week.  . tamsulosin (FLOMAX) 0.4 MG CAPS capsule TAKE 1 CAPSULE BY MOUTH ONCE DAILY 1/2 HOUR FOLLOWING THE SAME MEAL EACH DAY (Patient not taking: Reported on 05/26/2019)  . [DISCONTINUED] atorvastatin (LIPITOR) 40 MG tablet TAKE 1 TABLET BY MOUTH AT BEDTIME   No facility-administered encounter medications on file as of 05/31/2019.      Objective:   Goals Addressed            This Visit's Progress     Patient Stated   . I would like to apply for financial assistance for my medications (pt-stated)       Current Barriers:  . Financial Barriers: patient has [employer] and reports copay for Ozempic is cost prohibitive at this time  Pharmacist Clinical Goal(s):  Marland Kitchen Over the next 30 days, patient will work with PharmD and providers to relieve medication access concerns  Interventions: Met with patient in clinic face to face . Comprehensive medication review completed; medication list updated in electronic medical record.  Stephen Chase by NovoNordisk: Patient may meets income (pending wife's)/NO out of pocket spend criteria for this medication's patient assistance program. Reviewed application process. Patient will provide proof of income, out of pocket spend report, and will sign application. Will collaborate with primary care provider  Stephen Chase for their portion of application. Once completed, will submit to NovoNordisk patient assistance program. . If patient does not qualify, we will apply for [employer] program for Trulicity  Patient Self Care Activities:  . Patient will provide necessary portions of application   Initial goal documentation      . I would like to optimize my medication management  of my chronic conditions (pt-stated)          Stephen Chase was given information about Chronic Care Management services today including:  1. CCM service includes personalized support from designated clinical staff supervised by his physician, including individualized plan of care and coordination with other care providers 2. 24/7 contact phone numbers for assistance for urgent and routine care needs. 3. Service will only be billed when office clinical staff spend 20 minutes or more in a month to coordinate care. 4. Only one practitioner may furnish and bill the service in a calendar month. 5. The patient may stop CCM services at any time (effective at the end of the month) by phone call to the office staff. 6. The patient will be responsible for cost sharing (co-pay) of up to 20% of the service fee (after annual deductible is met).  Patient agreed to services and verbal consent obtained (by way of sign language interpreter).   Plan:   The care management team will reach out to the patient again over the next 4 weeks.  Stephen Chase, PharmD, BCPS Clinical Pharmacist, [facility] [place]: [phone]

## 2019-06-06 NOTE — Patient Instructions (Signed)
Visit Information  Goals Addressed            This Visit's Progress     Patient Stated   . I would like to apply for financial assistance for my medications (pt-stated)       Current Barriers:  . Financial Barriers: patient has [employer] and reports copay for Ozempic is cost prohibitive at this time  Pharmacist Clinical Goal(s):  Marland Kitchen Over the next 30 days, patient will work with PharmD and providers to relieve medication access concerns  Interventions: Met with patient in clinic face to face . Comprehensive medication review completed; medication list updated in electronic medical record.  Larna Daughters by NovoNordisk: Patient may meets income (pending wife's)/NO out of pocket spend criteria for this medication's patient assistance program. Reviewed application process. Patient will provide proof of income, out of pocket spend report, and will sign application. Will collaborate with primary care provider [facility] for their portion of application. Once completed, will submit to NovoNordisk patient assistance program. . If patient does not qualify, we will apply for [employer] program for Trulicity  Patient Self Care Activities:  . Patient will provide necessary portions of application   Initial goal documentation      . I would like to optimize my medication management of my chronic conditions (pt-stated)       Current Barriers:  . Diabetes: T2DM; most recent A1c 6.7% on 03/09/19 (decrease from 8.2% on 09/27/18)  . Current antihyperglycemic regimen: Ozempic 0.'5mg'$  weekly, metformin 1g BID . denies hypoglycemic symptoms; denies hyperglycemic symptoms . Current exercise: n/a . Current blood glucose readings: FBG 100-110s . Cardiovascular risk reduction: o Current hypertensive regimen: coreg, lisinopril o Current hyperlipidemia regimen: atorvastatin '20mg'$  daily  Pharmacist Clinical Goal(s):  Marland Kitchen Over the next 90 days, patient with work with PharmD and primary care  provider to address needs related to optimized medication management of chronic conditions  Interventions: . Comprehensive medication review performed, medication list updated in electronic medical record . Comprehensive medication review performed.  Reviewed medication fill history via insurance claims data confirming patient appears compliant with having his medications filled on time as prescribed by provider. . Reviewed & discussed the following diabetes-related information with patient: o Continue checking blood sugars as directed o Follow ADA recommended "diabetes-friendly" diet  (reviewed healthy snack/food options) o Reviewed medication purpose/side effects-->patient denies adverse events o Continue taking all medications as prescribed by provider  Patient Self Care Activities:  . Patient will check blood glucose daily , document, and provide at future appointments . Patient will take medications as prescribed . Patient will contact provider with any episodes of hypoglycemia . Patient will report any questions or concerns to provider   Initial goal documentation        The patient verbalized understanding of instructions provided today and declined a print copy of patient instruction materials.   The care management team will reach out to the patient again over the next 4 weeks.   Regina Eck, PharmD, BCPS Clinical Pharmacist, [facility] [place]: [phone]

## 2019-06-07 ENCOUNTER — Other Ambulatory Visit: Payer: Self-pay

## 2019-06-07 ENCOUNTER — Encounter: Payer: Self-pay | Admitting: Internal Medicine

## 2019-06-07 ENCOUNTER — Other Ambulatory Visit: Payer: Self-pay | Admitting: Pharmacy Technician

## 2019-06-07 DIAGNOSIS — E1140 Type 2 diabetes mellitus with diabetic neuropathy, unspecified: Secondary | ICD-10-CM

## 2019-06-07 MED ORDER — ACCU-CHEK GUIDE VI STRP: ORAL_STRIP | 1 refills | Status: DC

## 2019-06-07 NOTE — Patient Outreach (Signed)
[facility]) Care Management  06/07/2019  Stephen Chase 04/21/1966 [id number]                                       Medication Assistance Referral  Referral From: [place]. (Clinic RPh)  Medication/Company: Larna Daughters / Novo Nordisk Patient application portion:  N/A JP Provider application portion:  N/A JP  Provider address/fax verified via:  N/A JP  Faxed completed application and required documents into [employer]  Follow up:  Will follow up with company in 5-7 business days to check application status.  Maud Deed Chana Bode [facility] Certified Pharmacy Technician [place] Management Direct Dial:[phone]

## 2019-06-09 ENCOUNTER — Encounter: Payer: Self-pay | Admitting: Internal Medicine

## 2019-06-13 ENCOUNTER — Other Ambulatory Visit: Payer: Self-pay | Admitting: Pharmacy Technician

## 2019-06-13 NOTE — Patient Outreach (Signed)
[facility]) Care Management  06/13/2019  Lydell Grimstead 23-Dec-1965 [id number]    Follow up call placed to [place] regarding patient assistance application(s) for Ozempic , Gabriel Cirri confirms patient has been approved as of 9/21 until 09/21/19. Medication to arrive at providers office in 10-14 business days. Order [id number]  Follow up:  Will route note to [employer] [facility] [place] and [place] @ [facility] to inform.  Maud Deed Chana Bode [place] Certified Pharmacy Technician [place] Management Direct Dial:[phone]

## 2019-06-15 ENCOUNTER — Other Ambulatory Visit: Payer: Self-pay

## 2019-06-15 ENCOUNTER — Encounter: Payer: Self-pay | Admitting: Internal Medicine

## 2019-06-15 ENCOUNTER — Ambulatory Visit (INDEPENDENT_AMBULATORY_CARE_PROVIDER_SITE_OTHER): Payer: Medicare PPO | Admitting: Internal Medicine

## 2019-06-15 VITALS — BP 126/72 | HR 74 | Temp 98.1°F | Ht 67.0 in | Wt 221.0 lb

## 2019-06-15 DIAGNOSIS — N289 Disorder of kidney and ureter, unspecified: Secondary | ICD-10-CM | POA: Diagnosis not present

## 2019-06-15 DIAGNOSIS — I8500 Esophageal varices without bleeding: Secondary | ICD-10-CM | POA: Diagnosis not present

## 2019-06-15 DIAGNOSIS — E1140 Type 2 diabetes mellitus with diabetic neuropathy, unspecified: Secondary | ICD-10-CM

## 2019-06-15 DIAGNOSIS — I10 Essential (primary) hypertension: Secondary | ICD-10-CM

## 2019-06-15 LAB — CBC
Hematocrit: 45.2 % (ref 37.5–51.0)
Hemoglobin: 14.8 g/dL (ref 13.0–17.7)
MCH: 29.8 pg (ref 26.6–33.0)
MCHC: 32.7 g/dL (ref 31.5–35.7)
MCV: 91 fL (ref 79–97)
Platelets: 140 10*3/uL — ABNORMAL LOW (ref 150–450)
RBC: 4.96 x10E6/uL (ref 4.14–5.80)
RDW: 12.5 % (ref 11.6–15.4)
WBC: 6.3 10*3/uL (ref 3.4–10.8)

## 2019-06-15 LAB — HEMOGLOBIN A1C
Est. average glucose Bld gHb Est-mCnc: 137 mg/dL
Hgb A1c MFr Bld: 6.4 % — ABNORMAL HIGH (ref 4.8–5.6)

## 2019-06-15 NOTE — Progress Notes (Signed)
Subjective:     Patient ID: Stephen Chase , male    DOB: 10-07-1965 , 53 y.o.   MRN: [id number]   Chief Complaint  Patient presents with  . Follow-up    DM check    HPI  Here for DM FU. Fasting glucose 90-155, post prandial 117. Has been approved by pt assistance for the Ozempic which he has been taking 0.5 mg /week. He admits he has not been as active working in the past few weeks like he did this Summer. Had an endoscopy and had esophageal dilatation and he is swallowing well now.   Past Medical History:  Diagnosis Date  . Allergy   . Arthritis    SHOULDERS  . Balanitis   . Deafness, sensorineural, childhood onset    BILATERAL SINCE AGE 63  SECONDARY TO UNKNOWN ILLNESS  . GERD (gastroesophageal reflux disease)   . Hyperlipidemia   . Hypertension   . No blood products    Patient is Jehovah Witness no blood products.  . OSA on CPAP    SEVERE PER STUDY 03-09-2009  . Sleep apnea    uses a cpap  . Smokers' cough ([place])   . Type 2 diabetes mellitus (HCC)      Family History  Problem Relation Age of Onset  . Cancer Mother        type unknown pt was 5 years old at that time  . Liver disease Father   . Healthy Brother   . Cancer Other         alot of family members have cancer but doesnt know the type  . Colon cancer Neg Hx   . Esophageal cancer Neg Hx   . Rectal cancer Neg Hx   . Stomach cancer Neg Hx      Current Outpatient Medications:  .  aspirin EC 81 MG tablet, Take 81 mg by mouth daily., Disp: , Rfl:  .  atorvastatin (LIPITOR) 40 MG tablet, TAKE 1 TABLET BY MOUTH AT BEDTIME, Disp: 90 tablet, Rfl: 0 .  carvedilol (COREG) 6.25 MG tablet, TAKE 1 TABLET BY MOUTH TWICE DAILY WITH FOOD, Disp: 180 tablet, Rfl: 1 .  fenofibrate (TRICOR) 145 MG tablet, TAKE 1 TABLET BY MOUTH DAILY, Disp: 90 tablet, Rfl: 1 .  lisinopril (ZESTRIL) 20 MG tablet, Take 1 tablet (20 mg total) by mouth daily., Disp: 90 tablet, Rfl: 1 .  metFORMIN (GLUCOPHAGE XR) 500 MG 24 hr tablet, 2 BID WITH  BREAKFAST AND DINNER, Disp: 360 tablet, Rfl: 0 .  modafinil (PROVIGIL) 200 MG tablet, Take 1 tablet (200 mg total) by mouth daily., Disp: 30 tablet, Rfl: 5 .  omeprazole (PRILOSEC) 10 MG capsule, TAKE 1 CAPSULE BY MOUTH EVERY DAY BEFORE A MEAL, Disp: 90 capsule, Rfl: 1 .  OVER THE COUNTER MEDICATION, Vitamin D 3 One capsule daily., Disp: , Rfl:  .  Semaglutide,0.25 or 0.5MG /DOS, (OZEMPIC, 0.25 OR 0.5 MG/DOSE,) 2 MG/1.5ML SOPN, Inject 0.5 mg into the skin once a week., Disp: 3 pen, Rfl: 1 .  tamsulosin (FLOMAX) 0.4 MG CAPS capsule, TAKE 1 CAPSULE BY MOUTH ONCE DAILY 1/2 HOUR FOLLOWING THE SAME MEAL EACH DAY, Disp: 90 capsule, Rfl: 1 .  glucose blood (ACCU-CHEK GUIDE) test strip, CHECK BLOOD SUGAR 3 TIMES DAILY BEFORE BREAKFAST, LUNCH AND DINNER   Dx:e11.9 (Patient not taking: Reported on 06/15/2019), Disp: 150 each, Rfl: 1 .  Icosapent Ethyl (VASCEPA) 1 g CAPS, One bid for elevated triglycerides, Disp: 180 capsule, Rfl: 0 .  Insulin Pen  Needle (PEN NEEDLES) 32G X 4 MM MISC, Inject 1 each into the skin daily. (Patient not taking: Reported on 06/15/2019), Disp: 30 each, Rfl: 2 .  Multiple Vitamins-Minerals (MULTIVITAMIN WITH MINERALS) tablet, Take 1 tablet by mouth daily., Disp: , Rfl:  .  Niacin, Antihyperlipidemic, (NIACIN ER, ANTIHYPERLIPIDEMIC, PO), Take 2,000 mg by mouth at bedtime. , Disp: , Rfl:    No Known Allergies   Review of Systems  Review of Systems  Constitutional: Negative for diaphoresis and unexpected weight change.  HENT: Negative for tinnitus.   Eyes: Negative for visual disturbance.  Respiratory: Negative for chest tightness and shortness of breath.   Cardiovascular: Negative for chest pain, palpitations and leg swelling.  Gastrointestinal: Negative for constipation, diarrhea and nausea.  Endocrine: Negative for polydipsia, polyphagia and polyuria.  Genitourinary: Negative for dysuria and frequency.  Skin: Negative for rash and wound.  Neurological: Negative for dizziness,  speech difficulty, weakness, numbness and headaches.  Today's Vitals   06/15/19 0942  BP: 126/72  Pulse: 74  Temp: 98.1 F (36.7 C)  TempSrc: Oral  Weight: 221 lb (100.2 kg)  Height: 5\' 7"  (1.702 m)   Body mass index is 34.61 kg/m.   Objective:  Physical Exam   Constitutional: She is oriented to person, place, and time. She appears well-developed and well-nourished. No distress.  HENT:  Head: Normocephalic and atraumatic.  Right Ear: External ear normal.  Left Ear: External ear normal.  Nose: Nose normal.  Eyes: Conjunctivae are normal. Right eye exhibits no discharge. Left eye exhibits no discharge. No scleral icterus.  Neck: Neck supple. No thyromegaly present.  No carotid bruits bilaterally  Cardiovascular: Normal rate and regular rhythm.  No murmur heard. Pulmonary/Chest: Effort normal and breath sounds normal. No respiratory distress.  Musculoskeletal: Normal range of motion. She exhibits no edema.  Lymphadenopathy:    She has no cervical adenopathy.  Neurological: She is alert and oriented to person, place, and time.  Skin: Skin is warm and dry. Capillary refill takes less than 2 seconds. No rash noted. She is not diaphoretic.  Psychiatric: She has a normal mood and affect. Her behavior is normal. Judgment and thought content normal.  Nursing note reviewed. Assessment And Plan:    1. Abnormal renal function- chronic. I am hoping this is better since we d/c Metformin - COMPLETE METABOLIC PANEL WITH GFR  2. Type 2 diabetes mellitus with diabetic neuropathy, without long-term current use of insulin ([place])-  - Hemoglobin A1c     May continue Ozempic and FU in 3 months. Needs to be mindful about his eating and exercise.  3. Essential hypertension- stable - CBC no Diff 4- Esophageal varices - improved. Needs to continue medication prescribed by GI.    Jeani Fassnacht ROD[place]IG[place]TH[place], PA-C    THE PATIENT IS ENCOURAGED TO PRACTICE SOCIAL DISTANCING DUE TO THE COVID-19  PANDEMIC.

## 2019-06-22 ENCOUNTER — Other Ambulatory Visit: Payer: Self-pay | Admitting: Internal Medicine

## 2019-06-22 DIAGNOSIS — I10 Essential (primary) hypertension: Secondary | ICD-10-CM

## 2019-06-22 NOTE — Progress Notes (Signed)
CMP not ran last week, added for today on next week

## 2019-06-26 ENCOUNTER — Telehealth: Payer: Self-pay

## 2019-06-26 NOTE — Telephone Encounter (Signed)
LVM for pt to stop by the office to get lab work that was missed done

## 2019-06-27 ENCOUNTER — Encounter: Payer: Self-pay | Admitting: Internal Medicine

## 2019-06-27 ENCOUNTER — Telehealth: Payer: Self-pay

## 2019-06-28 ENCOUNTER — Other Ambulatory Visit: Payer: Self-pay | Admitting: Internal Medicine

## 2019-06-28 DIAGNOSIS — N289 Disorder of kidney and ureter, unspecified: Secondary | ICD-10-CM | POA: Diagnosis not present

## 2019-06-29 LAB — CMP14+EGFR
ALT: 31 IU/L (ref 0–44)
AST: 30 IU/L (ref 0–40)
Albumin/Globulin Ratio: 2.2 (ref 1.2–2.2)
Albumin: 4.6 g/dL (ref 3.8–4.9)
Alkaline Phosphatase: 76 IU/L (ref 39–117)
BUN/Creatinine Ratio: 14 (ref 9–20)
BUN: 12 mg/dL (ref 6–24)
Bilirubin Total: 0.4 mg/dL (ref 0.0–1.2)
CO2: 25 mmol/L (ref 20–29)
Calcium: 9.6 mg/dL (ref 8.7–10.2)
Chloride: 104 mmol/L (ref 96–106)
Creatinine, Ser: 0.88 mg/dL (ref 0.76–1.27)
GFR calc Af Amer: 114 mL/min/{1.73_m2} (ref >59)
GFR calc non Af Amer: 99 mL/min/{1.73_m2} (ref >59)
Globulin, Total: 2.1 g/dL (ref 1.5–4.5)
Glucose: 174 mg/dL — ABNORMAL HIGH (ref 65–99)
Potassium: 4.1 mmol/L (ref 3.5–5.2)
Sodium: 141 mmol/L (ref 134–144)
Total Protein: 6.7 g/dL (ref 6.0–8.5)

## 2019-07-12 ENCOUNTER — Other Ambulatory Visit: Payer: Self-pay

## 2019-07-12 MED ORDER — FENOFIBRATE 145 MG PO TABS: 145.0000 mg | ORAL_TABLET | Freq: Every day | ORAL | 1 refills | Status: DC

## 2019-07-17 ENCOUNTER — Telehealth: Payer: Self-pay

## 2019-07-19 ENCOUNTER — Telehealth: Payer: Self-pay

## 2019-07-25 DIAGNOSIS — H524 Presbyopia: Secondary | ICD-10-CM | POA: Diagnosis not present

## 2019-07-25 DIAGNOSIS — H52203 Unspecified astigmatism, bilateral: Secondary | ICD-10-CM | POA: Diagnosis not present

## 2019-07-25 DIAGNOSIS — H5213 Myopia, bilateral: Secondary | ICD-10-CM | POA: Diagnosis not present

## 2019-07-25 DIAGNOSIS — E119 Type 2 diabetes mellitus without complications: Secondary | ICD-10-CM | POA: Diagnosis not present

## 2019-07-25 LAB — HM DIABETES EYE EXAM

## 2019-07-26 ENCOUNTER — Telehealth: Payer: Self-pay

## 2019-07-27 ENCOUNTER — Telehealth: Payer: Self-pay

## 2019-07-27 ENCOUNTER — Other Ambulatory Visit: Payer: Self-pay | Admitting: Internal Medicine

## 2019-07-27 NOTE — Telephone Encounter (Signed)
Called pt LVM for pt to call with the information or have them sent to the office   Please call him to tell you when his TD and pneumonia shots were and where. Please update health maintenance

## 2019-08-02 ENCOUNTER — Ambulatory Visit: Payer: Self-pay | Admitting: Pharmacist

## 2019-08-03 NOTE — Progress Notes (Signed)
  Chronic Care Management   Outreach Note  08/02/2019 Name: Stephen Chase MRN: [id number] DOB: [birth year]  Referred by: Shelby Mattocks, PA-C Reason for referral : No chief complaint on file.   An unsuccessful telephone outreach was attempted today. The patient was referred to the case management team by for assistance with care management and care coordination.   Follow Up Plan: A HIPPA compliant phone message was left for the patient providing contact information and requesting a return call.  The care management team will reach out to the patient again over the next 14 days.     Regina Eck, PharmD, BCPS Clinical Pharmacist, [facility] [place]: [phone]

## 2019-08-21 ENCOUNTER — Other Ambulatory Visit: Payer: Self-pay

## 2019-08-21 MED ORDER — TAMSULOSIN HCL 0.4 MG PO CAPS: ORAL_CAPSULE | ORAL | 1 refills | Status: DC

## 2019-08-21 MED ORDER — OMEPRAZOLE 10 MG PO CPDR: DELAYED_RELEASE_CAPSULE | ORAL | 1 refills | Status: DC

## 2019-08-21 MED ORDER — CARVEDILOL 6.25 MG PO TABS: 6.2500 mg | ORAL_TABLET | Freq: Two times a day (BID) | ORAL | 1 refills | Status: DC

## 2019-08-22 ENCOUNTER — Encounter: Payer: Self-pay | Admitting: Internal Medicine

## 2019-08-31 DIAGNOSIS — G4733 Obstructive sleep apnea (adult) (pediatric): Secondary | ICD-10-CM | POA: Diagnosis not present

## 2019-09-05 ENCOUNTER — Encounter: Payer: Self-pay | Admitting: Internal Medicine

## 2019-09-07 ENCOUNTER — Ambulatory Visit: Payer: Medicare PPO | Admitting: Internal Medicine

## 2019-09-13 ENCOUNTER — Other Ambulatory Visit: Payer: Self-pay

## 2019-09-13 MED ORDER — ATORVASTATIN CALCIUM 40 MG PO TABS: 40.0000 mg | ORAL_TABLET | Freq: Every day | ORAL | 0 refills | Status: DC

## 2019-09-14 ENCOUNTER — Ambulatory Visit: Payer: Medicare PPO | Admitting: Internal Medicine

## 2019-09-19 ENCOUNTER — Telehealth: Payer: Self-pay

## 2019-09-19 NOTE — Telephone Encounter (Signed)
LVM pt appt 09/21/2019 has been rescheduled due to provider not in office

## 2019-09-21 ENCOUNTER — Ambulatory Visit: Payer: Medicare PPO | Admitting: Internal Medicine

## 2019-09-27 ENCOUNTER — Encounter: Payer: Self-pay | Admitting: Internal Medicine

## 2019-09-28 ENCOUNTER — Ambulatory Visit: Payer: Medicare PPO | Admitting: Internal Medicine

## 2019-09-29 ENCOUNTER — Encounter: Payer: Self-pay | Admitting: Internal Medicine

## 2019-09-29 ENCOUNTER — Other Ambulatory Visit: Payer: Self-pay | Admitting: Nurse Practitioner

## 2019-09-29 DIAGNOSIS — E1140 Type 2 diabetes mellitus with diabetic neuropathy, unspecified: Secondary | ICD-10-CM

## 2019-10-02 ENCOUNTER — Other Ambulatory Visit: Payer: Self-pay

## 2019-10-04 ENCOUNTER — Ambulatory Visit: Payer: Medicare PPO | Admitting: Adult Health

## 2019-10-04 ENCOUNTER — Encounter: Payer: Self-pay | Admitting: Adult Health

## 2019-10-04 ENCOUNTER — Other Ambulatory Visit: Payer: Self-pay

## 2019-10-04 VITALS — BP 138/73 | HR 75 | Temp 96.6°F | Ht 67.0 in | Wt 233.6 lb

## 2019-10-04 DIAGNOSIS — G4710 Hypersomnia, unspecified: Secondary | ICD-10-CM

## 2019-10-04 DIAGNOSIS — Z9989 Dependence on other enabling machines and devices: Secondary | ICD-10-CM | POA: Diagnosis not present

## 2019-10-04 DIAGNOSIS — G4733 Obstructive sleep apnea (adult) (pediatric): Secondary | ICD-10-CM

## 2019-10-04 MED ORDER — MODAFINIL 200 MG PO TABS: 200.0000 mg | ORAL_TABLET | Freq: Every day | ORAL | 5 refills | Status: DC

## 2019-10-04 NOTE — Progress Notes (Signed)
PATIENT: Stephen Chase DOB: Mar 17, 1966  REASON FOR VISIT: follow up HISTORY FROM: patient  HISTORY OF PRESENT ILLNESS: Today 10/04/19:  Stephen Chase is a 54 year old male with a history of obstructive sleep apnea on CPAP.  His download indicates that he uses machine 29 out of 30 days for compliance of 97%.  He uses machine greater than 4 hours 25 days for compliance of 83%.  On average he uses his machine 5 hours and 59 minutes.  His residual AHI is 2.7 on 6 to 19 cm of water with EPR of 3.  His leak in the 95th percentile is 6.6 L/min.  He states that he continues to take modafinil.  He reports that this offers him good benefit.  He states that he was confused about Flomax and was taking it during the day and this was causing daytime sleepiness.  Now that he is taking it at bedtime his sleepiness has improved.  He returns today for an evaluation.  HISTORY (Copied from Dr.Dohmeier's note) Stephen Chase is a 54 y.o. male , seen here as in a referral  from [place] [facility] for a re- evaluation of sleep apnea,in the presence of his sign Ecologist.  09-28-2018, follow up on CPAP titration and compliance.  Stephen Chase was evaluated by a home sleep study dated 08 June 2018 by watch pat device.  The total AHI was 16.6/h there was mild to moderate snoring noticed, oxygen nadir was 89%, average heart rate was 56 bpm.  The patient had already been on CPAP before and we continued CPAP treatment with an auto titration device.  I have the opportunity to see the excellent compliance the patient has used the machine 30 out of 30 days and 26 of those days over 4 hours.  Average use of time is 5 hours 58 minutes each night the AutoSet has a pressure window between 6 cmH2O and 19 cm water pressure with 3 cm expiratory pressure relief.  The 95th percentile pressure is 16 cmH2O with an AHI of 6.2 this seems to be related to unknown events at 2.5/h and I am not quite sure where they come from.   He does not have  central apneas and he has very minimal air leakage.  I noticed that only 3 out of 30 nights had a very high AHI and this may have been a artifactual count the general average AHI is truly under 2/h.   REVIEW OF SYSTEMS: Out of a complete 14 system review of symptoms, the patient complains only of the following symptoms, and all other reviewed systems are negative.  Epworth sleepiness score 10 Fatigue severity score 18  ALLERGIES: No Known Allergies  HOME MEDICATIONS: Outpatient Medications Prior to Visit  Medication Sig Dispense Refill  . aspirin EC 81 MG tablet Take 81 mg by mouth daily.    Marland Kitchen atorvastatin (LIPITOR) 40 MG tablet Take 1 tablet (40 mg total) by mouth at bedtime. 90 tablet 0  . carvedilol (COREG) 6.25 MG tablet Take 1 tablet (6.25 mg total) by mouth 2 (two) times daily with a meal. 180 tablet 1  . fenofibrate (TRICOR) 145 MG tablet Take 1 tablet (145 mg total) by mouth daily. 90 tablet 1  . glucose blood (ACCU-CHEK GUIDE) test strip CHECK BLOOD SUGAR 3 TIMES A DAY BEFORE BREAKFAST, LUNCH, AND DINNER 150 strip 1  . Insulin Pen Needle (PEN NEEDLES) 32G X 4 MM MISC Inject 1 each into the skin daily. 30 each 2  .  lisinopril (ZESTRIL) 20 MG tablet Take 1 tablet (20 mg total) by mouth daily. 90 tablet 1  . metFORMIN (GLUCOPHAGE XR) 500 MG 24 hr tablet 2 BID WITH BREAKFAST AND DINNER 360 tablet 0  . modafinil (PROVIGIL) 200 MG tablet Take 1 tablet (200 mg total) by mouth daily. 30 tablet 5  . Multiple Vitamins-Minerals (MULTIVITAMIN WITH MINERALS) tablet Take 1 tablet by mouth daily.    . Niacin, Antihyperlipidemic, (NIACIN ER, ANTIHYPERLIPIDEMIC, PO) Take 2,000 mg by mouth at bedtime.     Marland Kitchen omeprazole (PRILOSEC) 10 MG capsule TAKE 1 CAPSULE BY MOUTH EVERY DAY BEFORE A MEAL 90 capsule 1  . OVER THE COUNTER MEDICATION Vitamin D 3 One capsule daily.    . Semaglutide,0.25 or 0.5MG /DOS, (OZEMPIC, 0.25 OR 0.5 MG/DOSE,) 2 MG/1.5ML SOPN Inject 0.5 mg into the skin once a week. 3 pen 1  .  tamsulosin (FLOMAX) 0.4 MG CAPS capsule TAKE 1 CAPSULE BY MOUTH ONCE DAILY 1/2 HOUR FOLLOWING THE SAME MEAL EACH DAY 90 capsule 1  . Icosapent Ethyl (VASCEPA) 1 g CAPS One bid for elevated triglycerides (Patient not taking: Reported on 10/04/2019) 180 capsule 0   No facility-administered medications prior to visit.    PAST MEDICAL HISTORY: Past Medical History:  Diagnosis Date  . Allergy   . Arthritis    SHOULDERS  . Balanitis   . Deafness, sensorineural, childhood onset    BILATERAL SINCE AGE 16  SECONDARY TO UNKNOWN ILLNESS  . GERD (gastroesophageal reflux disease)   . Hyperlipidemia   . Hypertension   . No blood products    Patient is Jehovah Witness no blood products.  . OSA on CPAP    SEVERE PER STUDY 03-09-2009  . Sleep apnea    uses a cpap  . Smokers' cough ([place])   . Type 2 diabetes mellitus ([place])     PAST SURGICAL HISTORY: Past Surgical History:  Procedure Laterality Date  . CIRCUMCISION N/A 12/19/2013   Procedure: CIRCUMCISION ADULT;  Surgeon: Lowella Bandy, MD;  Location[facility];  Service: Urology;  Laterality: N/A;  . SHOULDER SURGERY Right   . WRIST SURGERY Left     FAMILY HISTORY: Family History  Problem Relation Age of Onset  . Cancer Mother        type unknown pt was 39 years old at that time  . Liver disease Father   . Healthy Brother   . Cancer Other         alot of family members have cancer but doesnt know the type  . Colon cancer Neg Hx   . Esophageal cancer Neg Hx   . Rectal cancer Neg Hx   . Stomach cancer Neg Hx     SOCIAL HISTORY: Social History   Socioeconomic History  . Marital status: Married    Spouse name: Not on file  . Number of children: 2  . Years of education: Not on file  . Highest education level: Not on file  Occupational History  . Occupation: unemployed  Tobacco Use  . Smoking status: Former Smoker    Packs/day: 1.00    Years: 30.00    Pack years: 30.00    Types: Cigarettes  . Smokeless tobacco:  Never Used  . Tobacco comment: quit 4 years ago  Substance and Sexual Activity  . Alcohol use: Not Currently    Comment: rarely  . Drug use: No  . Sexual activity: Yes  Other Topics Concern  . Not on file  Social History  Narrative  . Not on file   Social Determinants of Health   Financial Resource Strain:   . Difficulty of Paying Living Expenses: Not on file  Food Insecurity: No Food Insecurity  . Worried About Charity fundraiser in the Last Year: Never true  . Ran Out of Food in the Last Year: Never true  Transportation Needs: No Transportation Needs  . Lack of Transportation (Medical): No  . Lack of Transportation (Non-Medical): No  Physical Activity: Inactive  . Days of Exercise per Week: 0 days  . Minutes of Exercise per Session: 0 min  Stress: No Stress Concern Present  . Feeling of Stress : Not at all  Social Connections:   . Frequency of Communication with Friends and Family: Not on file  . Frequency of Social Gatherings with Friends and Family: Not on file  . Attends Religious Services: Not on file  . Active Member of Clubs or Organizations: Not on file  . Attends Archivist Meetings: Not on file  . Marital Status: Not on file  Intimate Partner Violence: Not At Risk  . Fear of Current or Ex-Partner: No  . Emotionally Abused: No  . Physically Abused: No  . Sexually Abused: No      PHYSICAL EXAM  Vitals:   10/04/19 1022  BP: 138/73  Pulse: 75  Temp: (!) 96.6 F (35.9 C)  TempSrc: Oral  Weight: 233 lb 9.6 oz (106 kg)  Height: 5\' 7"  (1.702 m)   Body mass index is 36.59 kg/m.  Generalized: Well developed, in no acute distress  Chest: Lungs clear to auscultation bilaterally  Neurological examination  Mentation: Alert oriented to time, place, history taking. Follows all commands speech and language fluent Cranial nerve II-XII: Extraocular movements were full, visual field were full on confrontational test Head turning and shoulder shrug  were  normal and symmetric. Motor: The motor testing reveals 5 over 5 strength of all 4 extremities. Good symmetric motor tone is noted throughout.  Sensory: Sensory testing is intact to soft touch on all 4 extremities. No evidence of extinction is noted.  Gait and station: Gait is normal.    DIAGNOSTIC DATA (LABS, IMAGING, TESTING) - I reviewed patient records, labs, notes, testing and imaging myself where available.  Lab Results  Component Value Date   WBC 6.3 06/15/2019   HGB 14.8 06/15/2019   HCT 45.2 06/15/2019   MCV 91 06/15/2019   PLT 140 (L) 06/15/2019      Component Value Date/Time   NA 141 06/28/2019 1231   K 4.1 06/28/2019 1231   CL 104 06/28/2019 1231   CO2 25 06/28/2019 1231   GLUCOSE 174 (H) 06/28/2019 1231   GLUCOSE 126 (H) 12/19/2013 0937   BUN 12 06/28/2019 1231   CREATININE 0.88 06/28/2019 1231   CALCIUM 9.6 06/28/2019 1231   PROT 6.7 06/28/2019 1231   ALBUMIN 4.6 06/28/2019 1231   AST 30 06/28/2019 1231   ALT 31 06/28/2019 1231   ALKPHOS 76 06/28/2019 1231   BILITOT 0.4 06/28/2019 1231   GFRNONAA 99 06/28/2019 1231   GFRAA 114 06/28/2019 1231   Lab Results  Component Value Date   CHOL 131 03/09/2019   HDL 27 (L) 03/09/2019   LDLCALC 39 03/09/2019   TRIG 324 (H) 03/09/2019   CHOLHDL 4.9 03/09/2019   Lab Results  Component Value Date   HGBA1C 6.4 (H) 06/15/2019   No results found for: [id number] Lab Results  Component Value Date   TSH 1.810  03/09/2019      ASSESSMENT AND PLAN 54 y.o. year old male  has a past medical history of Allergy, Arthritis, Balanitis, Deafness, sensorineural, childhood onset, GERD (gastroesophageal reflux disease), Hyperlipidemia, Hypertension, No blood products, OSA on CPAP, Sleep apnea, Smokers' cough ([place]), and Type 2 diabetes mellitus ([place]). here with:  1. Obstructive sleep apnea on CPAP 2. Hypersomnia  The patient's CPAP download shows excellent compliance and good treatment of his apnea.  He is encouraged to  continue using CPAP nightly and greater than 4 hours each night.  Continue on Provigil 200 mg daily. he is advised that if his symptoms worsen or he develops new symptoms he should let us know.  He will follow-up in 1 year or sooner if needed     Ward Givens, MSN, NP-C 10/04/2019, 10:44 AM [facility] [address], [place], [place] [address] [phone]

## 2019-10-04 NOTE — Patient Instructions (Signed)
Your Plan:  Continue using CPAP nightly and greater than 4 hours each night Continue Modafinil  If your symptoms worsen or you develop new symptoms please let us know.    Thank you for coming to see [place] at [facility]. I hope we have been able to provide you high quality care today.  You may receive a patient satisfaction survey over the next few weeks. We would appreciate your feedback and comments so that we may continue to improve ourselves and the health of our patients.

## 2019-10-20 ENCOUNTER — Other Ambulatory Visit: Payer: Self-pay | Admitting: Nurse Practitioner

## 2019-10-20 DIAGNOSIS — E1165 Type 2 diabetes mellitus with hyperglycemia: Secondary | ICD-10-CM

## 2019-10-27 ENCOUNTER — Other Ambulatory Visit: Payer: Self-pay

## 2019-10-27 ENCOUNTER — Encounter: Payer: Self-pay | Admitting: Internal Medicine

## 2019-10-27 DIAGNOSIS — E1165 Type 2 diabetes mellitus with hyperglycemia: Secondary | ICD-10-CM

## 2019-10-27 MED ORDER — METFORMIN HCL ER 500 MG PO TB24: ORAL_TABLET | ORAL | 0 refills | Status: DC

## 2019-10-31 IMAGING — US US ABDOMEN LIMITED
1 series · 14 of 25 positions shown · non-contrast
Comparison: 06/21/2018 ultrasound, 03/10/2017 CT and prior studies

CLINICAL DATA: 53-year-old male with cirrhosis.  HCC surveillance.

EXAM:
ULTRASOUND ABDOMEN LIMITED RIGHT UPPER QUADRANT

[Series 1: us abdomen limited · 61 acquisitions, 14 frames shown]
[im 1/61]
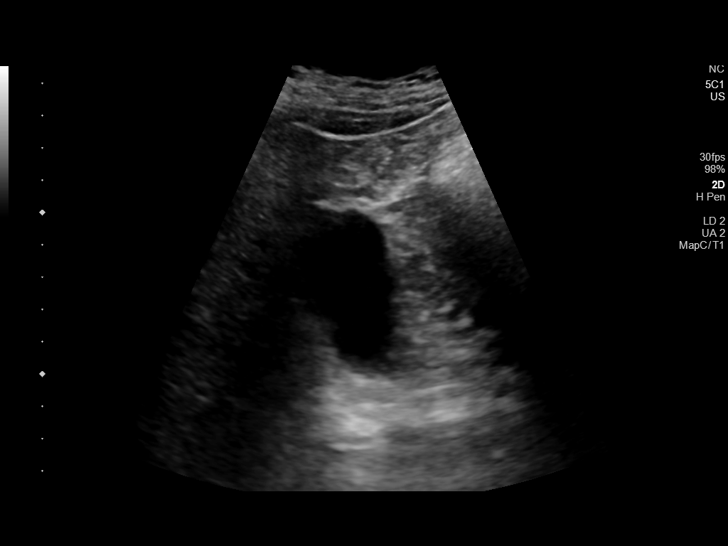
[im 6/61]
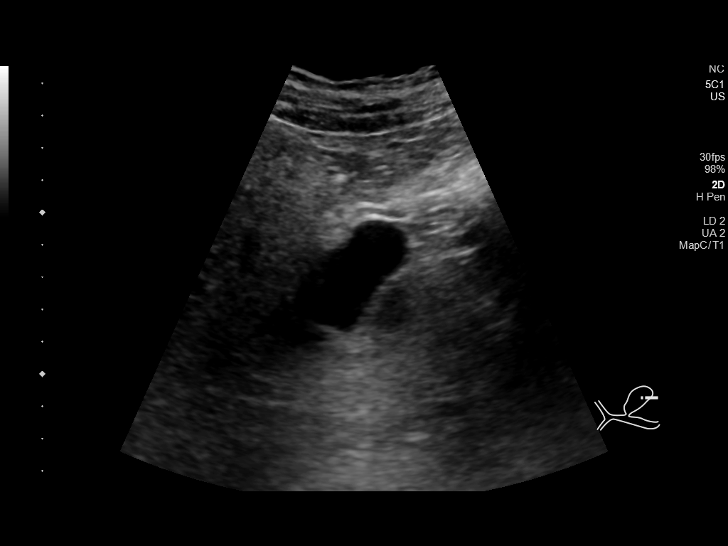
[im 11/61]
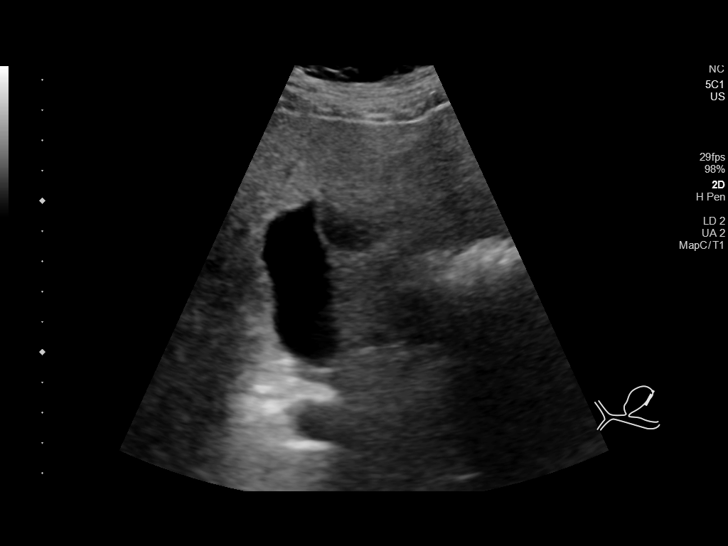
[im 16/61]
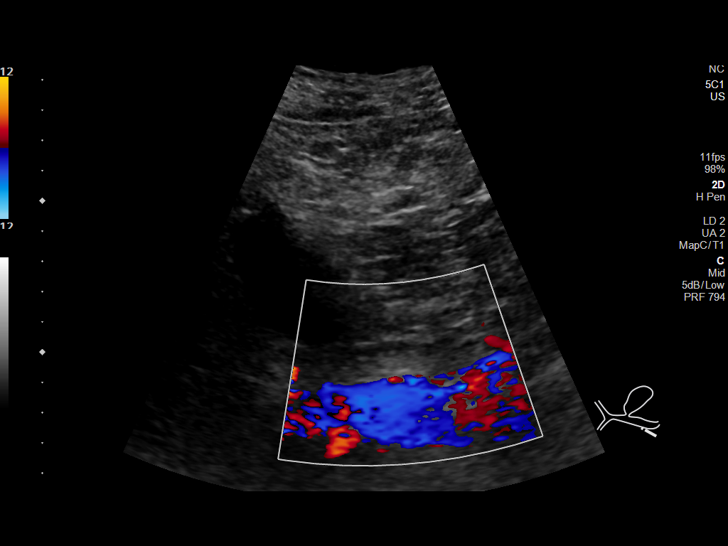
[im 21/61]
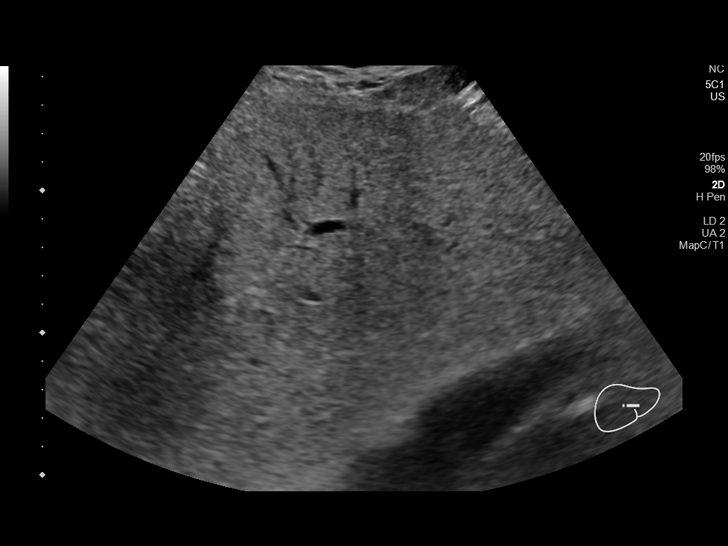
[im 23/61]
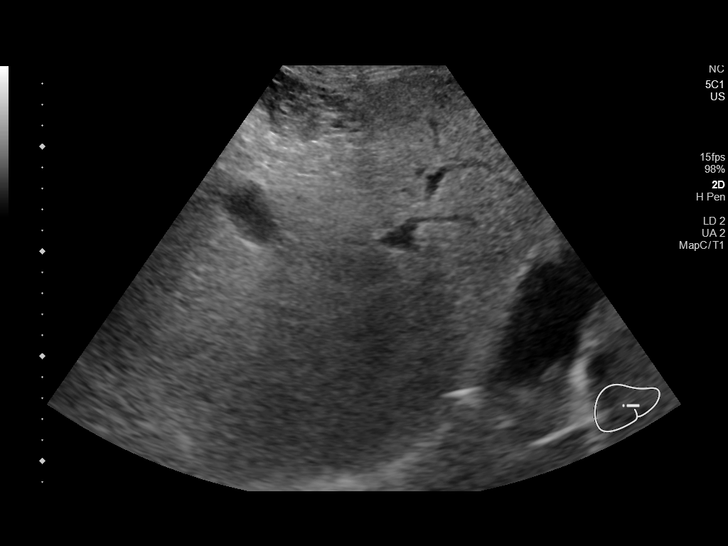
[im 28/61]
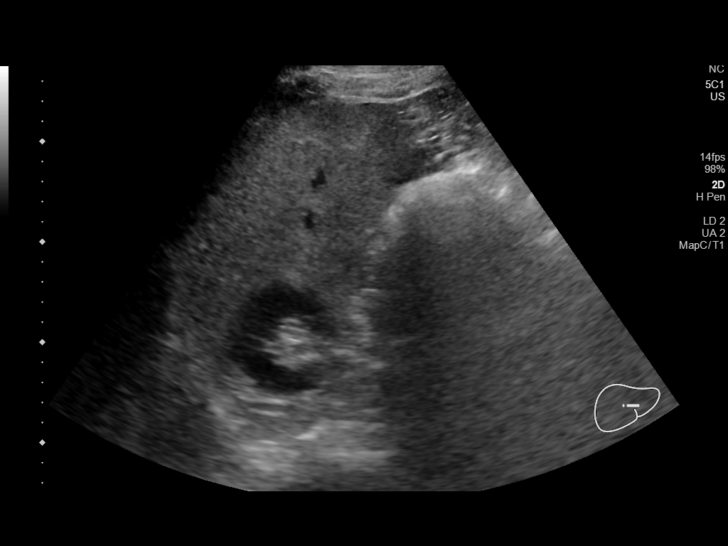
[im 33/61]
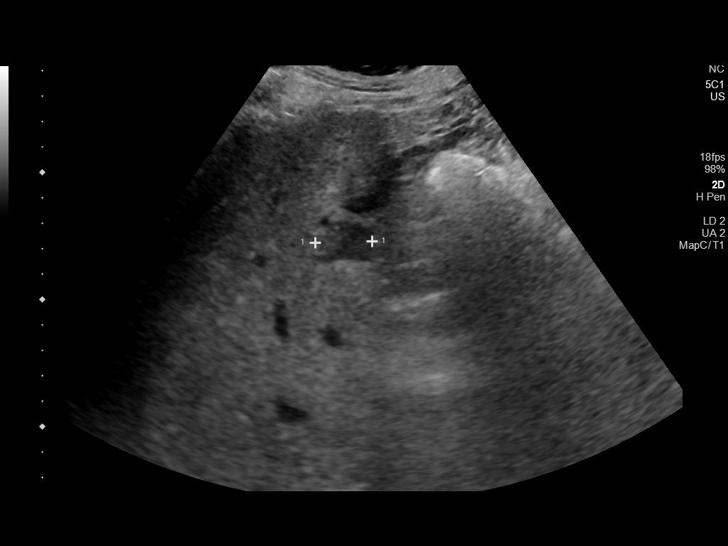
[im 38/61]
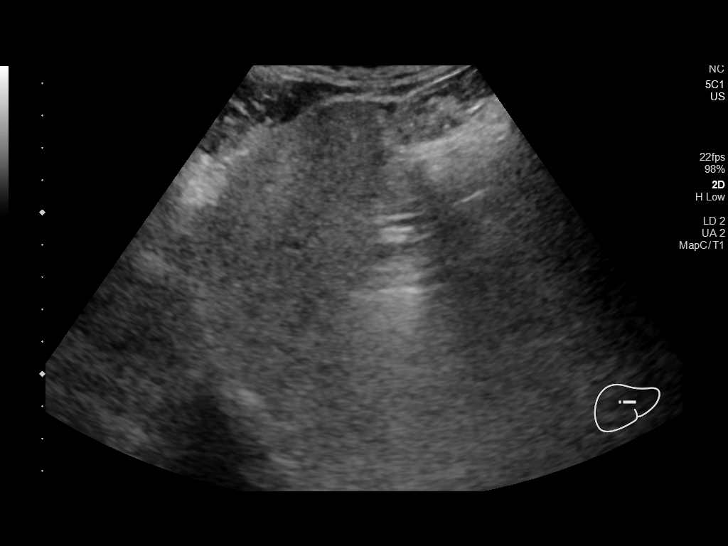
[im 41/61]
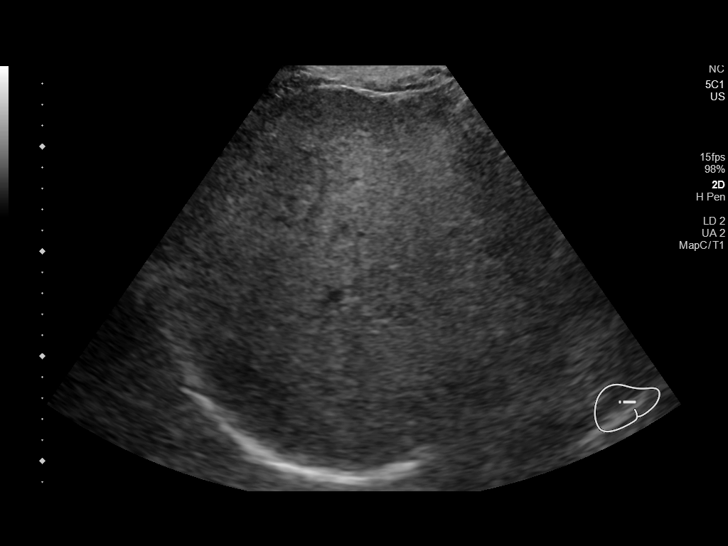
[im 46/61]
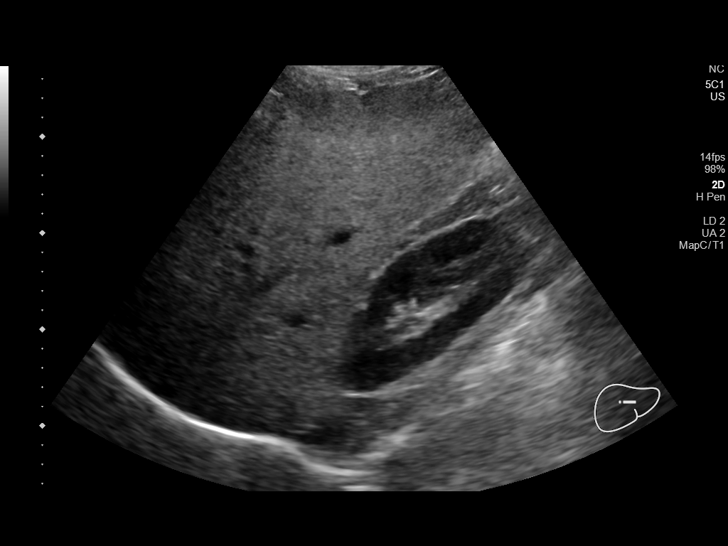
[im 51/61]
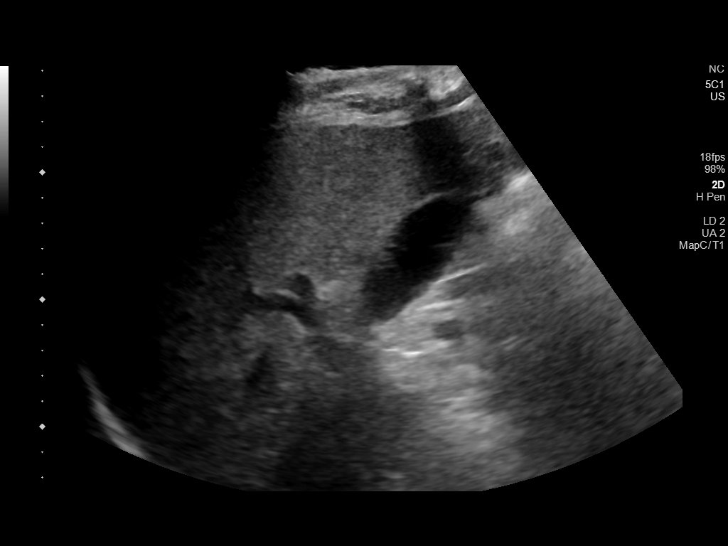
[im 56/61]
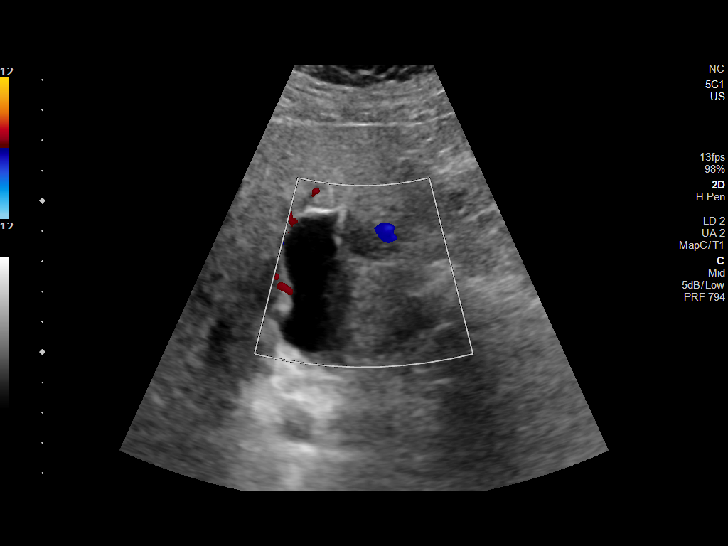
[im 61/61]
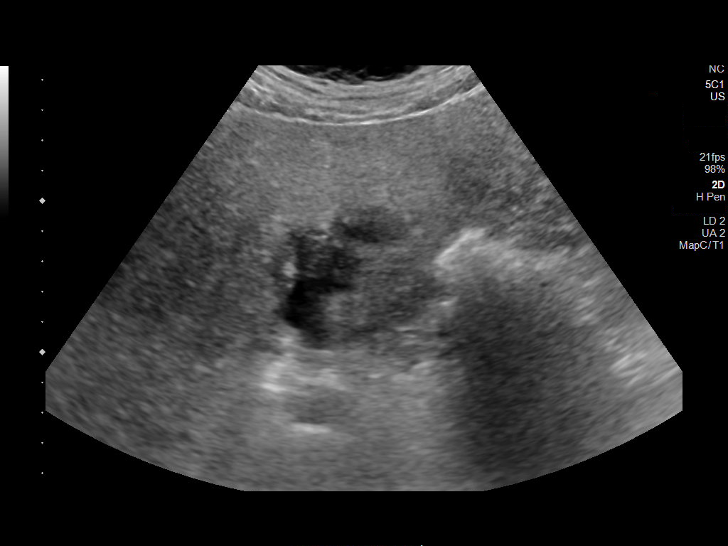

[14 of 25 positions shown; findings below may reference images not displayed]

FINDINGS: Gallbladder:

The gallbladder is unremarkable. There is no evidence of
cholelithiasis or acute cholecystitis.

Common bile duct:

Diameter: 4 mm.  No intrahepatic or extrahepatic biliary dilatation.

Liver:

Increased hepatic echogenicity noted with focal sparing adjacent to
the gallbladder. No suspicious hepatic abnormalities are noted.

No gross morphologic changes of cirrhosis are identified on this
exam.

Portal vein is patent on color Doppler imaging with normal direction
of blood flow towards the liver.

Other: None.
IMPRESSION: 1. Increased hepatic echogenicity compatible with hepatic steatosis.
No morphologic changes of cirrhosis identified on this examination.
No suspicious focal hepatic abnormalities/masses.
2. Normal hepatopetal portal vein flow.
3. Unremarkable gallbladder.  No biliary dilatation.

## 2019-11-23 ENCOUNTER — Ambulatory Visit (INDEPENDENT_AMBULATORY_CARE_PROVIDER_SITE_OTHER): Payer: Medicare PPO | Admitting: Pharmacist

## 2019-11-23 ENCOUNTER — Other Ambulatory Visit: Payer: Self-pay

## 2019-11-23 ENCOUNTER — Ambulatory Visit (INDEPENDENT_AMBULATORY_CARE_PROVIDER_SITE_OTHER): Payer: Medicare PPO | Admitting: Internal Medicine

## 2019-11-23 VITALS — BP 130/72 | HR 74 | Temp 98.0°F | Ht 67.0 in | Wt 238.4 lb

## 2019-11-23 DIAGNOSIS — I10 Essential (primary) hypertension: Secondary | ICD-10-CM | POA: Diagnosis not present

## 2019-11-23 DIAGNOSIS — E1140 Type 2 diabetes mellitus with diabetic neuropathy, unspecified: Secondary | ICD-10-CM

## 2019-11-23 DIAGNOSIS — Z6837 Body mass index (BMI) 37.0-37.9, adult: Secondary | ICD-10-CM

## 2019-11-23 DIAGNOSIS — E785 Hyperlipidemia, unspecified: Secondary | ICD-10-CM

## 2019-11-23 DIAGNOSIS — R0781 Pleurodynia: Secondary | ICD-10-CM

## 2019-11-23 DIAGNOSIS — K219 Gastro-esophageal reflux disease without esophagitis: Secondary | ICD-10-CM

## 2019-11-23 MED ORDER — OZEMPIC (0.25 OR 0.5 MG/DOSE) 2 MG/1.5ML ~~LOC~~ SOPN: PEN_INJECTOR | SUBCUTANEOUS | 0 refills | Status: AC

## 2019-11-23 NOTE — Progress Notes (Signed)
This visit occurred during the SARS-CoV-2 public health emergency.  Safety protocols were in place, including screening questions prior to the visit, additional usage of staff PPE, and extensive cleaning of exam room while observing appropriate contact time as indicated for disinfecting solutions.  Subjective:     Patient ID: Stephen Chase , male    DOB: 11/16/1965 , 54 y.o.   MRN: [id number]   Chief Complaint  Patient presents with  . Diabetes  . Back Pain    HPI 1-Pt is here for DM FU Fasting 120-130, post prandial 180-190.  He is tolerating the Ozempic fine, and is currently at 0.5 per week. He is still awaiting on getting authorization to get Ozempic approved.  2- R back x 1 month, has tried massage. Is sensitive to touch. Has tried heating pack which helped a little. This started when he was outside [place] and later on the pain came back.   Never had a rash. The pain is described as "sore and sensitive to light touch" Was sensitive from the beginning. Denies pain radiation, urinary symptoms, or paresthesia on legs.  Pain is provoked with palpation, walking, bending over. Pain is alleviated with heat for a short time. Tried BC powder a little.   Past Medical History:  Diagnosis Date  . Allergy   . Arthritis    SHOULDERS  . Balanitis   . Deafness, sensorineural, childhood onset    BILATERAL SINCE AGE 41  SECONDARY TO UNKNOWN ILLNESS  . GERD (gastroesophageal reflux disease)   . Hyperlipidemia   . Hypertension   . No blood products    Patient is Jehovah Witness no blood products.  . OSA on CPAP    SEVERE PER STUDY 03-09-2009  . Sleep apnea    uses a cpap  . Smokers' cough ([place])   . Type 2 diabetes mellitus (HCC)      Family History  Problem Relation Age of Onset  . Cancer Mother        type unknown pt was 77 years old at that time  . Liver disease Father   . Healthy Brother   . Cancer Other         alot of family members have cancer but doesnt know the type   . Colon cancer Neg Hx   . Esophageal cancer Neg Hx   . Rectal cancer Neg Hx   . Stomach cancer Neg Hx      Current Outpatient Medications:  .  aspirin EC 81 MG tablet, Take 81 mg by mouth daily., Disp: , Rfl:  .  atorvastatin (LIPITOR) 40 MG tablet, Take 1 tablet (40 mg total) by mouth at bedtime., Disp: 90 tablet, Rfl: 0 .  carvedilol (COREG) 6.25 MG tablet, Take 1 tablet (6.25 mg total) by mouth 2 (two) times daily with a meal., Disp: 180 tablet, Rfl: 1 .  fenofibrate (TRICOR) 145 MG tablet, Take 1 tablet (145 mg total) by mouth daily., Disp: 90 tablet, Rfl: 1 .  glucose blood (ACCU-CHEK GUIDE) test strip, CHECK BLOOD SUGAR 3 TIMES A DAY BEFORE BREAKFAST, LUNCH, AND DINNER, Disp: 150 strip, Rfl: 1 .  Icosapent Ethyl (VASCEPA) 1 g CAPS, One bid for elevated triglycerides (Patient not taking: Reported on 10/04/2019), Disp: 180 capsule, Rfl: 0 .  Insulin Pen Needle (PEN NEEDLES) 32G X 4 MM MISC, Inject 1 each into the skin daily., Disp: 30 each, Rfl: 2 .  lisinopril (ZESTRIL) 20 MG tablet, Take 1 tablet (20 mg total) by mouth daily.,  Disp: 90 tablet, Rfl: 1 .  metFORMIN (GLUCOPHAGE XR) 500 MG 24 hr tablet, 2 BID WITH BREAKFAST AND DINNER, Disp: 360 tablet, Rfl: 0 .  modafinil (PROVIGIL) 200 MG tablet, Take 1 tablet (200 mg total) by mouth daily., Disp: 30 tablet, Rfl: 5 .  Multiple Vitamins-Minerals (MULTIVITAMIN WITH MINERALS) tablet, Take 1 tablet by mouth daily., Disp: , Rfl:  .  Niacin, Antihyperlipidemic, (NIACIN ER, ANTIHYPERLIPIDEMIC, PO), Take 2,000 mg by mouth at bedtime. , Disp: , Rfl:  .  omeprazole (PRILOSEC) 10 MG capsule, TAKE 1 CAPSULE BY MOUTH EVERY DAY BEFORE A MEAL, Disp: 90 capsule, Rfl: 1 .  OVER THE COUNTER MEDICATION, Vitamin D 3 One capsule daily., Disp: , Rfl:  .  Semaglutide,0.25 or 0.5MG /DOS, (OZEMPIC, 0.25 OR 0.5 MG/DOSE,) 2 MG/1.5ML SOPN, Inject 0.5 mg into the skin once a week., Disp: 3 pen, Rfl: 1 .  tamsulosin (FLOMAX) 0.4 MG CAPS capsule, TAKE 1 CAPSULE BY MOUTH  ONCE DAILY 1/2 HOUR FOLLOWING THE SAME MEAL EACH DAY, Disp: 90 capsule, Rfl: 1   No Known Allergies   Review of Systems  Review of Systems  Constitutional: Negative for diaphoresis and unexpected weight change.  HENT: Negative for tinnitus.   Eyes: Negative for visual disturbance.  Respiratory: Negative for chest tightness and shortness of breath.   Cardiovascular: Negative for chest pain, palpitations and leg swelling.  Gastrointestinal: Negative for constipation, diarrhea and nausea.  Endocrine: Negative for polydipsia, polyphagia and polyuria.  Genitourinary: Negative for dysuria and frequency.  Skin: Negative for rash and wound.   Muscular- + for R back pain.  Neurological: Negative for dizziness, speech difficulty, weakness, numbness and headaches.   Today's Vitals   11/23/19 1545  BP: 130/72  Pulse: 74  Temp: 98 F (36.7 C)  Weight: 238 lb 6.4 oz (108.1 kg)  Height: 5\' 7"  (1.702 m)   Body mass index is 37.34 kg/m.   Objective:  Physical Exam  Weight is up 5 lbs  Constitutional: She is oriented to person, place, and time. he appears well-developed and well-nourished. No distress.  HENT:  Head: Normocephalic and atraumatic.  Right Ear: External ear normal.  Left Ear: External ear normal.  Nose: Nose normal.  Eyes: Conjunctivae are normal. Right eye exhibits no discharge. Left eye exhibits no discharge. No scleral icterus.  Neck: Neck supple. No thyromegaly present.  No carotid bruits bilaterally  Cardiovascular: Normal rate and regular rhythm.  No murmur heard. Pulmonary/Chest: Effort normal and breath sounds normal. No respiratory distress.  Musculoskeletal: Normal range of motion of upper and lower extremities. He exhibits no edema. BACK- no kyphosis or scoliosis noted. There are no rashes. He does not have hyperalgesia on area of pain. Has moderate local tenderness on lower ribs region. Spine ROM is normal. Has neg SLR. Lymphadenopathy: he has no cervical  adenopathy.  Neurological: he is alert and oriented to person, place, and time.  Skin: Skin is warm and dry. Capillary refill takes less than 2 seconds. No rash noted. he is not diaphoretic.  Psychiatric: he has a normal mood and affect. His behavior is normal. Judgment and thought content normal.  Nursing note reviewed. Assessment And Plan:    1. Rib pain on right side- acute - DG Ribs Unilateral Right; Future 2. Type 2 diabetes mellitus with diabetic neuropathy, without long-term current use of insulin (HCC)-chronic but his post prandial glucose are high.  His insurance has not approved the Ozempic yet. I gave him more samples and will increase his dose  to 1 mg weekly. FU in 1 month  Hemoglobin A1c  3. Essential hypertension- stable. May continue current medication. FU in 3 months.  - CMP14 + Anion Gap - CBC no Diff 4. Obesity- he is hoping as the weather gets warmer he will be able to be more active working and with the increase of the Ozempic, perhaps his weight will go down as well.   Lovelyn Sheeran RODRIGUEZ-SOUTHWORTH, PA-C    THE PATIENT IS ENCOURAGED TO PRACTICE SOCIAL DISTANCING DUE TO THE COVID-19 PANDEMIC.

## 2019-11-23 NOTE — Patient Instructions (Signed)
Increase the Ozempic to 1 mg per week starting next week. You will have to give yourself 2 shots the same day since we dont have samples of the 1 mg pen.

## 2019-11-24 LAB — CMP14 + ANION GAP
ALT: 40 IU/L (ref 0–44)
AST: 35 IU/L (ref 0–40)
Albumin/Globulin Ratio: 1.8 (ref 1.2–2.2)
Albumin: 4.9 g/dL (ref 3.8–4.9)
Alkaline Phosphatase: 104 IU/L (ref 39–117)
Anion Gap: 17 mmol/L (ref 10.0–18.0)
BUN/Creatinine Ratio: 11 (ref 9–20)
BUN: 12 mg/dL (ref 6–24)
Bilirubin Total: 0.5 mg/dL (ref 0.0–1.2)
CO2: 24 mmol/L (ref 20–29)
Calcium: 10.1 mg/dL (ref 8.7–10.2)
Chloride: 101 mmol/L (ref 96–106)
Creatinine, Ser: 1.1 mg/dL (ref 0.76–1.27)
GFR calc Af Amer: 89 mL/min/{1.73_m2} (ref >59)
GFR calc non Af Amer: 77 mL/min/{1.73_m2} (ref >59)
Globulin, Total: 2.8 g/dL (ref 1.5–4.5)
Glucose: 158 mg/dL — ABNORMAL HIGH (ref 65–99)
Potassium: 4.9 mmol/L (ref 3.5–5.2)
Sodium: 142 mmol/L (ref 134–144)
Total Protein: 7.7 g/dL (ref 6.0–8.5)

## 2019-11-24 LAB — CBC
Hematocrit: 45.1 % (ref 37.5–51.0)
Hemoglobin: 15.6 g/dL (ref 13.0–17.7)
MCH: 30.3 pg (ref 26.6–33.0)
MCHC: 34.6 g/dL (ref 31.5–35.7)
MCV: 88 fL (ref 79–97)
Platelets: 161 10*3/uL (ref 150–450)
RBC: 5.15 x10E6/uL (ref 4.14–5.80)
RDW: 12.4 % (ref 11.6–15.4)
WBC: 7.3 10*3/uL (ref 3.4–10.8)

## 2019-11-24 LAB — HEMOGLOBIN A1C
Est. average glucose Bld gHb Est-mCnc: 148 mg/dL
Hgb A1c MFr Bld: 6.8 % — ABNORMAL HIGH (ref 4.8–5.6)

## 2019-11-26 ENCOUNTER — Encounter: Payer: Self-pay | Admitting: Internal Medicine

## 2019-11-27 NOTE — Patient Instructions (Signed)
Visit Information  Goals Addressed            This Visit's Progress     Patient Stated   . I would like to optimize my medication management of my chronic conditions (pt-stated)       Current Barriers:  . Diabetes: T2DM; most recent A1c 6.8% on 11/23/19  . Current antihyperglycemic regimen: increase to Ozempic 1mg  weekly, metformin 1g BID o 11/23/19-Application completed with patient for [employer] patient assistance.  Awaiting patient financial documents.  Patient stated he would drop off bank statements.  Will submit paperwork when all documents received. . denies hypoglycemic symptoms; denies hyperglycemic symptoms . Current exercise: n/a . Current blood glucose readings: FBG 130-160s . Cardiovascular risk reduction: o Current hypertensive regimen: coreg, lisinopril o Current hyperlipidemia regimen: atorvastatin 20mg  daily  Pharmacist Clinical Goal(s):  Marland Kitchen Over the next 90 days, patient with work with PharmD and primary care provider to address needs related to optimized medication management of chronic conditions  Interventions: . Comprehensive medication review performed, medication list updated in electronic medical record . Comprehensive medication review performed.  Reviewed medication fill history via insurance claims data confirming patient appears compliant with having his medications filled on time as prescribed by provider. . Reviewed & discussed the following diabetes-related information with patient: o Continue checking blood sugars as directed o Follow ADA recommended "diabetes-friendly" diet  (reviewed healthy snack/food options) o Reviewed medication purpose/side effects-->patient denies adverse events o Continue taking all medications as prescribed by provider  Patient Self Care Activities:  . Patient will check blood glucose daily , document, and provide at future appointments . Patient will take medications as prescribed . Patient will contact provider with any  episodes of hypoglycemia . Patient will report any questions or concerns to provider   Please see past updates related to this goal by clicking on the "Past Updates" button in the selected goal         The patient verbalized understanding of instructions provided today and declined a print copy of patient instruction materials.   The care management team will reach out to the patient again over the next 30 days.   SIGNATURE Regina Eck, PharmD, BCPS Clinical Pharmacist, [facility] [place]: [phone]

## 2019-11-27 NOTE — Progress Notes (Signed)
Chronic Care Management   Visit Note  11/23/2019 Name: Pratheek Rideout MRN: [id number] DOB: 08-17-1966  Referred by: Shelby Mattocks, PA-C Reason for referral : Chronic Care Management and Diabetes   Keionte Pflanz is a 54 y.o. year old male who is a primary care patient of [place], Sunday Spillers, [place]. The CCM team was consulted for assistance with chronic disease management and care coordination needs related to DMII  Review of patient status, including review of consultants reports, relevant laboratory and other test results, and collaboration with appropriate care team members and the patient's provider was performed as part of comprehensive patient evaluation and provision of chronic care management services.    SDOH (Social Determinants of Health) assessments performed: No See Care Plan activities for detailed interventions related to SDOH)     Medications: Outpatient Encounter Medications as of 11/23/2019  Medication Sig  . aspirin EC 81 MG tablet Take 81 mg by mouth daily.  Marland Kitchen atorvastatin (LIPITOR) 40 MG tablet Take 1 tablet (40 mg total) by mouth at bedtime.  . carvedilol (COREG) 6.25 MG tablet Take 1 tablet (6.25 mg total) by mouth 2 (two) times daily with a meal.  . fenofibrate (TRICOR) 145 MG tablet Take 1 tablet (145 mg total) by mouth daily.  Marland Kitchen glucose blood (ACCU-CHEK GUIDE) test strip CHECK BLOOD SUGAR 3 TIMES A DAY BEFORE BREAKFAST, LUNCH, AND DINNER  . Icosapent Ethyl (VASCEPA) 1 g CAPS One bid for elevated triglycerides (Patient not taking: Reported on 10/04/2019)  . Insulin Pen Needle (PEN NEEDLES) 32G X 4 MM MISC Inject 1 each into the skin daily.  Marland Kitchen lisinopril (ZESTRIL) 20 MG tablet Take 1 tablet (20 mg total) by mouth daily.  . modafinil (PROVIGIL) 200 MG tablet Take 1 tablet (200 mg total) by mouth daily.  . Multiple Vitamins-Minerals (MULTIVITAMIN WITH MINERALS) tablet Take 1 tablet by mouth daily.  . Niacin, Antihyperlipidemic, (NIACIN ER,  ANTIHYPERLIPIDEMIC, PO) Take 2,000 mg by mouth at bedtime.   Marland Kitchen omeprazole (PRILOSEC) 10 MG capsule TAKE 1 CAPSULE BY MOUTH EVERY DAY BEFORE A MEAL  . OVER THE COUNTER MEDICATION Vitamin D 3 One capsule daily.  . Semaglutide,0.25 or 0.5MG /DOS, (OZEMPIC, 0.25 OR 0.5 MG/DOSE,) 2 MG/1.5ML SOPN Inject 1 mg into the skin once a week for 7 days, THEN 1 mg once a week for 7 days.  . tamsulosin (FLOMAX) 0.4 MG CAPS capsule TAKE 1 CAPSULE BY MOUTH ONCE DAILY 1/2 HOUR FOLLOWING THE SAME MEAL EACH DAY   No facility-administered encounter medications on file as of 11/23/2019.     Objective:   Goals Addressed            This Visit's Progress     Patient Stated   . I would like to optimize my medication management of my chronic conditions (pt-stated)       Current Barriers:  . Diabetes: T2DM; most recent A1c 6.8% on 11/23/19  . Current antihyperglycemic regimen: increase to Ozempic 1mg  weekly, metformin 1g BID o 11/23/19-Application completed with patient for [employer] patient assistance.  Awaiting patient financial documents.  Patient stated he would drop off bank statements.  Will submit paperwork when all documents received. . denies hypoglycemic symptoms; denies hyperglycemic symptoms . Current exercise: n/a . Current blood glucose readings: FBG 130-160s . Cardiovascular risk reduction: o Current hypertensive regimen: coreg, lisinopril o Current hyperlipidemia regimen: atorvastatin 20mg  daily  Pharmacist Clinical Goal(s):  Marland Kitchen Over the next 90 days, patient with work with PharmD and primary care provider to address needs related  to optimized medication management of chronic conditions  Interventions: . Comprehensive medication review performed, medication list updated in electronic medical record . Comprehensive medication review performed.  Reviewed medication fill history via insurance claims data confirming patient appears compliant with having his medications filled on time as prescribed by  provider. . Reviewed & discussed the following diabetes-related information with patient: o Continue checking blood sugars as directed o Follow ADA recommended "diabetes-friendly" diet  (reviewed healthy snack/food options) o Reviewed medication purpose/side effects-->patient denies adverse events o Continue taking all medications as prescribed by provider  Patient Self Care Activities:  . Patient will check blood glucose daily , document, and provide at future appointments . Patient will take medications as prescribed . Patient will contact provider with any episodes of hypoglycemia . Patient will report any questions or concerns to provider   Please see past updates related to this goal by clicking on the "Past Updates" button in the selected goal            Plan:   The care management team will reach out to the patient again over the next 60 days.   Provider Signature  Regina Eck, PharmD, BCPS Clinical Pharmacist, [facility] [place]: [phone]

## 2019-12-05 ENCOUNTER — Other Ambulatory Visit: Payer: Self-pay | Admitting: Nurse Practitioner

## 2019-12-05 ENCOUNTER — Ambulatory Visit
Admission: RE | Admit: 2019-12-05 | Discharge: 2019-12-05 | Disposition: A | Discharge status: Home / Self Care | Payer: Medicare PPO | Source: Ambulatory Visit | Attending: Internal Medicine | Admitting: Internal Medicine

## 2019-12-05 ENCOUNTER — Ambulatory Visit
Admission: RE | Admit: 2019-12-05 | Discharge: 2019-12-05 | Disposition: A | Discharge status: Home / Self Care | Payer: Medicare PPO | Source: Ambulatory Visit | Attending: Nurse Practitioner | Admitting: Nurse Practitioner

## 2019-12-05 ENCOUNTER — Other Ambulatory Visit: Payer: Self-pay

## 2019-12-05 DIAGNOSIS — R0781 Pleurodynia: Secondary | ICD-10-CM

## 2019-12-05 DIAGNOSIS — I10 Essential (primary) hypertension: Secondary | ICD-10-CM

## 2019-12-13 ENCOUNTER — Other Ambulatory Visit: Payer: Self-pay | Admitting: Pharmacy Technician

## 2019-12-13 NOTE — Patient Outreach (Signed)
[place]  12/13/2019  Amarre Sybrant 1966-05-15 [id number]   Received patient and provider portion(s) of patient assistance application(s) for Ozempic. Faxed completed application and required documents into [employer].  Will follow up with company(ies) in 10-14 business days to check status of application(s).  Maud Deed Chana Bode, [place] Certified Pharmacy Technician [employer]

## 2019-12-19 ENCOUNTER — Encounter: Payer: Self-pay | Admitting: Internal Medicine

## 2019-12-19 ENCOUNTER — Other Ambulatory Visit: Payer: Self-pay

## 2019-12-19 DIAGNOSIS — E1140 Type 2 diabetes mellitus with diabetic neuropathy, unspecified: Secondary | ICD-10-CM

## 2019-12-19 MED ORDER — ACCU-CHEK GUIDE VI STRP: ORAL_STRIP | 1 refills | Status: DC

## 2019-12-25 ENCOUNTER — Other Ambulatory Visit: Payer: Self-pay

## 2019-12-25 MED ORDER — ATORVASTATIN CALCIUM 40 MG PO TABS: 40.0000 mg | ORAL_TABLET | Freq: Every day | ORAL | 1 refills | Status: DC

## 2019-12-28 ENCOUNTER — Other Ambulatory Visit: Payer: Self-pay | Admitting: Pharmacy Technician

## 2019-12-28 NOTE — Patient Outreach (Signed)
[place] [facility]) Care Management  12/28/2019  Jaethan Duel November 13, 1965 [id number]    Follow up call placed to [employer] regarding patient assistance application(s) for Ozempic , Lovey Newcomer confirms patient has been approved as of 3/25 until 08/20/2020. Medication to arrive providers office in 10-14 business days from approval date.  Follow up:  Sent message [facility] staffmember to inform, will remove myself from care team.  Maud Deed. Chana Bode, [place] Certified Pharmacy Technician [employer]

## 2020-01-08 ENCOUNTER — Encounter: Payer: Self-pay | Admitting: Internal Medicine

## 2020-01-14 ENCOUNTER — Encounter: Payer: Self-pay | Admitting: Internal Medicine

## 2020-01-16 ENCOUNTER — Other Ambulatory Visit: Payer: Self-pay

## 2020-01-16 ENCOUNTER — Encounter: Payer: Self-pay | Admitting: Internal Medicine

## 2020-01-16 MED ORDER — FENOFIBRATE 145 MG PO TABS: 145.0000 mg | ORAL_TABLET | Freq: Every day | ORAL | 1 refills | Status: DC

## 2020-02-06 ENCOUNTER — Other Ambulatory Visit: Payer: Self-pay

## 2020-02-06 DIAGNOSIS — E1140 Type 2 diabetes mellitus with diabetic neuropathy, unspecified: Secondary | ICD-10-CM

## 2020-02-06 MED ORDER — ACCU-CHEK SOFTCLIX LANCETS MISC: 3 refills | Status: DC

## 2020-02-06 MED ORDER — ACCU-CHEK GUIDE VI STRP: ORAL_STRIP | 1 refills | Status: DC

## 2020-02-06 MED ORDER — ACCU-CHEK GUIDE W/DEVICE KIT: PACK | 0 refills | Status: DC

## 2020-02-15 ENCOUNTER — Other Ambulatory Visit: Payer: Self-pay

## 2020-02-15 MED ORDER — LISINOPRIL 20 MG PO TABS: 20.0000 mg | ORAL_TABLET | Freq: Every day | ORAL | 1 refills | Status: DC

## 2020-02-20 ENCOUNTER — Encounter: Payer: Self-pay | Admitting: Internal Medicine

## 2020-02-21 ENCOUNTER — Other Ambulatory Visit: Payer: Self-pay

## 2020-02-21 MED ORDER — OMEPRAZOLE 10 MG PO CPDR: DELAYED_RELEASE_CAPSULE | ORAL | 1 refills | Status: DC

## 2020-02-21 MED ORDER — CARVEDILOL 6.25 MG PO TABS: 6.2500 mg | ORAL_TABLET | Freq: Two times a day (BID) | ORAL | 1 refills | Status: DC

## 2020-02-22 ENCOUNTER — Ambulatory Visit (INDEPENDENT_AMBULATORY_CARE_PROVIDER_SITE_OTHER): Payer: Medicare PPO | Admitting: Internal Medicine

## 2020-02-22 ENCOUNTER — Other Ambulatory Visit: Payer: Self-pay

## 2020-02-22 VITALS — BP 126/76 | HR 76 | Temp 98.2°F | Ht 67.0 in | Wt 238.6 lb

## 2020-02-22 DIAGNOSIS — G4710 Hypersomnia, unspecified: Secondary | ICD-10-CM | POA: Diagnosis not present

## 2020-02-22 DIAGNOSIS — H9193 Unspecified hearing loss, bilateral: Secondary | ICD-10-CM

## 2020-02-22 DIAGNOSIS — E1140 Type 2 diabetes mellitus with diabetic neuropathy, unspecified: Secondary | ICD-10-CM

## 2020-02-22 DIAGNOSIS — I10 Essential (primary) hypertension: Secondary | ICD-10-CM | POA: Diagnosis not present

## 2020-02-22 DIAGNOSIS — L239 Allergic contact dermatitis, unspecified cause: Secondary | ICD-10-CM | POA: Diagnosis not present

## 2020-02-22 DIAGNOSIS — K5903 Drug induced constipation: Secondary | ICD-10-CM

## 2020-02-22 LAB — HEMOGLOBIN A1C
Est. average glucose Bld gHb Est-mCnc: 171 mg/dL
Hgb A1c MFr Bld: 7.6 % — ABNORMAL HIGH (ref 4.8–5.6)

## 2020-02-22 LAB — CBC
Hematocrit: 44 % (ref 37.5–51.0)
Hemoglobin: 15.1 g/dL (ref 13.0–17.7)
MCH: 30.4 pg (ref 26.6–33.0)
MCHC: 34.3 g/dL (ref 31.5–35.7)
MCV: 89 fL (ref 79–97)
Platelets: 158 10*3/uL (ref 150–450)
RBC: 4.97 x10E6/uL (ref 4.14–5.80)
RDW: 12.5 % (ref 11.6–15.4)
WBC: 6.1 10*3/uL (ref 3.4–10.8)

## 2020-02-22 MED ORDER — PREDNISONE 20 MG PO TABS: 20.0000 mg | ORAL_TABLET | Freq: Every day | ORAL | 0 refills | Status: DC

## 2020-02-22 NOTE — Patient Instructions (Signed)
Get TECNU wash and wash your skin with this every day to prevent rash when you are working outside and the plants and branches are touching your skin. You may use it now while you have the rash as well to help clear up any oils you have on your skin causing the rash.    Contact Dermatitis Dermatitis is redness, soreness, and swelling (inflammation) of the skin. Contact dermatitis is a reaction to something that touches the skin. There are two types of contact dermatitis:  Irritant contact dermatitis. This happens when something bothers (irritates) your skin, like soap.  Allergic contact dermatitis. This is caused when you are exposed to something that you are allergic to, such as poison ivy. What are the causes?  Common causes of irritant contact dermatitis include: ? Makeup. ? Soaps. ? Detergents. ? Bleaches. ? Acids. ? Metals, such as nickel.  Common causes of allergic contact dermatitis include: ? Plants. ? Chemicals. ? Jewelry. ? Latex. ? Medicines. ? Preservatives in products, such as clothing. What increases the risk?  Having a job that exposes you to things that bother your skin.  Having asthma or eczema. What are the signs or symptoms? Symptoms may happen anywhere the irritant has touched your skin. Symptoms include:  Dry or flaky skin.  Redness.  Cracks.  Itching.  Pain or a burning feeling.  Blisters.  Blood or clear fluid draining from skin cracks. With allergic contact dermatitis, swelling may occur. This may happen in places such as the eyelids, mouth, or genitals. How is this treated?  This condition is treated by checking for the cause of the reaction and protecting your skin. Treatment may also include: ? Steroid creams, ointments, or medicines. ? Antibiotic medicines or other ointments, if you have a skin infection. ? Lotion or medicines to help with itching. ? A bandage (dressing). Follow these instructions at home: Skin care  Moisturize  your skin as needed.  Put cool cloths on your skin.  Put a baking soda paste on your skin. Stir water into baking soda until it looks like a paste.  Do not scratch your skin.  Avoid having things rub up against your skin.  Avoid the use of soaps, perfumes, and dyes. Medicines  Take or apply over-the-counter and prescription medicines only as told by your doctor.  If you were prescribed an antibiotic medicine, take or apply it as told by your doctor. Do not stop using it even if your condition starts to get better. Bathing  Take a bath with: ? Epsom salts. ? Baking soda. ? Colloidal oatmeal.  Bathe less often.  Bathe in warm water. Avoid using hot water. Bandage care  If you were given a bandage, change it as told by your health care provider.  Wash your hands with soap and water before and after you change your bandage. If soap and water are not available, use hand sanitizer. General instructions  Avoid the things that caused your reaction. If you do not know what caused it, keep a journal. Write down: ? What you eat. ? What skin products you use. ? What you drink. ? What you wear in the area that has symptoms. This includes jewelry.  Check the affected areas every day for signs of infection. Check for: ? More redness, swelling, or pain. ? More fluid or blood. ? Warmth. ? Pus or a bad smell.  Keep all follow-up visits as told by your doctor. This is important. Contact a doctor if:  You do  not get better with treatment.  Your condition gets worse.  You have signs of infection, such as: ? More swelling. ? Tenderness. ? More redness. ? Soreness. ? Warmth.  You have a fever.  You have new symptoms. Get help right away if:  You have a very bad headache.  You have neck pain.  Your neck is stiff.  You throw up (vomit).  You feel very sleepy.  You see red streaks coming from the area.  Your bone or joint near the area hurts after the skin has  healed.  The area turns darker.  You have trouble breathing. Summary  Dermatitis is redness, soreness, and swelling of the skin.  Symptoms may occur where the irritant has touched you.  Treatment may include medicines and skin care.  If you do not know what caused your reaction, keep a journal.  Contact a doctor if your condition gets worse or you have signs of infection. This information is not intended to replace advice given to you by your health care provider. Make sure you discuss any questions you have with your health care provider. Document Revised: 12/28/2018 Document Reviewed: 03/23/2018 Elsevier Patient Education  [place].

## 2020-02-22 NOTE — Progress Notes (Signed)
This visit occurred during the SARS-CoV-2 public health emergency.  Safety protocols were in place, including screening questions prior to the visit, additional usage of staff PPE, and extensive cleaning of exam room while observing appropriate contact time as indicated for disinfecting solutions.  Subjective:     Patient ID: Stephen Chase , male    DOB: November 17, 1965 , 54 y.o.   MRN: [id number]   Chief Complaint  Patient presents with  . 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 Has been using Calamine topical for itching. He gets this rash all the time.  2- DM + constipation with Ozempic. But citrucell type drink helps.      Average post prandial glucose has been 200's, fasting 150's.  Covid test J&J 4/8      Past Medical History:  Diagnosis Date  . Allergy   . Arthritis    SHOULDERS  . Balanitis   . Deafness, sensorineural, childhood onset    BILATERAL SINCE AGE 80  SECONDARY TO UNKNOWN ILLNESS  . GERD (gastroesophageal reflux disease)   . Hyperlipidemia   . Hypertension   . No blood products    Patient is Jehovah Witness no blood products.  . OSA on CPAP    SEVERE PER STUDY 03-09-2009  . Sleep apnea    uses a cpap  . Smokers' cough ([place])   . Type 2 diabetes mellitus (HCC)      Family History  Problem Relation Age of Onset  . Cancer Mother        type unknown pt was 54 years old at that time  . Liver disease Father   . Healthy Brother   . Cancer Other         alot of family members have cancer but doesnt know the type  . Colon cancer Neg Hx   . Esophageal cancer Neg Hx   . Rectal cancer Neg Hx   . Stomach cancer Neg Hx      Current Outpatient Medications:  .  Accu-Chek Softclix Lancets lancets, Use as instructed, Disp: 200 each, Rfl: 3 .  aspirin EC 81 MG tablet, Take 81 mg by mouth daily., Disp: , Rfl:  .  atorvastatin (LIPITOR) 40 MG  tablet, Take 1 tablet (40 mg total) by mouth at bedtime., Disp: 90 tablet, Rfl: 1 .  Blood Glucose Monitoring Suppl (ACCU-CHEK GUIDE) w/Device KIT, Use to check blood sugars three times daily E11.9, Disp: 1 kit, Rfl: 0 .  carvedilol (COREG) 6.25 MG tablet, Take 1 tablet (6.25 mg total) by mouth 2 (two) times daily with a meal., Disp: 180 tablet, Rfl: 1 .  fenofibrate (TRICOR) 145 MG tablet, Take 1 tablet (145 mg total) by mouth daily., Disp: 90 tablet, Rfl: 1 .  glucose blood (ACCU-CHEK GUIDE) test strip, CHECK BLOOD SUGAR 3 TIMES A DAY BEFORE BREAKFAST, LUNCH, AND DINNER, Disp: 150 strip, Rfl: 1 .  Insulin Pen Needle (PEN NEEDLES) 32G X 4 MM MISC, Inject 1 each into the skin daily., Disp: 30 each, Rfl: 2 .  lisinopril (ZESTRIL) 20 MG tablet, Take 1 tablet (20 mg total) by mouth daily., Disp: 90 tablet, Rfl: 1 .  modafinil (PROVIGIL) 200 MG tablet, Take 1 tablet (200 mg total) by mouth daily., Disp: 30 tablet, Rfl: 5 .  Multiple Vitamins-Minerals (MULTIVITAMIN WITH MINERALS) tablet, Take 1 tablet by  mouth daily., Disp: , Rfl:  .  Niacin, Antihyperlipidemic, (NIACIN ER, ANTIHYPERLIPIDEMIC, PO), Take 2,000 mg by mouth at bedtime. , Disp: , Rfl:  .  omeprazole (PRILOSEC) 10 MG capsule, TAKE 1 CAPSULE BY MOUTH EVERY DAY BEFORE A MEAL, Disp: 90 capsule, Rfl: 1 .  OVER THE COUNTER MEDICATION, Vitamin D 3 One capsule daily., Disp: , Rfl:  .  tamsulosin (FLOMAX) 0.4 MG CAPS capsule, TAKE 1 CAPSULE BY MOUTH ONCE DAILY 1/2 HOUR FOLLOWING THE SAME MEAL EACH DAY, Disp: 90 capsule, Rfl: 1 .  Icosapent Ethyl (VASCEPA) 1 g CAPS, One bid for elevated triglycerides (Patient not taking: Reported on 10/04/2019), Disp: 180 capsule, Rfl: 0   No Known Allergies   Review of Systems  Review of Systems  Constitutional: Negative for diaphoresis and unexpected weight change.  HENT: Negative for tinnitus.   Eyes: Negative for visual disturbance.  Respiratory: Negative for chest tightness and shortness of breath.    Cardiovascular: Negative for chest pain, palpitations and leg swelling.  Gastrointestinal: Negative for constipation, diarrhea and nausea.  Endocrine: Negative for polydipsia, polyphagia and polyuria.  Genitourinary: Negative for dysuria and frequency.  Skin: Negative for rash and wound.  Neurological: Negative for dizziness, speech difficulty, weakness, numbness and headaches.  Today's Vitals   02/22/20 0917  BP: 126/76  Pulse: 76  Temp: 98.2 F (36.8 C)  TempSrc: Oral  SpO2: 97%  Weight: 238 lb 9.6 oz (108.2 kg)  Height: '5\' 7"'$  (1.702 m)  PainSc: 0-No pain   Body mass index is 37.37 kg/m.   Objective:  Physical Exam  Constitutional: he is oriented to person, place, and time. He appears well-developed and well-nourished. No distress.  HENT:  Head: Normocephalic and atraumatic.  Right Ear: External ear normal.  Left Ear: External ear normal.  Nose: Nose normal.  Eyes: Conjunctivae are normal. Right eye exhibits no discharge. Left eye exhibits no discharge. No scleral icterus.  Neck: Neck supple. No thyromegaly present.  No carotid bruits bilaterally  Cardiovascular: Normal rate and regular rhythm.  No murmur heard. GI- + constipation since Ozempic increased Pulmonary/Chest: Effort normal and breath sounds normal. No respiratory distress.  Musculoskeletal: Normal range of motion. He exhibits no edema.  Lymphadenopathy:  He has no cervical adenopathy.  Neurological: he is alert and oriented to person, place, and time.  Skin: Skin is warm and dry.  He is not diaphoretic. Has some linear and cluster of vesicles on his posterior neck, R upper back L and R forearm. They are not painful.   Psychiatric: He has a normal mood and affect. His behavior is normal. Judgment and thought content normal.  Nursing note reviewed.  Assessment And Plan:     1. Type 2 diabetes mellitus with diabetic neuropathy, without long-term current use of insulin ([place])- seems under poor control. Advised  to avoid sweets and too much bread and when he comes back in 3 months to see Janece, to bring food diary for prior month and glucose readings.  - Hemoglobin A1c  2. Essential hypertension- stable. May continue same meds.  - CBC no Diff  3. Bilateral deafness- chronic, unchanged. No action.   4. Allergic contact dermatitis, unspecified trigger- I placed him on Prednisone 20 mg qd x 5 days since it is mild. Advised to use TECNU  6. Constipation due to pain medication therapy- secondary to Ozempic. Advised to continue the [place] and was educated that using this is not habit forming and this is the normal side effect of Ozempic.  Chrystle Murillo RODRIGUEZ-SOUTHWORTH, PA-C    THE PATIENT IS ENCOURAGED TO PRACTICE SOCIAL DISTANCING DUE TO THE COVID-19 PANDEMIC.

## 2020-02-26 ENCOUNTER — Telehealth: Payer: Self-pay

## 2020-02-26 NOTE — Telephone Encounter (Signed)
Left vm for pt to return call to let me know if he looked at lab results on mychart

## 2020-02-29 DIAGNOSIS — G4733 Obstructive sleep apnea (adult) (pediatric): Secondary | ICD-10-CM | POA: Diagnosis not present

## 2020-03-05 ENCOUNTER — Other Ambulatory Visit: Payer: Self-pay

## 2020-03-05 MED ORDER — TAMSULOSIN HCL 0.4 MG PO CAPS: ORAL_CAPSULE | ORAL | 1 refills | Status: DC

## 2020-03-14 ENCOUNTER — Encounter: Payer: Medicare PPO | Admitting: Internal Medicine

## 2020-03-14 ENCOUNTER — Ambulatory Visit: Payer: Medicare PPO

## 2020-03-21 ENCOUNTER — Telehealth: Payer: Self-pay | Admitting: Nurse Practitioner

## 2020-03-21 NOTE — Chronic Care Management (AMB) (Signed)
  Chronic Care Management   Note  03/21/2020 Name: Stephen Chase MRN: [id number] DOB: 1966-06-16  Stephen Chase is a 54 y.o. year old male who is a primary care patient of [name], [place] and is actively engaged with the care management team. I reached out to [name] by phone today to assist with scheduling an initial visit with the Pharmacist.  Follow up plan: Unsuccessful telephone outreach attempt made. The care management team will reach out to the patient again over the next 7 days. If patient returns call to provider office, please advise to call [place] at [phone].  [place], [place] [address] Direct Dial: [phone] [email or web address]: [email or web address]

## 2020-03-26 NOTE — Chronic Care Management (AMB) (Signed)
°  Chronic Care Management   Note  03/26/2020 Name: Stephen Chase MRN: [id number] DOB: 09/03/1966  Stephen Chase is a 54 y.o. year old male who is a primary care patient of [name], [place] and is actively engaged with the care management team. I reached out to [name] by phone today to assist with scheduling an initial visit with the Pharmacist  Follow up plan: Unsuccessful telephone outreach attempt made. A HIPPA compliant phone message was left for the patient providing contact information and requesting a return call. The care management team will reach out to the patient again over the next 7 days. If patient returns call to provider office, please advise to call [place] at [phone].  [place], [place] [address] Direct Dial: [phone] [email or web address] Website: [email or web address]

## 2020-03-27 NOTE — Chronic Care Management (AMB) (Signed)
  Chronic Care Management   Note  03/27/2020 Name: Stephen Chase MRN: [id number] DOB: [birth year]  Stephen Chase is a 54 y.o. year old male who is a primary care patient of [name], [place] and is actively engaged with the care management team. I reached out to [name] by phone today to assist with scheduling an initial visit with the Pharmacist.  [name] was given information about Chronic Care Management services today including:  1. CCM service includes personalized support from designated clinical staff supervised by his physician, including individualized plan of care and coordination with other care providers 2. 24/7 contact phone numbers for assistance for urgent and routine care needs. 3. Service will only be billed when office clinical staff spend 20 minutes or more in a month to coordinate care. 4. Only one practitioner may furnish and bill the service in a calendar month. 5. The patient may stop CCM services at any time (effective at the end of the month) by phone call to the office staff. 6. The patient will be responsible for cost sharing (co-pay) of up to 20% of the service fee (after annual deductible is met).  Patient agreed to services and verbal consent obtained.    Follow up plan: Telephone appointment with care management team member scheduled for: 04/29/2020.  [place], [place] [address] Direct Dial: [phone] [email or web address] Website: [email or web address]

## 2020-03-28 ENCOUNTER — Encounter: Payer: Medicare PPO | Admitting: Internal Medicine

## 2020-03-28 ENCOUNTER — Ambulatory Visit: Payer: Medicare PPO

## 2020-04-08 ENCOUNTER — Encounter: Payer: Self-pay | Admitting: Nurse Practitioner

## 2020-04-09 NOTE — Progress Notes (Signed)
This visit occurred during the SARS-CoV-2 public health emergency.  Safety protocols were in place, including screening questions prior to the visit, additional usage of staff PPE, and extensive cleaning of exam room while observing appropriate contact time as indicated for disinfecting solutions.  Subjective:     Patient ID: Stephen Chase , male    DOB: 1966-02-25 , 54 y.o.   MRN: [id number]   Chief Complaint  Patient presents with  . Annual Exam    HPI  Here for HM with the interpreter.  Wt Readings from Last 3 Encounters: 04/10/20 : 231 lb 12.8 oz (105.1 kg) 02/22/20 : 238 lb 9.6 oz (108.2 kg) 11/23/19 : 238 lb 6.4 oz (108.1 kg)  He had an injury to his toe after having lumber to fall on the toe in 1998 where the nail did not grow back well, has to always trim.  And has continued pain.   Diabetes He presents for his follow-up diabetic visit. He has type 2 diabetes mellitus. His disease course has been stable. Hypoglycemia symptoms include headaches. Pertinent negatives for hypoglycemia include no dizziness. There are no diabetic associated symptoms. Pertinent negatives for diabetes include no polydipsia, no polyphagia and no polyuria. There are no hypoglycemic complications. There are no diabetic complications. Risk factors for coronary artery disease include diabetes mellitus, obesity, sedentary lifestyle, male sex, dyslipidemia and hypertension. Current diabetic treatment includes oral agent (monotherapy). He is compliant with treatment most of the time. When asked about meal planning, he reported none. He has not had a previous visit with a dietitian. (Average 170 blood sugar, 114- 232 on his glucometer) An ACE inhibitor/angiotensin II receptor blocker is being taken. Eye exam is current.  Hypertension This is a chronic problem. The current episode started more than 1 year ago. The problem is unchanged. The problem is controlled. Associated symptoms include headaches. Pertinent  negatives include no anxiety. There are no associated agents to hypertension. Risk factors for coronary artery disease include diabetes mellitus, male gender, obesity and sedentary lifestyle. Past treatments include ACE inhibitors. There are no compliance problems.  There is no history of angina.  Headache  This is a recurrent problem. The current episode started more than 1 month ago. The problem occurs intermittently. Pain location: temporal area. The pain does not radiate. The pain quality is not similar to prior headaches. The quality of the pain is described as aching. Pain scale: states "not super bad"  Pertinent negatives include no abdominal pain, dizziness or photophobia. Nothing aggravates the symptoms. He has tried acetaminophen (BC powder ) for the symptoms. His past medical history is significant for hypertension.     Past Medical History:  Diagnosis Date  . Allergy   . Arthritis    SHOULDERS  . Balanitis   . Deafness, sensorineural, childhood onset    BILATERAL SINCE AGE 89  SECONDARY TO UNKNOWN ILLNESS  . GERD (gastroesophageal reflux disease)   . Hyperlipidemia   . Hypertension   . No blood products    Patient is Jehovah Witness no blood products.  . OSA on CPAP    SEVERE PER STUDY 03-09-2009  . Sleep apnea    uses a cpap  . Smokers' cough ([place])   . Type 2 diabetes mellitus (HCC)      Family History  Problem Relation Age of Onset  . Cancer Mother        type unknown pt was 2 years old at that time  . Liver disease Father   . Healthy  Brother   . Cancer Other         alot of family members have cancer but doesnt know the type  . Colon cancer Neg Hx   . Esophageal cancer Neg Hx   . Rectal cancer Neg Hx   . Stomach cancer Neg Hx      Current Outpatient Medications:  .  Accu-Chek Softclix Lancets lancets, Use as instructed, Disp: 200 each, Rfl: 3 .  aspirin EC 81 MG tablet, Take 81 mg by mouth daily., Disp: , Rfl:  .  atorvastatin (LIPITOR) 40 MG tablet, Take 1  tablet (40 mg total) by mouth at bedtime., Disp: 90 tablet, Rfl: 1 .  Blood Glucose Monitoring Suppl (ACCU-CHEK GUIDE) w/Device KIT, Use to check blood sugars three times daily E11.9, Disp: 1 kit, Rfl: 0 .  carvedilol (COREG) 6.25 MG tablet, Take 1 tablet (6.25 mg total) by mouth 2 (two) times daily with a meal., Disp: 180 tablet, Rfl: 1 .  fenofibrate (TRICOR) 145 MG tablet, Take 1 tablet (145 mg total) by mouth daily., Disp: 90 tablet, Rfl: 1 .  glucose blood (ACCU-CHEK GUIDE) test strip, CHECK BLOOD SUGAR 3 TIMES A DAY BEFORE BREAKFAST, LUNCH, AND DINNER, Disp: 150 strip, Rfl: 1 .  Insulin Pen Needle (PEN NEEDLES) 32G X 4 MM MISC, Inject 1 each into the skin daily., Disp: 30 each, Rfl: 2 .  lisinopril (ZESTRIL) 20 MG tablet, Take 1 tablet (20 mg total) by mouth daily., Disp: 90 tablet, Rfl: 1 .  Magnesium 250 MG TABS, Take 1 tablet (250 mg total) by mouth every evening. With evening meals, Disp: 30 tablet, Rfl: 2 .  modafinil (PROVIGIL) 200 MG tablet, Take 1 tablet (200 mg total) by mouth daily., Disp: 30 tablet, Rfl: 5 .  Multiple Vitamins-Minerals (MULTIVITAMIN WITH MINERALS) tablet, Take 1 tablet by mouth daily., Disp: , Rfl:  .  Niacin, Antihyperlipidemic, (NIACIN ER, ANTIHYPERLIPIDEMIC, PO), Take 500 mg by mouth at bedtime. , Disp: , Rfl:  .  olmesartan-hydrochlorothiazide (BENICAR HCT) 40-25 MG tablet, Take 1 tablet by mouth daily., Disp: , Rfl:  .  Omega-3 Fatty Acids (FISH OIL) 1000 MG CPDR, Take by mouth. Take one tablet daily, Disp: , Rfl:  .  omeprazole (PRILOSEC) 10 MG capsule, TAKE 1 CAPSULE BY MOUTH EVERY DAY BEFORE A MEAL, Disp: 90 capsule, Rfl: 1 .  OVER THE COUNTER MEDICATION, Vitamin D 3 One capsule daily., Disp: , Rfl:  .  Semaglutide, 1 MG/DOSE, (OZEMPIC, 1 MG/DOSE,) 2 MG/1.5ML SOPN, Inject 1 mg into the skin once a week. Inject 69m into the skin daily, Disp: , Rfl:  .  tamsulosin (FLOMAX) 0.4 MG CAPS capsule, TAKE 1 CAPSULE BY MOUTH ONCE DAILY 1/2 HOUR FOLLOWING THE SAME MEAL  EACH DAY, Disp: 90 capsule, Rfl: 1 .  Icosapent Ethyl (VASCEPA) 1 g CAPS, One bid for elevated triglycerides (Patient not taking: Reported on 10/04/2019), Disp: 180 capsule, Rfl: 0   No Known Allergies   Men's preventive visit. Patient Health Questionnaire (PHQ-2) is    Clinical Support from 04/10/2020 in [facility]  PHQ-2 Total Score 0     Patient is on a regular diet. I am not eating as much.  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  Marital status: Married. Relevant history for alcohol use is:  Social History   Substance and Sexual Activity  Alcohol Use Not Currently   Comment: rarely  . Relevant history for tobacco  use is:  Social History   Tobacco Use  Smoking Status Former Smoker  . Packs/day: 1.00  . Years: 30.00  . Pack years: 30.00  . Types: Cigarettes  Smokeless Tobacco Never Used  Tobacco Comment   quit 4 years ago  .   Review of Systems  Constitutional: Negative.   HENT: Negative.   Eyes: Negative.  Negative for photophobia.  Respiratory: Negative.   Cardiovascular: Negative.   Gastrointestinal: Negative.  Negative for abdominal pain.  Endocrine: Negative.  Negative for polydipsia, polyphagia and polyuria.  Genitourinary: Negative.   Musculoskeletal: Negative.   Allergic/Immunologic: Negative.   Neurological: Positive for headaches. Negative for dizziness.  Hematological: Negative.   Psychiatric/Behavioral: Negative.      Today's Vitals   04/10/20 1016  BP: 122/74  Pulse: 73  Temp: 98.1 F (36.7 C)  TempSrc: Oral  Weight: 231 lb 12.8 oz (105.1 kg)  Height: _0  (1.702 m)  PainSc: 0-No pain   Body mass index is 36.31 kg/m.   Objective:  Physical Exam      Assessment And Plan:    1. Encounter for general adult medical examination w/o abnormal findings . Behavior modifications discussed and diet history reviewed.   . Pt will continue to exercise  regularly and modify diet with low GI, plant based foods and decrease intake of processed foods.  . Recommend intake of daily multivitamin, Vitamin D, and calcium.  . Recommend colonoscopy (Up to date) for preventive screenings, as well as recommend immunizations that include influenza, TDAP  2. Type 2 diabetes mellitus with diabetic neuropathy, without long-term current use of insulin (HCC)  Chronic, controlled  Continue with current medications  Encouraged to limit intake of sugary foods and drinks  Encouraged to continue physical activity at work but try to add some walking as well  - CMP14+EGFR - Magnesium 250 MG TABS; Take 1 tablet (250 mg total) by mouth every evening. With evening meals  Dispense: 30 tablet; Refill: 2  3. Essential hypertension . B/P is controlled.  . CMP ordered to check renal function.  . The importance of regular exercise and dietary modification was stressed to the patient.  . EKG done with NSR HR 68 - POCT Urinalysis Dipstick (81002) - POCT UA - Microalbumin - EKG 12-Lead - CMP14+EGFR  4. Encounter for prostate cancer screening  - PSA  5. Mixed hyperlipidemia  CHRONIC Controlled Continue with current medications. Will check lipid panel.   - Lipid panel  6. OSA on CPAP  Chronic, doing well with his CPAP. Feels he has a better quality of life when wearing his CPAP.   7. 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   He is also encouraged to take magnesium with evening meals. - Magnesium 250 MG TABS; Take 1 tablet (250 mg total) by mouth every evening. With evening meals  Dispense: 30 tablet; Refill: 2  8. Pain in toes of both feet  Will refer to podiatry for further evaluation this is a long term problem  9. Muscle cramps  Stay well hydrated and take magnesium daily - Magnesium     Patient was given  opportunity to ask questions. Patient verbalized understanding of the plan and was able to repeat key elements of the plan. All questions were answered to their satisfaction.   [name], FNP   I, [name], FNP, have reviewed all documentation for this visit.  The documentation on 04/14/20 for the exam, diagnosis, procedures, and orders are all accurate and complete.  THE PATIENT IS ENCOURAGED TO PRACTICE SOCIAL DISTANCING DUE TO THE COVID-19 PANDEMIC.

## 2020-04-10 ENCOUNTER — Ambulatory Visit (INDEPENDENT_AMBULATORY_CARE_PROVIDER_SITE_OTHER): Payer: Medicare PPO | Admitting: Nurse Practitioner

## 2020-04-10 ENCOUNTER — Other Ambulatory Visit: Payer: Self-pay

## 2020-04-10 ENCOUNTER — Encounter: Payer: Self-pay | Admitting: Nurse Practitioner

## 2020-04-10 ENCOUNTER — Ambulatory Visit (INDEPENDENT_AMBULATORY_CARE_PROVIDER_SITE_OTHER): Payer: Medicare PPO

## 2020-04-10 VITALS — BP 122/74 | HR 73 | Temp 98.1°F | Ht 67.0 in | Wt 231.8 lb

## 2020-04-10 VITALS — BP 122/74 | HR 73 | Temp 98.1°F | Ht 67.0 in | Wt 231.0 lb

## 2020-04-10 DIAGNOSIS — M79674 Pain in right toe(s): Secondary | ICD-10-CM

## 2020-04-10 DIAGNOSIS — M79675 Pain in left toe(s): Secondary | ICD-10-CM

## 2020-04-10 DIAGNOSIS — Z23 Encounter for immunization: Secondary | ICD-10-CM

## 2020-04-10 DIAGNOSIS — E782 Mixed hyperlipidemia: Secondary | ICD-10-CM

## 2020-04-10 DIAGNOSIS — R252 Cramp and spasm: Secondary | ICD-10-CM | POA: Diagnosis not present

## 2020-04-10 DIAGNOSIS — Z125 Encounter for screening for malignant neoplasm of prostate: Secondary | ICD-10-CM

## 2020-04-10 DIAGNOSIS — E1140 Type 2 diabetes mellitus with diabetic neuropathy, unspecified: Secondary | ICD-10-CM

## 2020-04-10 DIAGNOSIS — I10 Essential (primary) hypertension: Secondary | ICD-10-CM

## 2020-04-10 DIAGNOSIS — R519 Headache, unspecified: Secondary | ICD-10-CM

## 2020-04-10 DIAGNOSIS — Z0000 Encounter for general adult medical examination without abnormal findings: Secondary | ICD-10-CM

## 2020-04-10 DIAGNOSIS — Z9989 Dependence on other enabling machines and devices: Secondary | ICD-10-CM

## 2020-04-10 DIAGNOSIS — G4733 Obstructive sleep apnea (adult) (pediatric): Secondary | ICD-10-CM | POA: Diagnosis not present

## 2020-04-10 LAB — POCT URINALYSIS DIPSTICK
Bilirubin, UA: NEGATIVE
Blood, UA: NEGATIVE
Glucose, UA: NEGATIVE
Ketones, UA: NEGATIVE
Leukocytes, UA: NEGATIVE
Nitrite, UA: NEGATIVE
Protein, UA: NEGATIVE
Spec Grav, UA: >=1.03 — AB (ref 1.010–1.025)
Urobilinogen, UA: 0.2 E.U./dL
pH, UA: 5.5 (ref 5.0–8.0)

## 2020-04-10 LAB — POCT UA - MICROALBUMIN
Albumin/Creatinine Ratio, Urine, POC: <30
Creatinine, POC: 300 mg/dL
Microalbumin Ur, POC: 80 mg/L

## 2020-04-10 MED ORDER — MAGNESIUM 250 MG PO TABS: 1.0000 | ORAL_TABLET | Freq: Every evening | ORAL | 2 refills | Status: DC

## 2020-04-10 MED ORDER — TETANUS-DIPHTHERIA TOXOIDS TD 2-2 LF/0.5ML IM SUSP: 0.5000 mL | Freq: Once | INTRAMUSCULAR | 0 refills | Status: AC

## 2020-04-10 NOTE — Patient Instructions (Signed)
[name] , Thank you for taking time to come for your Medicare Wellness Visit. I appreciate your ongoing commitment to your health goals. Please review the following plan we discussed and let me know if I can assist you in the future.   Screening recommendations/referrals: Colonoscopy: completed 02/03/2017 Recommended yearly ophthalmology/optometry visit for glaucoma screening and checkup Recommended yearly dental visit for hygiene and checkup  Vaccinations: Influenza vaccine: completed 05/31/19 Pneumococcal vaccine: declined Tdap vaccine: sent to pharmacy Shingles vaccine: discussed   Covid-19: 12/28/2019  Advanced directives: copy in chart  Conditions/risks identified: none  Next appointment: 07/17/2020 at 8:45 Follow up in one year for your annual wellness visit   Preventive Care 40-64 Years, Male Preventive care refers to lifestyle choices and visits with your health care provider that can promote health and wellness. What does preventive care include?  A yearly physical exam. This is also called an annual well check.  Dental exams once or twice a year.  Routine eye exams. Ask your health care provider how often you should have your eyes checked.  Personal lifestyle choices, including:  Daily care of your teeth and gums.  Regular physical activity.  Eating a healthy diet.  Avoiding tobacco and drug use.  Limiting alcohol use.  Practicing safe sex.  Taking low-dose aspirin every day starting at age 70. What happens during an annual well check? The services and screenings done by your health care provider during your annual well check will depend on your age, overall health, lifestyle risk factors, and family history of disease. Counseling  Your health care provider may ask you questions about your:  Alcohol use.  Tobacco use.  Drug use.  Emotional well-being.  Home and relationship well-being.  Sexual activity.  Eating habits.  Work and work  Statistician. Screening  You may have the following tests or measurements:  Height, weight, and BMI.  Blood pressure.  Lipid and cholesterol levels. These may be checked every 5 years, or more frequently if you are over 1 years old.  Skin check.  Lung cancer screening. You may have this screening every year starting at age 62 if you have a 30-pack-year history of smoking and currently smoke or have quit within the past 15 years.  Fecal occult blood test (FOBT) of the stool. You may have this test every year starting at age 65.  Flexible sigmoidoscopy or colonoscopy. You may have a sigmoidoscopy every 5 years or a colonoscopy every 10 years starting at age 82.  Prostate cancer screening. Recommendations will vary depending on your family history and other risks.  Hepatitis C blood test.  Hepatitis B blood test.  Sexually transmitted disease (STD) testing.  Diabetes screening. This is done by checking your blood sugar (glucose) after you have not eaten for a while (fasting). You may have this done every 1-3 years. Discuss your test results, treatment options, and if necessary, the need for more tests with your health care provider. Vaccines  Your health care provider may recommend certain vaccines, such as:  Influenza vaccine. This is recommended every year.  Tetanus, diphtheria, and acellular pertussis (Tdap, Td) vaccine. You may need a Td booster every 10 years.  Zoster vaccine. You may need this after age 20.  Pneumococcal 13-valent conjugate (PCV13) vaccine. You may need this if you have certain conditions and have not been vaccinated.  Pneumococcal polysaccharide (PPSV23) vaccine. You may need one or two doses if you smoke cigarettes or if you have certain conditions. Talk to your health  care provider about which screenings and vaccines you need and how often you need them. This information is not intended to replace advice given to you by your health care provider. Make  sure you discuss any questions you have with your health care provider. Document Released: 10/04/2015 Document Revised: 05/27/2016 Document Reviewed: 07/09/2015 Elsevier Interactive Patient Education  2017 [place] Prevention in the Home Falls can cause injuries. They can happen to people of all ages. There are many things you can do to make your home safe and to help prevent falls. What can I do on the outside of my home?  Regularly fix the edges of walkways and driveways and fix any cracks.  Remove anything that might make you trip as you walk through a door, such as a raised step or threshold.  Trim any bushes or trees on the path to your home.  Use bright outdoor lighting.  Clear any walking paths of anything that might make someone trip, such as rocks or tools.  Regularly check to see if handrails are loose or broken. Make sure that both sides of any steps have handrails.  Any raised decks and porches should have guardrails on the edges.  Have any leaves, snow, or ice cleared regularly.  Use sand or salt on walking paths during winter.  Clean up any spills in your garage right away. This includes oil or grease spills. What can I do in the bathroom?  Use night lights.  Install grab bars by the toilet and in the tub and shower. Do not use towel bars as grab bars.  Use non-skid mats or decals in the tub or shower.  If you need to sit down in the shower, use a plastic, non-slip stool.  Keep the floor dry. Clean up any water that spills on the floor as soon as it happens.  Remove soap buildup in the tub or shower regularly.  Attach bath mats securely with double-sided non-slip rug tape.  Do not have throw rugs and other things on the floor that can make you trip. What can I do in the bedroom?  Use night lights.  Make sure that you have a light by your bed that is easy to reach.  Do not use any sheets or blankets that are too big for your bed. They should  not hang down onto the floor.  Have a firm chair that has side arms. You can use this for support while you get dressed.  Do not have throw rugs and other things on the floor that can make you trip. What can I do in the kitchen?  Clean up any spills right away.  Avoid walking on wet floors.  Keep items that you use a lot in easy-to-reach places.  If you need to reach something above you, use a strong step stool that has a grab bar.  Keep electrical cords out of the way.  Do not use floor polish or wax that makes floors slippery. If you must use wax, use non-skid floor wax.  Do not have throw rugs and other things on the floor that can make you trip. What can I do with my stairs?  Do not leave any items on the stairs.  Make sure that there are handrails on both sides of the stairs and use them. Fix handrails that are broken or loose. Make sure that handrails are as long as the stairways.  Check any carpeting to make sure that it is firmly  attached to the stairs. Fix any carpet that is loose or worn.  Avoid having throw rugs at the top or bottom of the stairs. If you do have throw rugs, attach them to the floor with carpet tape.  Make sure that you have a light switch at the top of the stairs and the bottom of the stairs. If you do not have them, ask someone to add them for you. What else can I do to help prevent falls?  Wear shoes that:  Do not have high heels.  Have rubber bottoms.  Are comfortable and fit you well.  Are closed at the toe. Do not wear sandals.  If you use a stepladder:  Make sure that it is fully opened. Do not climb a closed stepladder.  Make sure that both sides of the stepladder are locked into place.  Ask someone to hold it for you, if possible.  Clearly mark and make sure that you can see:  Any grab bars or handrails.  First and last steps.  Where the edge of each step is.  Use tools that help you move around (mobility aids) if they are  needed. These include:  Canes.  Walkers.  Scooters.  Crutches.  Turn on the lights when you go into a dark area. Replace any light bulbs as soon as they burn out.  Set up your furniture so you have a clear path. Avoid moving your furniture around.  If any of your floors are uneven, fix them.  If there are any pets around you, be aware of where they are.  Review your medicines with your doctor. Some medicines can make you feel dizzy. This can increase your chance of falling. Ask your doctor what other things that you can do to help prevent falls. This information is not intended to replace advice given to you by your health care provider. Make sure you discuss any questions you have with your health care provider. Document Released: 07/04/2009 Document Revised: 02/13/2016 Document Reviewed: 10/12/2014 Elsevier Interactive Patient Education  2017 [employer].

## 2020-04-10 NOTE — Progress Notes (Signed)
This visit occurred during the SARS-CoV-2 public health emergency.  Safety protocols were in place, including screening questions prior to the visit, additional usage of staff PPE, and extensive cleaning of exam room while observing appropriate contact time as indicated for disinfecting solutions.  Subjective:   Stephen Chase is a 54 y.o. male who presents for Medicare Annual/Subsequent preventive examination.  Review of Systems     Cardiac Risk Factors include: diabetes mellitus;male gender;obesity (BMI >30kg/m2)     Objective:    Today's Vitals   04/10/20 1100  BP: 122/74  Pulse: 73  Temp: 98.1 F (36.7 C)  TempSrc: Oral  Weight: 231 lb (104.8 kg)  Height: '5\' 7"'$  (1.702 m)   Body mass index is 36.18 kg/m.  Advanced Directives 04/10/2020 03/09/2019 12/19/2013  Does Patient Have a Medical Advance Directive? Yes No Patient does not have advance directive;Patient would like information  Type of Advance Directive [place];Living will - -  Copy of [place] in Chart? Yes - validated most recent copy scanned in chart (See row information) - -  Would patient like information on creating a medical advance directive? - No - Patient declined Advance directive packet given    Current Medications (verified) Outpatient Encounter Medications as of 04/10/2020  Medication Sig  . Accu-Chek Softclix Lancets lancets Use as instructed  . aspirin EC 81 MG tablet Take 81 mg by mouth daily.  Marland Kitchen atorvastatin (LIPITOR) 40 MG tablet Take 1 tablet (40 mg total) by mouth at bedtime.  . Blood Glucose Monitoring Suppl (ACCU-CHEK GUIDE) w/Device KIT Use to check blood sugars three times daily E11.9  . carvedilol (COREG) 6.25 MG tablet Take 1 tablet (6.25 mg total) by mouth 2 (two) times daily with a meal.  . diptheria-tetanus toxoids (DECAVAC) 2-2 LF/0.5ML injection Inject 0.5 mLs into the muscle once for 1 dose.  . fenofibrate (TRICOR) 145 MG tablet Take 1 tablet (145 mg  total) by mouth daily.  Marland Kitchen glucose blood (ACCU-CHEK GUIDE) test strip CHECK BLOOD SUGAR 3 TIMES A DAY BEFORE BREAKFAST, LUNCH, AND DINNER  . Icosapent Ethyl (VASCEPA) 1 g CAPS One bid for elevated triglycerides (Patient not taking: Reported on 10/04/2019)  . Insulin Pen Needle (PEN NEEDLES) 32G X 4 MM MISC Inject 1 each into the skin daily.  Marland Kitchen lisinopril (ZESTRIL) 20 MG tablet Take 1 tablet (20 mg total) by mouth daily.  . modafinil (PROVIGIL) 200 MG tablet Take 1 tablet (200 mg total) by mouth daily.  . Multiple Vitamins-Minerals (MULTIVITAMIN WITH MINERALS) tablet Take 1 tablet by mouth daily.  . Niacin, Antihyperlipidemic, (NIACIN ER, ANTIHYPERLIPIDEMIC, PO) Take 2,000 mg by mouth at bedtime.   Marland Kitchen omeprazole (PRILOSEC) 10 MG capsule TAKE 1 CAPSULE BY MOUTH EVERY DAY BEFORE A MEAL  . OVER THE COUNTER MEDICATION Vitamin D 3 One capsule daily.  . predniSONE (DELTASONE) 20 MG tablet Take 1 tablet (20 mg total) by mouth daily with breakfast. For contact early dermatitis (Patient not taking: Reported on 04/10/2020)  . Semaglutide, 1 MG/DOSE, (OZEMPIC, 1 MG/DOSE,) 2 MG/1.5ML SOPN Inject 1 mg into the skin once a week. Inject '1mg'$  into the skin daily  . tamsulosin (FLOMAX) 0.4 MG CAPS capsule TAKE 1 CAPSULE BY MOUTH ONCE DAILY 1/2 HOUR FOLLOWING THE SAME MEAL EACH DAY   No facility-administered encounter medications on file as of 04/10/2020.    Allergies (verified) Patient has no known allergies.   History: Past Medical History:  Diagnosis Date  . Allergy   . Arthritis  SHOULDERS  . Balanitis   . Deafness, sensorineural, childhood onset    BILATERAL SINCE AGE 30  SECONDARY TO UNKNOWN ILLNESS  . GERD (gastroesophageal reflux disease)   . Hyperlipidemia   . Hypertension   . No blood products    Patient is Jehovah Witness no blood products.  . OSA on CPAP    SEVERE PER STUDY 03-09-2009  . Sleep apnea    uses a cpap  . Smokers' cough ([place])   . Type 2 diabetes mellitus ([place])    Past  Surgical History:  Procedure Laterality Date  . CIRCUMCISION N/A 12/19/2013   Procedure: CIRCUMCISION ADULT;  Surgeon: Lowella Bandy, MD;  Location: [facility];  Service: Urology;  Laterality: N/A;  . SHOULDER SURGERY Right   . WRIST SURGERY Left    Family History  Problem Relation Age of Onset  . Cancer Mother        type unknown pt was 20 years old at that time  . Liver disease Father   . Healthy Brother   . Cancer Other         alot of family members have cancer but doesnt know the type  . Colon cancer Neg Hx   . Esophageal cancer Neg Hx   . Rectal cancer Neg Hx   . Stomach cancer Neg Hx    Social History   Socioeconomic History  . Marital status: Married    Spouse name: Not on file  . Number of children: 2  . Years of education: Not on file  . Highest education level: Not on file  Occupational History  . Occupation: unemployed  Tobacco Use  . Smoking status: Former Smoker    Packs/day: 1.00    Years: 30.00    Pack years: 30.00    Types: Cigarettes  . Smokeless tobacco: Never Used  . Tobacco comment: quit 4 years ago  Vaping Use  . Vaping Use: Never used  Substance and Sexual Activity  . Alcohol use: Not Currently    Comment: rarely  . Drug use: No  . Sexual activity: Yes  Other Topics Concern  . Not on file  Social History Narrative  . Not on file   Social Determinants of Health   Financial Resource Strain: Low Risk   . Difficulty of Paying Living Expenses: Not hard at all  Food Insecurity: No Food Insecurity  . Worried About Charity fundraiser in the Last Year: Never true  . Ran Out of Food in the Last Year: Never true  Transportation Needs: No Transportation Needs  . Lack of Transportation (Medical): No  . Lack of Transportation (Non-Medical): No  Physical Activity: Inactive  . Days of Exercise per Week: 0 days  . Minutes of Exercise per Session: 0 min  Stress: No Stress Concern Present  . Feeling of Stress : Not at all  Social  Connections:   . Frequency of Communication with Friends and Family:   . Frequency of Social Gatherings with Friends and Family:   . Attends Religious Services:   . Active Member of Clubs or Organizations:   . Attends Archivist Meetings:   Marland Kitchen Marital Status:     Tobacco Counseling Counseling given: Not Answered Comment: quit 4 years ago   Clinical Intake:  Pre-visit preparation completed: Yes  Pain : No/denies pain     Nutritional Status: BMI > 30  Obese Nutritional Risks: None Diabetes: Yes  How often do you need to have someone help  you when you read instructions, pamphlets, or other written materials from your doctor or pharmacy?: 1 - Never What is the last grade level you completed in school?: 12th grade  Diabetic? Yes Nutrition Risk Assessment:  Has the patient had any N/V/D within the last 2 months?  No  Does the patient have any non-healing wounds?  No  Has the patient had any unintentional weight loss or weight gain?  No   Diabetes:  Is the patient diabetic?  Yes  If diabetic, was a CBG obtained today?  No  Did the patient bring in their glucometer from home?  No  How often do you monitor your CBG's? 2-3 daily.   Financial Strains and Diabetes Management:  Are you having any financial strains with the device, your supplies or your medication? No .  Does the patient want to be seen by Chronic Care Management for management of their diabetes?  No  Would the patient like to be referred to a Nutritionist or for Diabetic Management?  No   Diabetic Exams:  Diabetic Eye Exam: Completed 07/25/2019 Diabetic Foot Exam: Completed today   Interpreter Needed?: Yes  Information entered by :: NAllen LPN   Activities of Daily Living In your present state of health, do you have any difficulty performing the following activities: 04/10/2020 04/10/2020  Hearing? Tempie Donning  Vision? N N  Difficulty concentrating or making decisions? N Y  Walking or climbing  stairs? N N  Dressing or bathing? N N  Doing errands, shopping? N N  Preparing Food and eating ? N -  Using the Toilet? N -  In the past six months, have you accidently leaked urine? N -  Do you have problems with loss of bowel control? N -  Managing your Medications? N -  Managing your Finances? N -  Housekeeping or managing your Housekeeping? N -  Some recent data might be hidden    Patient Care Team: [name], FNP as PCP - General ([place]) Cyril Mourning, [facility] (Pharmacist)  Indicate any recent Medical Services you may have received from other than [facility] providers in the past year (date may be approximate).     Assessment:   This is a routine wellness examination for [place].  Hearing/Vision screen  Hearing Screening   '125Hz'$  '250Hz'$  '500Hz'$  '1000Hz'$  '2000Hz'$  '3000Hz'$  '4000Hz'$  '6000Hz'$  '8000Hz'$   Right ear:           Left ear:           Vision Screening Comments: Regular eye exams, does not remember   Dietary issues and exercise activities discussed: Current Exercise Habits: The patient has a physically strenuous job, but has no regular exercise apart from work.  Goals    .  I would like to apply for financial assistance for my medications (pt-stated)      Current Barriers:  . Financial Barriers: patient has [employer] and reports copay for Ozempic is cost prohibitive at this time  Pharmacist Clinical Goal(s):  Marland Kitchen Over the next 30 days, patient will work with PharmD and providers to relieve medication access concerns  Interventions: Met with patient in clinic face to face . Comprehensive medication review completed; medication list updated in electronic medical record.  Larna Daughters by NovoNordisk: Patient may meets income (pending wife's)/NO out of pocket spend criteria for this medication's patient assistance program. Reviewed application process. Patient will provide proof of income, out of pocket spend report, and will sign application. Will collaborate with primary  care provider  Slyvia Rodriguez-Southworth for their portion of application. Once completed, will submit to NovoNordisk patient assistance program. . If patient does not qualify, we will apply for [employer] program for Trulicity  Patient Self Care Activities:  . Patient will provide necessary portions of application   Initial goal documentation      .  I would like to optimize my medication management of my chronic conditions (pt-stated)      Current Barriers:  . Diabetes: T2DM; most recent A1c 6.8% on 11/23/19  . Current antihyperglycemic regimen: increase to Ozempic '1mg'$  weekly, metformin 1g BID o 11/23/19-Application completed with patient for [employer] patient assistance.  Awaiting patient financial documents.  Patient stated he would drop off bank statements.  Will submit paperwork when all documents received. . denies hypoglycemic symptoms; denies hyperglycemic symptoms . Current exercise: n/a . Current blood glucose readings: FBG 130-160s . Cardiovascular risk reduction: o Current hypertensive regimen: coreg, lisinopril o Current hyperlipidemia regimen: atorvastatin '20mg'$  daily  Pharmacist Clinical Goal(s):  Marland Kitchen Over the next 90 days, patient with work with PharmD and primary care provider to address needs related to optimized medication management of chronic conditions  Interventions: Clinic visit with patient on 11/23/19 . Comprehensive medication review performed, medication list updated in electronic medical record . Comprehensive medication review performed.  Reviewed medication fill history via insurance claims data confirming patient appears compliant with having his medications filled on time as prescribed by provider. . Reviewed & discussed the following diabetes-related information with patient: o Continue checking blood sugars as directed o Follow ADA recommended "diabetes-friendly" diet  (reviewed healthy snack/food options) o Reviewed medication purpose/side  effects-->patient denies adverse events o Continue taking all medications as prescribed by provider  Patient Self Care Activities:  . Patient will check blood glucose daily , document, and provide at future appointments . Patient will take medications as prescribed . Patient will contact provider with any episodes of hypoglycemia . Patient will report any questions or concerns to provider   Please see past updates related to this goal by clicking on the "Past Updates" button in the selected goal      .  Patient Stated      04/10/2020, no goals    .  Weight (lb) < 200 lb (90.7 kg)      Wants to lose weight to get blood sugar under control      Depression Screen PHQ 2/9 Scores 04/10/2020 04/20/2019 03/09/2019 03/09/2019 11/22/2018 11/15/2018 10/25/2018  PHQ - 2 Score 0 0 0 0 0 0 0    Fall Risk Fall Risk  04/10/2020 06/15/2019 04/20/2019 03/09/2019 03/09/2019  Falls in the past year? 0 0 0 0 0  Risk for fall due to : Medication side effect - - Medication side effect -  Follow up Falls evaluation completed;Education provided;Falls prevention discussed - - Education provided;Falls prevention discussed -    Any stairs in or around the home? Yes  If so, are there any without handrails? Yes  Home free of loose throw rugs in walkways, pet beds, electrical cords, etc? Yes  Adequate lighting in your home to reduce risk of falls? Yes   ASSISTIVE DEVICES UTILIZED TO PREVENT FALLS:  Life alert? No  Use of a cane, walker or w/c? No  Grab bars in the bathroom? Yes  Shower chair or bench in shower? No  Elevated toilet seat or a handicapped toilet? No   TIMED UP AND GO:  Was the test performed? No .   Gait steady and  fast without use of assistive device  Cognitive Function:        Immunizations Immunization History  Administered Date(s) Administered  . Hepatitis B, adult 03/03/2017  . Hepb-cpg 07/13/2018, 08/17/2018, 05/12/2019  . Influenza,inj,Quad PF,6+ Mos 07/13/2018, 05/31/2019  .  Janssen (J&J) SARS-COV-2 Vaccination 12/28/2019    TDAP status: Due, Education has been provided regarding the importance of this vaccine. Advised may receive this vaccine at local pharmacy or Health Dept. Aware to provide a copy of the vaccination record if obtained from local pharmacy or Health Dept. Verbalized acceptance and understanding. Flu Vaccine status: Up to date Pneumococcal vaccine status: Declined Covid-19 vaccine status: Completed vaccines  Qualifies for Shingles Vaccine? Yes   Zostavax completed No   Shingrix Completed?: No.    Education has been provided regarding the importance of this vaccine. Patient has been advised to call insurance company to determine out of pocket expense if they have not yet received this vaccine. Advised may also receive vaccine at local pharmacy or Health Dept. Verbalized acceptance and understanding.  Screening Tests Health Maintenance  Topic Date Due  . TETANUS/TDAP  Never done  . FOOT EXAM  03/08/2020  . PNEUMOCOCCAL POLYSACCHARIDE VACCINE AGE 68-64 HIGH RISK  04/10/2021 (Originally 01/08/1968)  . INFLUENZA VACCINE  04/21/2020  . OPHTHALMOLOGY EXAM  07/24/2020  . HEMOGLOBIN A1C  08/23/2020  . COLONOSCOPY  02/03/2022  . COVID-19 Vaccine  Completed  . Hepatitis C Screening  Completed  . HIV Screening  Completed    Health Maintenance  Health Maintenance Due  Topic Date Due  . TETANUS/TDAP  Never done  . FOOT EXAM  03/08/2020    Colorectal cancer screening: Completed 02/03/2017. Repeat every 5 years  Lung Cancer Screening: (Low Dose CT Chest recommended if Age 38-80 years, 30 pack-year currently smoking OR have quit w/in 15years.) does not qualify.   Lung Cancer Screening Referral: no  Additional Screening:  Hepatitis C Screening: does qualify; Completed 02/05/2017  Vision Screening: Recommended annual ophthalmology exams for early detection of glaucoma and other disorders of the eye. Is the patient up to date with their annual eye  exam?  Yes  Who is the provider or what is the name of the office in which the patient attends annual eye exams? Does not remember If pt is not established with a provider, would they like to be referred to a provider to establish care? No .   Dental Screening: Recommended annual dental exams for proper oral hygiene  Community Resource Referral / Chronic Care Management: CRR required this visit?  No   CCM required this visit?  No      Plan:     I have personally reviewed and noted the following in the patient's chart:   . Medical and social history . Use of alcohol, tobacco or illicit drugs  . Current medications and supplements . Functional ability and status . Nutritional status . Physical activity . Advanced directives . List of other physicians . Hospitalizations, surgeries, and ER visits in previous 12 months . Vitals . Screenings to include cognitive, depression, and falls . Referrals and appointments  In addition, I have reviewed and discussed with patient certain preventive protocols, quality metrics, and best practice recommendations. A written personalized care plan for preventive services as well as general preventive health recommendations were provided to patient.     Barb Merino, LPN   03/14/4694   Nurse Notes: Patient declined PPSV23. 6 CIT not completed. Patient is deaf. Has adequate cognition per  direct observation.

## 2020-04-10 NOTE — Patient Instructions (Addendum)

## 2020-04-11 LAB — PSA: Prostate Specific Ag, Serum: 0.6 ng/mL (ref 0.0–4.0)

## 2020-04-11 LAB — CMP14+EGFR
ALT: 34 IU/L (ref 0–44)
AST: 30 IU/L (ref 0–40)
Albumin/Globulin Ratio: 2.4 — ABNORMAL HIGH (ref 1.2–2.2)
Albumin: 5.1 g/dL — ABNORMAL HIGH (ref 3.8–4.9)
Alkaline Phosphatase: 77 IU/L (ref 48–121)
BUN/Creatinine Ratio: 17 (ref 9–20)
BUN: 19 mg/dL (ref 6–24)
Bilirubin Total: 0.7 mg/dL (ref 0.0–1.2)
CO2: 26 mmol/L (ref 20–29)
Calcium: 9.9 mg/dL (ref 8.7–10.2)
Chloride: 99 mmol/L (ref 96–106)
Creatinine, Ser: 1.12 mg/dL (ref 0.76–1.27)
GFR calc Af Amer: 86 mL/min/{1.73_m2} (ref >59)
GFR calc non Af Amer: 75 mL/min/{1.73_m2} (ref >59)
Globulin, Total: 2.1 g/dL (ref 1.5–4.5)
Glucose: 145 mg/dL — ABNORMAL HIGH (ref 65–99)
Potassium: 4.3 mmol/L (ref 3.5–5.2)
Sodium: 139 mmol/L (ref 134–144)
Total Protein: 7.2 g/dL (ref 6.0–8.5)

## 2020-04-11 LAB — LIPID PANEL
Chol/HDL Ratio: 5.6 ratio — ABNORMAL HIGH (ref 0.0–5.0)
Cholesterol, Total: 179 mg/dL (ref 100–199)
HDL: 32 mg/dL — ABNORMAL LOW (ref >39)
LDL Chol Calc (NIH): 106 mg/dL — ABNORMAL HIGH (ref 0–99)
Triglycerides: 239 mg/dL — ABNORMAL HIGH (ref 0–149)
VLDL Cholesterol Cal: 41 mg/dL — ABNORMAL HIGH (ref 5–40)

## 2020-04-11 LAB — MAGNESIUM: Magnesium: 1.9 mg/dL (ref 1.6–2.3)

## 2020-04-14 ENCOUNTER — Other Ambulatory Visit: Payer: Self-pay | Admitting: Adult Health

## 2020-04-15 ENCOUNTER — Encounter: Payer: Self-pay | Admitting: Nurse Practitioner

## 2020-04-21 ENCOUNTER — Other Ambulatory Visit: Payer: Self-pay | Admitting: Internal Medicine

## 2020-04-29 ENCOUNTER — Other Ambulatory Visit: Payer: Self-pay

## 2020-04-29 ENCOUNTER — Ambulatory Visit (INDEPENDENT_AMBULATORY_CARE_PROVIDER_SITE_OTHER): Payer: Medicare PPO

## 2020-04-29 DIAGNOSIS — E1140 Type 2 diabetes mellitus with diabetic neuropathy, unspecified: Secondary | ICD-10-CM

## 2020-04-29 DIAGNOSIS — E782 Mixed hyperlipidemia: Secondary | ICD-10-CM | POA: Diagnosis not present

## 2020-04-29 DIAGNOSIS — I10 Essential (primary) hypertension: Secondary | ICD-10-CM | POA: Diagnosis not present

## 2020-04-29 NOTE — Chronic Care Management (AMB) (Signed)
Chronic Care Management Pharmacy  Name: Stephen Chase  MRN: [id number] DOB: January 04, 1966  Chief Complaint/ HPI  Stephen Chase,  54 y.o. , male presents for their Initial CCM visit with the clinical pharmacist In office with an interpreter  PCP : Stephen Brine, FNP  Their chronic conditions include: Hypertension, Hyperlipidemia and Diabetes  Office Visits: 04/10/20 AWV and OV: Annual exam. BP controlled. CMP to check renal function. Diet and exercise discussed. EKG showed NSR HR 68. Labs ordered (CMP14+EGFR, UA, UA-Microalbumin, PSA, lipid panel). Pt complained of headaches for over a month. Encouraged hydration while working outside and to take magnesium with evening meals (will also help with muscle cramps). Refer to podiatry for pain in toes of both feet.   02/22/20 OV: HgbA1c increased to 7.5%. DM poorly controlled. Advised to bring in food diary for review at next appointment.Pt complained of rash on neck and arms after being outside landscaping. Has used calamine lotion with no relief. Started on prednisone 42m daily for 5 days for allergic contact dermatitis. Advised pt to continue Citrucel for constipation secondary to Ozempic.   01/16/20 Patient message: Pt concerned about not feeling well since receiving J&J Covid vaccine on April 8th. Complains of headache, not feeling like himself, etc. Advised to go to ER to get checked out.   01/14/20 Patient message: pt concerned about high blood sugars and hard stools every other day. Advised to take Metamucil for constipation and increase water intake. Monitor blood sugars.   11/23/19 OV: DM chronic, post prandial glucose is high. Increase Ozempic to 173mweekly. Labs ordered (HgbA1c, CMP14+ anion gap, CBC no diff). Encourage to exercise.   Consult Visits: 10/04/19 Neurology OV w/ Stephen Chase: OSA on CPAP follow up. Download shows excellent compliance. Encouraged to continue using CPAP for  >4 hours each night. Continue Provigil 20069maily. F/U in 1  year.  CCM Encounters: 12/28/19 CPhT: Ozempic approved through [employer] [employer] until 08/20/20.   12/13/19 CPhT: Completed application for [place]empic [facility] [facility].  11/23/19 PharmD: Increase Ozempic to 1mg28mekly. Application completed with pt for Ozempic patient assistance. Waiting on financial documents to submit application.   Medications: Outpatient Encounter Medications as of 04/29/2020  Medication Sig   Accu-Chek Softclix Lancets lancets Use as instructed   aspirin EC 81 MG tablet Take 81 mg by mouth daily.   atorvastatin (LIPITOR) 40 MG tablet TAKE 1 TABLET(40 MG) BY MOUTH AT BEDTIME   Blood Glucose Monitoring Suppl (ACCU-CHEK GUIDE) w/Device KIT Use to check blood sugars three times daily E11.9   carvedilol (COREG) 6.25 MG tablet Take 1 tablet (6.25 mg total) by mouth 2 (two) times daily with a meal.   cholecalciferol (VITAMIN D3) 25 MCG (1000 UNIT) tablet Take 1,000 Units by mouth daily.   Cyanocobalamin (B-12 PO) Take by mouth daily.   fenofibrate (TRICOR) 145 MG tablet Take 1 tablet (145 mg total) by mouth daily.   glucose blood (ACCU-CHEK GUIDE) test strip CHECK BLOOD SUGAR 3 TIMES A DAY BEFORE BREAKFAST, LUNCH, AND DINNER   Insulin Pen Needle (PEN NEEDLES) 32G X 4 MM MISC Inject 1 each into the skin daily.   lisinopril (ZESTRIL) 20 MG tablet Take 1 tablet (20 mg total) by mouth daily.   Magnesium 250 MG TABS Take 1 tablet (250 mg total) by mouth every evening. With evening meals   modafinil (PROVIGIL) 200 MG tablet TAKE 1 TABLET(200 MG) BY MOUTH DAILY   Multiple Vitamins-Minerals (MULTIVITAMIN WITH MINERALS) tablet Take 1 tablet  by mouth daily.   niacin (NIASPAN) 500 MG CR tablet Take 2,000 mg by mouth at bedtime.    Omega-3 Fatty Acids (FISH OIL) 1000 MG CPDR Take by mouth. Take one tablet daily   omeprazole (PRILOSEC) 10 MG capsule TAKE 1 CAPSULE BY MOUTH EVERY DAY BEFORE A MEAL   Semaglutide, 1 MG/DOSE, (OZEMPIC, 1 MG/DOSE,) 2 MG/1.5ML SOPN Inject 1  mg into the skin once a week. Inject 47m into the skin daily   tamsulosin (FLOMAX) 0.4 MG CAPS capsule TAKE 1 CAPSULE BY MOUTH ONCE DAILY 1/2 HOUR FOLLOWING THE SAME MEAL EACH DAY   Icosapent Ethyl (VASCEPA) 1 g CAPS One bid for elevated triglycerides (Patient not taking: Reported on 10/04/2019)   olmesartan-hydrochlorothiazide (BENICAR HCT) 40-25 MG tablet Take 1 tablet by mouth daily. (Patient not taking: Reported on 05/21/2020)   OVER THE COUNTER MEDICATION Vitamin D 3 One capsule daily.   No facility-administered encounter medications on file as of 04/29/2020.    Current Diagnosis/Assessment:  SDOH Interventions     Most Recent Value  SDOH Interventions  Financial Strain Interventions Other (Comment)  [Assist patient with obtaining Ozempic refill through [facility] [employer] assistance program]      Goals Addressed            This VOdell(see longitudinal plan of care for additional care plan information)  Current Barriers:   Chronic Disease Management support, education, and care coordination needs related to Hypertension, Hyperlipidemia, and Diabetes   Hypertension BP Readings from Last 3 Encounters:  04/10/20 122/74  04/10/20 122/74  02/22/20 126/76    Pharmacist Clinical Goal(s): o Over the next 180 days, patient will work with PharmD and providers to maintain BP goal <130/80  Current regimen:   Lisinopril 275mdaily  Carvedilol 6.2569mwice daily with a meal  Interventions: o Provided dietary and exercise recommendations o Discussed appropriate goals for blood pressure (less than 130/80)  Patient self care activities - Over the next 180 days, patient will: o Check BP if symptomatic, document, and provide at future appointments o Ensure daily salt intake < 2300 mg/day o Exercise for 30 minutes daily, 5 times per week  Hyperlipidemia Lab Results  Component Value Date/Time   LDLCALC 106 (H)  04/10/2020 11:28 AM    Pharmacist Clinical Goal(s): o Over the next 90 days, patient will work with PharmD and providers to achieve LDL goal < 70  Current regimen:   Atorvastatin 47m41mily  Fenofibrate 145mg73mly  Omega-3 fatty Acids 1000mg 54my  Interventions: o Provided dietary and exercise recommendations o Notify PCP patient is not taking Vascepa  Patient self care activities - Over the next 90 days, patient will: o Continue cholesterol medications daily as directed o Exercise for 30 minutes daily, 5 times per week  Diabetes Lab Results  Component Value Date/Time   HGBA1C 7.6 (H) 02/22/2020 10:00 AM   HGBA1C 6.8 (H) 11/23/2019 04:51 PM    Pharmacist Clinical Goal(s): o Over the next 90 days, patient will work with PharmD and providers to achieve A1c goal <7%  Current regimen:  o Ozempic 1mg SQ72mekly  Interventions: o Provided dietary and exercise recommendations o Provided patient with samples of Ozempic 0.5mg (pa66mnt knows to administer 2 shots to equal 1mg) sin13mpatient assistance shipment has not arrived o [employer] does not arrive by 8/10, can contact [employer] free  voucher for patient  Patient self care activities - Over the next 90 days, patient will: o Check blood sugar 2-3 times daily, document, and provide at future appointments o Contact provider with any episodes of hypoglycemia o Exercise for 30 minutes daily, 5 times per week  Medication management  Pharmacist Clinical Goal(s): o Over the next 180 days, patient will work with PharmD and providers to maintain optimal medication adherence  Current pharmacy: [facility]  Interventions o Comprehensive medication review performed. o Continue current medication management strategy  Patient self care activities - Over the next 180 days, patient will: o Focus on medication adherence by using a pill box o Take medications as prescribed o Report any  questions or concerns to PharmD and/or provider(s)  Initial goal documentation        Diabetes   A1c goal <7%  Recent Relevant Labs: Lab Results  Component Value Date/Time   HGBA1C 7.6 (H) 02/22/2020 10:00 AM   HGBA1C 6.8 (H) 11/23/2019 04:51 PM   MICROALBUR 80 04/10/2020 11:49 AM   MICROALBUR 30 03/09/2019 03:09 PM    Last diabetic Eye exam:  Lab Results  Component Value Date/Time   HMDIABEYEEXA No Retinopathy 07/25/2019 12:00 AM    Last diabetic Foot exam: Not on file  Checking BG: 2x per Day-3x per Day  Recent FBG Readings: 186,187,162,193,219,246,217 Recent pre-meal BG readings: 170,155 Recent 2hr PP BG readings:   Recent HS BG readings: 125  Patient has failed these meds in past: Invokana, glipizide, metformin Patient is currently uncontrolled on the following medications:  Ozempic 74m weekly  We discussed:   Diet extensively o Pt states he is not eating very much o He eats tostadas, green vegetables, small amount of carbohydrates, and protein o Drinks 6-8 glasses of water daily and Coke zero - Recommend pt drink 64 ounces of water daily  Exercise extensively o Walks 3 times per week for 30 minutes o Recommend pt get 30 minutes of moderate intensity exercise daily 5 times a week (150 minutes total per week)  Pt states he has lost 30 lbs  Patient's Ozempic delivery from [facility] [employer] had not arrived o [employer] should be delivered by 8/10, if not we can get a free voucher for patient to have filled at pharmacy o Gave pt samples of Ozempic 0.552mtoday in office; pt understands he will need to inject 2 doses of 0.27m78mo equal 1mg32mtal  Plan Continue current medications   Hyperlipidemia   LDL goal < 70  Lipid Panel     Component Value Date/Time   CHOL 179 04/10/2020 1128   TRIG 239 (H) 04/10/2020 1128   HDL 32 (L) 04/10/2020 1128   LDLCALC 106 (H) 04/10/2020 1128    Hepatic Function Latest Ref Rng & Units  04/10/2020 11/23/2019 06/28/2019  Total Protein 6.0 - 8.5 g/dL 7.2 7.7 6.7  Albumin 3.8 - 4.9 g/dL 5.1(H) 4.9 4.6  AST 0 - 40 IU/L 30 35 30  ALT 0 - 44 IU/L 34 40 31  Alk Phosphatase 48 - 121 IU/L 77 104 76  Total Bilirubin 0.0 - 1.2 mg/dL 0.7 0.5 0.4  Bilirubin, Direct 0.0 - 0.3 mg/dL - - -     The 10-year ASCVD risk score (GofMikey BussingJr., et al., 2013) is: 12.4%   Values used to calculate the score:     Age: 46 y84rs     Sex: Male     Is Non-Hispanic African American: No  Diabetic: Yes     Tobacco smoker: No     Systolic Blood Pressure: 824 mmHg     Is BP treated: Yes     HDL Cholesterol: 32 mg/dL     Total Cholesterol: 179 mg/dL   Patient has failed these meds in past: Ezetimibe, rosuvastatin Patient is currently uncontrolled on the following medications:   Atorvastatin 28m daily  Vascepa 1g twice daily  Fenofibrate 1437mdaily  Omega-3 fatty Acids 100084maily  We discussed:    Pt states that he gets cramps in his legs (worse when he is working outside and bending a lot; also sometimes occurs when it is cold in the house)  Bengay cream and rubbing helps with cramps  Advised pt that Magnesium can also help with cramps  Cholesterol above goal Diet and exercise extensively Pt states that he is not taking Vascepa (did not have it with him today at office visit)  Plan Continue current medications   Hypertension   BP goal is:  <130/80  Office blood pressures are  BP Readings from Last 3 Encounters:  04/10/20 122/74  04/10/20 122/74  02/22/20 126/76   Patient checks BP at home None- patient does not have home BP cuff Patient home BP readings are ranging: N/A  Patient has failed these meds in the past: olmesartan/HCTZ Patient is currently controlled on the following medications:   Lisinopril 59m79mily  Carvedilol 6.25mg60mce daily with a meal  We discussed: Diet and exercise extensively  Goal BP <130/80, well controlled at recent  visit  Plan Continue current medications   GERD   Patient has failed these meds in past: N/A Patient is currently controlled on the following medications:   Omeprazole 10mg 45my  Plan Continue current medications  Hypersomnia   Patient has failed these meds in past: Armodafinil Patient is currently controlled on the following medications:   Modafinil 100mg d41m  We discussed:    Pt states that he cannot think if he doesn't take this medication ("zombie")  Plan Continue current medications   Prostate   Patient has failed these meds in past: N/A Patient is currently controlled on the following medications:   Tamsulosin 0.4mg dai63m1/2 hour following the same meal each day  Plan Continue current medications  Health Maintenance   Patient is currently on the following medications:   Aspirin 81mg dai64mMagnesium 250mg with34mning meal  Multivitamin daily  Vitamin B12 daily  Niacin 500mg 4 tab38m at bedtime  Vitamin D3 1000 units daily  We discussed:    Pt takes magnesium sometimes o Advised pt that magnesium could also help with cramps  Pt unsure of vitamin B12 strength at this time  Pt reports not using Metamucil much now  Plan Continue current medications   Vaccines   Reviewed and discussed patient's vaccination history.    Immunization History  Administered Date(s) Administered   Hepatitis B, adult 03/03/2017   Hepb-cpg 07/13/2018, 08/17/2018, 05/12/2019   Influenza,inj,Quad PF,6+ Mos 07/13/2018, 05/31/2019   Janssen (J&J) SARS-COV-2 Vaccination 12/28/2019   We discussed:  Pt states he had bad headaches after COVID vaccine for 2 weeks, mostly better now  Plan Review and discuss at follow up   Medication Management   [facility] Uses pill box? Yes Pt endorses 100% compliance  We discussed:   Importance of taking each medications daily as directed  Plan Continue current medication  management strategy  Discuss medication synchronization, adherence packaging, and delivery  available with [facility] at follow up   Follow up: 3 month phone visit  Jannette Fogo, PharmD Clinical Pharmacist [facility] [phone]

## 2020-04-30 ENCOUNTER — Telehealth: Payer: Self-pay

## 2020-04-30 ENCOUNTER — Other Ambulatory Visit: Payer: Self-pay

## 2020-04-30 DIAGNOSIS — M545 Low back pain, unspecified: Secondary | ICD-10-CM

## 2020-04-30 DIAGNOSIS — G8929 Other chronic pain: Secondary | ICD-10-CM

## 2020-04-30 NOTE — Telephone Encounter (Signed)
Pt informed that his ozmepic 1mg   Was here and ready for pickup (PAP)

## 2020-05-09 DIAGNOSIS — M545 Low back pain, unspecified: Secondary | ICD-10-CM | POA: Insufficient documentation

## 2020-05-09 DIAGNOSIS — M5136 Other intervertebral disc degeneration, lumbar region: Secondary | ICD-10-CM | POA: Diagnosis not present

## 2020-05-20 ENCOUNTER — Telehealth: Payer: Self-pay | Admitting: Adult Health

## 2020-05-20 NOTE — Telephone Encounter (Signed)
Received PA request from [email or web address] for modafinil 200mg . PA was started on [email or web address]. Key is [id number]. Per CMM, PA determination will take up to 3-7 business days. Will check back later for determination.

## 2020-05-21 NOTE — Patient Instructions (Addendum)
Visit Information  Goals Addressed            This Visit's Progress   . Pharmacy Care Plan       CARE PLAN ENTRY (see longitudinal plan of care for additional care plan information)  Current Barriers:  . Chronic Disease Management support, education, and care coordination needs related to Hypertension, Hyperlipidemia, and Diabetes   Hypertension BP Readings from Last 3 Encounters:  04/10/20 122/74  04/10/20 122/74  02/22/20 126/76   . Pharmacist Clinical Goal(s): o Over the next 180 days, patient will work with PharmD and providers to maintain BP goal <130/80 . Current regimen:  . Lisinopril 20mg  daily . Carvedilol 6.25mg  twice daily with a meal . Interventions: o Provided dietary and exercise recommendations o Discussed appropriate goals for blood pressure (less than 130/80) . Patient self care activities - Over the next 180 days, patient will: o Check BP if symptomatic, document, and provide at future appointments o Ensure daily salt intake < 2300 mg/day o Exercise for 30 minutes daily, 5 times per week  Hyperlipidemia Lab Results  Component Value Date/Time   LDLCALC 106 (H) 04/10/2020 11:28 AM   . Pharmacist Clinical Goal(s): o Over the next 90 days, patient will work with PharmD and providers to achieve LDL goal < 70 . Current regimen:  . Atorvastatin 40mg  daily . Fenofibrate 145mg  daily . Omega-3 fatty Acids 1000mg  daily . Interventions: o Provided dietary and exercise recommendations o Notify PCP patient is not taking Vascepa . Patient self care activities - Over the next 90 days, patient will: o Continue cholesterol medications daily as directed o Exercise for 30 minutes daily, 5 times per week  Diabetes Lab Results  Component Value Date/Time   HGBA1C 7.6 (H) 02/22/2020 10:00 AM   HGBA1C 6.8 (H) 11/23/2019 04:51 PM   . Pharmacist Clinical Goal(s): o Over the next 90 days, patient will work with PharmD and providers to achieve A1c goal <7% . Current  regimen:  o Ozempic 1mg  SQ weekly . Interventions: o Provided dietary and exercise recommendations o Provided patient with samples of Ozempic 0.5mg  (patient knows to administer 2 shots to equal 1mg ) since patient assistance shipment has not arrived o [employer]; if shipment does not arrive by 8/10, can contact [employer] for 1 month free voucher for patient . Patient self care activities - Over the next 90 days, patient will: o Check blood sugar 2-3 times daily, document, and provide at future appointments o Contact provider with any episodes of hypoglycemia o Exercise for 30 minutes daily, 5 times per week  Medication management . Pharmacist Clinical Goal(s): o Over the next 180 days, patient will work with PharmD and providers to maintain optimal medication adherence . Current pharmacy: [facility] . Interventions o Comprehensive medication review performed. o Continue current medication management strategy . Patient self care activities - Over the next 180 days, patient will: o Focus on medication adherence by using a pill box o Take medications as prescribed o Report any questions or concerns to PharmD and/or provider(s)  Initial goal documentation        Mr. Cupples was given information about Chronic Care Management services today including:  1. CCM service includes personalized support from designated clinical staff supervised by his physician, including individualized plan of care and coordination with other care providers 2. 24/7 contact phone numbers for assistance for urgent and routine care needs. 3. Standard insurance, coinsurance, copays and deductibles apply for chronic care management only during months  in which we provide at least 20 minutes of these services. Most insurances cover these services at 100%, however patients may be responsible for any copay, coinsurance and/or deductible if applicable. This service may help you avoid the need for more expensive  face-to-face services. 4. Only one practitioner may furnish and bill the service in a calendar month. 5. The patient may stop CCM services at any time (effective at the end of the month) by phone call to the office staff.  Patient agreed to services and verbal consent obtained.   The patient verbalized understanding of instructions provided today and agreed to receive a mailed copy of patient instruction and/or educational materials. Face to Face appointment with pharmacist scheduled for:  07/17/20 @ 9:00 AM  Jannette Fogo, PharmD Clinical Pharmacist [facility] [phone]   Diabetes Mellitus and Nutrition, Adult When you have diabetes (diabetes mellitus), it is very important to have healthy eating habits because your blood sugar (glucose) levels are greatly affected by what you eat and drink. Eating healthy foods in the appropriate amounts, at about the same times every day, can help you:  Control your blood glucose.  Lower your risk of heart disease.  Improve your blood pressure.  Reach or maintain a healthy weight. Every person with diabetes is different, and each person has different needs for a meal plan. Your health care provider may recommend that you work with a diet and nutrition specialist (dietitian) to make a meal plan that is best for you. Your meal plan may vary depending on factors such as:  The calories you need.  The medicines you take.  Your weight.  Your blood glucose, blood pressure, and cholesterol levels.  Your activity level.  Other health conditions you have, such as heart or kidney disease. How do carbohydrates affect me? Carbohydrates, also called carbs, affect your blood glucose level more than any other type of food. Eating carbs naturally raises the amount of glucose in your blood. Carb counting is a method for keeping track of how many carbs you eat. Counting carbs is important to keep your blood glucose at a healthy  level, especially if you use insulin or take certain oral diabetes medicines. It is important to know how many carbs you can safely have in each meal. This is different for every person. Your dietitian can help you calculate how many carbs you should have at each meal and for each snack. Foods that contain carbs include:  Bread, cereal, rice, pasta, and crackers.  Potatoes and corn.  Peas, beans, and lentils.  Milk and yogurt.  Fruit and juice.  Desserts, such as cakes, cookies, ice cream, and candy. How does alcohol affect me? Alcohol can cause a sudden decrease in blood glucose (hypoglycemia), especially if you use insulin or take certain oral diabetes medicines. Hypoglycemia can be a life-threatening condition. Symptoms of hypoglycemia (sleepiness, dizziness, and confusion) are similar to symptoms of having too much alcohol. If your health care provider says that alcohol is safe for you, follow these guidelines:  Limit alcohol intake to no more than 1 drink per day for nonpregnant women and 2 drinks per day for men. One drink equals 12 oz of beer, 5 oz of wine, or 1 oz of hard liquor.  Do not drink on an empty stomach.  Keep yourself hydrated with water, diet soda, or unsweetened iced tea.  Keep in mind that regular soda, juice, and other mixers may contain a lot of sugar and must be counted  as carbs. What are tips for following this plan?  Reading food labels  Start by checking the serving size on the "Nutrition Facts" label of packaged foods and drinks. The amount of calories, carbs, fats, and other nutrients listed on the label is based on one serving of the item. Many items contain more than one serving per package.  Check the total grams (g) of carbs in one serving. You can calculate the number of servings of carbs in one serving by dividing the total carbs by 15. For example, if a food has 30 g of total carbs, it would be equal to 2 servings of carbs.  Check the number of  grams (g) of saturated and trans fats in one serving. Choose foods that have low or no amount of these fats.  Check the number of milligrams (mg) of salt (sodium) in one serving. Most people should limit total sodium intake to less than 2,300 mg per day.  Always check the nutrition information of foods labeled as "low-fat" or "nonfat". These foods may be higher in added sugar or refined carbs and should be avoided.  Talk to your dietitian to identify your daily goals for nutrients listed on the label. Shopping  Avoid buying canned, premade, or processed foods. These foods tend to be high in fat, sodium, and added sugar.  Shop around the outside edge of the grocery store. This includes fresh fruits and vegetables, bulk grains, fresh meats, and fresh dairy. Cooking  Use low-heat cooking methods, such as baking, instead of high-heat cooking methods like deep frying.  Cook using healthy oils, such as olive, canola, or sunflower oil.  Avoid cooking with butter, cream, or high-fat meats. Meal planning  Eat meals and snacks regularly, preferably at the same times every day. Avoid going long periods of time without eating.  Eat foods high in fiber, such as fresh fruits, vegetables, beans, and whole grains. Talk to your dietitian about how many servings of carbs you can eat at each meal.  Eat 4-6 ounces (oz) of lean protein each day, such as lean meat, chicken, fish, eggs, or tofu. One oz of lean protein is equal to: ? 1 oz of meat, chicken, or fish. ? 1 egg. ?  cup of tofu.  Eat some foods each day that contain healthy fats, such as avocado, nuts, seeds, and fish. Lifestyle  Check your blood glucose regularly.  Exercise regularly as told by your health care provider. This may include: ? 150 minutes of moderate-intensity or vigorous-intensity exercise each week. This could be brisk walking, biking, or water aerobics. ? Stretching and doing strength exercises, such as yoga or  weightlifting, at least 2 times a week.  Take medicines as told by your health care provider.  Do not use any products that contain nicotine or tobacco, such as cigarettes and e-cigarettes. If you need help quitting, ask your health care provider.  Work with a Social worker or diabetes educator to identify strategies to manage stress and any emotional and social challenges. Questions to ask a health care provider  Do I need to meet with a diabetes educator?  Do I need to meet with a dietitian?  What number can I call if I have questions?  When are the best times to check my blood glucose? Where to find more information:  American Diabetes Association: diabetes.org  Academy of Nutrition and Dietetics: www.eatright.[employer] of Diabetes and Digestive and Kidney Diseases (NIH): [employer] Summary  A healthy meal plan will  help you control your blood glucose and maintain a healthy lifestyle.  Working with a diet and nutrition specialist (dietitian) can help you make a meal plan that is best for you.  Keep in mind that carbohydrates (carbs) and alcohol have immediate effects on your blood glucose levels. It is important to count carbs and to use alcohol carefully. This information is not intended to replace advice given to you by your health care provider. Make sure you discuss any questions you have with your health care provider. Document Revised: 08/20/2017 Document Reviewed: 10/12/2016 Elsevier Patient Education  2020 [employer].

## 2020-05-21 NOTE — Telephone Encounter (Signed)
Received fax from [facility]. PA was approved until 09/20/2020. Will send a copy of the determination letter to patient's pharmacy.

## 2020-05-24 DIAGNOSIS — M545 Low back pain: Secondary | ICD-10-CM | POA: Diagnosis not present

## 2020-05-29 ENCOUNTER — Ambulatory Visit: Payer: Medicare PPO | Admitting: Podiatry

## 2020-05-29 ENCOUNTER — Other Ambulatory Visit: Payer: Self-pay

## 2020-06-05 ENCOUNTER — Encounter: Payer: Self-pay | Admitting: Podiatry

## 2020-06-05 ENCOUNTER — Ambulatory Visit: Payer: Medicare PPO | Admitting: Podiatry

## 2020-06-05 ENCOUNTER — Other Ambulatory Visit: Payer: Self-pay

## 2020-06-05 DIAGNOSIS — M2042 Other hammer toe(s) (acquired), left foot: Secondary | ICD-10-CM | POA: Diagnosis not present

## 2020-06-05 DIAGNOSIS — M2041 Other hammer toe(s) (acquired), right foot: Secondary | ICD-10-CM | POA: Diagnosis not present

## 2020-06-05 DIAGNOSIS — L600 Ingrowing nail: Secondary | ICD-10-CM | POA: Diagnosis not present

## 2020-06-05 DIAGNOSIS — E1140 Type 2 diabetes mellitus with diabetic neuropathy, unspecified: Secondary | ICD-10-CM

## 2020-06-06 ENCOUNTER — Encounter: Payer: Self-pay | Admitting: Podiatry

## 2020-06-06 NOTE — Progress Notes (Signed)
Subjective:  Patient ID: Stephen Chase, male    DOB: [birth year],  MRN: [id number]  Chief Complaint  Patient presents with  . Toe Pain    Bilateral hallux pain, PT stated that it feels like something is going on inside his toe Right hallux is worse than left he feels pressure when he walks     54 y.o. male presents with the above complaint.  Patient presents with complaint of bilateral hallux lateral border ingrown.  Patient states is painful to touch.  Patient would like to have them removed.  Patient states it hurts with pressure.  He denies any other acute complaints.  Patient states that he is diabetic with last A1c of 7.6.  Patient would like to discuss treatment options to get diabetic shoes.  Patient has hammertoe contracture and sugars are little bit elevated he is a risk of developing ulceration.   Review of Systems: Negative except as noted in the HPI. Denies N/V/F/Ch.  Past Medical History:  Diagnosis Date  . Allergy   . Arthritis    SHOULDERS  . Balanitis   . Deafness, sensorineural, childhood onset    BILATERAL SINCE AGE 32  SECONDARY TO UNKNOWN ILLNESS  . GERD (gastroesophageal reflux disease)   . Hyperlipidemia   . Hypertension   . No blood products    Patient is Jehovah Witness no blood products.  . OSA on CPAP    SEVERE PER STUDY 03-09-2009  . Sleep apnea    uses a cpap  . Smokers' cough ([place])   . Type 2 diabetes mellitus ([place])     Current Outpatient Medications:  .  Accu-Chek Softclix Lancets lancets, Use as instructed, Disp: 200 each, Rfl: 3 .  aspirin EC 81 MG tablet, Take 81 mg by mouth daily., Disp: , Rfl:  .  atorvastatin (LIPITOR) 40 MG tablet, TAKE 1 TABLET(40 MG) BY MOUTH AT BEDTIME, Disp: 90 tablet, Rfl: 1 .  Blood Glucose Monitoring Suppl (ACCU-CHEK GUIDE) w/Device KIT, Use to check blood sugars three times daily E11.9, Disp: 1 kit, Rfl: 0 .  carvedilol (COREG) 6.25 MG tablet, Take 1 tablet (6.25 mg total) by mouth 2 (two) times daily with a meal.,  Disp: 180 tablet, Rfl: 1 .  cholecalciferol (VITAMIN D3) 25 MCG (1000 UNIT) tablet, Take 1,000 Units by mouth daily., Disp: , Rfl:  .  Cyanocobalamin (B-12 PO), Take by mouth daily., Disp: , Rfl:  .  fenofibrate (TRICOR) 145 MG tablet, Take 1 tablet (145 mg total) by mouth daily., Disp: 90 tablet, Rfl: 1 .  glucose blood (ACCU-CHEK GUIDE) test strip, CHECK BLOOD SUGAR 3 TIMES A DAY BEFORE BREAKFAST, LUNCH, AND DINNER, Disp: 150 strip, Rfl: 1 .  Icosapent Ethyl (VASCEPA) 1 g CAPS, One bid for elevated triglycerides (Patient not taking: Reported on 10/04/2019), Disp: 180 capsule, Rfl: 0 .  Insulin Pen Needle (PEN NEEDLES) 32G X 4 MM MISC, Inject 1 each into the skin daily., Disp: 30 each, Rfl: 2 .  lisinopril (ZESTRIL) 20 MG tablet, Take 1 tablet (20 mg total) by mouth daily., Disp: 90 tablet, Rfl: 1 .  Magnesium 250 MG TABS, Take 1 tablet (250 mg total) by mouth every evening. With evening meals, Disp: 30 tablet, Rfl: 2 .  modafinil (PROVIGIL) 200 MG tablet, TAKE 1 TABLET(200 MG) BY MOUTH DAILY, Disp: 30 tablet, Rfl: 5 .  Multiple Vitamins-Minerals (MULTIVITAMIN WITH MINERALS) tablet, Take 1 tablet by mouth daily., Disp: , Rfl:  .  niacin (NIASPAN) 500 MG CR tablet, Take 2,000  mg by mouth at bedtime. , Disp: , Rfl:  .  olmesartan-hydrochlorothiazide (BENICAR HCT) 40-25 MG tablet, Take 1 tablet by mouth daily. (Patient not taking: Reported on 05/21/2020), Disp: , Rfl:  .  Omega-3 Fatty Acids (FISH OIL) 1000 MG CPDR, Take by mouth. Take one tablet daily, Disp: , Rfl:  .  omeprazole (PRILOSEC) 10 MG capsule, TAKE 1 CAPSULE BY MOUTH EVERY DAY BEFORE A MEAL, Disp: 90 capsule, Rfl: 1 .  OVER THE COUNTER MEDICATION, Vitamin D 3 One capsule daily., Disp: , Rfl:  .  Semaglutide, 1 MG/DOSE, (OZEMPIC, 1 MG/DOSE,) 2 MG/1.5ML SOPN, Inject 1 mg into the skin once a week. Inject $RemoveBefo'1mg'[id number]$  into the skin daily, Disp: , Rfl:  .  tamsulosin (FLOMAX) 0.4 MG CAPS capsule, TAKE 1 CAPSULE BY MOUTH ONCE DAILY 1/2 HOUR FOLLOWING THE  SAME MEAL EACH DAY, Disp: 90 capsule, Rfl: 1  Social History   Tobacco Use  Smoking Status Former Smoker  . Packs/day: 1.00  . Years: 30.00  . Pack years: 30.00  . Types: Cigarettes  Smokeless Tobacco Never Used  Tobacco Comment   quit 4 years ago    No Known Allergies Objective:  There were no vitals filed for this visit. There is no height or weight on file to calculate BMI. Constitutional Well developed. Well nourished.  Vascular Dorsalis pedis pulses palpable bilaterally. Posterior tibial pulses palpable bilaterally. Capillary refill normal to all digits.  No cyanosis or clubbing noted. Pedal hair growth normal.  Neurologic Normal speech. Oriented to person, place, and time. Epicritic sensation to light touch grossly present bilaterally.  Dermatologic Painful ingrowing nail at lateral nail borders of the hallux nail bilaterally. No other open wounds. No skin lesions.  Orthopedic: Normal joint ROM without pain or crepitus bilaterally. No visible deformities. No bony tenderness.   Radiographs: None Assessment:   1. Ingrown toenail of right foot   2. Ingrown left big toenail   3. Hammertoe, bilateral   4. Type 2 diabetes mellitus with diabetic neuropathy, without long-term current use of insulin ([place])    Plan:  Patient was evaluated and treated and all questions answered.  Ingrown Nail, bilaterally -Patient elects to proceed with minor surgery to remove ingrown toenail removal today. Consent reviewed and signed by patient. -Ingrown nail excised. See procedure note. -Educated on post-procedure care including soaking. Written instructions provided and reviewed. -Patient to follow up in 2 weeks for nail check. -Given the patient is a diabetic with A1c that is still below 8% I feel comfortable proceeding with an ingrown nail procedure however I did discuss with the patient that he is a high risk of developing ulceration and wound secondary to diabetes.  Patient states  understanding will still like to proceed despite the risks.  Hammertoe contractures 2 through 5 bilaterally -I explained to patient the etiology of hammertoe contractures given that he is diabetic with last A1c of 7.6 and are becoming little bit uncontrolled I believe he will benefit from diabetic shoes with insoles to prevent ulceration.  Patient states understanding -He will be scheduled to see Liliane Channel for diabetic shoes  Procedure: Excision of Ingrown Toenail Location: Bilateral 1st toe lateral nail borders. Anesthesia: Lidocaine 1% plain; 1.5 mL and Marcaine 0.5% plain; 1.5 mL, digital block. Skin Prep: Betadine. Dressing: Silvadene; telfa; dry, sterile, compression dressing. Technique: Following skin prep, the toe was exsanguinated and a tourniquet was secured at the base of the toe. The affected nail border was freed, split with a nail splitter, and excised.  Chemical matrixectomy was then performed with phenol and irrigated out with alcohol. The tourniquet was then removed and sterile dressing applied. Disposition: Patient tolerated procedure well. Patient to return in 2 weeks for follow-up.   Return in about 2 weeks (around 06/19/2020) for DR. Monnie Anspach .

## 2020-06-07 DIAGNOSIS — M545 Low back pain: Secondary | ICD-10-CM | POA: Diagnosis not present

## 2020-06-12 DIAGNOSIS — M545 Low back pain: Secondary | ICD-10-CM | POA: Diagnosis not present

## 2020-06-14 ENCOUNTER — Other Ambulatory Visit: Payer: Self-pay | Admitting: Internal Medicine

## 2020-06-19 DIAGNOSIS — M545 Low back pain: Secondary | ICD-10-CM | POA: Diagnosis not present

## 2020-06-21 ENCOUNTER — Ambulatory Visit (INDEPENDENT_AMBULATORY_CARE_PROVIDER_SITE_OTHER): Payer: Medicare PPO | Admitting: Podiatry

## 2020-06-21 ENCOUNTER — Ambulatory Visit: Payer: Medicare PPO | Admitting: Orthotics

## 2020-06-21 ENCOUNTER — Other Ambulatory Visit: Payer: Self-pay

## 2020-06-21 ENCOUNTER — Encounter: Payer: Self-pay | Admitting: Podiatry

## 2020-06-21 DIAGNOSIS — L600 Ingrowing nail: Secondary | ICD-10-CM

## 2020-06-21 DIAGNOSIS — L603 Nail dystrophy: Secondary | ICD-10-CM

## 2020-06-21 DIAGNOSIS — E1140 Type 2 diabetes mellitus with diabetic neuropathy, unspecified: Secondary | ICD-10-CM

## 2020-06-21 DIAGNOSIS — M2041 Other hammer toe(s) (acquired), right foot: Secondary | ICD-10-CM

## 2020-06-21 DIAGNOSIS — M2042 Other hammer toe(s) (acquired), left foot: Secondary | ICD-10-CM

## 2020-06-21 NOTE — Progress Notes (Signed)
Subjective:  Patient ID: Stephen Chase, male    DOB: April 25, 1966,  MRN: [id number]  Chief Complaint  Patient presents with  . Ingrown Toenail    bilateral ingrown pt states pain has improved but it still hurts     54 y.o. male presents with the above complaint.  Patient presents with a complaint of bilateral medial border ingrown.  Patient states is painful to touch.  Patient had lateral borders of the same ingrown was done by me previously which seem to have healed really well.  Now he is having problem on the inside/medial border of the hallux.  He would like to have it removed.  He denies any other acute complaints.  He continues to hurt him.   Review of Systems: Negative except as noted in the HPI. Denies N/V/F/Ch.  Past Medical History:  Diagnosis Date  . Allergy   . Arthritis    SHOULDERS  . Balanitis   . Deafness, sensorineural, childhood onset    BILATERAL SINCE AGE 69  SECONDARY TO UNKNOWN ILLNESS  . GERD (gastroesophageal reflux disease)   . Hyperlipidemia   . Hypertension   . No blood products    Patient is Jehovah Witness no blood products.  . OSA on CPAP    SEVERE PER STUDY 03-09-2009  . Sleep apnea    uses a cpap  . Smokers' cough ([place])   . Type 2 diabetes mellitus ([place])     Current Outpatient Medications:  .  Accu-Chek Softclix Lancets lancets, Use as instructed, Disp: 200 each, Rfl: 3 .  aspirin EC 81 MG tablet, Take 81 mg by mouth daily., Disp: , Rfl:  .  atorvastatin (LIPITOR) 40 MG tablet, TAKE 1 TABLET(40 MG) BY MOUTH AT BEDTIME, Disp: 90 tablet, Rfl: 1 .  Blood Glucose Monitoring Suppl (ACCU-CHEK GUIDE) w/Device KIT, Use to check blood sugars three times daily E11.9, Disp: 1 kit, Rfl: 0 .  carvedilol (COREG) 6.25 MG tablet, Take 1 tablet (6.25 mg total) by mouth 2 (two) times daily with a meal., Disp: 180 tablet, Rfl: 1 .  cholecalciferol (VITAMIN D3) 25 MCG (1000 UNIT) tablet, Take 1,000 Units by mouth daily., Disp: , Rfl:  .  Cyanocobalamin (B-12 PO),  Take by mouth daily., Disp: , Rfl:  .  fenofibrate (TRICOR) 145 MG tablet, Take 1 tablet (145 mg total) by mouth daily., Disp: 90 tablet, Rfl: 1 .  glucose blood (ACCU-CHEK GUIDE) test strip, CHECK BLOOD SUGAR 3 TIMES A DAY BEFORE BREAKFAST, LUNCH, AND DINNER, Disp: 150 strip, Rfl: 1 .  Icosapent Ethyl (VASCEPA) 1 g CAPS, One bid for elevated triglycerides, Disp: 180 capsule, Rfl: 0 .  Insulin Pen Needle (PEN NEEDLES) 32G X 4 MM MISC, Inject 1 each into the skin daily., Disp: 30 each, Rfl: 2 .  lisinopril (ZESTRIL) 20 MG tablet, Take 1 tablet (20 mg total) by mouth daily., Disp: 90 tablet, Rfl: 1 .  Magnesium 250 MG TABS, Take 1 tablet (250 mg total) by mouth every evening. With evening meals, Disp: 30 tablet, Rfl: 2 .  modafinil (PROVIGIL) 200 MG tablet, TAKE 1 TABLET(200 MG) BY MOUTH DAILY, Disp: 30 tablet, Rfl: 5 .  Multiple Vitamins-Minerals (MULTIVITAMIN WITH MINERALS) tablet, Take 1 tablet by mouth daily., Disp: , Rfl:  .  niacin (NIASPAN) 500 MG CR tablet, Take 2,000 mg by mouth at bedtime. , Disp: , Rfl:  .  olmesartan-hydrochlorothiazide (BENICAR HCT) 40-25 MG tablet, Take 1 tablet by mouth daily. , Disp: , Rfl:  .  Omega-3  Fatty Acids (FISH OIL) 1000 MG CPDR, Take by mouth. Take one tablet daily, Disp: , Rfl:  .  omeprazole (PRILOSEC) 10 MG capsule, TAKE 1 CAPSULE BY MOUTH EVERY DAY BEFORE A MEAL, Disp: 90 capsule, Rfl: 1 .  OVER THE COUNTER MEDICATION, Vitamin D 3 One capsule daily., Disp: , Rfl:  .  Semaglutide, 1 MG/DOSE, (OZEMPIC, 1 MG/DOSE,) 2 MG/1.5ML SOPN, Inject 1 mg into the skin once a week. Inject 3m into the skin daily, Disp: , Rfl:  .  tamsulosin (FLOMAX) 0.4 MG CAPS capsule, TAKE 1 CAPSULE BY MOUTH ONCE DAILY 1/2 HOUR FOLLOWING THE SAME MEAL EACH DAY, Disp: 90 capsule, Rfl: 1  Social History   Tobacco Use  Smoking Status Former Smoker  . Packs/day: 1.00  . Years: 30.00  . Pack years: 30.00  . Types: Cigarettes  Smokeless Tobacco Never Used  Tobacco Comment   quit 4  years ago    No Known Allergies Objective:  There were no vitals filed for this visit. There is no height or weight on file to calculate BMI. Constitutional Well developed. Well nourished.  Vascular Dorsalis pedis pulses palpable bilaterally. Posterior tibial pulses palpable bilaterally. Capillary refill normal to all digits.  No cyanosis or clubbing noted. Pedal hair growth normal.  Neurologic Normal speech. Oriented to person, place, and time. Epicritic sensation to light touch grossly present bilaterally.  Dermatologic Painful ingrowing nail at medial nail borders of the hallux nail bilaterally. No other open wounds. No skin lesions.  Orthopedic: Normal joint ROM without pain or crepitus bilaterally. No visible deformities. No bony tenderness.   Radiographs: None Assessment:   1. Nail dystrophy   2. Type 2 diabetes mellitus with diabetic neuropathy, without long-term current use of insulin ([facility]   3. Ingrown toenail of right foot   4. Ingrown left big toenail    Plan:  Patient was evaluated and treated and all questions answered.  Ingrown Nail, bilaterally with underlying nail dystrophy -Patient elects to proceed with minor surgery to remove ingrown toenail removal today. Consent reviewed and signed by patient. -Ingrown nail excised. See procedure note. -Educated on post-procedure care including soaking. Written instructions provided and reviewed. -Patient to follow up in 2 weeks for nail check.  Procedure: Excision of Ingrown Toenail Location: Bilateral 1st toe medial nail borders. Anesthesia: Lidocaine 1% plain; 1.5 mL and Marcaine 0.5% plain; 1.5 mL, digital block. Skin Prep: Betadine. Dressing: Silvadene; telfa; dry, sterile, compression dressing. Technique: Following skin prep, the toe was exsanguinated and a tourniquet was secured at the base of the toe. The affected nail border was freed, split with a nail splitter, and excised. Chemical matrixectomy was then  performed with phenol and irrigated out with alcohol. The tourniquet was then removed and sterile dressing applied. Disposition: Patient tolerated procedure well. Patient to return in 2 weeks for follow-up.   No follow-ups on file.

## 2020-06-21 NOTE — Progress Notes (Signed)
Being seen by NP and doesn't know the doctor who is her supervising MD; also doesn't know if he would approve.

## 2020-06-27 DIAGNOSIS — M5416 Radiculopathy, lumbar region: Secondary | ICD-10-CM | POA: Diagnosis not present

## 2020-07-01 ENCOUNTER — Telehealth: Payer: Self-pay | Admitting: Pharmacist

## 2020-07-01 NOTE — Chronic Care Management (AMB) (Signed)
    Chronic Care Management Pharmacy Assistant   Name: Stephen Chase  MRN: [id number] DOB: 22-Sep-1965  Reason for Encounter: Medication Review - Patient Assistance Coordination.     PCP : Stephen Brine, FNP  Allergies:  No Known Allergies  Medications: Outpatient Encounter Medications as of 07/01/2020  Medication Sig  . Accu-Chek Softclix Lancets lancets Use as instructed  . aspirin EC 81 MG tablet Take 81 mg by mouth daily.  Marland Kitchen atorvastatin (LIPITOR) 40 MG tablet TAKE 1 TABLET(40 MG) BY MOUTH AT BEDTIME  . Blood Glucose Monitoring Suppl (ACCU-CHEK GUIDE) w/Device KIT Use to check blood sugars three times daily E11.9  . carvedilol (COREG) 6.25 MG tablet Take 1 tablet (6.25 mg total) by mouth 2 (two) times daily with a meal.  . cholecalciferol (VITAMIN D3) 25 MCG (1000 UNIT) tablet Take 1,000 Units by mouth daily.  . Cyanocobalamin (B-12 PO) Take by mouth daily.  . fenofibrate (TRICOR) 145 MG tablet Take 1 tablet (145 mg total) by mouth daily.  Marland Kitchen glucose blood (ACCU-CHEK GUIDE) test strip CHECK BLOOD SUGAR 3 TIMES A DAY BEFORE BREAKFAST, LUNCH, AND DINNER  . Icosapent Ethyl (VASCEPA) 1 g CAPS One bid for elevated triglycerides  . Insulin Pen Needle (PEN NEEDLES) 32G X 4 MM MISC Inject 1 each into the skin daily.  Marland Kitchen lisinopril (ZESTRIL) 20 MG tablet Take 1 tablet (20 mg total) by mouth daily.  . Magnesium 250 MG TABS Take 1 tablet (250 mg total) by mouth every evening. With evening meals  . modafinil (PROVIGIL) 200 MG tablet TAKE 1 TABLET(200 MG) BY MOUTH DAILY  . Multiple Vitamins-Minerals (MULTIVITAMIN WITH MINERALS) tablet Take 1 tablet by mouth daily.  . niacin (NIASPAN) 500 MG CR tablet Take 2,000 mg by mouth at bedtime.   Marland Kitchen olmesartan-hydrochlorothiazide (BENICAR HCT) 40-25 MG tablet Take 1 tablet by mouth daily.   . Omega-3 Fatty Acids (FISH OIL) 1000 MG CPDR Take by mouth. Take one tablet daily  . omeprazole (PRILOSEC) 10 MG capsule TAKE 1 CAPSULE BY MOUTH EVERY DAY BEFORE A  MEAL  . OVER THE COUNTER MEDICATION Vitamin D 3 One capsule daily.  . Semaglutide, 1 MG/DOSE, (OZEMPIC, 1 MG/DOSE,) 2 MG/1.5ML SOPN Inject 1 mg into the skin once a week. Inject $RemoveBefo'1mg'rxOIsLBJhnS$  into the skin daily  . tamsulosin (FLOMAX) 0.4 MG CAPS capsule TAKE 1 CAPSULE BY MOUTH ONCE DAILY 1/2 HOUR FOLLOWING THE SAME MEAL EACH DAY   No facility-administered encounter medications on file as of 07/01/2020.    Current Diagnosis: Patient Active Problem List   Diagnosis Date Noted  . Hypersomnia, persistent 05/09/2018  . Type 2 diabetes mellitus with diabetic neuropathy, without long-term current use of insulin ([place]) 05/09/2018  . Uncontrolled type 2 diabetes mellitus with hyperglycemia ([place]) 05/09/2018  . OSA on CPAP 05/09/2018  . Esophageal varices without bleeding ([place]) 05/09/2018  . Bilateral deafness 05/09/2018      Follow-Up:  Patient Assistance Coordination- Reorder form filled out to [employer] for Ozempic 1 mg / dose. Awaiting provider signature to fax.   07/10/2020  Called [employer] to check on status of application, Processed today, will be at PCP in 14 days.  Stephen Chase, CPP . Notified  Stephen Chase, [place] Pharmacist Assistant [phone]

## 2020-07-17 ENCOUNTER — Ambulatory Visit: Payer: Self-pay

## 2020-07-17 ENCOUNTER — Ambulatory Visit: Payer: Medicare PPO | Admitting: Nurse Practitioner

## 2020-07-17 ENCOUNTER — Encounter: Payer: Self-pay | Admitting: Nurse Practitioner

## 2020-07-17 ENCOUNTER — Other Ambulatory Visit: Payer: Self-pay | Admitting: Nurse Practitioner

## 2020-07-17 ENCOUNTER — Other Ambulatory Visit: Payer: Self-pay

## 2020-07-17 VITALS — BP 124/74 | HR 77 | Temp 98.1°F | Ht 67.0 in | Wt 238.4 lb

## 2020-07-17 DIAGNOSIS — Z9989 Dependence on other enabling machines and devices: Secondary | ICD-10-CM | POA: Diagnosis not present

## 2020-07-17 DIAGNOSIS — Z23 Encounter for immunization: Secondary | ICD-10-CM

## 2020-07-17 DIAGNOSIS — E1140 Type 2 diabetes mellitus with diabetic neuropathy, unspecified: Secondary | ICD-10-CM

## 2020-07-17 DIAGNOSIS — I10 Essential (primary) hypertension: Secondary | ICD-10-CM | POA: Diagnosis not present

## 2020-07-17 DIAGNOSIS — G4733 Obstructive sleep apnea (adult) (pediatric): Secondary | ICD-10-CM | POA: Diagnosis not present

## 2020-07-17 DIAGNOSIS — E782 Mixed hyperlipidemia: Secondary | ICD-10-CM | POA: Diagnosis not present

## 2020-07-17 DIAGNOSIS — M791 Myalgia, unspecified site: Secondary | ICD-10-CM | POA: Diagnosis not present

## 2020-07-17 MED ORDER — CYCLOBENZAPRINE HCL 10 MG PO TABS: 10.0000 mg | ORAL_TABLET | Freq: Three times a day (TID) | ORAL | 0 refills | Status: DC | PRN

## 2020-07-17 MED ORDER — TETANUS-DIPHTH-ACELL PERTUSSIS 5-2.5-18.5 LF-MCG/0.5 IM SUSP: 0.5000 mL | Freq: Once | INTRAMUSCULAR | 0 refills | Status: AC

## 2020-07-17 NOTE — Progress Notes (Signed)
This visit occurred during the SARS-CoV-2 public health emergency.  Safety   I,Yamilka Roman [employer] as a Education administrator for [employer], FNP.,have documented all relevant documentation on the behalf of Stephen Brine, FNP,as directed by  Stephen Brine, FNP while in the presence of Stephen Chase, [place]. This visit occurred during the SARS-CoV-2 public health emergency.  Safety protocols were in place, including screening questions prior to the visit, additional usage of staff PPE, and extensive cleaning of exam room while observing appropriate contact time as indicated for disinfecting solutions.  Subjective:     Patient ID: Stephen Chase , male    DOB: August 27, 1966 , 54 y.o.   MRN: [id number]   Chief Complaint  Patient presents with  . Diabetes  . Hypertension    HPI  Patient here for a dm/htn f/u  Wt Readings from Last 3 Encounters: 07/17/20 : 238 lb 6.4 oz (108.1 kg) 04/10/20 : 231 lb (104.8 kg) 04/10/20 : 231 lb 12.8 oz (105.1 kg)  He has seen the orthopedic for his low back L2, L4 and L5 - disk is narrow he has been trying to exercise and he has arthritis. Sometimes he has pain that causes him to be difficult to work and mow the grass.  He has also seen the podiatrist for his toenail to both feet (great toe).  He is doing much better.     Sign language interpreter Juliann Pulse is present.  Diabetes He presents for his follow-up diabetic visit. He has type 2 diabetes mellitus. His disease course has been stable (not much improved). Pertinent negatives for hypoglycemia include no dizziness or headaches. Pertinent negatives for diabetes include no chest pain. There are no hypoglycemic complications. Symptoms are stable. There are no diabetic complications. Risk factors for coronary artery disease include obesity and sedentary lifestyle. He is following a generally unhealthy diet. When asked about meal planning, he reported none. He has not had a previous visit with a dietitian. He participates in  exercise intermittently (he is walking alot and working a strenuous job.). (Average blood sugar 175.  ) Eye exam is current.  Hypertension This is a chronic problem. The current episode started more than 1 year ago. The problem is controlled. Pertinent negatives include no chest pain, headaches or palpitations. There are no associated agents to hypertension. Risk factors for coronary artery disease include obesity, sedentary lifestyle, dyslipidemia, diabetes mellitus and male gender. Past treatments include ACE inhibitors. There are no compliance problems.  There is no history of chronic renal disease.     Past Medical History:  Diagnosis Date  . Allergy   . Arthritis    SHOULDERS  . Balanitis   . Deafness, sensorineural, childhood onset    BILATERAL SINCE AGE 89  SECONDARY TO UNKNOWN ILLNESS  . GERD (gastroesophageal reflux disease)   . Hyperlipidemia   . Hypertension   . No blood products    Patient is Jehovah Witness no blood products.  . OSA on CPAP    SEVERE PER STUDY 03-09-2009  . Sleep apnea    uses a cpap  . Smokers' cough ([place])   . Type 2 diabetes mellitus ([facility])      Family History  Problem Relation Age of Onset  . Cancer Mother        type unknown pt was 2 years old at that time  . Liver disease Father   . Healthy Brother   . Cancer Other         alot of family members have  cancer but doesnt know the type  . Colon cancer Neg Hx   . Esophageal cancer Neg Hx   . Rectal cancer Neg Hx   . Stomach cancer Neg Hx      Current Outpatient Medications:  .  Accu-Chek Softclix Lancets lancets, Use as instructed, Disp: 200 each, Rfl: 3 .  aspirin EC 81 MG tablet, Take 81 mg by mouth daily., Disp: , Rfl:  .  atorvastatin (LIPITOR) 40 MG tablet, TAKE 1 TABLET(40 MG) BY MOUTH AT BEDTIME, Disp: 90 tablet, Rfl: 1 .  Blood Glucose Monitoring Suppl (ACCU-CHEK GUIDE) w/Device KIT, Use to check blood sugars three times daily E11.9, Disp: 1 kit, Rfl: 0 .  carvedilol (COREG) 6.25 MG  tablet, Take 1 tablet (6.25 mg total) by mouth 2 (two) times daily with a meal., Disp: 180 tablet, Rfl: 1 .  cholecalciferol (VITAMIN D3) 25 MCG (1000 UNIT) tablet, Take 1,000 Units by mouth daily., Disp: , Rfl:  .  Cyanocobalamin (B-12 PO), Take by mouth daily., Disp: , Rfl:  .  glucose blood (ACCU-CHEK GUIDE) test strip, CHECK BLOOD SUGAR 3 TIMES A DAY BEFORE BREAKFAST, LUNCH, AND DINNER, Disp: 150 strip, Rfl: 1 .  Icosapent Ethyl (VASCEPA) 1 g CAPS, One bid for elevated triglycerides, Disp: 180 capsule, Rfl: 0 .  Insulin Pen Needle (PEN NEEDLES) 32G X 4 MM MISC, Inject 1 each into the skin daily., Disp: 30 each, Rfl: 2 .  lisinopril (ZESTRIL) 20 MG tablet, Take 1 tablet (20 mg total) by mouth daily., Disp: 90 tablet, Rfl: 1 .  Magnesium 250 MG TABS, Take 1 tablet (250 mg total) by mouth every evening. With evening meals, Disp: 30 tablet, Rfl: 2 .  modafinil (PROVIGIL) 200 MG tablet, TAKE 1 TABLET(200 MG) BY MOUTH DAILY, Disp: 30 tablet, Rfl: 5 .  Multiple Vitamins-Minerals (MULTIVITAMIN WITH MINERALS) tablet, Take 1 tablet by mouth daily., Disp: , Rfl:  .  niacin (NIASPAN) 500 MG CR tablet, Take 2,000 mg by mouth at bedtime. , Disp: , Rfl:  .  olmesartan-hydrochlorothiazide (BENICAR HCT) 40-25 MG tablet, Take 1 tablet by mouth daily. , Disp: , Rfl:  .  Omega-3 Fatty Acids (FISH OIL) 1000 MG CPDR, Take by mouth. Take one tablet daily, Disp: , Rfl:  .  omeprazole (PRILOSEC) 10 MG capsule, TAKE 1 CAPSULE BY MOUTH EVERY DAY BEFORE A MEAL, Disp: 90 capsule, Rfl: 1 .  OVER THE COUNTER MEDICATION, Vitamin D 3 One capsule daily., Disp: , Rfl:  .  Semaglutide, 1 MG/DOSE, (OZEMPIC, 1 MG/DOSE,) 2 MG/1.5ML SOPN, Inject 1 mg into the skin once a week. Inject 20m into the skin daily, Disp: , Rfl:  .  tamsulosin (FLOMAX) 0.4 MG CAPS capsule, TAKE 1 CAPSULE BY MOUTH ONCE DAILY 1/2 HOUR FOLLOWING THE SAME MEAL EACH DAY, Disp: 90 capsule, Rfl: 1 .  cyclobenzaprine (FLEXERIL) 10 MG tablet, Take 1 tablet (10 mg  total) by mouth 3 (three) times daily as needed for muscle spasms., Disp: 30 tablet, Rfl: 0 .  fenofibrate (TRICOR) 145 MG tablet, TAKE 1 TABLET(145 MG) BY MOUTH DAILY, Disp: 90 tablet, Rfl: 1 .  Tdap (BOOSTRIX) 5-2.5-18.5 LF-MCG/0.5 injection, Inject 0.5 mLs into the muscle once for 1 dose., Disp: 0.5 mL, Rfl: 0   No Known Allergies   Review of Systems  Constitutional: Negative.   Respiratory: Negative.   Cardiovascular: Negative.  Negative for chest pain, palpitations and leg swelling.  Gastrointestinal: Negative.   Genitourinary: Negative.   Musculoskeletal: Positive for back pain (chronic, told  he has arthritis and lumbar narrowing. he is also having pain to upper right back area which has been ongoing for the last year).       Right posterior upper back pain intermittent for the last year.   Skin: Negative.        He is using epsom salt.  Neurological: Negative for dizziness and headaches.  Hematological: Negative.   Psychiatric/Behavioral: Negative.      Today's Vitals   07/17/20 0859  BP: 124/74  Pulse: 77  Temp: 98.1 F (36.7 C)  Weight: 238 lb 6.4 oz (108.1 kg)  Height: _0  (1.702 m)  PainSc: 2   PainLoc: Back   Body mass index is 37.34 kg/m.   Objective:  Physical Exam Vitals reviewed.  Constitutional:      General: He is not in acute distress.    Appearance: Normal appearance. He is obese.  Cardiovascular:     Rate and Rhythm: Normal rate and regular rhythm.     Pulses: Normal pulses.     Heart sounds: Normal heart sounds. No murmur heard.   Pulmonary:     Effort: Pulmonary effort is normal. No respiratory distress.     Breath sounds: Normal breath sounds. No wheezing.  Musculoskeletal:        General: No swelling.  Skin:    General: Skin is warm and dry.     Capillary Refill: Capillary refill takes less than 2 seconds.  Neurological:     General: No focal deficit present.     Mental Status: He is alert and oriented to person, place, and time.   Psychiatric:        Mood and Affect: Mood normal.        Behavior: Behavior normal.        Thought Content: Thought content normal.        Judgment: Judgment normal.         Assessment And Plan:     1. Type 2 diabetes mellitus with diabetic neuropathy, without long-term current use of insulin ([facility])  Chronic, stable and not well controlled  Continue with current medications, tolerating ozempic well  Encouraged to limit intake of sugary foods and drinks  Encouraged to continue with physical activity of at least 150 minutes per week as tolerated  Diabetic foot exam done which was normal.  - CMP14+EGFR - Hemoglobin A1c  2. Essential hypertension  Chronic, well controlled  Continue with follow up with Cardiology  Continue with current medications - CMP14+EGFR  3. Need for influenza vaccination  Influenza vaccine administered  Encouraged to take Tylenol as needed for fever or muscle aches. - Flu Vaccine QUAD 6+ mos PF IM (Fluarix Quad PF)  4. OSA on CPAP  Chronic, continue with CPAP  5. Muscle tension pain  Right upper back pain, rib and chest xray done previously which was normal  He does a lot of lifting so this could be more muscle related  Will treat with muscle relaxer. - cyclobenzaprine (FLEXERIL) 10 MG tablet; Take 1 tablet (10 mg total) by mouth 3 (three) times daily as needed for muscle spasms.  Dispense: 30 tablet; Refill: 0  6. Encounter for immunization  Sent prescription to [facility].  TDAP will be administered to adults 26-88 years old every 10 years. - Tdap (BOOSTRIX) 5-2.5-18.5 LF-MCG/0.5 injection; Inject 0.5 mLs into the muscle once for 1 dose.  Dispense: 0.5 mL; Refill: 0  7. Mixed hyperlipidemia  Chronic, fair control  Continue with current medications and follow  up with cardiology - Lipid panel - [place] deaf interpreter present  Patient was given opportunity to ask questions. Patient verbalized understanding of the  plan and was able to repeat key elements of the plan. All questions were answered to their satisfaction.    Teola Bradley, FNP, have reviewed all documentation for this visit. The documentation on 07/17/20 for the exam, diagnosis, procedures, and orders are all accurate and complete.  THE PATIENT IS ENCOURAGED TO PRACTICE SOCIAL DISTANCING DUE TO THE COVID-19 PANDEMIC.

## 2020-07-17 NOTE — Patient Instructions (Signed)

## 2020-07-18 LAB — CMP14+EGFR
ALT: 34 IU/L (ref 0–44)
AST: 39 IU/L (ref 0–40)
Albumin/Globulin Ratio: 1.8 (ref 1.2–2.2)
Albumin: 4.8 g/dL (ref 3.8–4.9)
Alkaline Phosphatase: 100 IU/L (ref 44–121)
BUN/Creatinine Ratio: 20 (ref 9–20)
BUN: 19 mg/dL (ref 6–24)
Bilirubin Total: 0.7 mg/dL (ref 0.0–1.2)
CO2: 21 mmol/L (ref 20–29)
Calcium: 9.6 mg/dL (ref 8.7–10.2)
Chloride: 97 mmol/L (ref 96–106)
Creatinine, Ser: 0.95 mg/dL (ref 0.76–1.27)
GFR calc Af Amer: 105 mL/min/{1.73_m2} (ref >59)
GFR calc non Af Amer: 91 mL/min/{1.73_m2} (ref >59)
Globulin, Total: 2.6 g/dL (ref 1.5–4.5)
Glucose: 245 mg/dL — ABNORMAL HIGH (ref 65–99)
Potassium: 4.1 mmol/L (ref 3.5–5.2)
Sodium: 134 mmol/L (ref 134–144)
Total Protein: 7.4 g/dL (ref 6.0–8.5)

## 2020-07-18 LAB — LIPID PANEL
Chol/HDL Ratio: 6.5 ratio — ABNORMAL HIGH (ref 0.0–5.0)
Cholesterol, Total: 168 mg/dL (ref 100–199)
HDL: 26 mg/dL — ABNORMAL LOW (ref >39)
LDL Chol Calc (NIH): 68 mg/dL (ref 0–99)
Triglycerides: 474 mg/dL — ABNORMAL HIGH (ref 0–149)
VLDL Cholesterol Cal: 74 mg/dL — ABNORMAL HIGH (ref 5–40)

## 2020-07-18 LAB — HEMOGLOBIN A1C
Est. average glucose Bld gHb Est-mCnc: 169 mg/dL
Hgb A1c MFr Bld: 7.5 % — ABNORMAL HIGH (ref 4.8–5.6)

## 2020-07-22 ENCOUNTER — Encounter: Payer: Self-pay | Admitting: Nurse Practitioner

## 2020-07-23 ENCOUNTER — Telehealth: Payer: Self-pay | Admitting: Pharmacist

## 2020-07-23 NOTE — Chronic Care Management (AMB) (Signed)
    Chronic Care Management Pharmacy Assistant   Name: Stephen Chase  MRN: [id number] DOB: [birth year]  Reason for Encounter: Medication Review - Patient Assistance Coordination  PCP : [name], FNP  Allergies:  No Known Allergies  Medications: Outpatient Encounter Medications as of 07/23/2020  Medication Sig  . Accu-Chek Softclix Lancets lancets Use as instructed  . aspirin EC 81 MG tablet Take 81 mg by mouth daily.  Marland Kitchen atorvastatin (LIPITOR) 40 MG tablet TAKE 1 TABLET(40 MG) BY MOUTH AT BEDTIME  . Blood Glucose Monitoring Suppl (ACCU-CHEK GUIDE) w/Device KIT Use to check blood sugars three times daily E11.9  . carvedilol (COREG) 6.25 MG tablet Take 1 tablet (6.25 mg total) by mouth 2 (two) times daily with a meal.  . cholecalciferol (VITAMIN D3) 25 MCG (1000 UNIT) tablet Take 1,000 Units by mouth daily.  . Cyanocobalamin (B-12 PO) Take by mouth daily.  . cyclobenzaprine (FLEXERIL) 10 MG tablet Take 1 tablet (10 mg total) by mouth 3 (three) times daily as needed for muscle spasms.  . fenofibrate (TRICOR) 145 MG tablet TAKE 1 TABLET(145 MG) BY MOUTH DAILY  . glucose blood (ACCU-CHEK GUIDE) test strip CHECK BLOOD SUGAR 3 TIMES A DAY BEFORE BREAKFAST, LUNCH, AND DINNER  . Icosapent Ethyl (VASCEPA) 1 g CAPS One bid for elevated triglycerides  . Insulin Pen Needle (PEN NEEDLES) 32G X 4 MM MISC Inject 1 each into the skin daily.  Marland Kitchen lisinopril (ZESTRIL) 20 MG tablet Take 1 tablet (20 mg total) by mouth daily.  . Magnesium 250 MG TABS Take 1 tablet (250 mg total) by mouth every evening. With evening meals  . modafinil (PROVIGIL) 200 MG tablet TAKE 1 TABLET(200 MG) BY MOUTH DAILY  . Multiple Vitamins-Minerals (MULTIVITAMIN WITH MINERALS) tablet Take 1 tablet by mouth daily.  . niacin (NIASPAN) 500 MG CR tablet Take 2,000 mg by mouth at bedtime.   Marland Kitchen olmesartan-hydrochlorothiazide (BENICAR HCT) 40-25 MG tablet Take 1 tablet by mouth daily.   . Omega-3 Fatty Acids (FISH OIL) 1000 MG CPDR Take by  mouth. Take one tablet daily  . omeprazole (PRILOSEC) 10 MG capsule TAKE 1 CAPSULE BY MOUTH EVERY DAY BEFORE A MEAL  . OVER THE COUNTER MEDICATION Vitamin D 3 One capsule daily.  . Semaglutide, 1 MG/DOSE, (OZEMPIC, 1 MG/DOSE,) 2 MG/1.5ML SOPN Inject 1 mg into the skin once a week. Inject 36m into the skin daily  . tamsulosin (FLOMAX) 0.4 MG CAPS capsule TAKE 1 CAPSULE BY MOUTH ONCE DAILY 1/2 HOUR FOLLOWING THE SAME MEAL EACH DAY   No facility-administered encounter medications on file as of 07/23/2020.    Current Diagnosis: Patient Active Problem List   Diagnosis Date Noted  . Hypersomnia, persistent 05/09/2018  . Type 2 diabetes mellitus with diabetic neuropathy, without long-term current use of insulin ([place] 05/09/2018  . Uncontrolled type 2 diabetes mellitus with hyperglycemia ([place] 05/09/2018  . OSA on CPAP 05/09/2018  . Esophageal varices without bleeding ([place] 05/09/2018  . Bilateral deafness 05/09/2018      Follow-Up:  Patient Assistance Coordination - New PAP [facility] for 2022 [facility] Awaiting providers signature and patients signature and proof of income to fax.   Christian Davis,CPP. Notified  VJudithann Sheen CArbucklePharmacist Assistant [phone]

## 2020-07-29 LAB — HM DIABETES EYE EXAM

## 2020-07-30 DIAGNOSIS — E119 Type 2 diabetes mellitus without complications: Secondary | ICD-10-CM | POA: Diagnosis not present

## 2020-07-30 DIAGNOSIS — H524 Presbyopia: Secondary | ICD-10-CM | POA: Diagnosis not present

## 2020-08-12 ENCOUNTER — Other Ambulatory Visit: Payer: Self-pay

## 2020-08-12 DIAGNOSIS — E1140 Type 2 diabetes mellitus with diabetic neuropathy, unspecified: Secondary | ICD-10-CM

## 2020-08-12 MED ORDER — ACCU-CHEK GUIDE VI STRP: ORAL_STRIP | 2 refills | Status: DC

## 2020-08-13 ENCOUNTER — Other Ambulatory Visit: Payer: Self-pay

## 2020-08-13 DIAGNOSIS — E1140 Type 2 diabetes mellitus with diabetic neuropathy, unspecified: Secondary | ICD-10-CM

## 2020-08-13 MED ORDER — ACCU-CHEK GUIDE VI STRP: ORAL_STRIP | 2 refills | Status: DC

## 2020-08-25 ENCOUNTER — Other Ambulatory Visit: Payer: Self-pay | Admitting: Nurse Practitioner

## 2020-08-30 ENCOUNTER — Other Ambulatory Visit: Payer: Self-pay | Admitting: Nurse Practitioner

## 2020-08-30 DIAGNOSIS — G4733 Obstructive sleep apnea (adult) (pediatric): Secondary | ICD-10-CM | POA: Diagnosis not present

## 2020-09-20 ENCOUNTER — Other Ambulatory Visit: Payer: Self-pay | Admitting: Nurse Practitioner

## 2020-09-23 ENCOUNTER — Telehealth: Payer: Self-pay

## 2020-09-23 NOTE — Progress Notes (Signed)
    Chronic Care Management Pharmacy Assistant   Name: Stephen Chase  MRN: [id number] DOB: 04-30-1966  Reason for Encounter: Medication Review   PCP : [name], FNP  Allergies:  No Known Allergies  Medications: Outpatient Encounter Medications as of 09/23/2020  Medication Sig  . Accu-Chek Softclix Lancets lancets Use as instructed  . aspirin EC 81 MG tablet Take 81 mg by mouth daily.  Marland Kitchen atorvastatin (LIPITOR) 40 MG tablet TAKE 1 TABLET(40 MG) BY MOUTH AT BEDTIME  . Blood Glucose Monitoring Suppl (ACCU-CHEK GUIDE) w/Device KIT Use to check blood sugars three times daily E11.9  . carvedilol (COREG) 6.25 MG tablet TAKE 1 TABLET(6.25 MG) BY MOUTH TWICE DAILY WITH A MEAL  . cholecalciferol (VITAMIN D3) 25 MCG (1000 UNIT) tablet Take 1,000 Units by mouth daily.  . Cyanocobalamin (B-12 PO) Take by mouth daily.  . cyclobenzaprine (FLEXERIL) 10 MG tablet Take 1 tablet (10 mg total) by mouth 3 (three) times daily as needed for muscle spasms.  . fenofibrate (TRICOR) 145 MG tablet TAKE 1 TABLET(145 MG) BY MOUTH DAILY  . glucose blood (ACCU-CHEK GUIDE) test strip CHECK BLOOD SUGAR 3 TIMES A DAY BEFORE BREAKFAST, LUNCH, AND DINNER  . Icosapent Ethyl (VASCEPA) 1 g CAPS One bid for elevated triglycerides  . Insulin Pen Needle (PEN NEEDLES) 32G X 4 MM MISC Inject 1 each into the skin daily.  Marland Kitchen lisinopril (ZESTRIL) 20 MG tablet Take 1 tablet (20 mg total) by mouth daily.  . Magnesium 250 MG TABS Take 1 tablet (250 mg total) by mouth every evening. With evening meals  . modafinil (PROVIGIL) 200 MG tablet TAKE 1 TABLET(200 MG) BY MOUTH DAILY  . Multiple Vitamins-Minerals (MULTIVITAMIN WITH MINERALS) tablet Take 1 tablet by mouth daily.  . niacin (NIASPAN) 500 MG CR tablet Take 2,000 mg by mouth at bedtime.   Marland Kitchen olmesartan-hydrochlorothiazide (BENICAR HCT) 40-25 MG tablet Take 1 tablet by mouth daily.   . Omega-3 Fatty Acids (FISH OIL) 1000 MG CPDR Take by mouth. Take one tablet daily  . omeprazole  (PRILOSEC) 10 MG capsule TAKE 1 CAPSULE BY MOUTH EVERY DAY BEFORE A MEAL  . OVER THE COUNTER MEDICATION Vitamin D 3 One capsule daily.  . Semaglutide, 1 MG/DOSE, (OZEMPIC, 1 MG/DOSE,) 2 MG/1.5ML SOPN Inject 1 mg into the skin once a week. Inject $RemoveBefo'1mg'uxEerVIbshT$  into the skin daily  . tamsulosin (FLOMAX) 0.4 MG CAPS capsule TAKE 1 CAPSULE BY MOUTH EVERY DAY 30 MINUTES AFTER THE SAME MEAL   No facility-administered encounter medications on file as of 09/23/2020.    Current Diagnosis: Patient Active Problem List   Diagnosis Date Noted  . Hypersomnia, persistent 05/09/2018  . Type 2 diabetes mellitus with diabetic neuropathy, without long-term current use of insulin ([place]) 05/09/2018  . Uncontrolled type 2 diabetes mellitus with hyperglycemia ([place]) 05/09/2018  . OSA on CPAP 05/09/2018  . Esophageal varices without bleeding ([place]) 05/09/2018  . Bilateral deafness 05/09/2018     Follow-Up:  Patient Assistance Coordination - Called patient to see when he can go by PCP office to sign patient assistance form and to bring proof of income. Patient will be coming by PCP to sign application on 5/80/9983. Per interpreter.  [name], CPP notified.   [name], [facility] Clinical Pharmacist Assistant [phone]

## 2020-09-26 ENCOUNTER — Telehealth: Payer: Self-pay | Admitting: *Deleted

## 2020-09-26 NOTE — Telephone Encounter (Signed)
Submitted PA modafinil on CMM. Key: [id number]. Received instant approval: PA Case: [id number], Status: Approved, Coverage Starts on: 09/21/2020 12:00:00 AM, Coverage Ends on: 09/20/2021 12:00:00 AM. Questions? Contact [phone].

## 2020-10-02 ENCOUNTER — Other Ambulatory Visit: Payer: Self-pay

## 2020-10-02 DIAGNOSIS — M791 Myalgia, unspecified site: Secondary | ICD-10-CM

## 2020-10-02 DIAGNOSIS — R519 Headache, unspecified: Secondary | ICD-10-CM

## 2020-10-02 DIAGNOSIS — E1140 Type 2 diabetes mellitus with diabetic neuropathy, unspecified: Secondary | ICD-10-CM

## 2020-10-02 MED ORDER — LISINOPRIL 20 MG PO TABS: 20.0000 mg | ORAL_TABLET | Freq: Every day | ORAL | 1 refills | Status: DC

## 2020-10-02 MED ORDER — OLMESARTAN MEDOXOMIL-HCTZ 40-25 MG PO TABS: 1.0000 | ORAL_TABLET | Freq: Every day | ORAL | 1 refills | Status: DC

## 2020-10-02 MED ORDER — TAMSULOSIN HCL 0.4 MG PO CAPS: ORAL_CAPSULE | ORAL | 1 refills | Status: DC

## 2020-10-02 MED ORDER — CYCLOBENZAPRINE HCL 10 MG PO TABS: 10.0000 mg | ORAL_TABLET | Freq: Three times a day (TID) | ORAL | 0 refills | Status: DC | PRN

## 2020-10-02 MED ORDER — OMEPRAZOLE 10 MG PO CPDR
10.0000 mg | DELAYED_RELEASE_CAPSULE | Freq: Every day | ORAL | 1 refills | Status: DC
Start: 2020-10-02 — End: 2021-10-07

## 2020-10-04 ENCOUNTER — Telehealth: Payer: Self-pay | Admitting: Neurology

## 2020-10-04 MED ORDER — MODAFINIL 200 MG PO TABS: ORAL_TABLET | ORAL | 0 refills | Status: DC

## 2020-10-04 NOTE — Telephone Encounter (Signed)
Patient requested refill through after our call service for modafinil.  1 month prescription renewed and sent to pharmacy on file.  Please review for further refills, appointment pending this month.

## 2020-10-09 ENCOUNTER — Encounter: Payer: Self-pay | Admitting: Adult Health

## 2020-10-09 ENCOUNTER — Ambulatory Visit: Payer: Medicare PPO | Admitting: Adult Health

## 2020-10-09 VITALS — BP 111/64 | HR 86 | Ht 69.0 in | Wt 246.0 lb

## 2020-10-09 DIAGNOSIS — Z9989 Dependence on other enabling machines and devices: Secondary | ICD-10-CM

## 2020-10-09 DIAGNOSIS — G4733 Obstructive sleep apnea (adult) (pediatric): Secondary | ICD-10-CM

## 2020-10-09 NOTE — Patient Instructions (Signed)
Continue using CPAP nightly and greater than 4 hours each night °If your symptoms worsen or you develop new symptoms please let us know.  ° °

## 2020-10-09 NOTE — Progress Notes (Signed)
PATIENT: Stephen Chase DOB: 08-28-1966  REASON FOR VISIT: follow up HISTORY FROM: patient, sign language interpreter present  HISTORY OF PRESENT ILLNESS: Today 10/09/20:  Stephen Chase is a 55 year old male with a history of obstructive sleep apnea on CPAP.  Patient reports that he tolerates the CPAP well.  He does find it beneficial.  Denies any new issues.  His CPAP report is indicates:  Compliance Report Usage 09/09/2020 - 10/08/2020 Usage days 30/30 days (100%) >= 4 hours 27 days (90%) Average usage (days used) 5 hours 50 minutes  AirSense 10 AutoSet Serial number [id number] Mode AutoSet Min Pressure 6 cmH2O Max Pressure 19 cmH2O EPR Fulltime EPR level 3  Therapy Pressure - cmH2O Median: 10.0 95th percentile: 14.5 Maximum: 16.3 Leaks - L/min Median: 0.0 95th percentile: 2.1 Maximum: 32.8 Events per hour AI: 1.8 HI: 0.9 AHI: 2.7 Apnea Index Central: 0.2 Obstructive: 1.6 Unknown: 0.0   10/04/19: Stephen Chase is a 55 year old male with a history of obstructive sleep apnea on CPAP.  His download indicates that he uses machine 29 out of 30 days for compliance of 97%.  He uses machine greater than 4 hours 25 days for compliance of 83%.  On average he uses his machine 5 hours and 59 minutes.  His residual AHI is 2.7 on 6 to 19 cm of water with EPR of 3.  His leak in the 95th percentile is 6.6 L/min.  He states that he continues to take modafinil.  He reports that this offers him good benefit.  He states that he was confused about Flomax and was taking it during the day and this was causing daytime sleepiness.  Now that he is taking it at bedtime his sleepiness has improved.  He returns today for an evaluation.  HISTORY (Copied from Dr.Dohmeier's note) Jakwon Gayton is a 55 y.o. male , seen here as in a referral  from [place] for a re- evaluation of sleep apnea,in the presence of his sign Ecologist.  09-28-2018, follow up on CPAP titration and compliance.  Stephen Chase was  evaluated by a home sleep study dated 08 June 2018 by watch pat device.  The total AHI was 16.6/h there was mild to moderate snoring noticed, oxygen nadir was 89%, average heart rate was 56 bpm.  The patient had already been on CPAP before and we continued CPAP treatment with an auto titration device.  I have the opportunity to see the excellent compliance the patient has used the machine 30 out of 30 days and 26 of those days over 4 hours.  Average use of time is 5 hours 58 minutes each night the AutoSet has a pressure window between 6 cmH2O and 19 cm water pressure with 3 cm expiratory pressure relief.  The 95th percentile pressure is 16 cmH2O with an AHI of 6.2 this seems to be related to unknown events at 2.5/h and I am not quite sure where they come from.   He does not have central apneas and he has very minimal air leakage.  I noticed that only 3 out of 30 nights had a very high AHI and this may have been a artifactual count the general average AHI is truly under 2/h.   REVIEW OF SYSTEMS: Out of a complete 14 system review of symptoms, the patient complains only of the following symptoms, and all other reviewed systems are negative.  Epworth sleepiness score 15 Fatigue severity score 36  ALLERGIES: No Known Allergies  HOME MEDICATIONS: Outpatient  Medications Prior to Visit  Medication Sig Dispense Refill  . Accu-Chek Softclix Lancets lancets Use as instructed 200 each 3  . aspirin EC 81 MG tablet Take 81 mg by mouth daily.    Marland Kitchen atorvastatin (LIPITOR) 40 MG tablet TAKE 1 TABLET(40 MG) BY MOUTH AT BEDTIME 90 tablet 1  . Blood Glucose Monitoring Suppl (ACCU-CHEK GUIDE) w/Device KIT Use to check blood sugars three times daily E11.9 1 kit 0  . carvedilol (COREG) 6.25 MG tablet TAKE 1 TABLET(6.25 MG) BY MOUTH TWICE DAILY WITH A MEAL 180 tablet 1  . cholecalciferol (VITAMIN D3) 25 MCG (1000 UNIT) tablet Take 1,000 Units by mouth daily.    . Cyanocobalamin (B-12 PO) Take by mouth daily.     . cyclobenzaprine (FLEXERIL) 10 MG tablet Take 1 tablet (10 mg total) by mouth 3 (three) times daily as needed for muscle spasms. 30 tablet 0  . fenofibrate (TRICOR) 145 MG tablet TAKE 1 TABLET(145 MG) BY MOUTH DAILY 90 tablet 1  . glucose blood (ACCU-CHEK GUIDE) test strip CHECK BLOOD SUGAR 3 TIMES A DAY BEFORE BREAKFAST, LUNCH, AND DINNER 150 strip 2  . Icosapent Ethyl (VASCEPA) 1 g CAPS One bid for elevated triglycerides 180 capsule 0  . Insulin Pen Needle (PEN NEEDLES) 32G X 4 MM MISC Inject 1 each into the skin daily. 30 each 2  . lisinopril (ZESTRIL) 20 MG tablet Take 1 tablet (20 mg total) by mouth daily. 90 tablet 1  . Magnesium 250 MG TABS Take 1 tablet (250 mg total) by mouth every evening. With evening meals 30 tablet 2  . modafinil (PROVIGIL) 200 MG tablet TAKE 1 TABLET(200 MG) BY MOUTH DAILY 30 tablet 0  . Multiple Vitamins-Minerals (MULTIVITAMIN WITH MINERALS) tablet Take 1 tablet by mouth daily.    . niacin (NIASPAN) 500 MG CR tablet Take 2,000 mg by mouth at bedtime.     Marland Kitchen olmesartan-hydrochlorothiazide (BENICAR HCT) 40-25 MG tablet Take 1 tablet by mouth daily. 90 tablet 1  . Omega-3 Fatty Acids (FISH OIL) 1000 MG CPDR Take by mouth. Take one tablet daily    . omeprazole (PRILOSEC) 10 MG capsule Take 1 capsule (10 mg total) by mouth daily. 90 capsule 1  . OVER THE COUNTER MEDICATION Vitamin D 3 One capsule daily.    . Semaglutide, 1 MG/DOSE, (OZEMPIC, 1 MG/DOSE,) 2 MG/1.5ML SOPN Inject 1 mg into the skin once a week. Inject $RemoveBefo'1mg'lsklyryasSg$  into the skin daily    . tamsulosin (FLOMAX) 0.4 MG CAPS capsule TAKE 1 CAPSULE BY MOUTH EVERY DAY 30 MINUTES AFTER THE SAME MEAL 90 capsule 1   No facility-administered medications prior to visit.    PAST MEDICAL HISTORY: Past Medical History:  Diagnosis Date  . Allergy   . Arthritis    SHOULDERS  . Balanitis   . Deafness, sensorineural, childhood onset    BILATERAL SINCE AGE 46  SECONDARY TO UNKNOWN ILLNESS  . GERD (gastroesophageal reflux  disease)   . Hyperlipidemia   . Hypertension   . No blood products    Patient is Jehovah Witness no blood products.  . OSA on CPAP    SEVERE PER STUDY 03-09-2009  . Sleep apnea    uses a cpap  . Smokers' cough ([place])   . Type 2 diabetes mellitus ([place])     PAST SURGICAL HISTORY: Past Surgical History:  Procedure Laterality Date  . CIRCUMCISION N/A 12/19/2013   Procedure: CIRCUMCISION ADULT;  Surgeon: Lowella Bandy, MD;  Location: [facility];  Service: Urology;  Laterality: N/A;  . SHOULDER SURGERY Right   . WRIST SURGERY Left     FAMILY HISTORY: Family History  Problem Relation Age of Onset  . Cancer Mother        type unknown pt was 38 years old at that time  . Liver disease Father   . Healthy Brother   . Cancer Other         alot of family members have cancer but doesnt know the type  . Colon cancer Neg Hx   . Esophageal cancer Neg Hx   . Rectal cancer Neg Hx   . Stomach cancer Neg Hx     SOCIAL HISTORY: Social History   Socioeconomic History  . Marital status: Married    Spouse name: Not on file  . Number of children: 2  . Years of education: Not on file  . Highest education level: Not on file  Occupational History  . Occupation: unemployed  Tobacco Use  . Smoking status: Former Smoker    Packs/day: 1.00    Years: 30.00    Pack years: 30.00    Types: Cigarettes  . Smokeless tobacco: Never Used  . Tobacco comment: quit 4 years ago  Vaping Use  . Vaping Use: Never used  Substance and Sexual Activity  . Alcohol use: Not Currently    Comment: rarely  . Drug use: No  . Sexual activity: Yes  Other Topics Concern  . Not on file  Social History Narrative  . Not on file   Social Determinants of Health   Financial Resource Strain: Medium Risk  . Difficulty of Paying Living Expenses: Somewhat hard  Food Insecurity: No Food Insecurity  . Worried About Charity fundraiser in the Last Year: Never true  . Ran Out of Food in the Last Year: Never  true  Transportation Needs: No Transportation Needs  . Lack of Transportation (Medical): No  . Lack of Transportation (Non-Medical): No  Physical Activity: Inactive  . Days of Exercise per Week: 0 days  . Minutes of Exercise per Session: 0 min  Stress: No Stress Concern Present  . Feeling of Stress : Not at all  Social Connections: Not on file  Intimate Partner Violence: Not on file      PHYSICAL EXAM  Vitals:   10/09/20 1036  Weight: 246 lb (111.6 kg)  Height: $Remove'5\' 9"'nKpFAvj$  (1.753 m)   Body mass index is 36.33 kg/m.  Generalized: Well developed, in no acute distress  Chest: Lungs clear to auscultation bilaterally  Neurological examination  Mentation: Alert oriented to time, place, history taking. Follows all commands speech and language fluent Cranial nerve II-XII: Extraocular movements were full, visual field were full on confrontational test Head turning and shoulder shrug  were normal and symmetric. Motor: The motor testing reveals 5 over 5 strength of all 4 extremities. Good symmetric motor tone is noted throughout.  Sensory: Sensory testing is intact to soft touch on all 4 extremities. No evidence of extinction is noted.  Gait and station: Gait is normal.    DIAGNOSTIC DATA (LABS, IMAGING, TESTING) - I reviewed patient records, labs, notes, testing and imaging myself where available.  Lab Results  Component Value Date   WBC 6.1 02/22/2020   HGB 15.1 02/22/2020   HCT 44.0 02/22/2020   MCV 89 02/22/2020   PLT 158 02/22/2020      Component Value Date/Time   NA 134 07/17/2020 1019   K 4.1 07/17/2020 1019  CL 97 07/17/2020 1019   CO2 21 07/17/2020 1019   GLUCOSE 245 (H) 07/17/2020 1019   GLUCOSE 126 (H) 12/19/2013 0937   BUN 19 07/17/2020 1019   CREATININE 0.95 07/17/2020 1019   CALCIUM 9.6 07/17/2020 1019   PROT 7.4 07/17/2020 1019   ALBUMIN 4.8 07/17/2020 1019   AST 39 07/17/2020 1019   ALT 34 07/17/2020 1019   ALKPHOS 100 07/17/2020 1019   BILITOT 0.7  07/17/2020 1019   GFRNONAA 91 07/17/2020 1019   GFRAA 105 07/17/2020 1019   Lab Results  Component Value Date   CHOL 168 07/17/2020   HDL 26 (L) 07/17/2020   LDLCALC 68 07/17/2020   TRIG 474 (H) 07/17/2020   CHOLHDL 6.5 (H) 07/17/2020   Lab Results  Component Value Date   HGBA1C 7.5 (H) 07/17/2020   No results found for: [id number] Lab Results  Component Value Date   TSH 1.810 03/09/2019      ASSESSMENT AND PLAN 55 y.o. year old male  has a past medical history of Allergy, Arthritis, Balanitis, Deafness, sensorineural, childhood onset, GERD (gastroesophageal reflux disease), Hyperlipidemia, Hypertension, No blood products, OSA on CPAP, Sleep apnea, Smokers' cough ([place]), and Type 2 diabetes mellitus ([place]). here with:  1. Obstructive sleep apnea on CPAP 2. Hypersomnia  The patient's CPAP download shows excellent compliance and good treatment of his apnea.  He is encouraged to continue using CPAP nightly and greater than 4 hours each night.  Continue on Provigil 200 mg daily. he is advised that if his symptoms worsen or he develops new symptoms he should let us know.  He will follow-up in 1 year or sooner if needed  I spent 25 minutes of face-to-face and non-face-to-face time with patient.  This included previsit chart review, lab review, study review, order entry, electronic health record documentation, patient education.    Ward Givens, MSN, NP-C 10/09/2020, 10:39 AM [facility] [address], [place],  [address] [phone]

## 2020-10-11 ENCOUNTER — Other Ambulatory Visit: Payer: Self-pay

## 2020-10-11 DIAGNOSIS — E1140 Type 2 diabetes mellitus with diabetic neuropathy, unspecified: Secondary | ICD-10-CM

## 2020-10-11 MED ORDER — ACCU-CHEK GUIDE VI STRP: ORAL_STRIP | 2 refills | Status: DC

## 2020-10-17 ENCOUNTER — Encounter: Payer: Self-pay | Admitting: Nurse Practitioner

## 2020-10-17 ENCOUNTER — Other Ambulatory Visit: Payer: Self-pay

## 2020-10-17 ENCOUNTER — Ambulatory Visit: Payer: Medicare PPO | Admitting: Nurse Practitioner

## 2020-10-17 VITALS — BP 116/72 | HR 80 | Temp 98.1°F | Ht 69.0 in | Wt 238.6 lb

## 2020-10-17 DIAGNOSIS — G8929 Other chronic pain: Secondary | ICD-10-CM

## 2020-10-17 DIAGNOSIS — I10 Essential (primary) hypertension: Secondary | ICD-10-CM

## 2020-10-17 DIAGNOSIS — M545 Low back pain, unspecified: Secondary | ICD-10-CM | POA: Diagnosis not present

## 2020-10-17 DIAGNOSIS — E1140 Type 2 diabetes mellitus with diabetic neuropathy, unspecified: Secondary | ICD-10-CM

## 2020-10-17 DIAGNOSIS — Z6835 Body mass index (BMI) 35.0-35.9, adult: Secondary | ICD-10-CM | POA: Diagnosis not present

## 2020-10-17 DIAGNOSIS — E782 Mixed hyperlipidemia: Secondary | ICD-10-CM

## 2020-10-17 NOTE — Progress Notes (Signed)
I,Tianna Badgett,acting as a Education administrator for [employer], NP.,have documented all relevant documentation on the behalf of [employer], NP,as directed by  Bary Castilla, NP while in the presence of Bary Castilla, NP.  This visit occurred during the SARS-CoV-2 public health emergency.  Safety protocols were in place, including screening questions prior to the visit, additional usage of staff PPE, and extensive cleaning of exam room while observing appropriate contact time as indicated for disinfecting solutions.  Subjective:     Patient ID: Stephen Chase , male    DOB: Feb 07, 1966 , 55 y.o.   MRN: [id number]   Chief Complaint  Patient presents with  . Diabetes  . Hypertension    HPI  Patient here for a dm/htn f/u. He has no concerns at this time. He is seeing orthopedic for his back pain.  BS at home: averaging about 160-180. He is trying to eat better. He doesn't eat out much. Trying to eat better.   Sign language interpreter Cecille Rubin is present.  Diabetes He presents for his follow-up diabetic visit. He has type 2 diabetes mellitus. His disease course has been stable (not much improved). Pertinent negatives for hypoglycemia include no dizziness or headaches. Pertinent negatives for diabetes include no chest pain, no polydipsia, no polyphagia and no polyuria. There are no hypoglycemic complications. Symptoms are stable. There are no diabetic complications. Risk factors for coronary artery disease include obesity and sedentary lifestyle. He is following a generally unhealthy diet. When asked about meal planning, he reported none. He has not had a previous visit with a dietitian. He participates in exercise intermittently (he is walking alot and working a strenuous job.). (Average blood sugar 175.  ) Eye exam is current.  Hypertension This is a chronic problem. The current episode started more than 1 year ago. The problem is controlled. Pertinent negatives include no chest pain, headaches  or palpitations. There are no associated agents to hypertension. Risk factors for coronary artery disease include obesity, sedentary lifestyle, dyslipidemia, diabetes mellitus and male gender. Past treatments include ACE inhibitors. There are no compliance problems.  There is no history of chronic renal disease.     Past Medical History:  Diagnosis Date  . Allergy   . Arthritis    SHOULDERS  . Balanitis   . Deafness, sensorineural, childhood onset    BILATERAL SINCE AGE 89  SECONDARY TO UNKNOWN ILLNESS  . GERD (gastroesophageal reflux disease)   . Hyperlipidemia   . Hypertension   . No blood products    Patient is Jehovah Witness no blood products.  . OSA on CPAP    SEVERE PER STUDY 03-09-2009  . Sleep apnea    uses a cpap  . Smokers' cough ([place])   . Type 2 diabetes mellitus (HCC)      Family History  Problem Relation Age of Onset  . Cancer Mother        type unknown pt was 26 years old at that time  . Liver disease Father   . Healthy Brother   . Cancer Other         alot of family members have cancer but doesnt know the type  . Colon cancer Neg Hx   . Esophageal cancer Neg Hx   . Rectal cancer Neg Hx   . Stomach cancer Neg Hx      Current Outpatient Medications:  .  Accu-Chek Softclix Lancets lancets, Use as instructed, Disp: 200 each, Rfl: 3 .  aspirin EC 81 MG tablet, Take 81  mg by mouth daily., Disp: , Rfl:  .  atorvastatin (LIPITOR) 40 MG tablet, TAKE 1 TABLET(40 MG) BY MOUTH AT BEDTIME, Disp: 90 tablet, Rfl: 1 .  Blood Glucose Monitoring Suppl (ACCU-CHEK GUIDE) w/Device KIT, Use to check blood sugars three times daily E11.9, Disp: 1 kit, Rfl: 0 .  carvedilol (COREG) 6.25 MG tablet, TAKE 1 TABLET(6.25 MG) BY MOUTH TWICE DAILY WITH A MEAL, Disp: 180 tablet, Rfl: 1 .  cholecalciferol (VITAMIN D3) 25 MCG (1000 UNIT) tablet, Take 1,000 Units by mouth daily., Disp: , Rfl:  .  Cyanocobalamin (B-12 PO), Take by mouth daily., Disp: , Rfl:  .  cyclobenzaprine (FLEXERIL) 10  MG tablet, Take 1 tablet (10 mg total) by mouth 3 (three) times daily as needed for muscle spasms., Disp: 30 tablet, Rfl: 0 .  fenofibrate (TRICOR) 145 MG tablet, TAKE 1 TABLET(145 MG) BY MOUTH DAILY, Disp: 90 tablet, Rfl: 1 .  glucose blood (ACCU-CHEK GUIDE) test strip, CHECK BLOOD SUGAR 3 TIMES A DAY BEFORE BREAKFAST, LUNCH, AND DINNER, Disp: 150 strip, Rfl: 2 .  Icosapent Ethyl (VASCEPA) 1 g CAPS, One bid for elevated triglycerides, Disp: 180 capsule, Rfl: 0 .  Insulin Pen Needle (PEN NEEDLES) 32G X 4 MM MISC, Inject 1 each into the skin daily., Disp: 30 each, Rfl: 2 .  lisinopril (ZESTRIL) 20 MG tablet, Take 1 tablet (20 mg total) by mouth daily., Disp: 90 tablet, Rfl: 1 .  Magnesium 250 MG TABS, Take 1 tablet (250 mg total) by mouth every evening. With evening meals, Disp: 30 tablet, Rfl: 2 .  modafinil (PROVIGIL) 200 MG tablet, TAKE 1 TABLET(200 MG) BY MOUTH DAILY, Disp: 30 tablet, Rfl: 0 .  Multiple Vitamins-Minerals (MULTIVITAMIN WITH MINERALS) tablet, Take 1 tablet by mouth daily., Disp: , Rfl:  .  niacin (NIASPAN) 500 MG CR tablet, Take 2,000 mg by mouth at bedtime. , Disp: , Rfl:  .  olmesartan-hydrochlorothiazide (BENICAR HCT) 40-25 MG tablet, Take 1 tablet by mouth daily., Disp: 90 tablet, Rfl: 1 .  Omega-3 Fatty Acids (FISH OIL) 1000 MG CPDR, Take by mouth. Take one tablet daily, Disp: , Rfl:  .  omeprazole (PRILOSEC) 10 MG capsule, Take 1 capsule (10 mg total) by mouth daily., Disp: 90 capsule, Rfl: 1 .  OVER THE COUNTER MEDICATION, Vitamin D 3 One capsule daily., Disp: , Rfl:  .  Semaglutide, 1 MG/DOSE, (OZEMPIC, 1 MG/DOSE,) 2 MG/1.5ML SOPN, Inject 1 mg into the skin once a week. Inject $RemoveBefo'1mg'HglRqaNHTZl$  into the skin daily, Disp: , Rfl:  .  tamsulosin (FLOMAX) 0.4 MG CAPS capsule, TAKE 1 CAPSULE BY MOUTH EVERY DAY 30 MINUTES AFTER THE SAME MEAL, Disp: 90 capsule, Rfl: 1   No Known Allergies   Review of Systems  Constitutional: Negative.   HENT: Negative for sinus pressure and sinus pain.    Respiratory: Negative.   Cardiovascular: Negative.  Negative for chest pain and palpitations.  Gastrointestinal: Negative.   Endocrine: Negative for polydipsia, polyphagia and polyuria.  Musculoskeletal: Negative for myalgias.  Neurological: Negative.  Negative for dizziness, numbness and headaches.     Today's Vitals   10/17/20 1027  BP: 116/72  Pulse: 80  Temp: 98.1 F (36.7 C)  TempSrc: Oral  Weight: 238 lb 9.6 oz (108.2 kg)  Height: $Remove'5\' 9"'hzLgQRn$  (1.753 m)   Body mass index is 35.24 kg/m.  BP Readings from Last 3 Encounters:  10/17/20 116/72  10/09/20 111/64  07/17/20 124/74    Objective:  Physical Exam Vitals reviewed.  Constitutional:  Appearance: Normal appearance. He is obese.  HENT:     Head: Normocephalic and atraumatic.  Eyes:     Pupils: Pupils are equal, round, and reactive to light.  Cardiovascular:     Rate and Rhythm: Normal rate and regular rhythm.     Pulses: Normal pulses.     Heart sounds: Normal heart sounds.  Pulmonary:     Effort: Pulmonary effort is normal. No respiratory distress.     Breath sounds: Normal breath sounds. No wheezing.  Neurological:     Mental Status: He is alert.  Psychiatric:        Mood and Affect: Mood normal.        Behavior: Behavior normal.        Thought Content: Thought content normal.        Judgment: Judgment normal.         Assessment And Plan:     1. Essential hypertension -Chronic, Stable  -Continue meds  - CMP14+EGFR -Limit the intake of processed foods and salt intake. You should increase your intake of green vegetables and fruits. Limit the use of alcohol. Limit fast foods and fried foods. Avoid high fatty saturated and trans fat foods. Keep yourself hydrated with drinking water. Avoid red meats. Eat lean meats instead. Exercise for atleast 30-45 min for atleast 4-5 times a week.   2. Type 2 diabetes mellitus with diabetic neuropathy, without long-term current use of insulin (HCC) - Hemoglobin A1c -  CMP14+EGFR -Continue meds   -educated patient about eating healthy and to cut out sugary drinks and foods.    3. Mixed hyperlipidemia Chronic, stable -Continue meds  - Lipid panel -Educated patient on avoiding high fatty foods and dairy products, fast food, increase intake of fish, lean meats.   4. Chronic midline low back pain without sciatica - Patient already sees a ortho  -Patient participates in physical therapy  -Patient educated about resting, using Salonpas patches (given samples), doing light exercises and stretches for his back.   5. Class 2 severe obesity due to excess calories with serious comorbidity and body mass index (BMI) of 35.0 to 35.9 in adult [facility])  -Staying healthy and adopting a healthy lifestyle for your overall health is important. You should eat 7 or more servings of fruits and vegetables per day. You should drink plenty of water to keep yourself hydrated and your kidneys healthy. This includes about 65-80+ fluid ounces of water. Limit your intake of animal fats especially for elevated cholesterol. Avoid highly processed food and limit your salt intake if you have hypertension. Avoid foods high in saturated/Trans fats. Along with a healthy diet it is also very important to maintain time for yourself to maintain a healthy mental health with low stress levels. You should get atleast 150 min of moderate intensity exercise weekly for a healthy heart. Along with eating right and exercising, aim for at least 7-9 hours of sleep daily.  Eat more whole grains which includes barley, wheat berries, oats, brown rice and whole wheat pasta. Use healthy plant oils which include olive, soy, corn, sunflower and peanut. Limit your caffeine and sugary drinks. Limit your intake of fast foods. Limit milk and dairy products to one or two daily servings.   Patient was given opportunity to ask questions. Patient verbalized understanding of the plan and was able to repeat key elements of the  plan. All questions were answered to their satisfaction.  Bary Castilla, NP   I, Bary Castilla, NP, have reviewed all  documentation for this visit. The documentation on 10/17/20 for the exam, diagnosis, procedures, and orders are all accurate and complete.  THE PATIENT IS ENCOURAGED TO PRACTICE SOCIAL DISTANCING DUE TO THE COVID-19 PANDEMIC.

## 2020-10-17 NOTE — Patient Instructions (Signed)
Diabetes Mellitus and Nutrition, Adult When you have diabetes, or diabetes mellitus, it is very important to have healthy eating habits because your blood sugar (glucose) levels are greatly affected by what you eat and drink. Eating healthy foods in the right amounts, at about the same times every day, can help you:  Control your blood glucose.  Lower your risk of heart disease.  Improve your blood pressure.  Reach or maintain a healthy weight. What can affect my meal plan? Every person with diabetes is different, and each person has different needs for a meal plan. Your health care provider may recommend that you work with a dietitian to make a meal plan that is best for you. Your meal plan may vary depending on factors such as:  The calories you need.  The medicines you take.  Your weight.  Your blood glucose, blood pressure, and cholesterol levels.  Your activity level.  Other health conditions you have, such as heart or kidney disease. How do carbohydrates affect me? Carbohydrates, also called carbs, affect your blood glucose level more than any other type of food. Eating carbs naturally raises the amount of glucose in your blood. Carb counting is a method for keeping track of how many carbs you eat. Counting carbs is important to keep your blood glucose at a healthy level, especially if you use insulin or take certain oral diabetes medicines. It is important to know how many carbs you can safely have in each meal. This is different for every person. Your dietitian can help you calculate how many carbs you should have at each meal and for each snack. How does alcohol affect me? Alcohol can cause a sudden decrease in blood glucose (hypoglycemia), especially if you use insulin or take certain oral diabetes medicines. Hypoglycemia can be a life-threatening condition. Symptoms of hypoglycemia, such as sleepiness, dizziness, and confusion, are similar to symptoms of having too much  alcohol.  Do not drink alcohol if: ? Your health care provider tells you not to drink. ? You are pregnant, may be pregnant, or are planning to become pregnant.  If you drink alcohol: ? Do not drink on an empty stomach. ? Limit how much you use to:  0-1 drink a day for women.  0-2 drinks a day for men. ? Be aware of how much alcohol is in your drink. In the [place] one drink equals one 12 oz bottle of beer (355 mL), one 5 oz glass of wine (148 mL), or one 1 oz glass of hard liquor (44 mL). ? Keep yourself hydrated with water, diet soda, or unsweetened iced tea.  Keep in mind that regular soda, juice, and other mixers may contain a lot of sugar and must be counted as carbs. What are tips for following this plan? Reading food labels  Start by checking the serving size on the "Nutrition Facts" label of packaged foods and drinks. The amount of calories, carbs, fats, and other nutrients listed on the label is based on one serving of the item. Many items contain more than one serving per package.  Check the total grams (g) of carbs in one serving. You can calculate the number of servings of carbs in one serving by dividing the total carbs by 15. For example, if a food has 30 g of total carbs per serving, it would be equal to 2 servings of carbs.  Check the number of grams (g) of saturated fats and trans fats in one serving. Choose foods that have   a low amount or none of these fats.  Check the number of milligrams (mg) of salt (sodium) in one serving. Most people should limit total sodium intake to less than 2,300 mg per day.  Always check the nutrition information of foods labeled as "low-fat" or "nonfat." These foods may be higher in added sugar or refined carbs and should be avoided.  Talk to your dietitian to identify your daily goals for nutrients listed on the label. Shopping  Avoid buying canned, pre-made, or processed foods. These foods tend to be high in fat, sodium, and added  sugar.  Shop around the outside edge of the grocery store. This is where you will most often find fresh fruits and vegetables, bulk grains, fresh meats, and fresh dairy. Cooking  Use low-heat cooking methods, such as baking, instead of high-heat cooking methods like deep frying.  Cook using healthy oils, such as olive, canola, or sunflower oil.  Avoid cooking with butter, cream, or high-fat meats. Meal planning  Eat meals and snacks regularly, preferably at the same times every day. Avoid going long periods of time without eating.  Eat foods that are high in fiber, such as fresh fruits, vegetables, beans, and whole grains. Talk with your dietitian about how many servings of carbs you can eat at each meal.  Eat 4-6 oz (112-168 g) of lean protein each day, such as lean meat, chicken, fish, eggs, or tofu. One ounce (oz) of lean protein is equal to: ? 1 oz (28 g) of meat, chicken, or fish. ? 1 egg. ?  cup (62 g) of tofu.  Eat some foods each day that contain healthy fats, such as avocado, nuts, seeds, and fish.   What foods should I eat? Fruits Berries. Apples. Oranges. Peaches. Apricots. Plums. Grapes. Mango. Papaya. Pomegranate. Kiwi. Cherries. Vegetables Lettuce. Spinach. Leafy greens, including kale, chard, collard greens, and mustard greens. Beets. Cauliflower. Cabbage. Broccoli. Carrots. Green beans. Tomatoes. Peppers. Onions. Cucumbers. Brussels sprouts. Grains Whole grains, such as whole-wheat or whole-grain bread, crackers, tortillas, cereal, and pasta. Unsweetened oatmeal. Quinoa. Brown or wild rice. Meats and other proteins Seafood. Poultry without skin. Lean cuts of poultry and beef. Tofu. Nuts. Seeds. Dairy Low-fat or fat-free dairy products such as milk, yogurt, and cheese. The items listed above may not be a complete list of foods and beverages you can eat. Contact a dietitian for more information. What foods should I avoid? Fruits Fruits canned with  syrup. Vegetables Canned vegetables. Frozen vegetables with butter or cream sauce. Grains Refined white flour and flour products such as bread, pasta, snack foods, and cereals. Avoid all processed foods. Meats and other proteins Fatty cuts of meat. Poultry with skin. Breaded or fried meats. Processed meat. Avoid saturated fats. Dairy Full-fat yogurt, cheese, or milk. Beverages Sweetened drinks, such as soda or iced tea. The items listed above may not be a complete list of foods and beverages you should avoid. Contact a dietitian for more information. Questions to ask a health care provider  Do I need to meet with a diabetes educator?  Do I need to meet with a dietitian?  What number can I call if I have questions?  When are the best times to check my blood glucose? Where to find more information:  American Diabetes Association: diabetes.org  Academy of Nutrition and Dietetics: www.eatright.org  National Institute of Diabetes and Digestive and Kidney Diseases: www.niddk.nih.gov  Association of Diabetes Care and Education Specialists: www.diabeteseducator.org Summary  It is important to have healthy eating   habits because your blood sugar (glucose) levels are greatly affected by what you eat and drink.  A healthy meal plan will help you control your blood glucose and maintain a healthy lifestyle.  Your health care provider may recommend that you work with a dietitian to make a meal plan that is best for you.  Keep in mind that carbohydrates (carbs) and alcohol have immediate effects on your blood glucose levels. It is important to count carbs and to use alcohol carefully. This information is not intended to replace advice given to you by your health care provider. Make sure you discuss any questions you have with your health care provider. Document Revised: 08/15/2019 Document Reviewed: 08/15/2019 Elsevier Patient Education  2021 Elsevier Inc.  

## 2020-10-18 ENCOUNTER — Telehealth: Payer: Self-pay

## 2020-10-18 LAB — LIPID PANEL
Chol/HDL Ratio: 7.7 ratio — ABNORMAL HIGH (ref 0.0–5.0)
Cholesterol, Total: 199 mg/dL (ref 100–199)
HDL: 26 mg/dL — ABNORMAL LOW (ref >39)
LDL Chol Calc (NIH): 89 mg/dL (ref 0–99)
Triglycerides: 511 mg/dL — ABNORMAL HIGH (ref 0–149)
VLDL Cholesterol Cal: 84 mg/dL — ABNORMAL HIGH (ref 5–40)

## 2020-10-18 LAB — CMP14+EGFR
ALT: 44 IU/L (ref 0–44)
AST: 50 IU/L — ABNORMAL HIGH (ref 0–40)
Albumin/Globulin Ratio: 1.8 (ref 1.2–2.2)
Albumin: 4.8 g/dL (ref 3.8–4.9)
Alkaline Phosphatase: 102 IU/L (ref 44–121)
BUN/Creatinine Ratio: 16 (ref 9–20)
BUN: 18 mg/dL (ref 6–24)
Bilirubin Total: 0.5 mg/dL (ref 0.0–1.2)
CO2: 24 mmol/L (ref 20–29)
Calcium: 10.1 mg/dL (ref 8.7–10.2)
Chloride: 97 mmol/L (ref 96–106)
Creatinine, Ser: 1.15 mg/dL (ref 0.76–1.27)
GFR calc Af Amer: 84 mL/min/{1.73_m2} (ref >59)
GFR calc non Af Amer: 72 mL/min/{1.73_m2} (ref >59)
Globulin, Total: 2.7 g/dL (ref 1.5–4.5)
Glucose: 261 mg/dL — ABNORMAL HIGH (ref 65–99)
Potassium: 4.3 mmol/L (ref 3.5–5.2)
Sodium: 138 mmol/L (ref 134–144)
Total Protein: 7.5 g/dL (ref 6.0–8.5)

## 2020-10-18 LAB — HEMOGLOBIN A1C
Est. average glucose Bld gHb Est-mCnc: 217 mg/dL
Hgb A1c MFr Bld: 9.2 % — ABNORMAL HIGH (ref 4.8–5.6)

## 2020-10-18 NOTE — Chronic Care Management (AMB) (Signed)
    Chronic Care Management Pharmacy Assistant   Name: Stephen Chase  MRN: [id number] DOB: 1966/03/30  Reason for Encounter: Medication Review   PCP : [name], FNP  Allergies:  No Known Allergies  Medications: Outpatient Encounter Medications as of 10/18/2020  Medication Sig  . Accu-Chek Softclix Lancets lancets Use as instructed  . aspirin EC 81 MG tablet Take 81 mg by mouth daily.  Marland Kitchen atorvastatin (LIPITOR) 40 MG tablet TAKE 1 TABLET(40 MG) BY MOUTH AT BEDTIME  . Blood Glucose Monitoring Suppl (ACCU-CHEK GUIDE) w/Device KIT Use to check blood sugars three times daily E11.9  . carvedilol (COREG) 6.25 MG tablet TAKE 1 TABLET(6.25 MG) BY MOUTH TWICE DAILY WITH A MEAL  . cholecalciferol (VITAMIN D3) 25 MCG (1000 UNIT) tablet Take 1,000 Units by mouth daily.  . Cyanocobalamin (B-12 PO) Take by mouth daily.  . cyclobenzaprine (FLEXERIL) 10 MG tablet Take 1 tablet (10 mg total) by mouth 3 (three) times daily as needed for muscle spasms.  . fenofibrate (TRICOR) 145 MG tablet TAKE 1 TABLET(145 MG) BY MOUTH DAILY  . glucose blood (ACCU-CHEK GUIDE) test strip CHECK BLOOD SUGAR 3 TIMES A DAY BEFORE BREAKFAST, LUNCH, AND DINNER  . Icosapent Ethyl (VASCEPA) 1 g CAPS One bid for elevated triglycerides  . Insulin Pen Needle (PEN NEEDLES) 32G X 4 MM MISC Inject 1 each into the skin daily.  Marland Kitchen lisinopril (ZESTRIL) 20 MG tablet Take 1 tablet (20 mg total) by mouth daily.  . Magnesium 250 MG TABS Take 1 tablet (250 mg total) by mouth every evening. With evening meals  . modafinil (PROVIGIL) 200 MG tablet TAKE 1 TABLET(200 MG) BY MOUTH DAILY  . Multiple Vitamins-Minerals (MULTIVITAMIN WITH MINERALS) tablet Take 1 tablet by mouth daily.  . niacin (NIASPAN) 500 MG CR tablet Take 2,000 mg by mouth at bedtime.   Marland Kitchen olmesartan-hydrochlorothiazide (BENICAR HCT) 40-25 MG tablet Take 1 tablet by mouth daily.  . Omega-3 Fatty Acids (FISH OIL) 1000 MG CPDR Take by mouth. Take one tablet daily  . omeprazole  (PRILOSEC) 10 MG capsule Take 1 capsule (10 mg total) by mouth daily.  Marland Kitchen OVER THE COUNTER MEDICATION Vitamin D 3 One capsule daily.  . Semaglutide, 1 MG/DOSE, (OZEMPIC, 1 MG/DOSE,) 2 MG/1.5ML SOPN Inject 1 mg into the skin once a week. Inject 29m into the skin daily  . tamsulosin (FLOMAX) 0.4 MG CAPS capsule TAKE 1 CAPSULE BY MOUTH EVERY DAY 30 MINUTES AFTER THE SAME MEAL   No facility-administered encounter medications on file as of 10/18/2020.    Current Diagnosis: Patient Active Problem List   Diagnosis Date Noted  . Hypersomnia, persistent 05/09/2018  . Type 2 diabetes mellitus with diabetic neuropathy, without long-term current use of insulin ([place] 05/09/2018  . Uncontrolled type 2 diabetes mellitus with hyperglycemia ([place] 05/09/2018  . OSA on CPAP 05/09/2018  . Esophageal varices without bleeding ([place] 05/09/2018  . Bilateral deafness 05/09/2018     Follow-Up:  Pharmacist Review-Reviewed chart and adherence measures. Per insurance data Total Gaps-Star Measures are (2). Total Gaps-All Measures are (2).  Medication Adherence for Diabetes-Not Met-Med Compliance is 10-19%.  VOrlando Penner CPP Notified.  CRaynelle Highland [facility]Pharmacist Assistant ([phone]

## 2020-10-31 ENCOUNTER — Other Ambulatory Visit: Payer: Self-pay | Admitting: Neurology

## 2020-11-02 ENCOUNTER — Other Ambulatory Visit: Payer: Self-pay | Admitting: Neurology

## 2020-11-03 ENCOUNTER — Telehealth: Payer: Self-pay | Admitting: Neurology

## 2020-11-03 DIAGNOSIS — G4710 Hypersomnia, unspecified: Secondary | ICD-10-CM

## 2020-11-03 DIAGNOSIS — G4733 Obstructive sleep apnea (adult) (pediatric): Secondary | ICD-10-CM

## 2020-11-03 DIAGNOSIS — Z9989 Dependence on other enabling machines and devices: Secondary | ICD-10-CM

## 2020-11-03 MED ORDER — MODAFINIL 200 MG PO TABS: ORAL_TABLET | ORAL | 5 refills | Status: DC

## 2020-11-03 NOTE — Telephone Encounter (Addendum)
Received a phone call from patient was on call physician, failed to reach patient by number provided, asking for refill on modafinil,  Was given modafinil 200 mg daily 30 tablets on October 04, 2020 by Dr. Rexene Alberts without refill.  Was seen by office on October 09, 2020,  Meds ordered this encounter  Medications  . modafinil (PROVIGIL) 200 MG tablet    Sig: TAKE 1 TABLET(200 MG) BY MOUTH DAILY    Dispense:  30 tablet    Refill:  5

## 2020-11-27 ENCOUNTER — Encounter: Payer: Self-pay | Admitting: Nurse Practitioner

## 2020-11-27 ENCOUNTER — Telehealth: Payer: Self-pay

## 2020-11-27 NOTE — Progress Notes (Signed)
11/29/20-Contacted the patient to identify the patient's availability for a CCM Call visit with [name], CPP. Patient confirmed appointment date 12/10/20 at 1:00 PM. I was advised the patient will contact me back if something changes.   [name], CPP Notified.  Raynelle Highland, [place] Pharmacist Assistant [phone] CCM Total Time: 6 minutes

## 2020-12-03 ENCOUNTER — Telehealth: Payer: Self-pay | Admitting: Adult Health

## 2020-12-03 NOTE — Telephone Encounter (Signed)
Called pt and spoke VNS that pt requesting list of medications with CPAP/OSA on the list.  Will email to [email or web address] and also mail copy to home address.  (confirmed).

## 2020-12-03 NOTE — Telephone Encounter (Signed)
Received call from [place] interpreter Pt called, need a paper with CPAP list, medication list and sleep apnea diagnosis. Want to have just in case I need it. Would like a call from the nurse. If no answer leave a message.

## 2020-12-03 NOTE — Telephone Encounter (Signed)
Pt. called RN back & asked for a message to be left if unable to reach. I wasn't aware that pt. has an interpreter & apologized as I was asking who the caller was.

## 2020-12-03 NOTE — Telephone Encounter (Signed)
I placed med list in mail also emailed to pt.

## 2020-12-10 ENCOUNTER — Telehealth: Payer: Medicare PPO

## 2021-01-10 ENCOUNTER — Other Ambulatory Visit: Payer: Self-pay | Admitting: Nurse Practitioner

## 2021-01-17 ENCOUNTER — Telehealth: Payer: Self-pay

## 2021-01-17 NOTE — Telephone Encounter (Signed)
Contacted patient to schedule an appointment for patient to be seen in May. Patient confirmed availability in May and we will review patients current medications. Patient reports they have picked up his Ozempic, and Fenofibrate was very expensive. Will follow up with ideas during upcoming office visit and collaborate with PCP team with alternative medication options due to cost.   Total Time 20 minutes  [name], PharmD Clinical Pharmacist [facility] [phone]

## 2021-01-20 ENCOUNTER — Encounter: Payer: Self-pay | Admitting: Nurse Practitioner

## 2021-01-20 ENCOUNTER — Ambulatory Visit: Payer: Medicare PPO | Admitting: Nurse Practitioner

## 2021-01-20 ENCOUNTER — Other Ambulatory Visit: Payer: Self-pay

## 2021-01-20 VITALS — BP 114/60 | HR 76 | Temp 98.2°F | Ht 69.0 in | Wt 238.2 lb

## 2021-01-20 DIAGNOSIS — E1140 Type 2 diabetes mellitus with diabetic neuropathy, unspecified: Secondary | ICD-10-CM | POA: Diagnosis not present

## 2021-01-20 DIAGNOSIS — I10 Essential (primary) hypertension: Secondary | ICD-10-CM | POA: Diagnosis not present

## 2021-01-20 DIAGNOSIS — Z23 Encounter for immunization: Secondary | ICD-10-CM

## 2021-01-20 DIAGNOSIS — E782 Mixed hyperlipidemia: Secondary | ICD-10-CM | POA: Diagnosis not present

## 2021-01-20 MED ORDER — TETANUS-DIPHTH-ACELL PERTUSSIS 5-2.5-18.5 LF-MCG/0.5 IM SUSP
0.5000 mL | Freq: Once | INTRAMUSCULAR | 0 refills | Status: AC
Start: 2021-01-20 — End: 2021-01-20

## 2021-01-20 NOTE — Patient Instructions (Signed)
Diabetes Mellitus Basics  Diabetes mellitus, or diabetes, is a long-term (chronic) disease. It occurs when the body does not properly use sugar (glucose) that is released from food after you eat. Diabetes mellitus may be caused by one or both of these problems:  Your pancreas does not make enough of a hormone called insulin.  Your body does not react in a normal way to the insulin that it makes. Insulin lets glucose enter cells in your body. This gives you energy. If you have diabetes, glucose cannot get into cells. This causes high blood glucose (hyperglycemia). How to treat and manage diabetes You may need to take insulin or other diabetes medicines daily to keep your glucose in balance. If you are prescribed insulin, you will learn how to give yourself insulin by injection. You may need to adjust the amount of insulin you take based on the foods that you eat. You will need to check your blood glucose levels using a glucose monitor as told by your health care provider. The readings can help determine if you have low or high blood glucose. Generally, you should have these blood glucose levels:  Before meals (preprandial): 80-130 mg/dL (4.4-7.2 mmol/L).  After meals (postprandial): below 180 mg/dL (10 mmol/L).  Hemoglobin A1c (HbA1c) level: less than 7%. Your health care provider will set treatment goals for you. Keep all follow-up visits. This is important. Follow these instructions at home: Diabetes medicines Take your diabetes medicines every day as told by your health care provider. List your diabetes medicines here:  Name of medicine: ______________________________ ? Amount (dose): _______________ Time (a.m./p.m.): _______________ Notes: ___________________________________  Name of medicine: ______________________________ ? Amount (dose): _______________ Time (a.m./p.m.): _______________ Notes: ___________________________________  Name of medicine:  ______________________________ ? Amount (dose): _______________ Time (a.m./p.m.): _______________ Notes: ___________________________________ Insulin If you use insulin, list the types of insulin you use here:  Insulin type: ______________________________ ? Amount (dose): _______________ Time (a.m./p.m.): _______________Notes: ___________________________________  Insulin type: ______________________________ ? Amount (dose): _______________ Time (a.m./p.m.): _______________ Notes: ___________________________________  Insulin type: ______________________________ ? Amount (dose): _______________ Time (a.m./p.m.): _______________ Notes: ___________________________________  Insulin type: ______________________________ ? Amount (dose): _______________ Time (a.m./p.m.): _______________ Notes: ___________________________________  Insulin type: ______________________________ ? Amount (dose): _______________ Time (a.m./p.m.): _______________ Notes: ___________________________________ Managing blood glucose Check your blood glucose levels using a glucose monitor as told by your health care provider. Write down the times that you check your glucose levels here:  Time: _______________ Notes: ___________________________________  Time: _______________ Notes: ___________________________________  Time: _______________ Notes: ___________________________________  Time: _______________ Notes: ___________________________________  Time: _______________ Notes: ___________________________________  Time: _______________ Notes: ___________________________________   Low blood glucose Low blood glucose (hypoglycemia) is when glucose is at or below 70 mg/dL (3.9 mmol/L). Symptoms may include:  Feeling: ? Hungry. ? Sweaty and clammy. ? Irritable or easily upset. ? Dizzy. ? Sleepy.  Having: ? A fast heartbeat. ? A headache. ? A change in your vision. ? Numbness around the mouth, lips, or  tongue.  Having trouble with: ? Moving (coordination). ? Sleeping. Treating low blood glucose To treat low blood glucose, eat or drink something containing sugar right away. If you can think clearly and swallow safely, follow the 15:15 rule:  Take 15 grams of a fast-acting carb (carbohydrate), as told by your health care provider.  Some fast-acting carbs are: ? Glucose tablets: take 3-4 tablets. ? Hard candy: eat 3-5 pieces. ? Fruit juice: drink 4 oz (120 mL). ? Regular (not diet) soda: drink 4-6 oz (120-180 mL). ? Honey or sugar:   eat 1 Tbsp (15 mL).  Check your blood glucose levels 15 minutes after you take the carb.  If your glucose is still at or below 70 mg/dL (3.9 mmol/L), take 15 grams of a carb again.  If your glucose does not go above 70 mg/dL (3.9 mmol/L) after 3 tries, get help right away.  After your glucose goes back to normal, eat a meal or a snack within 1 hour. Treating very low blood glucose If your glucose is at or below 54 mg/dL (3 mmol/L), you have very low blood glucose (severe hypoglycemia). This is an emergency. Do not wait to see if the symptoms will go away. Get medical help right away. Call your local emergency services (911 in the [place] Do not drive yourself to the hospital. Questions to ask your health care provider  Should I talk with a diabetes educator?  What equipment will I need to care for myself at home?  What diabetes medicines do I need? When should I take them?  How often do I need to check my blood glucose levels?  What number can I call if I have questions?  When is my follow-up visit?  Where can I find a support group for people with diabetes? Where to find more information  American Diabetes Association: www.diabetes.org  Association of Diabetes Care and Education Specialists: www.diabeteseducator.org Contact a health care provider if:  Your blood glucose is at or above 240 mg/dL (13.3 mmol/L) for 2 days in a row.  You have  been sick or have had a fever for 2 days or more, and you are not getting better.  You have any of these problems for more than 6 hours: ? You cannot eat or drink. ? You feel nauseous. ? You vomit. ? You have diarrhea. Get help right away if:  Your blood glucose is lower than 54 mg/dL (3 mmol/L).  You get confused.  You have trouble thinking clearly.  You have trouble breathing. These symptoms may represent a serious problem that is an emergency. Do not wait to see if the symptoms will go away. Get medical help right away. Call your local emergency services (911 in the [place] Do not drive yourself to the hospital. Summary  Diabetes mellitus is a chronic disease that occurs when the body does not properly use sugar (glucose) that is released from food after you eat.  Take insulin and diabetes medicines as told.  Check your blood glucose every day, as often as told.  Keep all follow-up visits. This is important. This information is not intended to replace advice given to you by your health care provider. Make sure you discuss any questions you have with your health care provider. Document Revised: 01/09/2020 Document Reviewed: 01/09/2020 Elsevier Patient Education  2021 Elsevier Inc.  

## 2021-01-20 NOTE — Progress Notes (Signed)
I,Yamilka Roman [employer] as a Education administrator for [employer], FNP.,have documented all relevant documentation on the behalf of [name], FNP,as directed by  [name], FNP while in the presence of [name], [place]. This visit occurred during the SARS-CoV-2 public health emergency.  Safety protocols were in place, including screening questions prior to the visit, additional usage of staff PPE, and extensive cleaning of exam room while observing appropriate contact time as indicated for disinfecting solutions.  Subjective:     Patient ID: Stephen Chase , male    DOB: Jan 25, 1966 , 55 y.o.   MRN: [id number]   Chief Complaint  Patient presents with  . Diabetes  . Hypertension    HPI  Patient presents today for a diabetes and blood pressure check. He stated his blood sugars have been averaging around 200s. He stated he is checking his sugars about 3 times a day. He continues taking Ozempic. Reports he is outside walking a lot. He has seen an orthopedic found arthritis to his low back, smaller areas between discs he did not get a steroid. Occasionally will have electrical shock.      Wt Readings from Last 3 Encounters: 01/20/21 : 238 lb 3.2 oz (108 kg) 10/17/20 : 238 lb 9.6 oz (108.2 kg) 10/09/20 : 246 lb (111.6 kg)  He is here with his deaf interpreter.    Diabetes He presents for his follow-up diabetic visit. He has type 2 diabetes mellitus. His disease course has been stable (not much improved). Pertinent negatives for hypoglycemia include no dizziness or headaches. Pertinent negatives for diabetes include no chest pain, no polydipsia, no polyphagia and no polyuria. There are no hypoglycemic complications. Symptoms are stable. There are no diabetic complications. Risk factors for coronary artery disease include obesity and sedentary lifestyle. His weight is stable. He is following a generally unhealthy diet. When asked about meal planning, he reported none. He has not had a previous visit  with a dietitian. He participates in exercise intermittently (he is walking alot and working a strenuous job.). (Average blood sugar 200's over 90 days.  Does admit to eating more junk at night time while on the computer. ) An ACE inhibitor/angiotensin II receptor blocker is being taken. Eye exam is current.  Hypertension This is a chronic problem. The current episode started more than 1 year ago. The problem is controlled. Pertinent negatives include no chest pain, headaches or palpitations. There are no associated agents to hypertension. Risk factors for coronary artery disease include obesity, sedentary lifestyle, dyslipidemia, diabetes mellitus and male gender. Past treatments include ACE inhibitors. There are no compliance problems.  There is no history of chronic renal disease.     Past Medical History:  Diagnosis Date  . Allergy   . Arthritis    SHOULDERS  . Balanitis   . Deafness, sensorineural, childhood onset    BILATERAL SINCE AGE 57  SECONDARY TO UNKNOWN ILLNESS  . GERD (gastroesophageal reflux disease)   . Hyperlipidemia   . Hypertension   . No blood products    Patient is Jehovah Witness no blood products.  . OSA on CPAP    SEVERE PER STUDY 03-09-2009  . Sleep apnea    uses a cpap  . Smokers' cough ([place])   . Type 2 diabetes mellitus (HCC)      Family History  Problem Relation Age of Onset  . Cancer Mother        type unknown pt was 33 years old at that time  . Liver  disease Father   . Healthy Brother   . Cancer Other         alot of family members have cancer but doesnt know the type  . Colon cancer Neg Hx   . Esophageal cancer Neg Hx   . Rectal cancer Neg Hx   . Stomach cancer Neg Hx      Current Outpatient Medications:  .  Accu-Chek Softclix Lancets lancets, Use as instructed, Disp: 200 each, Rfl: 3 .  aspirin EC 81 MG tablet, Take 81 mg by mouth daily., Disp: , Rfl:  .  atorvastatin (LIPITOR) 40 MG tablet, TAKE 1 TABLET(40 MG) BY MOUTH AT BEDTIME, Disp: 90  tablet, Rfl: 1 .  Blood Glucose Monitoring Suppl (ACCU-CHEK GUIDE) w/Device KIT, Use to check blood sugars three times daily E11.9, Disp: 1 kit, Rfl: 0 .  carvedilol (COREG) 6.25 MG tablet, TAKE 1 TABLET(6.25 MG) BY MOUTH TWICE DAILY WITH A MEAL, Disp: 180 tablet, Rfl: 1 .  cholecalciferol (VITAMIN D3) 25 MCG (1000 UNIT) tablet, Take 1,000 Units by mouth daily., Disp: , Rfl:  .  Cyanocobalamin (B-12 PO), Take by mouth daily., Disp: , Rfl:  .  cyclobenzaprine (FLEXERIL) 10 MG tablet, Take 1 tablet (10 mg total) by mouth 3 (three) times daily as needed for muscle spasms., Disp: 30 tablet, Rfl: 0 .  fenofibrate (TRICOR) 145 MG tablet, TAKE 1 TABLET(145 MG) BY MOUTH DAILY, Disp: 90 tablet, Rfl: 1 .  glucose blood (ACCU-CHEK GUIDE) test strip, CHECK BLOOD SUGAR 3 TIMES A DAY BEFORE BREAKFAST, LUNCH, AND DINNER, Disp: 150 strip, Rfl: 2 .  Icosapent Ethyl (VASCEPA) 1 g CAPS, One bid for elevated triglycerides, Disp: 180 capsule, Rfl: 0 .  Insulin Pen Needle (PEN NEEDLES) 32G X 4 MM MISC, Inject 1 each into the skin daily., Disp: 30 each, Rfl: 2 .  lisinopril (ZESTRIL) 20 MG tablet, Take 1 tablet (20 mg total) by mouth daily., Disp: 90 tablet, Rfl: 1 .  Magnesium 250 MG TABS, Take 1 tablet (250 mg total) by mouth every evening. With evening meals, Disp: 30 tablet, Rfl: 2 .  modafinil (PROVIGIL) 200 MG tablet, TAKE 1 TABLET(200 MG) BY MOUTH DAILY, Disp: 30 tablet, Rfl: 5 .  Multiple Vitamins-Minerals (MULTIVITAMIN WITH MINERALS) tablet, Take 1 tablet by mouth daily., Disp: , Rfl:  .  niacin (NIASPAN) 500 MG CR tablet, Take 2,000 mg by mouth at bedtime. , Disp: , Rfl:  .  olmesartan-hydrochlorothiazide (BENICAR HCT) 40-25 MG tablet, Take 1 tablet by mouth daily., Disp: 90 tablet, Rfl: 1 .  Omega-3 Fatty Acids (FISH OIL) 1000 MG CPDR, Take by mouth. Take one tablet daily, Disp: , Rfl:  .  omeprazole (PRILOSEC) 10 MG capsule, Take 1 capsule (10 mg total) by mouth daily., Disp: 90 capsule, Rfl: 1 .  OVER THE  COUNTER MEDICATION, Vitamin D 3 One capsule daily., Disp: , Rfl:  .  Semaglutide, 1 MG/DOSE, (OZEMPIC, 1 MG/DOSE,) 2 MG/1.5ML SOPN, Inject 1 mg into the skin once a week. Inject 1mg  into the skin daily, Disp: , Rfl:  .  tamsulosin (FLOMAX) 0.4 MG CAPS capsule, TAKE 1 CAPSULE BY MOUTH EVERY DAY 30 MINUTES AFTER THE SAME MEAL, Disp: 90 capsule, Rfl: 1 .  Tdap (BOOSTRIX) 5-2.5-18.5 LF-MCG/0.5 injection, Inject 0.5 mLs into the muscle once for 1 dose., Disp: 0.5 mL, Rfl: 0 .  UNABLE TO FIND, CPAP for OSA (6cm-19cm), Disp: , Rfl:    No Known Allergies   Review of Systems  Constitutional: Negative.  Eyes: Negative.   Respiratory: Negative.   Cardiovascular: Negative for chest pain, palpitations and leg swelling.  Endocrine: Negative for polydipsia, polyphagia and polyuria.  Musculoskeletal: Negative.   Skin: Negative.   Neurological: Negative for dizziness and headaches.  Hematological: Negative.   Psychiatric/Behavioral: Negative.      Today's Vitals   01/20/21 0932  BP: 114/60  Pulse: 76  Temp: 98.2 F (36.8 C)  TempSrc: Oral  Weight: 238 lb 3.2 oz (108 kg)  Height: $Remove'5\' 9"'QTepLhv$  (1.753 m)  PainSc: 5   PainLoc: Back   Body mass index is 35.18 kg/m.   Objective:  Physical Exam Vitals reviewed.  Constitutional:      General: He is not in acute distress.    Appearance: Normal appearance. He is obese.  HENT:     Head: Normocephalic and atraumatic.  Eyes:     Pupils: Pupils are equal, round, and reactive to light.  Cardiovascular:     Rate and Rhythm: Normal rate and regular rhythm.     Pulses: Normal pulses.     Heart sounds: Normal heart sounds. No murmur heard.   Pulmonary:     Effort: Pulmonary effort is normal. No respiratory distress.     Breath sounds: Normal breath sounds. No wheezing.  Skin:    General: Skin is warm.     Capillary Refill: Capillary refill takes less than 2 seconds.  Neurological:     General: No focal deficit present.     Mental Status: He is  alert and oriented to person, place, and time.     Cranial Nerves: No cranial nerve deficit.  Psychiatric:        Mood and Affect: Mood normal.        Behavior: Behavior normal.        Thought Content: Thought content normal.        Judgment: Judgment normal.         Assessment And Plan:     1. Type 2 diabetes mellitus with diabetic neuropathy, without long-term current use of insulin (HCC)  Chronic, HgbA1c was 9.2 at his last visit and he admitted to eating poorly pending the results will consider starting another medications  Continue with current medications  Encouraged to limit intake of sugary foods and drinks  Encouraged to increase physical activity to 150 minutes per week - CMP14+EGFR - Hemoglobin A1c  2. Essential hypertension  Chronic, blood pressure is under excellent control - CMP14+EGFR  3. Mixed hyperlipidemia  Chronic, controlled  Continue with current medications, tolerating medications well - Lipid panel  4. Encounter for immunization  Rx sent to pharmacy.  TDAP will be administered to adults 46-5 years old every 10 years. - Tdap (BOOSTRIX) 5-2.5-18.5 LF-MCG/0.5 injection; Inject 0.5 mLs into the muscle once for 1 dose.  Dispense: 0.5 mL; Refill: 0    Patient was given opportunity to ask questions. Patient verbalized understanding of the plan and was able to repeat key elements of the plan. All questions were answered to their satisfaction.   [name], FNP   I, [name], FNP, have reviewed all documentation for this visit. The documentation on 01/20/21 for the exam, diagnosis, procedures, and orders are all accurate and complete.   IF YOU HAVE BEEN REFERRED TO A SPECIALIST, IT MAY TAKE 1-2 WEEKS TO SCHEDULE/PROCESS THE REFERRAL. IF YOU HAVE NOT HEARD FROM US/SPECIALIST IN TWO WEEKS, PLEASE GIVE [place] A CALL AT [phone] X [phone].   THE PATIENT IS ENCOURAGED TO PRACTICE SOCIAL DISTANCING DUE  TO THE COVID-19 PANDEMIC.

## 2021-01-21 LAB — LIPID PANEL
Chol/HDL Ratio: 7.2 ratio — ABNORMAL HIGH (ref 0.0–5.0)
Cholesterol, Total: 166 mg/dL (ref 100–199)
HDL: 23 mg/dL — ABNORMAL LOW (ref >39)
LDL Chol Calc (NIH): 55 mg/dL (ref 0–99)
Triglycerides: 594 mg/dL (ref 0–149)
VLDL Cholesterol Cal: 88 mg/dL — ABNORMAL HIGH (ref 5–40)

## 2021-01-21 LAB — CMP14+EGFR
ALT: 36 IU/L (ref 0–44)
AST: 29 IU/L (ref 0–40)
Albumin/Globulin Ratio: 2 (ref 1.2–2.2)
Albumin: 4.9 g/dL (ref 3.8–4.9)
Alkaline Phosphatase: 81 IU/L (ref 44–121)
BUN/Creatinine Ratio: 18 (ref 9–20)
BUN: 20 mg/dL (ref 6–24)
Bilirubin Total: 0.5 mg/dL (ref 0.0–1.2)
CO2: 21 mmol/L (ref 20–29)
Calcium: 10.1 mg/dL (ref 8.7–10.2)
Chloride: 99 mmol/L (ref 96–106)
Creatinine, Ser: 1.13 mg/dL (ref 0.76–1.27)
Globulin, Total: 2.4 g/dL (ref 1.5–4.5)
Glucose: 216 mg/dL — ABNORMAL HIGH (ref 65–99)
Potassium: 4.5 mmol/L (ref 3.5–5.2)
Sodium: 136 mmol/L (ref 134–144)
Total Protein: 7.3 g/dL (ref 6.0–8.5)
eGFR: 77 mL/min/{1.73_m2} (ref >59)

## 2021-01-21 LAB — HEMOGLOBIN A1C
Est. average glucose Bld gHb Est-mCnc: 183 mg/dL
Hgb A1c MFr Bld: 8 % — ABNORMAL HIGH (ref 4.8–5.6)

## 2021-01-22 ENCOUNTER — Telehealth: Payer: Self-pay

## 2021-01-22 NOTE — Chronic Care Management (AMB) (Signed)
Called patient for an appointment reminder with [name], [name] on 01-23-2021 at 3:30. Instructed patient to have all meds/supplements and logs available for review. Patient voiced understanding.  [place]  [phone]

## 2021-01-23 ENCOUNTER — Telehealth: Payer: Self-pay

## 2021-01-23 NOTE — Progress Notes (Deleted)
Chronic Care Management Pharmacy Note  01/23/2021 Name:  Stephen Chase MRN:  [id number] DOB:  [birth year]  Subjective: Stephen Chase is an 55 y.o. year old male who is a primary patient of [name], [place].  The CCM team was consulted for assistance with disease management and care coordination needs.    {CCMTELEPHONEFACETOFACE:21091510} for {CCMINITIALFOLLOWUPCHOICE:21091511} in response to provider referral for pharmacy case management and/or care coordination services.   Consent to Services:  {CCMCONSENTOPTIONS:25074}  Patient Care Team: [name], FNP as PCP - General (General Practice) Cyril Mourning, [facility] (Inactive) (Pharmacist)  Recent office visits: ***  Recent consult visits: [place] visits: {Hospital DC Yes/No:25215}  Objective:  Lab Results  Component Value Date   CREATININE 1.13 01/20/2021   BUN 20 01/20/2021   GFRNONAA 72 10/17/2020   GFRAA 84 10/17/2020   NA 136 01/20/2021   K 4.5 01/20/2021   CALCIUM 10.1 01/20/2021   CO2 21 01/20/2021   GLUCOSE 216 (H) 01/20/2021    Lab Results  Component Value Date/Time   HGBA1C 8.0 (H) 01/20/2021 10:25 AM   HGBA1C 9.2 (H) 10/17/2020 02:37 PM   MICROALBUR 80 04/10/2020 11:49 AM   MICROALBUR 30 03/09/2019 03:09 PM    Last diabetic Eye exam:  Lab Results  Component Value Date/Time   HMDIABEYEEXA No Retinopathy 07/29/2020 12:00 AM    Last diabetic Foot exam: No results found for: HMDIABFOOTEX   Lab Results  Component Value Date   CHOL 166 01/20/2021   HDL 23 (L) 01/20/2021   LDLCALC 55 01/20/2021   TRIG 594 (HH) 01/20/2021   CHOLHDL 7.2 (H) 01/20/2021    Hepatic Function Latest Ref Rng & Units 01/20/2021 10/17/2020 07/17/2020  Total Protein 6.0 - 8.5 g/dL 7.3 7.5 7.4  Albumin 3.8 - 4.9 g/dL 4.9 4.8 4.8  AST 0 - 40 IU/L 29 50(H) 39  ALT 0 - 44 IU/L 36 44 34  Alk Phosphatase 44 - 121 IU/L 81 102 100  Total Bilirubin 0.0 - 1.2 mg/dL 0.5 0.5 0.7  Bilirubin, Direct 0.0 - 0.3 mg/dL - - -     Lab Results  Component Value Date/Time   TSH 1.810 03/09/2019 12:53 PM   FREET4 1.26 03/09/2019 12:53 PM    CBC Latest Ref Rng & Units 02/22/2020 11/23/2019 06/15/2019  WBC 3.4 - 10.8 x10E3/uL 6.1 7.3 6.3  Hemoglobin 13.0 - 17.7 g/dL 15.1 15.6 14.8  Hematocrit 37.5 - 51.0 % 44.0 45.1 45.2  Platelets 150 - 450 x10E3/uL 158 161 140(L)    No results found for: VD25OH  Clinical ASCVD: {YES/NO:21197} The 10-year ASCVD risk score Mikey Bussing DC Jr., et al., 2013) is: 14.7%   Values used to calculate the score:     Age: 40 years     Sex: Male     Is Non-Hispanic African American: No     Diabetic: Yes     Tobacco smoker: No     Systolic Blood Pressure: 409 mmHg     Is BP treated: Yes     HDL Cholesterol: 23 mg/dL     Total Cholesterol: 166 mg/dL    Depression screen [facility] 2/9 04/10/2020 04/20/2019 03/09/2019  Decreased Interest 0 0 0  Down, Depressed, Hopeless 0 0 0  PHQ - 2 Score 0 0 0     ***Other: (CHADS2VASc if Afib, MMRC or CAT for COPD, ACT, DEXA)  Social History   Tobacco Use  Smoking Status Former Smoker  . Packs/day: 1.00  . Years: 30.00  . Pack  years: 30.00  . Types: Cigarettes  Smokeless Tobacco Never Used  Tobacco Comment   quit 4 years ago   BP Readings from Last 3 Encounters:  01/20/21 114/60  10/17/20 116/72  10/09/20 111/64   Pulse Readings from Last 3 Encounters:  01/20/21 76  10/17/20 80  10/09/20 86   Wt Readings from Last 3 Encounters:  01/20/21 238 lb 3.2 oz (108 kg)  10/17/20 238 lb 9.6 oz (108.2 kg)  10/09/20 246 lb (111.6 kg)   BMI Readings from Last 3 Encounters:  01/20/21 35.18 kg/m  10/17/20 35.24 kg/m  10/09/20 36.33 kg/m    Assessment/Interventions: Review of patient past medical history, allergies, medications, health status, including review of consultants reports, laboratory and other test data, was performed as part of comprehensive evaluation and provision of chronic care management services.   SDOH:  (Social Determinants of  Health) assessments and interventions performed: {yes/no:20286}  SDOH Screenings   Alcohol Screen: Not on file  Depression (PHQ2-9): Low Risk   . PHQ-2 Score: 0  Financial Resource Strain: Medium Risk  . Difficulty of Paying Living Expenses: Somewhat hard  Food Insecurity: No Food Insecurity  . Worried About Charity fundraiser in the Last Year: Never true  . Ran Out of Food in the Last Year: Never true  Housing: Not on file  Physical Activity: Inactive  . Days of Exercise per Week: 0 days  . Minutes of Exercise per Session: 0 min  Social Connections: Not on file  Stress: No Stress Concern Present  . Feeling of Stress : Not at all  Tobacco Use: Medium Risk  . Smoking Tobacco Use: Former Smoker  . Smokeless Tobacco Use: Never Used  Transportation Needs: No Transportation Needs  . Lack of Transportation (Medical): No  . Lack of Transportation (Non-Medical): No    CCM Care Plan  No Known Allergies  Medications Reviewed Today    Reviewed by [name], FNP (Family Nurse Practitioner) on 01/20/21 at 1010  Med List Status: <None>  Medication Order Taking? Sig Documenting Provider Last Dose Status Informant  Accu-Chek Softclix Lancets lancets [id number] Yes Use as instructed [name], FNP Taking Active   aspirin EC 81 MG tablet [id number] Yes Take 81 mg by mouth daily. [provider] Taking Active   atorvastatin (LIPITOR) 40 MG tablet [id number] Yes TAKE 1 TABLET(40 MG) BY MOUTH AT BEDTIME [name], FNP Taking Active   Blood Glucose Monitoring Suppl (ACCU-CHEK GUIDE) w/Device KIT [id number] Yes Use to check blood sugars three times daily E11.9 [name], FNP Taking Active   carvedilol (COREG) 6.25 MG tablet [id number] Yes TAKE 1 TABLET(6.25 MG) BY MOUTH TWICE DAILY WITH A MEAL [name], FNP Taking Active   cholecalciferol (VITAMIN D3) 25 MCG (1000 UNIT) tablet [id number] Yes Take 1,000 Units by mouth daily. [provider] Taking Active Self   Cyanocobalamin (B-12 PO) [id number] Yes Take by mouth daily. [provider] Taking Active Self  cyclobenzaprine (FLEXERIL) 10 MG tablet [id number] Yes Take 1 tablet (10 mg total) by mouth 3 (three) times daily as needed for muscle spasms. [name], FNP Taking Active   fenofibrate (TRICOR) 145 MG tablet [id number] Yes TAKE 1 TABLET(145 MG) BY MOUTH DAILY [name], FNP Taking Active   glucose blood (ACCU-CHEK GUIDE) test strip [id number] Yes CHECK BLOOD SUGAR 3 TIMES A DAY BEFORE BREAKFAST, LUNCH, AND Rayetta Humphrey, FNP Taking Active   Icosapent Ethyl (VASCEPA) 1 g CAPS [id number] Yes One bid for elevated triglycerides Rodriguez-Southworth, Sunday Spillers,  PA-C Taking Active   Insulin Pen Needle (PEN NEEDLES) 32G X 4 MM MISC [id number] Yes Inject 1 each into the skin daily. Rodriguez-Southworth, Sunday Spillers, PA-C Taking Active   lisinopril (ZESTRIL) 20 MG tablet [id number] Yes Take 1 tablet (20 mg total) by mouth daily. [name], FNP Taking Active   Magnesium 250 MG TABS [id number] Yes Take 1 tablet (250 mg total) by mouth every evening. With evening meals [name], FNP Taking Active   modafinil (PROVIGIL) 200 MG tablet [id number] Yes TAKE 1 TABLET(200 MG) BY MOUTH DAILY Marcial Pacas, MD Taking Active   Multiple Vitamins-Minerals (MULTIVITAMIN WITH MINERALS) tablet [id number] Yes Take 1 tablet by mouth daily. [provider] Taking Active   niacin (NIASPAN) 500 MG CR tablet [id number] Yes Take 2,000 mg by mouth at bedtime.  [provider] Taking Active Self  olmesartan-hydrochlorothiazide (BENICAR HCT) 40-25 MG tablet [id number] Yes Take 1 tablet by mouth daily. [name], FNP Taking Active   Omega-3 Fatty Acids (FISH OIL) 1000 MG CPDR [id number] Yes Take by mouth. Take one tablet daily [provider] Taking Active   omeprazole (PRILOSEC) 10 MG capsule [id number] Yes Take 1 capsule (10 mg total) by mouth daily. [name], FNP Taking Active   OVER THE COUNTER  MEDICATION [id number] Yes Vitamin D 3 One capsule daily. [provider] Taking Active   Semaglutide, 1 MG/DOSE, (OZEMPIC, 1 MG/DOSE,) 2 MG/1.5ML SOPN [id number] Yes Inject 1 mg into the skin once a week. Inject 39m into the skin daily [provider] Taking Active   tamsulosin (FLOMAX) 0.4 MG CAPS capsule [id number] TAKE 1 CAPSULE BY MOUTH EVERY DAY 30 MINUTES AFTER THE SAME MEAL MMinette Brine FNP Taking Active   Tdap (BOOSTRIX) 5-2.5-18.5 LF-MCG/0.5 injection [id number] Inject 0.5 mLs into the muscle once for 1 dose. MMinette Brine FNP  Active   UNABLE TO FIND [id number] CPAP for OSA (6cm-19cm) [provider] Taking Active           Patient Active Problem List   Diagnosis Date Noted  . Hypersomnia, persistent 05/09/2018  . Type 2 diabetes mellitus with diabetic neuropathy, without long-term current use of insulin ([place] 05/09/2018  . Uncontrolled type 2 diabetes mellitus with hyperglycemia (H[place] 05/09/2018  . OSA on CPAP 05/09/2018  . Esophageal varices without bleeding ([place] 05/09/2018  . Bilateral deafness 05/09/2018    Immunization History  Administered Date(s) Administered  . Hepatitis B, adult 03/03/2017  . Hepb-cpg 07/13/2018, 08/17/2018, 05/12/2019  . Influenza,inj,Quad PF,6+ Mos 07/13/2018, 05/31/2019, 07/17/2020  . Janssen (J&J) SARS-COV-2 Vaccination 12/28/2019  . Moderna Sars-Covid-2 Vaccination 08/06/2020    Conditions to be addressed/monitored:  {USCCMDZASSESSMENTOPTIONS:23563}  There are no care plans that you recently modified to display for this patient.    Medication Assistance: {MEDASSISTANCEINFO:25044}  Patient's preferred pharmacy is:  [facility]Phone: [phone]: [phone] [id number] [place] [address]Phone: [phone]: [phone] Uses pill box? {Yes or If no, why not?:20788} Pt endorses ***% compliance  We discussed: {Pharmacy options:24294} Patient decided to: {US Pharmacy Plan:23885}  Care Plan and Follow Up Patient Decision:  {FOLLOWUP:24991}  Plan: {CM FOLLOW UP PLXBW:62035} ***   Current Barriers:  . {pharmacybarriers:24917}  Pharmacist Clinical Goal(s):  .Marland KitchenPatient will {PHARMACYGOALCHOICES:24921} through collaboration with  PharmD and provider.   Interventions: . 1:1 collaboration with [name], FNP regarding development and update of comprehensive plan of care as evidenced by provider attestation and co-signature . Inter-disciplinary care team collaboration (see longitudinal plan of care) . Comprehensive medication review performed; medication list updated in electronic medical record  {CCM PHARMD DISEASE STATES:25130}  Patient Goals/Self-Care Activities . Patient will:  - {pharmacypatientgoals:24919}  Follow Up Plan: {CM FOLLOW UP HWTU:88280}

## 2021-01-28 ENCOUNTER — Telehealth: Payer: Self-pay

## 2021-01-28 NOTE — Chronic Care Management (AMB) (Signed)
Left patient an appointment reminder with [name] ,[place] on 01-28-2021 at 12:30. Instructed patient to have all meds/supplements, any logs available for review and if unable to keep appointment to call to reschedule.  [place]  [phone]

## 2021-01-29 ENCOUNTER — Ambulatory Visit (INDEPENDENT_AMBULATORY_CARE_PROVIDER_SITE_OTHER): Payer: Medicare PPO

## 2021-01-29 DIAGNOSIS — I10 Essential (primary) hypertension: Secondary | ICD-10-CM | POA: Diagnosis not present

## 2021-01-29 DIAGNOSIS — E1140 Type 2 diabetes mellitus with diabetic neuropathy, unspecified: Secondary | ICD-10-CM | POA: Diagnosis not present

## 2021-01-29 DIAGNOSIS — E782 Mixed hyperlipidemia: Secondary | ICD-10-CM | POA: Diagnosis not present

## 2021-01-29 NOTE — Progress Notes (Signed)
Okay, thank you!

## 2021-01-29 NOTE — Progress Notes (Signed)
Chronic Care Management Pharmacy Note  02/06/2021 Name:  [name] MRN:  [id number] DOB:  Feb 02, 1966  Subjective: Stephen Chase is an 55 y.o. year old male who is a primary patient of [name], FNP.  The CCM team was consulted for assistance with disease management and care coordination needs.   Spoke with patient using   Engaged with patient by telephone for follow up visit in response to provider referral for pharmacy case management and/or care coordination services.   Consent to Services:  The patient was given information about Chronic Care Management services, agreed to services, and gave verbal consent prior to initiation of services.  Please see initial visit note for detailed documentation.   Patient Care Team: [name], FNP as PCP - General (General Practice) Sheldon Silvan, [place] (Inactive) (Pharmacist)  Recent office visits: 01/20/2021 PCP OV   Recent consult visits: 10/09/2020 Neurology [facility] visits: None in previous 6 months  Objective:  Lab Results  Component Value Date   CREATININE 1.13 01/20/2021   BUN 20 01/20/2021   GFRNONAA 72 10/17/2020   GFRAA 84 10/17/2020   NA 136 01/20/2021   K 4.5 01/20/2021   CALCIUM 10.1 01/20/2021   CO2 21 01/20/2021   GLUCOSE 216 (H) 01/20/2021    Lab Results  Component Value Date/Time   HGBA1C 8.0 (H) 01/20/2021 10:25 AM   HGBA1C 9.2 (H) 10/17/2020 02:37 PM   MICROALBUR 80 04/10/2020 11:49 AM   MICROALBUR 30 03/09/2019 03:09 PM    Last diabetic Eye exam:  Lab Results  Component Value Date/Time   HMDIABEYEEXA No Retinopathy 07/29/2020 12:00 AM    Last diabetic Foot exam: No results found for: HMDIABFOOTEX   Lab Results  Component Value Date   CHOL 166 01/20/2021   HDL 23 (L) 01/20/2021   LDLCALC 55 01/20/2021   TRIG 594 (HH) 01/20/2021   CHOLHDL 7.2 (H) 01/20/2021    Hepatic Function Latest Ref Rng & Units 01/20/2021 10/17/2020 07/17/2020  Total Protein 6.0 - 8.5 g/dL 7.3 7.5 7.4  Albumin  3.8 - 4.9 g/dL 4.9 4.8 4.8  AST 0 - 40 IU/L 29 50(H) 39  ALT 0 - 44 IU/L 36 44 34  Alk Phosphatase 44 - 121 IU/L 81 102 100  Total Bilirubin 0.0 - 1.2 mg/dL 0.5 0.5 0.7  Bilirubin, Direct 0.0 - 0.3 mg/dL - - -    Lab Results  Component Value Date/Time   TSH 1.810 03/09/2019 12:53 PM   FREET4 1.26 03/09/2019 12:53 PM    CBC Latest Ref Rng & Units 02/22/2020 11/23/2019 06/15/2019  WBC 3.4 - 10.8 x10E3/uL 6.1 7.3 6.3  Hemoglobin 13.0 - 17.7 g/dL 53.1 75.2 47.4  Hematocrit 37.5 - 51.0 % 44.0 45.1 45.2  Platelets 150 - 450 x10E3/uL 158 161 140(L)    No results found for: VD25OH  Clinical ASCVD: No  The 10-year ASCVD risk score [name], et al., 2013) is: 14.7%   Values used to calculate the score:     Age: 44 years     Sex: Male     Is Non-Hispanic African American: No     Diabetic: Yes     Tobacco smoker: No     Systolic Blood Pressure: 114 mmHg     Is BP treated: Yes     HDL Cholesterol: 23 mg/dL     Total Cholesterol: 166 mg/dL    Depression screen [facility] 2/9 04/10/2020 04/20/2019 03/09/2019  Decreased Interest 0 0 0  Down,  Depressed, Hopeless 0 0 0  PHQ - 2 Score 0 0 0     Social History   Tobacco Use  Smoking Status Former Smoker  . Packs/day: 1.00  . Years: 30.00  . Pack years: 30.00  . Types: Cigarettes  Smokeless Tobacco Never Used  Tobacco Comment   quit 4 years ago   BP Readings from Last 3 Encounters:  01/20/21 114/60  10/17/20 116/72  10/09/20 111/64   Pulse Readings from Last 3 Encounters:  01/20/21 76  10/17/20 80  10/09/20 86   Wt Readings from Last 3 Encounters:  01/20/21 238 lb 3.2 oz (108 kg)  10/17/20 238 lb 9.6 oz (108.2 kg)  10/09/20 246 lb (111.6 kg)   BMI Readings from Last 3 Encounters:  01/20/21 35.18 kg/m  10/17/20 35.24 kg/m  10/09/20 36.33 kg/m    Assessment/Interventions: Review of patient past medical history, allergies, medications, health status, including review of consultants reports, laboratory and other test data,  was performed as part of comprehensive evaluation and provision of chronic care management services.   SDOH:  (Social Determinants of Health) assessments and interventions performed: No  SDOH Screenings   Alcohol Screen: Not on file  Depression (PHQ2-9): Low Risk   . PHQ-2 Score: 0  Financial Resource Strain: Medium Risk  . Difficulty of Paying Living Expenses: Somewhat hard  Food Insecurity: No Food Insecurity  . Worried About Charity fundraiser in the Last Year: Never true  . Ran Out of Food in the Last Year: Never true  Housing: Not on file  Physical Activity: Inactive  . Days of Exercise per Week: 0 days  . Minutes of Exercise per Session: 0 min  Social Connections: Not on file  Stress: No Stress Concern Present  . Feeling of Stress : Not at all  Tobacco Use: Medium Risk  . Smoking Tobacco Use: Former Smoker  . Smokeless Tobacco Use: Never Used  Transportation Needs: No Transportation Needs  . Lack of Transportation (Medical): No  . Lack of Transportation (Non-Medical): No    CCM Care Plan  No Known Allergies  Medications Reviewed Today    Reviewed by Mayford Knife, RPH (Pharmacist) on 01/29/21 at 1329    Medication Order Taking? Sig Documenting Provider Last Dose Status Informant  Accu-Chek Softclix Lancets lancets [id number]  Use as instructed [name], FNP  Active   aspirin EC 81 MG tablet [id number] Yes Take 81 mg by mouth daily. [provider] Taking Active   atorvastatin (LIPITOR) 40 MG tablet [id number] Yes TAKE 1 TABLET(40 MG) BY MOUTH AT BEDTIME [name], FNP Taking Active   Blood Glucose Monitoring Suppl (ACCU-CHEK GUIDE) w/Device KIT [id number] Yes Use to check blood sugars three times daily E11.9 [name], FNP Taking Active   carvedilol (COREG) 6.25 MG tablet [id number] Yes TAKE 1 TABLET(6.25 MG) BY MOUTH TWICE DAILY WITH A MEAL [name], FNP Taking Active   cholecalciferol (VITAMIN D3) 25 MCG (1000 UNIT) tablet [id number] Yes Take  1,000 Units by mouth daily. [provider] Taking Active Self  Cyanocobalamin (B-12 PO) [id number] Yes Take by mouth daily. [provider] Taking Active Self  cyclobenzaprine (FLEXERIL) 10 MG tablet [id number] Yes Take 1 tablet (10 mg total) by mouth 3 (three) times daily as needed for muscle spasms. [name], FNP Taking Active   fenofibrate (TRICOR) 145 MG tablet [id number] Yes TAKE 1 TABLET(145 MG) BY MOUTH DAILY [name], FNP Taking Active   glucose blood (ACCU-CHEK GUIDE) test strip [id number]  Yes CHECK BLOOD SUGAR 3 TIMES A DAY BEFORE BREAKFAST, LUNCH, AND Andrey Spearman, [place], FNP Taking Active   Icosapent Ethyl (VASCEPA) 1 g CAPS [id number] Yes One bid for elevated triglycerides Rodriguez-Southworth, Sunday Spillers, PA-C Taking Active   Insulin Pen Needle (PEN NEEDLES) 32G X 4 MM MISC [id number] Yes Inject 1 each into the skin daily. Rodriguez-Southworth, Sunday Spillers, PA-C Taking Active   lisinopril (ZESTRIL) 20 MG tablet [id number] Yes Take 1 tablet (20 mg total) by mouth daily. [name], FNP Taking Active   Magnesium 250 MG TABS [id number] Yes Take 1 tablet (250 mg total) by mouth every evening. With evening meals [name], FNP Taking Active   modafinil (PROVIGIL) 200 MG tablet [id number] Yes TAKE 1 TABLET(200 MG) BY MOUTH DAILY Marcial Pacas, MD Taking Active   Multiple Vitamins-Minerals (MULTIVITAMIN WITH MINERALS) tablet [id number] Yes Take 1 tablet by mouth daily. [provider] Taking Active   niacin (NIASPAN) 500 MG CR tablet [id number]  Take 2,000 mg by mouth at bedtime.  [provider]  Active Self  olmesartan-hydrochlorothiazide (BENICAR HCT) 40-25 MG tablet [id number]  Take 1 tablet by mouth daily. [name], FNP  Active   Omega-3 Fatty Acids (FISH OIL) 1000 MG CPDR [id number] Yes Take by mouth. Take one tablet daily [provider] Taking Active   omeprazole (PRILOSEC) 10 MG capsule [id number] Yes Take 1 capsule (10 mg total) by mouth  daily. [name], FNP Taking Active   OVER THE COUNTER MEDICATION [id number] Yes Vitamin D 3 One capsule daily. [provider] Taking Active   Semaglutide, 1 MG/DOSE, (OZEMPIC, 1 MG/DOSE,) 2 MG/1.5ML SOPN [id number] Yes Inject 1 mg into the skin once a week. Inject $RemoveBefo'1mg'OgzjzmPfoLG$  into the skin daily [provider] Taking Active   tamsulosin (FLOMAX) 0.4 MG CAPS capsule [id number] Yes TAKE 1 CAPSULE BY MOUTH EVERY DAY 63 MINUTES AFTER THE SAME MEAL [name], FNP Taking Active                    Patient Active Problem List   Diagnosis Date Noted  . Hypersomnia, persistent 05/09/2018  . Type 2 diabetes mellitus with diabetic neuropathy, without long-term current use of insulin ([place]) 05/09/2018  . Uncontrolled type 2 diabetes mellitus with hyperglycemia ([place]) 05/09/2018  . OSA on CPAP 05/09/2018  . Esophageal varices without bleeding ([place]) 05/09/2018  . Bilateral deafness 05/09/2018    Immunization History  Administered Date(s) Administered  . Hepatitis B, adult 03/03/2017  . Hepb-cpg 07/13/2018, 08/17/2018, 05/12/2019  . Influenza,inj,Quad PF,6+ Mos 07/13/2018, 05/31/2019, 07/17/2020  . Janssen (J&J) SARS-COV-2 Vaccination 12/28/2019  . Moderna Sars-Covid-2 Vaccination 08/06/2020    Conditions to be addressed/monitored:  Hypertension, Hyperlipidemia and Diabetes  Care Plan : [place]  Updates made by Mayford Knife, RPH since 02/06/2021 12:00 AM    Problem: HTN, HLD, DM II     Long-Range Goal: Disease Management   This Visit's Progress: On track  Priority: High  Note:    Current Barriers:  . Unable to independently afford treatment regimen . Unable to independently monitor therapeutic efficacy . Unable to achieve control of lipids.    Pharmacist Clinical Goal(s):  Marland Kitchen Patient will verbalize ability to afford treatment regimen . achieve adherence to monitoring guidelines and medication adherence to achieve therapeutic efficacy through  collaboration with PharmD and provider.   Interventions: . 1:1 collaboration with [name], FNP regarding development and update of comprehensive plan of care as evidenced by provider attestation and co-signature . Inter-disciplinary  care team collaboration (see longitudinal plan of care) . Comprehensive medication review performed; medication list updated in electronic medical record    Hyperlipidemia: (LDL goal < 70) -Not ideally controlled -Current treatment: . Atorvastatin 40 mg tablet once per day . Vascepa 1 gram capsule twice per day - patient reports stopped taking this medication  o Increase to 2 capsules twice per day . Fenofibrate 145 mg tablet once per day  o Patient reports medication is very expensive -Current dietary patterns: Patient reports eating a lot of Flaming Hot Cheetos-Current exercise habits: walking, a few months ago he was not exercising but now he is doing a lot more. He is landscaping, mowing the grass, and he is always walking around  -Educated on Cholesterol goals;  Benefits of statin for ASCVD risk reduction; Importance of limiting foods high in cholesterol; Exercise goal of 150 minutes per week -Recommend continue current medication regimen  Diabetes (A1c goal <7%) -Uncontrolled -Current medications: . Semaglutide 1 mg once a week   -Current home glucose readings: checking BS twice per day, but sometimes he checks it in the afternoon as well . fasting glucose: 195 on 01/29/2021 usually between 190-200  . Afternoon: patient reports glucose is 170  . post prandial glucose: about 150 prior to eating dinner  -Denies hypoglycemic/hyperglycemic symptoms -Current meal patterns:  . breakfast: tortilla, two in the morning  . lunch: patient did not discuss  . dinner: steak-not often, beef, shrimp, chicken - potatoes, rice, beans, corn -small helping, vegetables- green beans, . snacks: yogurt, snacks sometimes, hot flaming cheetos  . drinks: a lot of  water, does not drink much soda, last month he did drink a lot of soda, he has stopped for two weeks now. He drank a 12 ounce can of soda with lunch - Coke Zero  -Current exercise: patient reports mowing the lawn -Educated on A1c and blood sugar goals; Complications of diabetes including kidney damage, retinal damage, and cardiovascular disease; Exercise goal of 150 minutes per week; Benefits of weight loss; Benefits of routine self-monitoring of blood sugar; -Counseled to check feet daily and get yearly eye exams -Recommended to continue current medication   Patient Goals/Self-Care Activities . Patient will:  - take medications as prescribed  Follow Up Plan: Telephone follow up appointment with care management team member scheduled for: 02/26/2021 The patient has been provided with contact information for the care management team and has been advised to call with any health related questions or concerns.       Medication Assistance: Ozempic obtained through [employer] medication assistance program.  Enrollment ends 08/2021.  Patient's preferred pharmacy is:  [facility], [place] [address] Phone: [phone] Fax: [phone]  [id number] - [place], [address] [place] [address] Phone: [phone] Fax: [phone]  Uses pill box? Yes Pt endorses 80% compliance  We discussed: Benefits of medication synchronization, packaging and delivery as well as enhanced pharmacist oversight with [employer]. Patient decided to: Continue current medication management strategy  Care Plan and Follow Up Patient Decision:  Patient agrees to Care Plan and Follow-up.  Plan: Telephone follow up appointment with care management team member scheduled for:  02/26/2021 and The patient has been provided with contact  information for the care management team and has been advised to call with any health related questions or  concerns.   [name], PharmD Clinical Pharmacist [facility] [phone]

## 2021-02-06 NOTE — Patient Instructions (Signed)
Visit Information It was great speaking with you today!  Please let me know if you have any questions about our visit.  Goals Addressed            This Visit's Progress   . Manage My Medicine       Timeframe:  Long-Range Goal Priority:  High Start Date:                             Expected End Date:                       Follow Up Date 02/26/2021   - call for medicine refill 2 or 3 days before it runs out - call if I am sick and can't take my medicine - keep a list of all the medicines I take; vitamins and herbals too - learn to read medicine labels - use a pillbox to sort medicine - use an alarm clock or phone to remind me to take my medicine    Why is this important?   . These steps will help you keep on track with your medicines.          Patient Care Plan: CCM Pharmacy Care Plan    Problem Identified: HTN, HLD, DM II     Long-Range Goal: Disease Management   This Visit's Progress: Not on track  Note:    Current Barriers:  . Unable to independently afford treatment regimen . Unable to independently monitor therapeutic efficacy . Unable to achieve control of lipids.    Pharmacist Clinical Goal(s):  Marland Kitchen Patient will verbalize ability to afford treatment regimen . achieve adherence to monitoring guidelines and medication adherence to achieve therapeutic efficacy through collaboration with PharmD and provider.   Interventions: . 1:1 collaboration with [name], FNP regarding development and update of comprehensive plan of care as evidenced by provider attestation and co-signature . Inter-disciplinary care team collaboration (see longitudinal plan of care) . Comprehensive medication review performed; medication list updated in electronic medical record    Hyperlipidemia: (LDL goal < 70) -Not ideally controlled -Current treatment: . Atorvastatin 40 mg tablet once per day . Vascepa 1 gram capsule twice per day - patient reports stopped taking this medication   o Increase to 2 capsules twice per day . Fenofibrate 145 mg tablet once per day  o Patient reports medication is very expensive -Current dietary patterns: Patient reports eating a lot of Flaming Hot Cheetos-Current exercise habits: walking, a few months ago he was not exercising but now he is doing a lot more. He is landscaping, mowing the grass, and he is always walking around  -Educated on Cholesterol goals;  Benefits of statin for ASCVD risk reduction; Importance of limiting foods high in cholesterol; Exercise goal of 150 minutes per week -Recommend continue current medication regimen  Diabetes (A1c goal <7%) -Uncontrolled -Current medications: . Semaglutide 1 mg once a week   -Current home glucose readings: checking BS twice per day, but sometimes he checks it in the afternoon as well . fasting glucose: 195 on 01/29/2021 usually between 190-200  . Afternoon: patient reports glucose is 170  . post prandial glucose: about 150 prior to eating dinner  -Denies hypoglycemic/hyperglycemic symptoms -Current meal patterns:  . breakfast: tortilla, two in the morning  . lunch: patient did not discuss  . dinner: steak-not often, beef, shrimp, chicken - potatoes, rice, beans, corn -small helping, vegetables- green beans, .  snacks: yogurt, snacks sometimes, hot flaming cheetos  . drinks: a lot of water, does not drink much soda, last month he did drink a lot of soda, he has stopped for two weeks now. He drank a 12 ounce can of soda with lunch - Coke Zero  -Current exercise: patient reports mowing the lawn -Educated on A1c and blood sugar goals; Complications of diabetes including kidney damage, retinal damage, and cardiovascular disease; Exercise goal of 150 minutes per week; Benefits of weight loss; Benefits of routine self-monitoring of blood sugar; -Counseled to check feet daily and get yearly eye exams -Recommended to continue current medication   Patient Goals/Self-Care  Activities . Patient will:  - take medications as prescribed  Follow Up Plan: Telephone follow up appointment with care management team member scheduled for: 02/26/2021 The patient has been provided with contact information for the care management team and has been advised to call with any health related questions or concerns.       Patient agreed to services and verbal consent obtained.   The patient verbalized understanding of instructions, educational materials, and care plan provided today and agreed to receive a mailed copy of patient instructions, educational materials, and care plan.   [name], PharmD Clinical Pharmacist [facility] [phone]

## 2021-02-25 ENCOUNTER — Telehealth: Payer: Self-pay

## 2021-02-25 NOTE — Chronic Care Management (AMB) (Signed)
    Chronic Care Management Pharmacy Assistant   Name: Stephen Chase  MRN: [id number] DOB: Sep 15, 1966  02/25/2021- Patient called to remind of appointment with [name], CPP on 02/26/2021 at 1:00 pm.  Patient has a translator phone system, I was able to leave a voicemail: No answer, left message of appointment date, time and type of appointment (either telephone or in person). Left message to have all medications, supplements, blood pressure and/or blood sugar logs available during appointment and to return call if need to reschedule.   Star Rating Drug: Atorvastatin 40 mg- Last filled 07/12/2020 for 90 day supply at [facility] Lisinopril 20 mg- Last filled 05/17/2020 for 90 day supply at [facility] filled 05/23/2019 for 84 day supply at [facility] Glipizide 5 mg- Last filled 11/14/2018 for 90 days supply at [facility] Metformin ER 500 mg- Last filled 10/20/2019 for 30 day supply at [place]  Any gaps in medications fill history? Yes- Lisinopril, Benicar-HTCZ, Omeprazole, Flomax    SIG: [name], [place] Pharmacist Assistant [phone]

## 2021-02-26 ENCOUNTER — Telehealth: Payer: Self-pay

## 2021-02-27 ENCOUNTER — Other Ambulatory Visit: Payer: Self-pay | Admitting: Nurse Practitioner

## 2021-02-28 DIAGNOSIS — G4733 Obstructive sleep apnea (adult) (pediatric): Secondary | ICD-10-CM | POA: Diagnosis not present

## 2021-03-07 ENCOUNTER — Telehealth: Payer: Self-pay

## 2021-03-07 NOTE — Chronic Care Management (AMB) (Signed)
    Chronic Care Management Pharmacy Assistant   Name: Stephen Chase  MRN: [id number] DOB: 22-Dec-1965  Reason for Encounter: Disease State/ Hypertension, Diabetes   Recent office visits:  None  Recent consult visits:  None  Hospital visits:  None in previous 6 months  Medications: Outpatient Encounter Medications as of 03/07/2021  Medication Sig   Accu-Chek Softclix Lancets lancets Use as instructed   aspirin EC 81 MG tablet Take 81 mg by mouth daily.   atorvastatin (LIPITOR) 40 MG tablet TAKE 1 TABLET(40 MG) BY MOUTH AT BEDTIME   Blood Glucose Monitoring Suppl (ACCU-CHEK GUIDE) w/Device KIT Use to check blood sugars three times daily E11.9   carvedilol (COREG) 6.25 MG tablet TAKE 1 TABLET(6.25 MG) BY MOUTH TWICE DAILY WITH A MEAL   cholecalciferol (VITAMIN D3) 25 MCG (1000 UNIT) tablet Take 1,000 Units by mouth daily.   Cyanocobalamin (B-12 PO) Take by mouth daily.   cyclobenzaprine (FLEXERIL) 10 MG tablet Take 1 tablet (10 mg total) by mouth 3 (three) times daily as needed for muscle spasms.   fenofibrate (TRICOR) 145 MG tablet TAKE 1 TABLET(145 MG) BY MOUTH DAILY   glucose blood (ACCU-CHEK GUIDE) test strip CHECK BLOOD SUGAR 3 TIMES A DAY BEFORE BREAKFAST, LUNCH, AND DINNER   Icosapent Ethyl (VASCEPA) 1 g CAPS One bid for elevated triglycerides   Insulin Pen Needle (PEN NEEDLES) 32G X 4 MM MISC Inject 1 each into the skin daily.   lisinopril (ZESTRIL) 20 MG tablet Take 1 tablet (20 mg total) by mouth daily.   Magnesium 250 MG TABS Take 1 tablet (250 mg total) by mouth every evening. With evening meals   modafinil (PROVIGIL) 200 MG tablet TAKE 1 TABLET(200 MG) BY MOUTH DAILY   Multiple Vitamins-Minerals (MULTIVITAMIN WITH MINERALS) tablet Take 1 tablet by mouth daily.   niacin (NIASPAN) 500 MG CR tablet Take 2,000 mg by mouth at bedtime.    olmesartan-hydrochlorothiazide (BENICAR HCT) 40-25 MG tablet Take 1 tablet by mouth daily.   Omega-3 Fatty Acids (FISH OIL) 1000 MG CPDR Take  by mouth. Take one tablet daily   omeprazole (PRILOSEC) 10 MG capsule Take 1 capsule (10 mg total) by mouth daily.   OVER THE COUNTER MEDICATION Vitamin D 3 One capsule daily.   Semaglutide, 1 MG/DOSE, (OZEMPIC, 1 MG/DOSE,) 2 MG/1.5ML SOPN Inject 1 mg into the skin once a week. Inject $RemoveBefo'1mg'tEtbgeeIEjC$  into the skin daily   tamsulosin (FLOMAX) 0.4 MG CAPS capsule TAKE 1 CAPSULE BY MOUTH EVERY DAY 30 MINUTES AFTER THE SAME MEAL   UNABLE TO FIND CPAP for OSA (6cm-19cm)   No facility-administered encounter medications on file as of 03/07/2021.     03-07-2021: 1st attempt Left VM on both lines 03-19-2021: 2nd attempt LVM 03-20-2021: 3rd attempt LVM on both lines  Star Rating Drugs: Lisinopril 20 mg- Last filled 05-17-2020 90 DS [facility] Atorvastatin 40 mg- Last filled 07-11-2020 90 DS [facility] Olmesartan-HCTZ 40-25 mg- No fill history Ozempic 1 mg- Patient [place] Pharmacist Assistant [phone]

## 2021-03-10 ENCOUNTER — Other Ambulatory Visit: Payer: Self-pay | Admitting: Nurse Practitioner

## 2021-03-28 ENCOUNTER — Other Ambulatory Visit: Payer: Self-pay | Admitting: Nurse Practitioner

## 2021-04-01 ENCOUNTER — Telehealth: Payer: Self-pay

## 2021-04-01 NOTE — Chronic Care Management (AMB) (Signed)
    Chronic Care Management Pharmacy Assistant   Name: Stephen Chase  MRN: [id number] DOB: November 24, 1965  Reason for Encounter: Patient Assistance Coordination  04/01/2021- Patient Assistance reorder form filled out with [employer] patient assistance program for [employer]. Printed form awaiting provider signature to fax.  0/30/0923- Application received, faxed to [employer] patient assistance program.   Medications: Outpatient Encounter Medications as of 04/01/2021  Medication Sig   Accu-Chek Softclix Lancets lancets Use as instructed   aspirin EC 81 MG tablet Take 81 mg by mouth daily.   atorvastatin (LIPITOR) 40 MG tablet TAKE 1 TABLET(40 MG) BY MOUTH AT BEDTIME   Blood Glucose Monitoring Suppl (ACCU-CHEK GUIDE) w/Device KIT Use to check blood sugars three times daily E11.9   carvedilol (COREG) 6.25 MG tablet TAKE 1 TABLET(6.25 MG) BY MOUTH TWICE DAILY WITH A MEAL   cholecalciferol (VITAMIN D3) 25 MCG (1000 UNIT) tablet Take 1,000 Units by mouth daily.   Cyanocobalamin (B-12 PO) Take by mouth daily.   cyclobenzaprine (FLEXERIL) 10 MG tablet Take 1 tablet (10 mg total) by mouth 3 (three) times daily as needed for muscle spasms.   fenofibrate (TRICOR) 145 MG tablet TAKE 1 TABLET(145 MG) BY MOUTH DAILY   glucose blood (ACCU-CHEK GUIDE) test strip CHECK BLOOD SUGAR 3 TIMES A DAY BEFORE BREAKFAST, LUNCH, AND DINNER   Icosapent Ethyl (VASCEPA) 1 g CAPS One bid for elevated triglycerides   Insulin Pen Needle (PEN NEEDLES) 32G X 4 MM MISC Inject 1 each into the skin daily.   lisinopril (ZESTRIL) 20 MG tablet TAKE 1 TABLET(20 MG) BY MOUTH DAILY   Magnesium 250 MG TABS Take 1 tablet (250 mg total) by mouth every evening. With evening meals   modafinil (PROVIGIL) 200 MG tablet TAKE 1 TABLET(200 MG) BY MOUTH DAILY   Multiple Vitamins-Minerals (MULTIVITAMIN WITH MINERALS) tablet Take 1 tablet by mouth daily.   niacin (NIASPAN) 500 MG CR tablet Take 2,000 mg by mouth at bedtime.     olmesartan-hydrochlorothiazide (BENICAR HCT) 40-25 MG tablet Take 1 tablet by mouth daily.   Omega-3 Fatty Acids (FISH OIL) 1000 MG CPDR Take by mouth. Take one tablet daily   omeprazole (PRILOSEC) 10 MG capsule Take 1 capsule (10 mg total) by mouth daily.   OVER THE COUNTER MEDICATION Vitamin D 3 One capsule daily.   Semaglutide, 1 MG/DOSE, (OZEMPIC, 1 MG/DOSE,) 2 MG/1.5ML SOPN Inject 1 mg into the skin once a week. Inject 48m into the skin daily   tamsulosin (FLOMAX) 0.4 MG CAPS capsule TAKE 1 CAPSULE BY MOUTH EVERY DAY 30 MINUTES AFTER THE SAME MEAL   UNABLE TO FIND CPAP for OSA (6cm-19cm)   No facility-administered encounter medications on file as of 04/01/2021.    Care Gaps: PNEUMOCOCCAL POLYSACCHARIDE VACCINE AGE 17-64 HIGH RISK- Overdue TETANUS/TDAP (Every 10 Years) Zoster Vaccines- Shingrix (1 of 2) COVID-19 Vaccine (3 - Booster for [employer]) Annual Wellness Visit Scheduled 04/17/2021  Star Rating Drugs: Ozempic- Last filled 05/23/2019- PAP Olmesartan-Hctz 40/25 mg- Last ref[facility] 04/02/2021-to [facility]  fill history not available Lisinopril 20 mg- Last filled 04/27/2020 ([facility]- Last ref[facility] 04/01/2021-to [facility]  fill history not available Atorvastatin 40 mg- Last filled 07/12/2020 ([facility]- Last ref[facility] 02[place]28/2021-to [facility]  fill history not available    TPattricia Boss CUnionPharmacist Assistant [phone]

## 2021-04-02 ENCOUNTER — Other Ambulatory Visit: Payer: Self-pay | Admitting: Nurse Practitioner

## 2021-04-03 ENCOUNTER — Telehealth: Payer: Self-pay

## 2021-04-03 NOTE — Chronic Care Management (AMB) (Signed)
Chronic Care Management Pharmacy Assistant   Name: Stephen Chase  MRN: [id number] DOB: [birth year]   Reason for Encounter: Disease State/ Hypertension  Recent office visits:  None  Recent consult visits:  None  Hospital visits:  None in previous 6 months  Medications: Outpatient Encounter Medications as of 04/03/2021  Medication Sig   Accu-Chek Softclix Lancets lancets Use as instructed   aspirin EC 81 MG tablet Take 81 mg by mouth daily.   atorvastatin (LIPITOR) 40 MG tablet TAKE 1 TABLET(40 MG) BY MOUTH AT BEDTIME   Blood Glucose Monitoring Suppl (ACCU-CHEK GUIDE) w/Device KIT Use to check blood sugars three times daily E11.9   carvedilol (COREG) 6.25 MG tablet TAKE 1 TABLET(6.25 MG) BY MOUTH TWICE DAILY WITH A MEAL   cholecalciferol (VITAMIN D3) 25 MCG (1000 UNIT) tablet Take 1,000 Units by mouth daily.   Cyanocobalamin (B-12 PO) Take by mouth daily.   cyclobenzaprine (FLEXERIL) 10 MG tablet Take 1 tablet (10 mg total) by mouth 3 (three) times daily as needed for muscle spasms.   fenofibrate (TRICOR) 145 MG tablet TAKE 1 TABLET(145 MG) BY MOUTH DAILY   glucose blood (ACCU-CHEK GUIDE) test strip CHECK BLOOD SUGAR 3 TIMES A DAY BEFORE BREAKFAST, LUNCH, AND DINNER   Icosapent Ethyl (VASCEPA) 1 g CAPS One bid for elevated triglycerides   Insulin Pen Needle (PEN NEEDLES) 32G X 4 MM MISC Inject 1 each into the skin daily.   lisinopril (ZESTRIL) 20 MG tablet TAKE 1 TABLET(20 MG) BY MOUTH DAILY   Magnesium 250 MG TABS Take 1 tablet (250 mg total) by mouth every evening. With evening meals   modafinil (PROVIGIL) 200 MG tablet TAKE 1 TABLET(200 MG) BY MOUTH DAILY   Multiple Vitamins-Minerals (MULTIVITAMIN WITH MINERALS) tablet Take 1 tablet by mouth daily.   niacin (NIASPAN) 500 MG CR tablet Take 2,000 mg by mouth at bedtime.    olmesartan-hydrochlorothiazide (BENICAR HCT) 40-25 MG tablet TAKE 1 TABLET BY MOUTH DAILY   Omega-3 Fatty Acids (FISH OIL) 1000 MG CPDR Take by mouth. Take one  tablet daily   omeprazole (PRILOSEC) 10 MG capsule Take 1 capsule (10 mg total) by mouth daily.   OVER THE COUNTER MEDICATION Vitamin D 3 One capsule daily.   Semaglutide, 1 MG/DOSE, (OZEMPIC, 1 MG/DOSE,) 2 MG/1.5ML SOPN Inject 1 mg into the skin once a week. Inject $RemoveBefo'1mg'bWBEGFHHcPY$  into the skin daily   tamsulosin (FLOMAX) 0.4 MG CAPS capsule TAKE 1 CAPSULE BY MOUTH EVERY DAY 30 MINUTES AFTER THE SAME MEAL   UNABLE TO FIND CPAP for OSA (6cm-19cm)   No facility-administered encounter medications on file as of 04/03/2021.   Reviewed chart prior to disease state call. Spoke with patient regarding BP  Recent Office Vitals: BP Readings from Last 3 Encounters:  01/20/21 114/60  10/17/20 116/72  10/09/20 111/64   Pulse Readings from Last 3 Encounters:  01/20/21 76  10/17/20 80  10/09/20 86    Wt Readings from Last 3 Encounters:  01/20/21 238 lb 3.2 oz (108 kg)  10/17/20 238 lb 9.6 oz (108.2 kg)  10/09/20 246 lb (111.6 kg)      04-03-2021: 1st attempt Left VM on both lines 04-11-2021: 2nd attempt Left VM on both lines 04-14-2021: 3rd attempt Left VM on both lines  Care Gaps: Tdap overdue Shingrix overdue Covid booster overdue RAF= 1.549% Medicare Wellness 04-17-2021  Star Rating Drugs: Ozempic 1 mg- Patient Assistance Olmesartan-HCTZ 40-25 mg- Last filled 04-02-2021 90 [facility] Atorvastatin 40 mg- Last filled 02-28-2021 90  DS [facility] Lisinopril 20 mg- Last filled 04-01-2021 90 DS [place] Clinical Pharmacist Assistant [phone]

## 2021-04-17 ENCOUNTER — Ambulatory Visit (INDEPENDENT_AMBULATORY_CARE_PROVIDER_SITE_OTHER): Payer: Medicare PPO

## 2021-04-17 ENCOUNTER — Other Ambulatory Visit: Payer: Self-pay

## 2021-04-17 ENCOUNTER — Encounter: Payer: Self-pay | Admitting: Nurse Practitioner

## 2021-04-17 ENCOUNTER — Ambulatory Visit: Payer: Medicare PPO | Admitting: Nurse Practitioner

## 2021-04-17 VITALS — BP 110/50 | HR 96 | Temp 98.6°F | Ht 69.2 in | Wt 239.8 lb

## 2021-04-17 VITALS — BP 110/50 | HR 96 | Temp 98.6°F | Ht 69.0 in | Wt 238.0 lb

## 2021-04-17 DIAGNOSIS — I10 Essential (primary) hypertension: Secondary | ICD-10-CM | POA: Diagnosis not present

## 2021-04-17 DIAGNOSIS — Z23 Encounter for immunization: Secondary | ICD-10-CM | POA: Diagnosis not present

## 2021-04-17 DIAGNOSIS — G4733 Obstructive sleep apnea (adult) (pediatric): Secondary | ICD-10-CM | POA: Diagnosis not present

## 2021-04-17 DIAGNOSIS — E1140 Type 2 diabetes mellitus with diabetic neuropathy, unspecified: Secondary | ICD-10-CM

## 2021-04-17 DIAGNOSIS — Z0000 Encounter for general adult medical examination without abnormal findings: Secondary | ICD-10-CM | POA: Diagnosis not present

## 2021-04-17 DIAGNOSIS — Z6835 Body mass index (BMI) 35.0-35.9, adult: Secondary | ICD-10-CM | POA: Diagnosis not present

## 2021-04-17 DIAGNOSIS — Z9989 Dependence on other enabling machines and devices: Secondary | ICD-10-CM | POA: Diagnosis not present

## 2021-04-17 DIAGNOSIS — E782 Mixed hyperlipidemia: Secondary | ICD-10-CM

## 2021-04-17 DIAGNOSIS — G8929 Other chronic pain: Secondary | ICD-10-CM | POA: Diagnosis not present

## 2021-04-17 DIAGNOSIS — M545 Low back pain, unspecified: Secondary | ICD-10-CM

## 2021-04-17 LAB — POCT URINALYSIS DIPSTICK
Bilirubin, UA: NEGATIVE
Blood, UA: NEGATIVE
Glucose, UA: NEGATIVE
Ketones, UA: NEGATIVE
Leukocytes, UA: NEGATIVE
Nitrite, UA: NEGATIVE
Protein, UA: NEGATIVE
Spec Grav, UA: 1.025 (ref 1.010–1.025)
Urobilinogen, UA: 0.2 E.U./dL
pH, UA: 5.5 (ref 5.0–8.0)

## 2021-04-17 LAB — POCT UA - MICROALBUMIN
Albumin/Creatinine Ratio, Urine, POC: <30
Creatinine, POC: 300 mg/dL
Microalbumin Ur, POC: 10 mg/L

## 2021-04-17 MED ORDER — FENOFIBRATE 145 MG PO TABS: ORAL_TABLET | ORAL | 1 refills | Status: DC

## 2021-04-17 MED ORDER — OZEMPIC (2 MG/DOSE) 8 MG/3ML ~~LOC~~ SOPN: 2.0000 mg | PEN_INJECTOR | SUBCUTANEOUS | 1 refills | Status: DC

## 2021-04-17 MED ORDER — TETANUS-DIPHTHERIA TOXOIDS TD 2-2 LF/0.5ML IM SUSP: 0.5000 mL | Freq: Once | INTRAMUSCULAR | 0 refills | Status: AC

## 2021-04-17 MED ORDER — SHINGRIX 50 MCG/0.5ML IM SUSR: 0.5000 mL | Freq: Once | INTRAMUSCULAR | 0 refills | Status: AC

## 2021-04-17 NOTE — Progress Notes (Signed)
This visit occurred during the SARS-CoV-2 public health emergency.  Safety protocols were in place, including screening questions prior to the visit, additional usage of staff PPE, and extensive cleaning of exam room while observing appropriate contact time as indicated for disinfecting solutions.  Subjective:   Stephen Chase is a 55 y.o. male who presents for Medicare Annual/Subsequent preventive examination.  Review of Systems     Cardiac Risk Factors include: diabetes mellitus     Objective:    Today's Vitals   04/17/21 0906 04/17/21 0914  BP: (!) 110/50   Pulse: 96   Temp: 98.6 F (37 C)   TempSrc: Oral   SpO2: 99%   Weight: 239 lb 12.8 oz (108.8 kg)   Height: 5' 9.2" (1.758 m)   PainSc:  8    Body mass index is 35.21 kg/m.  Advanced Directives 04/17/2021 04/10/2020 03/09/2019 12/19/2013  Does Patient Have a Medical Advance Directive? Yes Yes No Patient does not have advance directive;Patient would like information  Type of Scientist, forensic Power of [place];Living will - -  Copy of [place] in Chart? Yes - validated most recent copy scanned in chart (See row information) Yes - validated most recent copy scanned in chart (See row information) - -  Would patient like information on creating a medical advance directive? - - No - Patient declined Advance directive packet given    Current Medications (verified) Outpatient Encounter Medications as of 04/17/2021  Medication Sig   Accu-Chek Softclix Lancets lancets Use as instructed   aspirin EC 81 MG tablet Take 81 mg by mouth daily.   atorvastatin (LIPITOR) 40 MG tablet TAKE 1 TABLET(40 MG) BY MOUTH AT BEDTIME   Blood Glucose Monitoring Suppl (ACCU-CHEK GUIDE) w/Device KIT Use to check blood sugars three times daily E11.9   carvedilol (COREG) 6.25 MG tablet TAKE 1 TABLET(6.25 MG) BY MOUTH TWICE DAILY WITH A MEAL   cholecalciferol (VITAMIN D3) 25 MCG (1000 UNIT) tablet  Take 1,000 Units by mouth daily.   Cyanocobalamin (B-12 PO) Take by mouth daily.   cyclobenzaprine (FLEXERIL) 10 MG tablet Take 1 tablet (10 mg total) by mouth 3 (three) times daily as needed for muscle spasms.   fenofibrate (TRICOR) 145 MG tablet TAKE 1 TABLET(145 MG) BY MOUTH DAILY   glucose blood (ACCU-CHEK GUIDE) test strip CHECK BLOOD SUGAR 3 TIMES A DAY BEFORE BREAKFAST, LUNCH, AND DINNER   Icosapent Ethyl (VASCEPA) 1 g CAPS One bid for elevated triglycerides   Insulin Pen Needle (PEN NEEDLES) 32G X 4 MM MISC Inject 1 each into the skin daily.   lisinopril (ZESTRIL) 20 MG tablet TAKE 1 TABLET(20 MG) BY MOUTH DAILY   Magnesium 250 MG TABS Take 1 tablet (250 mg total) by mouth every evening. With evening meals   modafinil (PROVIGIL) 200 MG tablet TAKE 1 TABLET(200 MG) BY MOUTH DAILY   Multiple Vitamins-Minerals (MULTIVITAMIN WITH MINERALS) tablet Take 1 tablet by mouth daily.   niacin (NIASPAN) 500 MG CR tablet Take 2,000 mg by mouth at bedtime.    olmesartan-hydrochlorothiazide (BENICAR HCT) 40-25 MG tablet TAKE 1 TABLET BY MOUTH DAILY   Omega-3 Fatty Acids (FISH OIL) 1000 MG CPDR Take by mouth. Take one tablet daily   omeprazole (PRILOSEC) 10 MG capsule Take 1 capsule (10 mg total) by mouth daily.   OVER THE COUNTER MEDICATION Vitamin D 3 One capsule daily.   Semaglutide, 1 MG/DOSE, (OZEMPIC, 1 MG/DOSE,) 2 MG/1.5ML SOPN Inject 1 mg into the  skin once a week. Inject $RemoveBefo'1mg'VtqjgLIuNVv$  into the skin daily   tamsulosin (FLOMAX) 0.4 MG CAPS capsule TAKE 1 CAPSULE BY MOUTH EVERY DAY 30 MINUTES AFTER THE SAME MEAL   UNABLE TO FIND CPAP for OSA (6cm-19cm)   No facility-administered encounter medications on file as of 04/17/2021.    Allergies (verified) Patient has no known allergies.   History: Past Medical History:  Diagnosis Date   Allergy    Arthritis    SHOULDERS   Balanitis    Deafness, sensorineural, childhood onset    BILATERAL SINCE AGE 61  SECONDARY TO UNKNOWN ILLNESS   GERD  (gastroesophageal reflux disease)    Hyperlipidemia    Hypertension    No blood products    Patient is Jehovah Witness no blood products.   OSA on CPAP    SEVERE PER STUDY 03-09-2009   Sleep apnea    uses a cpap   Smokers' cough ([place])    Type 2 diabetes mellitus ([place])    Past Surgical History:  Procedure Laterality Date   CIRCUMCISION N/A 12/19/2013   Procedure: CIRCUMCISION ADULT;  Surgeon: Lowella Bandy, MD;  Location: [facility];  Service: Urology;  Laterality: N/A;   SHOULDER SURGERY Right    WRIST SURGERY Left    Family History  Problem Relation Age of Onset   Cancer Mother        type unknown pt was 56 years old at that time   Liver disease Father    Healthy Brother    Cancer Other         alot of family members have cancer but doesnt know the type   Colon cancer Neg Hx    Esophageal cancer Neg Hx    Rectal cancer Neg Hx    Stomach cancer Neg Hx    Social History   Socioeconomic History   Marital status: Married    Spouse name: Not on file   Number of children: 2   Years of education: Not on file   Highest education level: Not on file  Occupational History   Occupation: unemployed  Tobacco Use   Smoking status: Former    Packs/day: 1.00    Years: 30.00    Pack years: 30.00    Types: Cigarettes   Smokeless tobacco: Never   Tobacco comments:    quit 4 years ago  Vaping Use   Vaping Use: Never used  Substance and Sexual Activity   Alcohol use: Not Currently    Comment: rarely   Drug use: No   Sexual activity: Yes  Other Topics Concern   Not on file  Social History Narrative   Not on file   Social Determinants of Health   Financial Resource Strain: Low Risk    Difficulty of Paying Living Expenses: Not hard at all  Food Insecurity: No Food Insecurity   Worried About Charity fundraiser in the Last Year: Never true   [place] in the Last Year: Never true  Transportation Needs: No Transportation Needs   Lack of Transportation  (Medical): No   Lack of Transportation (Non-Medical): No  Physical Activity: Inactive   Days of Exercise per Week: 0 days   Minutes of Exercise per Session: 0 min  Stress: No Stress Concern Present   Feeling of Stress : Not at all  Social Connections: Not on file    Tobacco Counseling Counseling given: Not Answered Tobacco comments: quit 4 years ago   Clinical Intake:  Pre-visit preparation completed: Yes  Pain : 0-10 Pain Score: 8  Pain Type: Chronic pain Pain Location: Back Pain Orientation: Lower Pain Descriptors / Indicators: Sharp, Dull, Constant Pain Onset: More than a month ago Pain Frequency: Constant     Nutritional Status: BMI > 30  Obese Nutritional Risks: Nausea/ vomitting/ diarrhea (vomited last week due to heat) Diabetes: Yes  How often do you need to have someone help you when you read instructions, pamphlets, or other written materials from your doctor or pharmacy?: 1 - Never  Diabetic? Yes Nutrition Risk Assessment:  Has the patient had any N/V/D within the last 2 months?  Yes  Does the patient have any non-healing wounds?  No  Has the patient had any unintentional weight loss or weight gain?  No   Diabetes:  Is the patient diabetic?  Yes  If diabetic, was a CBG obtained today?  No  Did the patient bring in their glucometer from home?  No  How often do you monitor your CBG's? daily.   Financial Strains and Diabetes Management:  Are you having any financial strains with the device, your supplies or your medication? No .  Does the patient want to be seen by Chronic Care Management for management of their diabetes?  No  Would the patient like to be referred to a Nutritionist or for Diabetic Management?  No   Diabetic Exams:  Diabetic Eye Exam: Completed 07/29/2020 Diabetic Foot Exam: Completed 07/17/2020   Interpreter Needed?: Yes Interpreter Agency: [place] Interpreter Name: Hermelinda Medicus  Information entered by :: NAllen  LPN   Activities of Daily Living In your present state of health, do you have any difficulty performing the following activities: 04/17/2021  Hearing? Y  Vision? N  Difficulty concentrating or making decisions? N  Walking or climbing stairs? N  Dressing or bathing? N  Doing errands, shopping? N  Preparing Food and eating ? N  Using the Toilet? N  In the past six months, have you accidently leaked urine? N  Do you have problems with loss of bowel control? N  Managing your Medications? Y  Comment wife manages  Managing your Finances? N  Housekeeping or managing your Housekeeping? N  Some recent data might be hidden    Patient Care Team: [name], FNP as PCP - General ([place]) [place], Kennieth Francois, [facility] (Inactive) (Pharmacist)  Indicate any recent Medical Services you may have received from other than [facility] providers in the past year (date may be approximate).     Assessment:   This is a routine wellness examination for [place].  Hearing/Vision screen No results found.  Dietary issues and exercise activities discussed: Current Exercise Habits: The patient has a physically strenuous job, but has no regular exercise apart from work.   Goals Addressed             This Visit's Progress    Patient Stated       04/17/2021, no golas       Depression Screen PHQ 2/9 Scores 04/17/2021 04/10/2020 04/20/2019 03/09/2019 03/09/2019 11/22/2018 11/15/2018  PHQ - 2 Score 0 0 0 0 0 0 0    Fall Risk Fall Risk  04/17/2021 04/10/2020 06/15/2019 04/20/2019 03/09/2019  Falls in the past year? 1 0 0 0 0  Comment did not clear a step - - - -  Number falls in past yr: 0 - - - -  Injury with Fall? 0 - - - -  Risk for fall due  to : Medication side effect Medication side effect - - Medication side effect  Follow up Falls evaluation completed;Education provided;Falls prevention discussed Falls evaluation completed;Education provided;Falls prevention discussed - - Education provided;Falls  prevention discussed    FALL RISK PREVENTION PERTAINING TO THE HOME:  Any stairs in or around the home? Yes  If so, are there any without handrails? No  Home free of loose throw rugs in walkways, pet beds, electrical cords, etc? Yes  Adequate lighting in your home to reduce risk of falls? Yes   ASSISTIVE DEVICES UTILIZED TO PREVENT FALLS:  Life alert? No  Use of a cane, walker or w/c? No  Grab bars in the bathroom? Yes  Shower chair or bench in shower? No  Elevated toilet seat or a handicapped toilet? Yes   TIMED UP AND GO:  Was the test performed? No .    Gait steady and fast without use of assistive device  Cognitive Function:        Immunizations Immunization History  Administered Date(s) Administered   Hepatitis B, adult 03/03/2017   Hepb-cpg 07/13/2018, 08/17/2018, 05/12/2019   Influenza,inj,Quad PF,6+ Mos 07/13/2018, 05/31/2019, 07/17/2020   Janssen (J&J) SARS-COV-2 Vaccination 12/28/2019   Moderna Sars-Covid-2 Vaccination 08/06/2020    TDAP status: Due, Education has been provided regarding the importance of this vaccine. Advised may receive this vaccine at local pharmacy or Health Dept. Aware to provide a copy of the vaccination record if obtained from local pharmacy or Health Dept. Verbalized acceptance and understanding.  Flu Vaccine status: Up to date  Pneumococcal vaccine status: Due, Education has been provided regarding the importance of this vaccine. Advised may receive this vaccine at local pharmacy or Health Dept. Aware to provide a copy of the vaccination record if obtained from local pharmacy or Health Dept. Verbalized acceptance and understanding.  Covid-19 vaccine status: Completed vaccines  Qualifies for Shingles Vaccine? Yes   Zostavax completed No   Shingrix Completed?: No.    Education has been provided regarding the importance of this vaccine. Patient has been advised to call insurance company to determine out of pocket expense if they have  not yet received this vaccine. Advised may also receive vaccine at local pharmacy or Health Dept. Verbalized acceptance and understanding.  Screening Tests Health Maintenance  Topic Date Due   PNEUMOCOCCAL POLYSACCHARIDE VACCINE AGE 98-64 HIGH RISK  Never done   TETANUS/TDAP  Never done   Zoster Vaccines- Shingrix (1 of 2) Never done   COVID-19 Vaccine (3 - Booster for Janssen series) 12/04/2020   INFLUENZA VACCINE  04/21/2021   FOOT EXAM  07/17/2021   HEMOGLOBIN A1C  07/23/2021   OPHTHALMOLOGY EXAM  07/29/2021   COLONOSCOPY (Pts 45-24yrs Insurance coverage will need to be confirmed)  02/03/2022   Hepatitis C Screening  Completed   HIV Screening  Completed   Pneumococcal Vaccine 62-50 Years old  Aged Out   HPV [place] Maintenance Due  Topic Date Due   PNEUMOCOCCAL POLYSACCHARIDE VACCINE AGE 98-64 HIGH RISK  Never done   TETANUS/TDAP  Never done   Zoster Vaccines- Shingrix (1 of 2) Never done   COVID-19 Vaccine (3 - Booster for Janssen series) 12/04/2020    Colorectal cancer screening: Type of screening: Colonoscopy. Completed 02/03/2017. Repeat every 5 years  Lung Cancer Screening: (Low Dose CT Chest recommended if Age 28-80 years, 30 pack-year currently smoking OR have quit w/in 15years.) does not qualify.   Lung Cancer Screening Referral:  no  Additional Screening:  Hepatitis C Screening: does qualify; Completed 02/05/2017  Vision Screening: Recommended annual ophthalmology exams for early detection of glaucoma and other disorders of the eye. Is the patient up to date with their annual eye exam?  Yes  Who is the provider or what is the name of the office in which the patient attends annual eye exams? Does not remember name If pt is not established with a provider, would they like to be referred to a provider to establish care? No .   Dental Screening: Recommended annual dental exams for proper oral hygiene  Community Resource  Referral / Chronic Care Management: CRR required this visit?  No   CCM required this visit?  No      Plan:     I have personally reviewed and noted the following in the patient's chart:   Medical and social history Use of alcohol, tobacco or illicit drugs  Current medications and supplements including opioid prescriptions. Patient is not currently taking opioid prescriptions. Functional ability and status Nutritional status Physical activity Advanced directives List of other physicians Hospitalizations, surgeries, and ER visits in previous 12 months Vitals Screenings to include cognitive, depression, and falls Referrals and appointments  In addition, I have reviewed and discussed with patient certain preventive protocols, quality metrics, and best practice recommendations. A written personalized care plan for preventive services as well as general preventive health recommendations were provided to patient.     Kellie Simmering, LPN   [id number]   Nurse Notes:

## 2021-04-17 NOTE — Patient Instructions (Signed)

## 2021-04-17 NOTE — Addendum Note (Signed)
Addended by: Glenna Durand E on: 04/17/2021 11:14 AM   Modules accepted: Orders

## 2021-04-17 NOTE — Progress Notes (Signed)
I,[name],acting as a Education administrator for [name], FNP.,have documented all relevant documentation on the behalf of [name], FNP,as directed by  [name], FNP while in the presence of [name], [place].  This visit occurred during the SARS-CoV-2 public health emergency.  Safety protocols were in place, including screening questions prior to the visit, additional usage of staff PPE, and extensive cleaning of exam room while observing appropriate contact time as indicated for disinfecting solutions.  Subjective:     Patient ID: Stephen Chase , male    DOB: 04-01-1966 , 55 y.o.   MRN: [id number]   Chief Complaint  Patient presents with   Hypertension   Diabetes     HPI  Patient presents today for a diabetes and blood pressure check. He stated his blood sugars have been averaging around 200s. He stated he is checking his sugars about 3 times a day. He continues taking Ozempic. Reports he is outside walking a lot. He has seen an orthopedic found arthritis to his low back, smaller areas between discs he did not get a steroid. Occasionally will have electrical shock pain. L3-L5 disc is flattened and has arthritis between the disc, once pain goes away and get back at it. He plans to go back to the back doctor. Feels like steroids will cause him to gain weight.   Wt Readings from Last 3 Encounters: 01/20/21 : 238 lb 3.2 oz (108 kg) 10/17/20 : 238 lb 9.6 oz (108.2 kg) 10/09/20 : 246 lb (111.6 kg)  Deaf interpreter Anderson Malta is present during visit    He is here with his deaf interpreter.    Diabetes He presents for his follow-up diabetic visit. He has type 2 diabetes mellitus. His disease course has been stable (not much improved). Pertinent negatives for hypoglycemia include no dizziness or headaches. Pertinent negatives for diabetes include no chest pain, no polydipsia, no polyphagia and no polyuria. There are no hypoglycemic complications. Symptoms are stable. There are no diabetic  complications. Risk factors for coronary artery disease include obesity and sedentary lifestyle. Current diabetic treatment includes oral agent (dual therapy). His weight is stable. He is following a generally unhealthy diet. When asked about meal planning, he reported none. He has not had a previous visit with a dietitian. He participates in exercise intermittently (he is walking alot and working a strenuous job.). There is no change in his home blood glucose trend. (Average blood sugar 198 over 90 days.  Does admit to eating more junk for meals due to his wife is not able to stand for long periods. No longer eating red meat. ) An ACE inhibitor/angiotensin II receptor blocker is being taken. Eye exam is current.  Hypertension This is a chronic problem. The current episode started more than 1 year ago. The problem has been gradually improving since onset. The problem is controlled. Pertinent negatives include no chest pain, headaches or palpitations. There are no associated agents to hypertension. Risk factors for coronary artery disease include obesity, sedentary lifestyle, dyslipidemia, diabetes mellitus and male gender. Past treatments include ACE inhibitors. There are no compliance problems.  There is no history of chronic renal disease.    Past Medical History:  Diagnosis Date   Allergy    Arthritis    SHOULDERS   Balanitis    Deafness, sensorineural, childhood onset    BILATERAL SINCE AGE 71  SECONDARY TO UNKNOWN ILLNESS   GERD (gastroesophageal reflux disease)    Hyperlipidemia    Hypertension    No blood  products    Patient is Jehovah Witness no blood products.   OSA on CPAP    SEVERE PER STUDY 03-09-2009   Sleep apnea    uses a cpap   Smokers' cough ([place])    Type 2 diabetes mellitus ([place])      Family History  Problem Relation Age of Onset   Cancer Mother        type unknown pt was 59 years old at that time   Liver disease Father    Healthy Brother    Cancer Other         alot  of family members have cancer but doesnt know the type   Colon cancer Neg Hx    Esophageal cancer Neg Hx    Rectal cancer Neg Hx    Stomach cancer Neg Hx      Current Outpatient Medications:    Accu-Chek Softclix Lancets lancets, Use as instructed, Disp: 200 each, Rfl: 3   aspirin EC 81 MG tablet, Take 81 mg by mouth daily., Disp: , Rfl:    atorvastatin (LIPITOR) 40 MG tablet, TAKE 1 TABLET(40 MG) BY MOUTH AT BEDTIME, Disp: 90 tablet, Rfl: 1   Blood Glucose Monitoring Suppl (ACCU-CHEK GUIDE) w/Device KIT, Use to check blood sugars three times daily E11.9, Disp: 1 kit, Rfl: 0   carvedilol (COREG) 6.25 MG tablet, TAKE 1 TABLET(6.25 MG) BY MOUTH TWICE DAILY WITH A MEAL, Disp: 180 tablet, Rfl: 1   cholecalciferol (VITAMIN D3) 25 MCG (1000 UNIT) tablet, Take 1,000 Units by mouth daily., Disp: , Rfl:    Cyanocobalamin (B-12 PO), Take by mouth daily., Disp: , Rfl:    cyclobenzaprine (FLEXERIL) 10 MG tablet, Take 1 tablet (10 mg total) by mouth 3 (three) times daily as needed for muscle spasms., Disp: 30 tablet, Rfl: 0   diptheria-tetanus toxoids (DECAVAC) 2-2 LF/0.5ML injection, Inject 0.5 mLs into the muscle once for 1 dose., Disp: 0.5 mL, Rfl: 0   glucose blood (ACCU-CHEK GUIDE) test strip, CHECK BLOOD SUGAR 3 TIMES A DAY BEFORE BREAKFAST, LUNCH, AND DINNER, Disp: 150 strip, Rfl: 2   Icosapent Ethyl (VASCEPA) 1 g CAPS, One bid for elevated triglycerides, Disp: 180 capsule, Rfl: 0   Insulin Pen Needle (PEN NEEDLES) 32G X 4 MM MISC, Inject 1 each into the skin daily., Disp: 30 each, Rfl: 2   lisinopril (ZESTRIL) 20 MG tablet, TAKE 1 TABLET(20 MG) BY MOUTH DAILY, Disp: 90 tablet, Rfl: 1   Magnesium 250 MG TABS, Take 1 tablet (250 mg total) by mouth every evening. With evening meals, Disp: 30 tablet, Rfl: 2   modafinil (PROVIGIL) 200 MG tablet, TAKE 1 TABLET(200 MG) BY MOUTH DAILY, Disp: 30 tablet, Rfl: 5   Multiple Vitamins-Minerals (MULTIVITAMIN WITH MINERALS) tablet, Take 1 tablet by mouth daily.,  Disp: , Rfl:    niacin (NIASPAN) 500 MG CR tablet, Take 2,000 mg by mouth at bedtime. , Disp: , Rfl:    olmesartan-hydrochlorothiazide (BENICAR HCT) 40-25 MG tablet, TAKE 1 TABLET BY MOUTH DAILY, Disp: 90 tablet, Rfl: 1   Omega-3 Fatty Acids (FISH OIL) 1000 MG CPDR, Take by mouth. Take one tablet daily, Disp: , Rfl:    omeprazole (PRILOSEC) 10 MG capsule, Take 1 capsule (10 mg total) by mouth daily., Disp: 90 capsule, Rfl: 1   OVER THE COUNTER MEDICATION, Vitamin D 3 One capsule daily., Disp: , Rfl:    Semaglutide, 2 MG/DOSE, (OZEMPIC, 2 MG/DOSE,) 8 MG/3ML SOPN, Inject 2 mg into the skin once a week., Disp:  9 mL, Rfl: 1   tamsulosin (FLOMAX) 0.4 MG CAPS capsule, TAKE 1 CAPSULE BY MOUTH EVERY DAY 30 MINUTES AFTER THE SAME MEAL, Disp: 90 capsule, Rfl: 1   UNABLE TO FIND, CPAP for OSA (6cm-19cm), Disp: , Rfl:    Zoster Vaccine Adjuvanted (SHINGRIX) injection, Inject 0.5 mLs into the muscle once for 1 dose., Disp: 0.5 mL, Rfl: 0   fenofibrate (TRICOR) 145 MG tablet, TAKE 1 TABLET(145 MG) BY MOUTH DAILY, Disp: 90 tablet, Rfl: 1   No Known Allergies   Review of Systems  Constitutional: Negative.   Respiratory: Negative.  Negative for cough.   Cardiovascular:  Negative for chest pain, palpitations and leg swelling.  Endocrine: Negative for polydipsia, polyphagia and polyuria.  Musculoskeletal:  Positive for back pain.  Neurological:  Negative for dizziness and headaches.  Psychiatric/Behavioral: Negative.      Today's Vitals   04/17/21 0943  BP: (!) 110/50  Pulse: 96  Temp: 98.6 F (37 C)  Weight: 238 lb (108 kg)  Height: _0  (1.753 m)   Body mass index is 35.15 kg/m.  Wt Readings from Last 3 Encounters:  04/17/21 238 lb (108 kg)  04/17/21 239 lb 12.8 oz (108.8 kg)  01/20/21 238 lb 3.2 oz (108 kg)    Objective:  Physical Exam Vitals reviewed.  Constitutional:      General: He is not in acute distress.    Appearance: Normal appearance. He is obese.  HENT:     Head:  Normocephalic and atraumatic.  Eyes:     Pupils: Pupils are equal, round, and reactive to light.  Cardiovascular:     Rate and Rhythm: Normal rate and regular rhythm.     Pulses: Normal pulses.     Heart sounds: Normal heart sounds. No murmur heard. Pulmonary:     Effort: Pulmonary effort is normal. No respiratory distress.     Breath sounds: Normal breath sounds. No wheezing.  Skin:    General: Skin is warm.     Capillary Refill: Capillary refill takes less than 2 seconds.  Neurological:     General: No focal deficit present.     Mental Status: He is alert and oriented to person, place, and time.     Cranial Nerves: No cranial nerve deficit.  Psychiatric:        Mood and Affect: Mood normal.        Behavior: Behavior normal.        Thought Content: Thought content normal.        Judgment: Judgment normal.        Assessment And Plan:     1. Essential hypertension Comments: Good blood pressure control Continue current medications - CMP14+EGFR  2. Type 2 diabetes mellitus with diabetic neuropathy, without long-term current use of insulin (HCC) Blood sugars are still elevated, will increase Ozempic to 2 mg weekly Also encouraged to make healthier options for meals - Semaglutide, 2 MG/DOSE, (OZEMPIC, 2 MG/DOSE,) 8 MG/3ML SOPN; Inject 2 mg into the skin once a week.  Dispense: 9 mL; Refill: 1 - CMP14+EGFR - Hemoglobin A1c  3. Class 2 severe obesity due to excess calories with serious comorbidity and body mass index (BMI) of 35.0 to 35.9 in adult [facility]) She is encouraged to strive for BMI less than 30 to decrease cardiac risk. Advised to aim for at least 150 minutes of exercise per week.   4. Mixed hyperlipidemia Comments: May get 30 day supply of fenofibrate Encouraged to return call to Pharmacy team  to assist with fenofibrate - Lipid panel  5. OSA on CPAP Doing well with CPAP  6. Chronic bilateral low back pain without sciatica  Reports having L3-L5 disc compression, he  is not interested in steroid injections. Encouraged him to avoid steroid injections due to his diabetes and his blood sugars are not well controlled at this time. Encouraged to seek what is other options are for treatment  Patient was given opportunity to ask questions. Patient verbalized understanding of the plan and was able to repeat key elements of the plan. All questions were answered to their satisfaction.  [name], FNP   I, [name], FNP, have reviewed all documentation for this visit. The documentation on 04/17/21 for the exam, diagnosis, procedures, and orders are all accurate and complete.   IF YOU HAVE BEEN REFERRED TO A SPECIALIST, IT MAY TAKE 1-2 WEEKS TO SCHEDULE/PROCESS THE REFERRAL. IF YOU HAVE NOT HEARD FROM US/SPECIALIST IN TWO WEEKS, PLEASE GIVE [place] A CALL AT [phone].   THE PATIENT IS ENCOURAGED TO PRACTICE SOCIAL DISTANCING DUE TO THE COVID-19 PANDEMIC.

## 2021-04-17 NOTE — Patient Instructions (Signed)
Stephen Chase , Thank you for taking time to come for your Medicare Wellness Visit. I appreciate your ongoing commitment to your health goals. Please review the following plan we discussed and let me know if I can assist you in the future.   Screening recommendations/referrals: Colonoscopy: completed 02/03/2017, due 02/03/2022 Recommended yearly ophthalmology/optometry visit for glaucoma screening and checkup Recommended yearly dental visit for hygiene and checkup  Vaccinations: Influenza vaccine: completed 07/17/2020, due 04/21/2021 Pneumococcal vaccine: n/a Tdap vaccine: sent to pharmacy Shingles vaccine: sent to pharmacy   Covid-19:  08/06/2020, 12/28/2019  Advanced directives: copy in chart  Conditions/risks identified: none  Next appointment: Follow up in one year for your annual wellness visit   Preventive Care 40-64 Years, Male Preventive care refers to lifestyle choices and visits with your health care provider that can promote health and wellness. What does preventive care include? A yearly physical exam. This is also called an annual well check. Dental exams once or twice a year. Routine eye exams. Ask your health care provider how often you should have your eyes checked. Personal lifestyle choices, including: Daily care of your teeth and gums. Regular physical activity. Eating a healthy diet. Avoiding tobacco and drug use. Limiting alcohol use. Practicing safe sex. Taking low-dose aspirin every day starting at age 63. What happens during an annual well check? The services and screenings done by your health care provider during your annual well check will depend on your age, overall health, lifestyle risk factors, and family history of disease. Counseling  Your health care provider may ask you questions about your: Alcohol use. Tobacco use. Drug use. Emotional well-being. Home and relationship well-being. Sexual activity. Eating habits. Work and work  Statistician. Screening  You may have the following tests or measurements: Height, weight, and BMI. Blood pressure. Lipid and cholesterol levels. These may be checked every 5 years, or more frequently if you are over 49 years old. Skin check. Lung cancer screening. You may have this screening every year starting at age 6 if you have a 30-pack-year history of smoking and currently smoke or have quit within the past 15 years. Fecal occult blood test (FOBT) of the stool. You may have this test every year starting at age 25. Flexible sigmoidoscopy or colonoscopy. You may have a sigmoidoscopy every 5 years or a colonoscopy every 10 years starting at age 65. Prostate cancer screening. Recommendations will vary depending on your family history and other risks. Hepatitis C blood test. Hepatitis B blood test. Sexually transmitted disease (STD) testing. Diabetes screening. This is done by checking your blood sugar (glucose) after you have not eaten for a while (fasting). You may have this done every 1-3 years. Discuss your test results, treatment options, and if necessary, the need for more tests with your health care provider. Vaccines  Your health care provider may recommend certain vaccines, such as: Influenza vaccine. This is recommended every year. Tetanus, diphtheria, and acellular pertussis (Tdap, Td) vaccine. You may need a Td booster every 10 years. Zoster vaccine. You may need this after age 48. Pneumococcal 13-valent conjugate (PCV13) vaccine. You may need this if you have certain conditions and have not been vaccinated. Pneumococcal polysaccharide (PPSV23) vaccine. You may need one or two doses if you smoke cigarettes or if you have certain conditions. Talk to your health care provider about which screenings and vaccines you need and how often you need them. This information is not intended to replace advice given to you by your health care provider.  Make sure you discuss any questions you  have with your health care provider. Document Released: 10/04/2015 Document Revised: 05/27/2016 Document Reviewed: 07/09/2015 Elsevier Interactive Patient Education  2017 [employer] Prevention in the Home Falls can cause injuries. They can happen to people of all ages. There are many things you can do to make your home safe and to help prevent falls. What can I do on the outside of my home? Regularly fix the edges of walkways and driveways and fix any cracks. Remove anything that might make you trip as you walk through a door, such as a raised step or threshold. Trim any bushes or trees on the path to your home. Use bright outdoor lighting. Clear any walking paths of anything that might make someone trip, such as rocks or tools. Regularly check to see if handrails are loose or broken. Make sure that both sides of any steps have handrails. Any raised decks and porches should have guardrails on the edges. Have any leaves, snow, or ice cleared regularly. Use sand or salt on walking paths during winter. Clean up any spills in your garage right away. This includes oil or grease spills. What can I do in the bathroom? Use night lights. Install grab bars by the toilet and in the tub and shower. Do not use towel bars as grab bars. Use non-skid mats or decals in the tub or shower. If you need to sit down in the shower, use a plastic, non-slip stool. Keep the floor dry. Clean up any water that spills on the floor as soon as it happens. Remove soap buildup in the tub or shower regularly. Attach bath mats securely with double-sided non-slip rug tape. Do not have throw rugs and other things on the floor that can make you trip. What can I do in the bedroom? Use night lights. Make sure that you have a light by your bed that is easy to reach. Do not use any sheets or blankets that are too big for your bed. They should not hang down onto the floor. Have a firm chair that has side arms. You can  use this for support while you get dressed. Do not have throw rugs and other things on the floor that can make you trip. What can I do in the kitchen? Clean up any spills right away. Avoid walking on wet floors. Keep items that you use a lot in easy-to-reach places. If you need to reach something above you, use a strong step stool that has a grab bar. Keep electrical cords out of the way. Do not use floor polish or wax that makes floors slippery. If you must use wax, use non-skid floor wax. Do not have throw rugs and other things on the floor that can make you trip. What can I do with my stairs? Do not leave any items on the stairs. Make sure that there are handrails on both sides of the stairs and use them. Fix handrails that are broken or loose. Make sure that handrails are as long as the stairways. Check any carpeting to make sure that it is firmly attached to the stairs. Fix any carpet that is loose or worn. Avoid having throw rugs at the top or bottom of the stairs. If you do have throw rugs, attach them to the floor with carpet tape. Make sure that you have a light switch at the top of the stairs and the bottom of the stairs. If you do not have them,  ask someone to add them for you. What else can I do to help prevent falls? Wear shoes that: Do not have high heels. Have rubber bottoms. Are comfortable and fit you well. Are closed at the toe. Do not wear sandals. If you use a stepladder: Make sure that it is fully opened. Do not climb a closed stepladder. Make sure that both sides of the stepladder are locked into place. Ask someone to hold it for you, if possible. Clearly mark and make sure that you can see: Any grab bars or handrails. First and last steps. Where the edge of each step is. Use tools that help you move around (mobility aids) if they are needed. These include: Canes. Walkers. Scooters. Crutches. Turn on the lights when you go into a dark area. Replace any light  bulbs as soon as they burn out. Set up your furniture so you have a clear path. Avoid moving your furniture around. If any of your floors are uneven, fix them. If there are any pets around you, be aware of where they are. Review your medicines with your doctor. Some medicines can make you feel dizzy. This can increase your chance of falling. Ask your doctor what other things that you can do to help prevent falls. This information is not intended to replace advice given to you by your health care provider. Make sure you discuss any questions you have with your health care provider. Document Released: 07/04/2009 Document Revised: 02/13/2016 Document Reviewed: 10/12/2014 Elsevier Interactive Patient Education  2017 [employer].

## 2021-04-18 LAB — CMP14+EGFR
ALT: 59 IU/L — ABNORMAL HIGH (ref 0–44)
AST: 46 IU/L — ABNORMAL HIGH (ref 0–40)
Albumin/Globulin Ratio: 1.8 (ref 1.2–2.2)
Albumin: 4.5 g/dL (ref 3.8–4.9)
Alkaline Phosphatase: 124 IU/L — ABNORMAL HIGH (ref 44–121)
BUN/Creatinine Ratio: 20 (ref 9–20)
BUN: 33 mg/dL — ABNORMAL HIGH (ref 6–24)
Bilirubin Total: 0.9 mg/dL (ref 0.0–1.2)
CO2: 18 mmol/L — ABNORMAL LOW (ref 20–29)
Calcium: 9.7 mg/dL (ref 8.7–10.2)
Chloride: 99 mmol/L (ref 96–106)
Creatinine, Ser: 1.64 mg/dL — ABNORMAL HIGH (ref 0.76–1.27)
Globulin, Total: 2.5 g/dL (ref 1.5–4.5)
Glucose: 204 mg/dL — ABNORMAL HIGH (ref 65–99)
Potassium: 4.8 mmol/L (ref 3.5–5.2)
Sodium: 134 mmol/L (ref 134–144)
Total Protein: 7 g/dL (ref 6.0–8.5)
eGFR: 49 mL/min/{1.73_m2} — ABNORMAL LOW (ref >59)

## 2021-04-18 LAB — HEMOGLOBIN A1C
Est. average glucose Bld gHb Est-mCnc: 194 mg/dL
Hgb A1c MFr Bld: 8.4 % — ABNORMAL HIGH (ref 4.8–5.6)

## 2021-04-18 LAB — LIPID PANEL
Chol/HDL Ratio: 6.2 ratio — ABNORMAL HIGH (ref 0.0–5.0)
Cholesterol, Total: 156 mg/dL (ref 100–199)
HDL: 25 mg/dL — ABNORMAL LOW (ref >39)
LDL Chol Calc (NIH): 55 mg/dL (ref 0–99)
Triglycerides: 504 mg/dL — ABNORMAL HIGH (ref 0–149)
VLDL Cholesterol Cal: 76 mg/dL — ABNORMAL HIGH (ref 5–40)

## 2021-04-22 ENCOUNTER — Ambulatory Visit: Payer: Medicare PPO | Admitting: Nurse Practitioner

## 2021-04-28 ENCOUNTER — Telehealth: Payer: Self-pay

## 2021-04-28 NOTE — Telephone Encounter (Signed)
Called pt leaving message. Pt had a question about Niacin supplement, provider stated that he needs to continue to take medication for his elevated triglycerides. Follow up with provider will be in sept with his physical.

## 2021-05-01 ENCOUNTER — Other Ambulatory Visit: Payer: Self-pay | Admitting: Neurology

## 2021-06-06 ENCOUNTER — Encounter: Payer: Self-pay | Admitting: Nurse Practitioner

## 2021-06-09 ENCOUNTER — Encounter: Payer: Medicare PPO | Admitting: Nurse Practitioner

## 2021-07-01 ENCOUNTER — Telehealth: Payer: Self-pay

## 2021-07-01 NOTE — Chronic Care Management (AMB) (Signed)
Chronic Care Management Pharmacy Assistant   Name: Stephen Chase  MRN: [id number] DOB: 1965-11-11   Reason for Encounter: Disease State/ Diabetes   Recent office visits:  04-17-2021 Kellie Simmering, LPN. Medicare wellness visit. Shingrix and tetanus given.  04-17-2021 [name], [place]. Glucose= 204, BUN= 33, Creatinine= 1.64, eGFR= 49, CO2= 18, Alkaline= 124, AST= 46, ALT= 59. A1C= 8.4. Trig= 504, HDL= 25, VLDL= 76, Chol/HDL= 6.2. INCREASE Ozempic 1 mg weekly TO 2 mg weekly.  Recent consult visits:  None  Hospital visits:  None in previous 6 months  Medications: Outpatient Encounter Medications as of 07/01/2021  Medication Sig   Accu-Chek Softclix Lancets lancets Use as instructed   aspirin EC 81 MG tablet Take 81 mg by mouth daily.   atorvastatin (LIPITOR) 40 MG tablet TAKE 1 TABLET(40 MG) BY MOUTH AT BEDTIME   Blood Glucose Monitoring Suppl (ACCU-CHEK GUIDE) w/Device KIT Use to check blood sugars three times daily E11.9   carvedilol (COREG) 6.25 MG tablet TAKE 1 TABLET(6.25 MG) BY MOUTH TWICE DAILY WITH A MEAL   cholecalciferol (VITAMIN D3) 25 MCG (1000 UNIT) tablet Take 1,000 Units by mouth daily.   Cyanocobalamin (B-12 PO) Take by mouth daily.   cyclobenzaprine (FLEXERIL) 10 MG tablet Take 1 tablet (10 mg total) by mouth 3 (three) times daily as needed for muscle spasms.   fenofibrate (TRICOR) 145 MG tablet TAKE 1 TABLET(145 MG) BY MOUTH DAILY   glucose blood (ACCU-CHEK GUIDE) test strip CHECK BLOOD SUGAR 3 TIMES A DAY BEFORE BREAKFAST, LUNCH, AND DINNER   Icosapent Ethyl (VASCEPA) 1 g CAPS One bid for elevated triglycerides   Insulin Pen Needle (PEN NEEDLES) 32G X 4 MM MISC Inject 1 each into the skin daily.   lisinopril (ZESTRIL) 20 MG tablet TAKE 1 TABLET(20 MG) BY MOUTH DAILY   Magnesium 250 MG TABS Take 1 tablet (250 mg total) by mouth every evening. With evening meals   modafinil (PROVIGIL) 200 MG tablet TAKE 1 TABLET(200 MG) BY MOUTH DAILY   Multiple  Vitamins-Minerals (MULTIVITAMIN WITH MINERALS) tablet Take 1 tablet by mouth daily.   niacin (NIASPAN) 500 MG CR tablet Take 2,000 mg by mouth at bedtime.    olmesartan-hydrochlorothiazide (BENICAR HCT) 40-25 MG tablet TAKE 1 TABLET BY MOUTH DAILY   Omega-3 Fatty Acids (FISH OIL) 1000 MG CPDR Take by mouth. Take one tablet daily   omeprazole (PRILOSEC) 10 MG capsule Take 1 capsule (10 mg total) by mouth daily.   OVER THE COUNTER MEDICATION Vitamin D 3 One capsule daily.   Semaglutide, 2 MG/DOSE, (OZEMPIC, 2 MG/DOSE,) 8 MG/3ML SOPN Inject 2 mg into the skin once a week.   tamsulosin (FLOMAX) 0.4 MG CAPS capsule TAKE 1 CAPSULE BY MOUTH EVERY DAY 30 MINUTES AFTER THE SAME MEAL   UNABLE TO FIND CPAP for OSA (6cm-19cm)   No facility-administered encounter medications on file as of 07/01/2021.  Recent Relevant Labs: Lab Results  Component Value Date/Time   HGBA1C 8.4 (H) 04/17/2021 10:25 AM   HGBA1C 8.0 (H) 01/20/2021 10:25 AM   MICROALBUR 10 04/17/2021 11:13 AM   MICROALBUR 80 04/10/2020 11:49 AM    Kidney Function Lab Results  Component Value Date/Time   CREATININE 1.64 (H) 04/17/2021 10:25 AM   CREATININE 1.13 01/20/2021 10:25 AM   GFRNONAA 72 10/17/2020 02:37 PM   GFRAA 84 10/17/2020 02:37 PM      07-01-2021: 1st attempt left VM on both lines 07-07-2021: 2nd attempt left VM on both lines and sent text  message 07-08-2021: 3rd attempt patient's wife stated he wasn't around  Care Gaps: Covid booster overdue last completed 08-06-2020 Flu vaccine last completed 07-17-2020 AWV 04-17-2021 A1C 04-17-2021 8.4  Star Rating Drugs: Ozempic 2 mg- Patient assistance Olmesartan/HCTZ 40-25 mg- Last filled 04-02-2021 90 DS [facility] Atorvastatin 40 mg- Last filled 02-28-2021 90 DS [facility] Lisinopril 20 mg- Last filled 04-01-2021 90 DS [place] Clinical Pharmacist Assistant [phone]

## 2021-07-30 ENCOUNTER — Encounter: Payer: Medicare PPO | Admitting: Nurse Practitioner

## 2021-08-02 ENCOUNTER — Encounter: Payer: Self-pay | Admitting: Nurse Practitioner

## 2021-08-04 ENCOUNTER — Other Ambulatory Visit: Payer: Self-pay

## 2021-08-04 DIAGNOSIS — E1140 Type 2 diabetes mellitus with diabetic neuropathy, unspecified: Secondary | ICD-10-CM

## 2021-08-04 MED ORDER — OZEMPIC (2 MG/DOSE) 8 MG/3ML ~~LOC~~ SOPN: 2.0000 mg | PEN_INJECTOR | SUBCUTANEOUS | 1 refills | Status: DC

## 2021-08-05 DIAGNOSIS — H524 Presbyopia: Secondary | ICD-10-CM | POA: Diagnosis not present

## 2021-08-05 DIAGNOSIS — E119 Type 2 diabetes mellitus without complications: Secondary | ICD-10-CM | POA: Diagnosis not present

## 2021-08-05 LAB — HM DIABETES EYE EXAM

## 2021-08-06 ENCOUNTER — Encounter: Payer: Self-pay | Admitting: Internal Medicine

## 2021-08-07 ENCOUNTER — Telehealth: Payer: Self-pay

## 2021-08-07 ENCOUNTER — Ambulatory Visit (HOSPITAL_COMMUNITY)
Admission: EM | Admit: 2021-08-07 | Discharge: 2021-08-07 | Disposition: A | Discharge status: Home / Self Care | Payer: Medicare PPO | Attending: Urgent Care | Admitting: Urgent Care

## 2021-08-07 ENCOUNTER — Other Ambulatory Visit: Payer: Self-pay

## 2021-08-07 ENCOUNTER — Ambulatory Visit (INDEPENDENT_AMBULATORY_CARE_PROVIDER_SITE_OTHER): Payer: Medicare PPO

## 2021-08-07 ENCOUNTER — Encounter (HOSPITAL_COMMUNITY): Payer: Self-pay | Admitting: Emergency Medicine

## 2021-08-07 DIAGNOSIS — E118 Type 2 diabetes mellitus with unspecified complications: Secondary | ICD-10-CM | POA: Diagnosis not present

## 2021-08-07 DIAGNOSIS — W19XXXA Unspecified fall, initial encounter: Secondary | ICD-10-CM

## 2021-08-07 DIAGNOSIS — M5136 Other intervertebral disc degeneration, lumbar region: Secondary | ICD-10-CM

## 2021-08-07 DIAGNOSIS — M545 Low back pain, unspecified: Secondary | ICD-10-CM

## 2021-08-07 DIAGNOSIS — S20221A Contusion of right back wall of thorax, initial encounter: Secondary | ICD-10-CM

## 2021-08-07 DIAGNOSIS — S20411A Abrasion of right back wall of thorax, initial encounter: Secondary | ICD-10-CM | POA: Diagnosis not present

## 2021-08-07 DIAGNOSIS — Z794 Long term (current) use of insulin: Secondary | ICD-10-CM

## 2021-08-07 MED ORDER — NAPROXEN 375 MG PO TABS: 375.0000 mg | ORAL_TABLET | Freq: Two times a day (BID) | ORAL | 0 refills | Status: DC

## 2021-08-07 MED ORDER — TIZANIDINE HCL 4 MG PO TABS: 4.0000 mg | ORAL_TABLET | Freq: Every day | ORAL | 0 refills | Status: DC

## 2021-08-07 NOTE — ED Triage Notes (Signed)
Tuesday evening when was raining took dog out to potty and slipped on steps and hit lower back really hard. Was able to get self up and thought pain would get better but hasnt. Reports that left side has gone down but right side still feels swollen. Pt took BC powder for pains.

## 2021-08-07 NOTE — Progress Notes (Addendum)
    Chronic Care Management Pharmacy Assistant   Name: Maclain Cohron  MRN: [id number] DOB: 1965/10/27    Reason for Encounter: 2023 PAP    Medications: Outpatient Encounter Medications as of 08/07/2021  Medication Sig   Accu-Chek Softclix Lancets lancets Use as instructed   aspirin EC 81 MG tablet Take 81 mg by mouth daily.   atorvastatin (LIPITOR) 40 MG tablet TAKE 1 TABLET(40 MG) BY MOUTH AT BEDTIME   Blood Glucose Monitoring Suppl (ACCU-CHEK GUIDE) w/Device KIT Use to check blood sugars three times daily E11.9   carvedilol (COREG) 6.25 MG tablet TAKE 1 TABLET(6.25 MG) BY MOUTH TWICE DAILY WITH A MEAL   cholecalciferol (VITAMIN D3) 25 MCG (1000 UNIT) tablet Take 1,000 Units by mouth daily.   Cyanocobalamin (B-12 PO) Take by mouth daily.   cyclobenzaprine (FLEXERIL) 10 MG tablet Take 1 tablet (10 mg total) by mouth 3 (three) times daily as needed for muscle spasms.   fenofibrate (TRICOR) 145 MG tablet TAKE 1 TABLET(145 MG) BY MOUTH DAILY   glucose blood (ACCU-CHEK GUIDE) test strip CHECK BLOOD SUGAR 3 TIMES A DAY BEFORE BREAKFAST, LUNCH, AND DINNER   Icosapent Ethyl (VASCEPA) 1 g CAPS One bid for elevated triglycerides   Insulin Pen Needle (PEN NEEDLES) 32G X 4 MM MISC Inject 1 each into the skin daily.   lisinopril (ZESTRIL) 20 MG tablet TAKE 1 TABLET(20 MG) BY MOUTH DAILY   Magnesium 250 MG TABS Take 1 tablet (250 mg total) by mouth every evening. With evening meals   modafinil (PROVIGIL) 200 MG tablet TAKE 1 TABLET(200 MG) BY MOUTH DAILY   Multiple Vitamins-Minerals (MULTIVITAMIN WITH MINERALS) tablet Take 1 tablet by mouth daily.   niacin (NIASPAN) 500 MG CR tablet Take 2,000 mg by mouth at bedtime.    olmesartan-hydrochlorothiazide (BENICAR HCT) 40-25 MG tablet TAKE 1 TABLET BY MOUTH DAILY   Omega-3 Fatty Acids (FISH OIL) 1000 MG CPDR Take by mouth. Take one tablet daily   omeprazole (PRILOSEC) 10 MG capsule Take 1 capsule (10 mg total) by mouth daily.   OVER THE COUNTER  MEDICATION Vitamin D 3 One capsule daily.   Semaglutide, 2 MG/DOSE, (OZEMPIC, 2 MG/DOSE,) 8 MG/3ML SOPN Inject 2 mg into the skin once a week.   tamsulosin (FLOMAX) 0.4 MG CAPS capsule TAKE 1 CAPSULE BY MOUTH EVERY DAY 30 MINUTES AFTER THE SAME MEAL   UNABLE TO FIND CPAP for OSA (6cm-19cm)   No facility-administered encounter medications on file as of 08/07/2021.   08-07-2021: Initiated 2023 patient assistance for Ozempic. Mailed application off.   [place] Pharmacist Assistant [phone]

## 2021-08-07 NOTE — ED Provider Notes (Signed)
[place]   MRN: [id number] DOB: 07/14/1966  Subjective:   Stephen Chase is a 55 y.o. male presenting for suffering a fall 2 days ago.  Patient slipped on steps and landed on his low back making impact against the lower-mid back on right side.  Has had swelling.  Wants to make sure that he does not have a fracture.  Does admit that its been more difficult for him to defecate and also sleep, lay down flat at night.  Has a history of type 2 diabetes, last A1c was about 8.4% 4 months ago.  No history of musculoskeletal disorders. No history of CKD.  He did have a slight increase in his creatinine level and liver enzymes but this is being monitored.  No current facility-administered medications for this encounter.  Current Outpatient Medications:    Accu-Chek Softclix Lancets lancets, Use as instructed, Disp: 200 each, Rfl: 3   aspirin EC 81 MG tablet, Take 81 mg by mouth daily., Disp: , Rfl:    atorvastatin (LIPITOR) 40 MG tablet, TAKE 1 TABLET(40 MG) BY MOUTH AT BEDTIME, Disp: 90 tablet, Rfl: 1   Blood Glucose Monitoring Suppl (ACCU-CHEK GUIDE) w/Device KIT, Use to check blood sugars three times daily E11.9, Disp: 1 kit, Rfl: 0   carvedilol (COREG) 6.25 MG tablet, TAKE 1 TABLET(6.25 MG) BY MOUTH TWICE DAILY WITH A MEAL, Disp: 180 tablet, Rfl: 1   cholecalciferol (VITAMIN D3) 25 MCG (1000 UNIT) tablet, Take 1,000 Units by mouth daily., Disp: , Rfl:    Cyanocobalamin (B-12 PO), Take by mouth daily., Disp: , Rfl:    cyclobenzaprine (FLEXERIL) 10 MG tablet, Take 1 tablet (10 mg total) by mouth 3 (three) times daily as needed for muscle spasms., Disp: 30 tablet, Rfl: 0   fenofibrate (TRICOR) 145 MG tablet, TAKE 1 TABLET(145 MG) BY MOUTH DAILY, Disp: 90 tablet, Rfl: 1   glucose blood (ACCU-CHEK GUIDE) test strip, CHECK BLOOD SUGAR 3 TIMES A DAY BEFORE BREAKFAST, LUNCH, AND DINNER, Disp: 150 strip, Rfl: 2   Icosapent Ethyl (VASCEPA) 1 g CAPS, One bid for elevated triglycerides, Disp:  180 capsule, Rfl: 0   Insulin Pen Needle (PEN NEEDLES) 32G X 4 MM MISC, Inject 1 each into the skin daily., Disp: 30 each, Rfl: 2   lisinopril (ZESTRIL) 20 MG tablet, TAKE 1 TABLET(20 MG) BY MOUTH DAILY, Disp: 90 tablet, Rfl: 1   Magnesium 250 MG TABS, Take 1 tablet (250 mg total) by mouth every evening. With evening meals, Disp: 30 tablet, Rfl: 2   modafinil (PROVIGIL) 200 MG tablet, TAKE 1 TABLET(200 MG) BY MOUTH DAILY, Disp: 30 tablet, Rfl: 5   Multiple Vitamins-Minerals (MULTIVITAMIN WITH MINERALS) tablet, Take 1 tablet by mouth daily., Disp: , Rfl:    niacin (NIASPAN) 500 MG CR tablet, Take 2,000 mg by mouth at bedtime. , Disp: , Rfl:    olmesartan-hydrochlorothiazide (BENICAR HCT) 40-25 MG tablet, TAKE 1 TABLET BY MOUTH DAILY, Disp: 90 tablet, Rfl: 1   Omega-3 Fatty Acids (FISH OIL) 1000 MG CPDR, Take by mouth. Take one tablet daily, Disp: , Rfl:    omeprazole (PRILOSEC) 10 MG capsule, Take 1 capsule (10 mg total) by mouth daily., Disp: 90 capsule, Rfl: 1   OVER THE COUNTER MEDICATION, Vitamin D 3 One capsule daily., Disp: , Rfl:    Semaglutide, 2 MG/DOSE, (OZEMPIC, 2 MG/DOSE,) 8 MG/3ML SOPN, Inject 2 mg into the skin once a week., Disp: 9 mL, Rfl: 1   tamsulosin (FLOMAX) 0.4 MG  CAPS capsule, TAKE 1 CAPSULE BY MOUTH EVERY DAY 30 MINUTES AFTER THE SAME MEAL, Disp: 90 capsule, Rfl: 1   UNABLE TO FIND, CPAP for OSA (6cm-19cm), Disp: , Rfl:    No Known Allergies  Past Medical History:  Diagnosis Date   Allergy    Arthritis    SHOULDERS   Balanitis    Deafness, sensorineural, childhood onset    BILATERAL SINCE AGE 2  SECONDARY TO UNKNOWN ILLNESS   GERD (gastroesophageal reflux disease)    Hyperlipidemia    Hypertension    No blood products    Patient is Jehovah Witness no blood products.   OSA on CPAP    SEVERE PER STUDY 03-09-2009   Sleep apnea    uses a cpap   Smokers' cough ([place])    Type 2 diabetes mellitus ([place])      Past Surgical History:  Procedure Laterality Date    CIRCUMCISION N/A 12/19/2013   Procedure: CIRCUMCISION ADULT;  Surgeon: Lowella Bandy, MD;  Location[facility];  Service: Urology;  Laterality: N/A;   SHOULDER SURGERY Right    WRIST SURGERY Left     Family History  Problem Relation Age of Onset   Cancer Mother        type unknown pt was 47 years old at that time   Liver disease Father    Healthy Brother    Cancer Other         alot of family members have cancer but doesnt know the type   Colon cancer Neg Hx    Esophageal cancer Neg Hx    Rectal cancer Neg Hx    Stomach cancer Neg Hx     Social History   Tobacco Use   Smoking status: Former    Packs/day: 1.00    Years: 30.00    Pack years: 30.00    Types: Cigarettes   Smokeless tobacco: Never   Tobacco comments:    quit 4 years ago  Vaping Use   Vaping Use: Never used  Substance Use Topics   Alcohol use: Not Currently    Comment: rarely   Drug use: No    ROS   Objective:   Vitals: BP 124/79 (BP Location: Right Arm)   Pulse 81   Temp 98.7 F (37.1 C) (Oral)   Resp 19   SpO2 95%   Physical Exam Constitutional:      General: He is not in acute distress.    Appearance: Normal appearance. He is well-developed and normal weight. He is not ill-appearing, toxic-appearing or diaphoretic.  HENT:     Head: Normocephalic and atraumatic.     Right Ear: External ear normal.     Left Ear: External ear normal.     Nose: Nose normal.     Mouth/Throat:     Pharynx: Oropharynx is clear.  Eyes:     General: No scleral icterus.       Right eye: No discharge.        Left eye: No discharge.     Extraocular Movements: Extraocular movements intact.     Pupils: Pupils are equal, round, and reactive to light.  Cardiovascular:     Rate and Rhythm: Normal rate.  Pulmonary:     Effort: Pulmonary effort is normal.  Musculoskeletal:     Cervical back: Normal range of motion.     Lumbar back: Swelling (1+ over area outlined), spasms, tenderness (Over area  outlined with associated abrasion) and bony tenderness present.  No edema, deformity, signs of trauma or lacerations. Decreased range of motion. Negative right straight leg raise test and negative left straight leg raise test. No scoliosis.       Back:  Neurological:     Mental Status: He is alert and oriented to person, place, and time.     Motor: No weakness.     Coordination: Coordination normal.     Gait: Gait normal.     Deep Tendon Reflexes: Reflexes normal.  Psychiatric:        Mood and Affect: Mood normal.        Behavior: Behavior normal.        Thought Content: Thought content normal.        Judgment: Judgment normal.    DG Lumbar Spine Complete  Result Date: 08/07/2021 CLINICAL DATA:  low back pain, fall EXAM: LUMBAR SPINE - COMPLETE 4+ VIEW COMPARISON:  None. FINDINGS: There are 5 non-rib-bearing lumbar vertebrae. There is mild multilevel degenerative disc disease and lower lumbar predominant facet arthropathy. No significant listhesis. No evidence of lumbar spine fracture. IMPRESSION: Mild multilevel degenerative disc disease and lower lumbar predominant facet arthropathy. Electronically Signed   By: Maurine Simmering M.D.   On: 08/07/2021 14:06     Assessment and Plan :   PDMP not reviewed this encounter.  1. Back contusion, right, initial encounter   2. Acute bilateral low back pain without sciatica   3. Fall, initial encounter   4. Abrasion of right side of back, initial encounter   5. Degenerative disc disease, lumbar   6. Type 2 diabetes mellitus treated without insulin ([place])     Recommended conservative management for contusion of his back. Will have him use naproxen, tizanidine. Counseled patient on potential for adverse effects with medications prescribed/recommended today, ER and return-to-clinic precautions discussed, patient verbalized understanding.    Jaynee Eagles, PA-C 08/07/21 1420

## 2021-08-07 NOTE — ED Triage Notes (Signed)
Reports since fall urination is normal but hasnt had any BMs since Monday. Normally have BM once to twice a day. Tried drinking shakes to help facilitate BM with no luck.  Pt also c/o left shoulder pains as well. Hx bone spurs on left shoulder that was supposed to have surgery on but he declined which pains had subsided until fall.

## 2021-08-11 ENCOUNTER — Ambulatory Visit: Payer: Medicare PPO | Admitting: Nurse Practitioner

## 2021-08-11 ENCOUNTER — Encounter: Payer: Self-pay | Admitting: Internal Medicine

## 2021-08-11 ENCOUNTER — Other Ambulatory Visit: Payer: Self-pay

## 2021-08-11 ENCOUNTER — Encounter: Payer: Self-pay | Admitting: Nurse Practitioner

## 2021-08-11 VITALS — BP 116/64 | HR 68 | Temp 97.7°F | Ht 69.0 in | Wt 240.0 lb

## 2021-08-11 DIAGNOSIS — I10 Essential (primary) hypertension: Secondary | ICD-10-CM

## 2021-08-11 DIAGNOSIS — E1140 Type 2 diabetes mellitus with diabetic neuropathy, unspecified: Secondary | ICD-10-CM | POA: Diagnosis not present

## 2021-08-11 DIAGNOSIS — Z6835 Body mass index (BMI) 35.0-35.9, adult: Secondary | ICD-10-CM

## 2021-08-11 DIAGNOSIS — Z23 Encounter for immunization: Secondary | ICD-10-CM | POA: Diagnosis not present

## 2021-08-11 DIAGNOSIS — M25512 Pain in left shoulder: Secondary | ICD-10-CM

## 2021-08-11 DIAGNOSIS — E782 Mixed hyperlipidemia: Secondary | ICD-10-CM

## 2021-08-11 MED ORDER — SHINGRIX 50 MCG/0.5ML IM SUSR: 0.5000 mL | Freq: Once | INTRAMUSCULAR | 0 refills | Status: AC

## 2021-08-11 MED ORDER — TETANUS-DIPHTH-ACELL PERTUSSIS 5-2.5-18.5 LF-MCG/0.5 IM SUSP: 0.5000 mL | Freq: Once | INTRAMUSCULAR | 0 refills | Status: AC

## 2021-08-11 NOTE — Progress Notes (Signed)
I,Yamilka J Llittleton,acting as a Education administrator for [employer], FNP.,have documented all relevant documentation on the behalf of [name], FNP,as directed by  [name], FNP while in the presence of [name], [place].   This visit occurred during the SARS-CoV-2 public health emergency.  Safety protocols were in place, including screening questions prior to the visit, additional usage of staff PPE, and extensive cleaning of exam room while observing appropriate contact time as indicated for disinfecting solutions.  Subjective:     Patient ID: Stephen Chase , male    DOB: 1966-07-17 , 55 y.o.   MRN: [id number]   Chief Complaint  Patient presents with   Hypertension   Diabetes   Fall    Patient stated he went to the urgent care for his back he had a bad fall they did xrays and prescribed him some meds and they are working okay for him    HPI  Patient presents today for a diabetes and blood pressure check. He stated he went to the urgent care for a fall. They did xrays on him and gave him some meds. The meds are working well for him.  He had back pain with a large bruise to his back . He is sleeping in a recliner due to having pain when laying flat.   Has a history of cleaning a bone spur to right shoulder feels the left may be the same thing. Would like to go to [place] for evaluation.   He is here today with a deaf interpreter  Wt Readings from Last 3 Encounters: 08/11/21 : 240 lb (108.9 kg) 04/17/21 : 238 lb (108 kg) 04/17/21 : 239 lb 12.8 oz (108.8 kg)    Hypertension This is a chronic problem. The current episode started more than 1 year ago. The problem has been gradually improving since onset. The problem is controlled. Pertinent negatives include no chest pain, headaches or palpitations. There are no associated agents to hypertension. Risk factors for coronary artery disease include obesity, sedentary lifestyle, dyslipidemia, diabetes mellitus and male gender. Past  treatments include ACE inhibitors. There are no compliance problems.  There is no history of chronic renal disease.  Diabetes He presents for his follow-up diabetic visit. He has type 2 diabetes mellitus. His disease course has been stable (not much improved). Pertinent negatives for hypoglycemia include no dizziness or headaches. Pertinent negatives for diabetes include no chest pain, no polydipsia, no polyphagia and no polyuria. There are no hypoglycemic complications. Symptoms are stable. There are no diabetic complications. Risk factors for coronary artery disease include obesity and sedentary lifestyle. Current diabetic treatment includes oral agent (dual therapy). His weight is stable. He is following a generally unhealthy diet. When asked about meal planning, he reported none. He has not had a previous visit with a dietitian. He participates in exercise intermittently (he is walking alot and working a strenuous job.). There is no change in his home blood glucose trend. (Reports his average blood sugar has been 179 in the morning 190-200 then goes down by the evening to 120-130.  ) An ACE inhibitor/angiotensin II receptor blocker is being taken. Eye exam is current.  Fall The accident occurred 5 to 7 days ago. The fall occurred while walking (While walking the dog in the rain and missed a step with the wrong shoes). He landed on [place]. There was no blood loss. The pain is present in the back. Pertinent negatives include no abdominal pain or headaches.    Past Medical  History:  Diagnosis Date   Allergy    Arthritis    SHOULDERS   Balanitis    Deafness, sensorineural, childhood onset    BILATERAL SINCE AGE 56  SECONDARY TO UNKNOWN ILLNESS   GERD (gastroesophageal reflux disease)    Hyperlipidemia    Hypertension    No blood products    Patient is Jehovah Witness no blood products.   OSA on CPAP    SEVERE PER STUDY 03-09-2009   Sleep apnea    uses a cpap   Smokers' cough ([place])    Type 2  diabetes mellitus ([place])      Family History  Problem Relation Age of Onset   Cancer Mother        type unknown pt was 33 years old at that time   Liver disease Father    Healthy Brother    Cancer Other         alot of family members have cancer but doesnt know the type   Colon cancer Neg Hx    Esophageal cancer Neg Hx    Rectal cancer Neg Hx    Stomach cancer Neg Hx      Current Outpatient Medications:    Accu-Chek Softclix Lancets lancets, Use as instructed, Disp: 200 each, Rfl: 3   aspirin EC 81 MG tablet, Take 81 mg by mouth daily., Disp: , Rfl:    atorvastatin (LIPITOR) 40 MG tablet, TAKE 1 TABLET(40 MG) BY MOUTH AT BEDTIME, Disp: 90 tablet, Rfl: 1   Blood Glucose Monitoring Suppl (ACCU-CHEK GUIDE) w/Device KIT, Use to check blood sugars three times daily E11.9, Disp: 1 kit, Rfl: 0   carvedilol (COREG) 6.25 MG tablet, TAKE 1 TABLET(6.25 MG) BY MOUTH TWICE DAILY WITH A MEAL, Disp: 180 tablet, Rfl: 1   cholecalciferol (VITAMIN D3) 25 MCG (1000 UNIT) tablet, Take 1,000 Units by mouth daily., Disp: , Rfl:    Cyanocobalamin (B-12 PO), Take by mouth daily., Disp: , Rfl:    cyclobenzaprine (FLEXERIL) 10 MG tablet, Take 1 tablet (10 mg total) by mouth 3 (three) times daily as needed for muscle spasms., Disp: 30 tablet, Rfl: 0   fenofibrate (TRICOR) 145 MG tablet, TAKE 1 TABLET(145 MG) BY MOUTH DAILY, Disp: 90 tablet, Rfl: 1   glucose blood (ACCU-CHEK GUIDE) test strip, CHECK BLOOD SUGAR 3 TIMES A DAY BEFORE BREAKFAST, LUNCH, AND DINNER, Disp: 150 strip, Rfl: 2   Icosapent Ethyl (VASCEPA) 1 g CAPS, One bid for elevated triglycerides, Disp: 180 capsule, Rfl: 0   Insulin Pen Needle (PEN NEEDLES) 32G X 4 MM MISC, Inject 1 each into the skin daily., Disp: 30 each, Rfl: 2   lisinopril (ZESTRIL) 20 MG tablet, TAKE 1 TABLET(20 MG) BY MOUTH DAILY, Disp: 90 tablet, Rfl: 1   Magnesium 250 MG TABS, Take 1 tablet (250 mg total) by mouth every evening. With evening meals, Disp: 30 tablet, Rfl: 2    modafinil (PROVIGIL) 200 MG tablet, TAKE 1 TABLET(200 MG) BY MOUTH DAILY, Disp: 30 tablet, Rfl: 5   Multiple Vitamins-Minerals (MULTIVITAMIN WITH MINERALS) tablet, Take 1 tablet by mouth daily., Disp: , Rfl:    naproxen (NAPROSYN) 375 MG tablet, Take 1 tablet (375 mg total) by mouth 2 (two) times daily with a meal., Disp: 30 tablet, Rfl: 0   niacin (NIASPAN) 500 MG CR tablet, Take 2,000 mg by mouth at bedtime. , Disp: , Rfl:    olmesartan-hydrochlorothiazide (BENICAR HCT) 40-25 MG tablet, TAKE 1 TABLET BY MOUTH DAILY, Disp: 90 tablet, Rfl: 1  Omega-3 Fatty Acids (FISH OIL) 1000 MG CPDR, Take by mouth. Take one tablet daily, Disp: , Rfl:    omeprazole (PRILOSEC) 10 MG capsule, Take 1 capsule (10 mg total) by mouth daily., Disp: 90 capsule, Rfl: 1   OVER THE COUNTER MEDICATION, Vitamin D 3 One capsule daily., Disp: , Rfl:    Semaglutide, 2 MG/DOSE, (OZEMPIC, 2 MG/DOSE,) 8 MG/3ML SOPN, Inject 2 mg into the skin once a week., Disp: 9 mL, Rfl: 1   tamsulosin (FLOMAX) 0.4 MG CAPS capsule, TAKE 1 CAPSULE BY MOUTH EVERY DAY 30 MINUTES AFTER THE SAME MEAL, Disp: 90 capsule, Rfl: 1   Tdap (BOOSTRIX) 5-2.5-18.5 LF-MCG/0.5 injection, Inject 0.5 mLs into the muscle once for 1 dose., Disp: 0.5 mL, Rfl: 0   tiZANidine (ZANAFLEX) 4 MG tablet, Take 1 tablet (4 mg total) by mouth at bedtime., Disp: 30 tablet, Rfl: 0   UNABLE TO FIND, CPAP for OSA (6cm-19cm), Disp: , Rfl:    Zoster Vaccine Adjuvanted (SHINGRIX) injection, Inject 0.5 mLs into the muscle once for 1 dose., Disp: 0.5 mL, Rfl: 0   No Known Allergies   Review of Systems  Cardiovascular:  Negative for chest pain, palpitations and leg swelling.  Gastrointestinal:  Negative for abdominal pain.  Endocrine: Negative for polydipsia, polyphagia and polyuria.  Musculoskeletal:        Left shoulder pain would like a referral, had a developing bone spur in the past, now having problems with range of motion since having his fall.  Neurological:  Negative for  dizziness and headaches.    Today's Vitals   08/11/21 0847  BP: 116/64  Pulse: 68  Temp: 97.7 F (36.5 C)  Weight: 240 lb (108.9 kg)  Height: $Remove'5\' 9"'pxvnHbB$  (1.753 m)  PainSc: 0-No pain   Body mass index is 35.44 kg/m.   Objective:  Physical Exam Vitals reviewed.  Constitutional:      General: He is not in acute distress.    Appearance: Normal appearance. He is obese.  HENT:     Head: Normocephalic and atraumatic.  Eyes:     Pupils: Pupils are equal, round, and reactive to light.  Cardiovascular:     Rate and Rhythm: Normal rate and regular rhythm.     Pulses: Normal pulses.     Heart sounds: Normal heart sounds. No murmur heard. Pulmonary:     Effort: Pulmonary effort is normal. No respiratory distress.     Breath sounds: Normal breath sounds. No wheezing.  Musculoskeletal:        General: Tenderness present. No swelling, deformity or signs of injury.     Right shoulder: Normal.     Left shoulder: No bony tenderness or crepitus. Tenderness: anterior bursa.Normal strength.     Comments: Pain with internal rotation of thumb.   Skin:    General: Skin is warm and dry.     Capillary Refill: Capillary refill takes less than 2 seconds.  Neurological:     General: No focal deficit present.     Mental Status: He is alert and oriented to person, place, and time.     Cranial Nerves: No cranial nerve deficit.     Motor: No weakness.  Psychiatric:        Mood and Affect: Mood normal.        Behavior: Behavior normal.        Thought Content: Thought content normal.        Judgment: Judgment normal.        Assessment And  Plan:     1. Type 2 diabetes mellitus with diabetic neuropathy, without long-term current use of insulin (HCC) Chronic, uncontrolled Continue with current medications Encouraged to limit intake of sugary foods and drinks Encouraged to increase physical activity to 150 minutes per week - CMP14+EGFR - Hemoglobin A1c  2. Essential hypertension Chronic, well  controlled.   3. Mixed hyperlipidemia Chronic, controlled Continue with current medications  - Lipid panel  4. Acute pain of left shoulder Limited range of motion to left shoulder and pain on palpation to anterior bursa  Will refer to [place] - Ambulatory referral to Orthopedic Surgery  5. Class 2 severe obesity due to excess calories with serious comorbidity and body mass index (BMI) of 35.0 to 35.9 in adult [facility]) Chronic Discussed healthy diet and regular exercise options  Encouraged to exercise at least 150 minutes per week with 2 days of strength training  6. Immunization due Influenza vaccine administered Encouraged to take Tylenol as needed for fever or muscle aches. Will give tetanus vaccine today while in office. TDAP will be administered to adults 16-40 years old every 10 years. - Flu Vaccine QUAD 6+ mos PF IM (Fluarix Quad PF) - Pneumococcal polysaccharide vaccine 23-valent greater than or equal to 2yo subcutaneous/IM - Zoster Vaccine Adjuvanted [facility]) injection; Inject 0.5 mLs into the muscle once for 1 dose.  Dispense: 0.5 mL; Refill: 0 - Tdap (BOOSTRIX) 5-2.5-18.5 LF-MCG/0.5 injection; Inject 0.5 mLs into the muscle once for 1 dose.  Dispense: 0.5 mL; Refill: 0     Patient was given opportunity to ask questions. Patient verbalized understanding of the plan and was able to repeat key elements of the plan. All questions were answered to their satisfaction.  [name], FNP   I, [name], FNP, have reviewed all documentation for this visit. The documentation on 08/11/21 for the exam, diagnosis, procedures, and orders are all accurate and complete.   IF YOU HAVE BEEN REFERRED TO A SPECIALIST, IT MAY TAKE 1-2 WEEKS TO SCHEDULE/PROCESS THE REFERRAL. IF YOU HAVE NOT HEARD FROM US/SPECIALIST IN TWO WEEKS, PLEASE GIVE [place] A CALL AT [phone].   THE PATIENT IS ENCOURAGED TO PRACTICE SOCIAL DISTANCING DUE TO THE COVID-19 PANDEMIC.

## 2021-08-11 NOTE — Addendum Note (Signed)
Addended by: [name] F on: 08/11/2021 02:00 PM   Modules accepted: Orders

## 2021-08-11 NOTE — Patient Instructions (Signed)

## 2021-08-12 LAB — CMP14+EGFR
ALT: 41 IU/L (ref 0–44)
AST: 42 IU/L — ABNORMAL HIGH (ref 0–40)
Albumin/Globulin Ratio: 1.9 (ref 1.2–2.2)
Albumin: 4.7 g/dL (ref 3.8–4.9)
Alkaline Phosphatase: 78 IU/L (ref 44–121)
BUN/Creatinine Ratio: 15 (ref 9–20)
BUN: 17 mg/dL (ref 6–24)
Bilirubin Total: 0.5 mg/dL (ref 0.0–1.2)
CO2: 24 mmol/L (ref 20–29)
Calcium: 9.6 mg/dL (ref 8.7–10.2)
Chloride: 99 mmol/L (ref 96–106)
Creatinine, Ser: 1.12 mg/dL (ref 0.76–1.27)
Globulin, Total: 2.5 g/dL (ref 1.5–4.5)
Glucose: 236 mg/dL — ABNORMAL HIGH (ref 70–99)
Potassium: 4.8 mmol/L (ref 3.5–5.2)
Sodium: 137 mmol/L (ref 134–144)
Total Protein: 7.2 g/dL (ref 6.0–8.5)
eGFR: 78 mL/min/{1.73_m2} (ref >59)

## 2021-08-12 LAB — LIPID PANEL
Chol/HDL Ratio: 7.1 ratio — ABNORMAL HIGH (ref 0.0–5.0)
Cholesterol, Total: 171 mg/dL (ref 100–199)
HDL: 24 mg/dL — ABNORMAL LOW (ref >39)
LDL Chol Calc (NIH): 78 mg/dL (ref 0–99)
Triglycerides: 433 mg/dL — ABNORMAL HIGH (ref 0–149)
VLDL Cholesterol Cal: 69 mg/dL — ABNORMAL HIGH (ref 5–40)

## 2021-08-12 LAB — HEMOGLOBIN A1C
Est. average glucose Bld gHb Est-mCnc: 177 mg/dL
Hgb A1c MFr Bld: 7.8 % — ABNORMAL HIGH (ref 4.8–5.6)

## 2021-08-19 ENCOUNTER — Telehealth: Payer: Self-pay

## 2021-08-19 NOTE — Chronic Care Management (AMB) (Addendum)
    Chronic Care Management Pharmacy Assistant   Name: Stephen Chase  MRN: [id number] DOB: Aug 31, 1966   Reason for Encounter: PAP follow up  Medications: Outpatient Encounter Medications as of 08/19/2021  Medication Sig   Accu-Chek Softclix Lancets lancets Use as instructed   aspirin EC 81 MG tablet Take 81 mg by mouth daily.   atorvastatin (LIPITOR) 40 MG tablet TAKE 1 TABLET(40 MG) BY MOUTH AT BEDTIME   Blood Glucose Monitoring Suppl (ACCU-CHEK GUIDE) w/Device KIT Use to check blood sugars three times daily E11.9   carvedilol (COREG) 6.25 MG tablet TAKE 1 TABLET(6.25 MG) BY MOUTH TWICE DAILY WITH A MEAL   cholecalciferol (VITAMIN D3) 25 MCG (1000 UNIT) tablet Take 1,000 Units by mouth daily.   Cyanocobalamin (B-12 PO) Take by mouth daily.   cyclobenzaprine (FLEXERIL) 10 MG tablet Take 1 tablet (10 mg total) by mouth 3 (three) times daily as needed for muscle spasms.   fenofibrate (TRICOR) 145 MG tablet TAKE 1 TABLET(145 MG) BY MOUTH DAILY   glucose blood (ACCU-CHEK GUIDE) test strip CHECK BLOOD SUGAR 3 TIMES A DAY BEFORE BREAKFAST, LUNCH, AND DINNER   Icosapent Ethyl (VASCEPA) 1 g CAPS One bid for elevated triglycerides   Insulin Pen Needle (PEN NEEDLES) 32G X 4 MM MISC Inject 1 each into the skin daily.   lisinopril (ZESTRIL) 20 MG tablet TAKE 1 TABLET(20 MG) BY MOUTH DAILY   Magnesium 250 MG TABS Take 1 tablet (250 mg total) by mouth every evening. With evening meals   modafinil (PROVIGIL) 200 MG tablet TAKE 1 TABLET(200 MG) BY MOUTH DAILY   Multiple Vitamins-Minerals (MULTIVITAMIN WITH MINERALS) tablet Take 1 tablet by mouth daily.   naproxen (NAPROSYN) 375 MG tablet Take 1 tablet (375 mg total) by mouth 2 (two) times daily with a meal.   niacin (NIASPAN) 500 MG CR tablet Take 2,000 mg by mouth at bedtime.    olmesartan-hydrochlorothiazide (BENICAR HCT) 40-25 MG tablet TAKE 1 TABLET BY MOUTH DAILY   Omega-3 Fatty Acids (FISH OIL) 1000 MG CPDR Take by mouth. Take one tablet daily    omeprazole (PRILOSEC) 10 MG capsule Take 1 capsule (10 mg total) by mouth daily.   OVER THE COUNTER MEDICATION Vitamin D 3 One capsule daily.   Semaglutide, 2 MG/DOSE, (OZEMPIC, 2 MG/DOSE,) 8 MG/3ML SOPN Inject 2 mg into the skin once a week.   tamsulosin (FLOMAX) 0.4 MG CAPS capsule TAKE 1 CAPSULE BY MOUTH EVERY DAY 30 MINUTES AFTER THE SAME MEAL   tiZANidine (ZANAFLEX) 4 MG tablet Take 1 tablet (4 mg total) by mouth at bedtime.   UNABLE TO FIND CPAP for OSA (6cm-19cm)   No facility-administered encounter medications on file as of 08/19/2021.   08-19-2021: Left patient voicemail's on both lines and sent a text message to follow up on Ozempic patient assistance  [place] Pharmacist Assistant [phone]

## 2021-08-21 ENCOUNTER — Telehealth: Payer: Self-pay

## 2021-08-21 NOTE — Chronic Care Management (AMB) (Signed)
    Chronic Care Management Pharmacy Assistant   Name: Stephen Chase  MRN: [id number] DOB: 1966/07/07   Reason for Encounter: PAP   Medications: Outpatient Encounter Medications as of 08/21/2021  Medication Sig   Accu-Chek Softclix Lancets lancets Use as instructed   aspirin EC 81 MG tablet Take 81 mg by mouth daily.   atorvastatin (LIPITOR) 40 MG tablet TAKE 1 TABLET(40 MG) BY MOUTH AT BEDTIME   Blood Glucose Monitoring Suppl (ACCU-CHEK GUIDE) w/Device KIT Use to check blood sugars three times daily E11.9   carvedilol (COREG) 6.25 MG tablet TAKE 1 TABLET(6.25 MG) BY MOUTH TWICE DAILY WITH A MEAL   cholecalciferol (VITAMIN D3) 25 MCG (1000 UNIT) tablet Take 1,000 Units by mouth daily.   Cyanocobalamin (B-12 PO) Take by mouth daily.   cyclobenzaprine (FLEXERIL) 10 MG tablet Take 1 tablet (10 mg total) by mouth 3 (three) times daily as needed for muscle spasms.   fenofibrate (TRICOR) 145 MG tablet TAKE 1 TABLET(145 MG) BY MOUTH DAILY   glucose blood (ACCU-CHEK GUIDE) test strip CHECK BLOOD SUGAR 3 TIMES A DAY BEFORE BREAKFAST, LUNCH, AND DINNER   Icosapent Ethyl (VASCEPA) 1 g CAPS One bid for elevated triglycerides   Insulin Pen Needle (PEN NEEDLES) 32G X 4 MM MISC Inject 1 each into the skin daily.   lisinopril (ZESTRIL) 20 MG tablet TAKE 1 TABLET(20 MG) BY MOUTH DAILY   Magnesium 250 MG TABS Take 1 tablet (250 mg total) by mouth every evening. With evening meals   modafinil (PROVIGIL) 200 MG tablet TAKE 1 TABLET(200 MG) BY MOUTH DAILY   Multiple Vitamins-Minerals (MULTIVITAMIN WITH MINERALS) tablet Take 1 tablet by mouth daily.   naproxen (NAPROSYN) 375 MG tablet Take 1 tablet (375 mg total) by mouth 2 (two) times daily with a meal.   niacin (NIASPAN) 500 MG CR tablet Take 2,000 mg by mouth at bedtime.    olmesartan-hydrochlorothiazide (BENICAR HCT) 40-25 MG tablet TAKE 1 TABLET BY MOUTH DAILY   Omega-3 Fatty Acids (FISH OIL) 1000 MG CPDR Take by mouth. Take one tablet daily   omeprazole  (PRILOSEC) 10 MG capsule Take 1 capsule (10 mg total) by mouth daily.   OVER THE COUNTER MEDICATION Vitamin D 3 One capsule daily.   Semaglutide, 2 MG/DOSE, (OZEMPIC, 2 MG/DOSE,) 8 MG/3ML SOPN Inject 2 mg into the skin once a week.   tamsulosin (FLOMAX) 0.4 MG CAPS capsule TAKE 1 CAPSULE BY MOUTH EVERY DAY 30 MINUTES AFTER THE SAME MEAL   tiZANidine (ZANAFLEX) 4 MG tablet Take 1 tablet (4 mg total) by mouth at bedtime.   UNABLE TO FIND CPAP for OSA (6cm-19cm)   No facility-administered encounter medications on file as of 08/21/2021.   08-21-2021: Patient returned call in reference to [place]. Patient is out of Ozempic and confirmed [id number] application was dropped off at the office. Requested a call back from [facility]. Was informed that patient is maxed out of refills for 2022 and will need to submit a application with dose increase for 2023. Patient stated application was recently dropped off and he is out of the medication. [place] CMA stated a sample will be at the office for him. Updated patient.  [place] Pharmacist Assistant [phone]

## 2021-08-29 DIAGNOSIS — M25512 Pain in left shoulder: Secondary | ICD-10-CM | POA: Diagnosis not present

## 2021-09-01 ENCOUNTER — Other Ambulatory Visit: Payer: Self-pay

## 2021-09-01 MED ORDER — ACCU-CHEK SOFTCLIX LANCETS MISC: 3 refills | Status: DC

## 2021-09-01 MED ORDER — GLUCOSE BLOOD VI STRP: ORAL_STRIP | 12 refills | Status: DC

## 2021-09-02 DIAGNOSIS — M25512 Pain in left shoulder: Secondary | ICD-10-CM | POA: Diagnosis not present

## 2021-09-05 DIAGNOSIS — M75122 Complete rotator cuff tear or rupture of left shoulder, not specified as traumatic: Secondary | ICD-10-CM | POA: Diagnosis not present

## 2021-09-09 ENCOUNTER — Ambulatory Visit: Payer: Medicare PPO

## 2021-09-11 ENCOUNTER — Telehealth: Payer: Self-pay

## 2021-09-11 NOTE — Progress Notes (Signed)
Chronic Care Management Pharmacy Assistant   Name: Stephen Chase  MRN: [id number] DOB: Jun 18, 1966   Reason for Encounter: Disease State/ Diabetes Adherence Call    Recent office visits:   08/21/2021 Stephen Chase CMA (CCM) - return call concerning PAP application for Ozempic.   08/11/2021 Stephen Kennedy FNP (PCP) Tetanus-Diphth-Acell Pertussis 5-2.5-18.5 LF-MCG/0.5 0.5 mLs IM, Zoster Vac Recomb Adjuvanted 50 MCG/0.5ML 0.5 mLs IM given  08/07/2021 Stephen Chase CMA (CCM) Initiated 2023 patient assistance for Ozempic.    Recent consult visits: None  Hospital visits:  Medication Reconciliation was completed by comparing discharge summary, patients EMR and Pharmacy list, and upon discussion with patient.  On 08/07/2021 due to Fall was seen at  [facility].    New?Medications Started Naproxen 375 mg Oral 2 times daily with meals, TiZANidine HCl 4 mg Oral Daily at bedtime   Medications that remain the same after Hospital Discharge:??  -All other medications will remain the same.    Medications: Outpatient Encounter Medications as of 09/11/2021  Medication Sig   Accu-Chek Softclix Lancets lancets Use as instructed to test blood sugars 3 times a day dx code e11.65   aspirin EC 81 MG tablet Take 81 mg by mouth daily.   atorvastatin (LIPITOR) 40 MG tablet TAKE 1 TABLET(40 MG) BY MOUTH AT BEDTIME   Blood Glucose Monitoring Suppl (ACCU-CHEK GUIDE) w/Device KIT Use to check blood sugars three times daily E11.9   carvedilol (COREG) 6.25 MG tablet TAKE 1 TABLET(6.25 MG) BY MOUTH TWICE DAILY WITH A MEAL   cholecalciferol (VITAMIN D3) 25 MCG (1000 UNIT) tablet Take 1,000 Units by mouth daily.   Cyanocobalamin (B-12 PO) Take by mouth daily.   cyclobenzaprine (FLEXERIL) 10 MG tablet Take 1 tablet (10 mg total) by mouth 3 (three) times daily as needed for muscle spasms.   fenofibrate (TRICOR) 145 MG tablet TAKE 1 TABLET(145 MG) BY MOUTH DAILY   glucose blood  (ACCU-CHEK GUIDE) test strip CHECK BLOOD SUGAR 3 TIMES A DAY BEFORE BREAKFAST, LUNCH, AND DINNER   glucose blood test strip Use as instructed to test blood sugars 3 times a day dx code e11.65   Icosapent Ethyl (VASCEPA) 1 g CAPS One bid for elevated triglycerides   Insulin Pen Needle (PEN NEEDLES) 32G X 4 MM MISC Inject 1 each into the skin daily.   lisinopril (ZESTRIL) 20 MG tablet TAKE 1 TABLET(20 MG) BY MOUTH DAILY   Magnesium 250 MG TABS Take 1 tablet (250 mg total) by mouth every evening. With evening meals   modafinil (PROVIGIL) 200 MG tablet TAKE 1 TABLET(200 MG) BY MOUTH DAILY   Multiple Vitamins-Minerals (MULTIVITAMIN WITH MINERALS) tablet Take 1 tablet by mouth daily.   naproxen (NAPROSYN) 375 MG tablet Take 1 tablet (375 mg total) by mouth 2 (two) times daily with a meal.   niacin (NIASPAN) 500 MG CR tablet Take 2,000 mg by mouth at bedtime.    olmesartan-hydrochlorothiazide (BENICAR HCT) 40-25 MG tablet TAKE 1 TABLET BY MOUTH DAILY   Omega-3 Fatty Acids (FISH OIL) 1000 MG CPDR Take by mouth. Take one tablet daily   omeprazole (PRILOSEC) 10 MG capsule Take 1 capsule (10 mg total) by mouth daily.   OVER THE COUNTER MEDICATION Vitamin D 3 One capsule daily.   Semaglutide, 2 MG/DOSE, (OZEMPIC, 2 MG/DOSE,) 8 MG/3ML SOPN Inject 2 mg into the skin once a week.   tamsulosin (FLOMAX) 0.4 MG CAPS capsule TAKE 1 CAPSULE BY MOUTH EVERY DAY 30 MINUTES AFTER THE  SAME MEAL   tiZANidine (ZANAFLEX) 4 MG tablet Take 1 tablet (4 mg total) by mouth at bedtime.   UNABLE TO FIND CPAP for OSA (6cm-19cm)   No facility-administered encounter medications on file as of 09/11/2021.   Recent Relevant Labs: Lab Results  Component Value Date/Time   HGBA1C 7.8 (H) 08/11/2021 09:35 AM   HGBA1C 8.4 (H) 04/17/2021 10:25 AM   MICROALBUR 10 04/17/2021 11:13 AM   MICROALBUR 80 04/10/2020 11:49 AM    Kidney Function Lab Results  Component Value Date/Time   CREATININE 1.12 08/11/2021 09:35 AM   CREATININE 1.64  (H) 04/17/2021 10:25 AM   GFRNONAA 72 10/17/2020 02:37 PM   GFRAA 84 10/17/2020 02:37 PM      Adherence Review: Is the patient currently on a STATIN medication? Yes Is the patient currently on ACE/ARB medication? Yes Does the patient have >5 day gap between last estimated fill dates? Yes   Called patient 09/11/2021 left message Called patient 09/16/2021 left message Called patient 09/17/2021 left message  Care Gaps: Last eye exam- 07/17/2020 Last Annual Wellness Visit- 04/17/2021 Last Diabetic Foot Exam- 08/04/2021    Star Rating Drugs:  Medication:  Last Fill: Day Supply  Ozempic 2 mg                     Patient assistance Olmesartan/HCTZ 40-25 mg         07/13, 10/12     90 DS  Atorvastatin 40 mg           06/01, 9/29             90 DS Lisinopril 20 mg               04/11, 07/12            90 DS       [place] Clinical Pharmacist Assistant [phone]

## 2021-09-16 ENCOUNTER — Other Ambulatory Visit: Payer: Self-pay

## 2021-09-16 ENCOUNTER — Encounter: Payer: Self-pay | Admitting: Nurse Practitioner

## 2021-09-16 ENCOUNTER — Ambulatory Visit (INDEPENDENT_AMBULATORY_CARE_PROVIDER_SITE_OTHER): Payer: Medicare PPO

## 2021-09-16 DIAGNOSIS — Z23 Encounter for immunization: Secondary | ICD-10-CM

## 2021-09-18 ENCOUNTER — Other Ambulatory Visit: Payer: Self-pay | Admitting: Nurse Practitioner

## 2021-09-24 ENCOUNTER — Other Ambulatory Visit: Payer: Self-pay | Admitting: Nurse Practitioner

## 2021-09-25 ENCOUNTER — Other Ambulatory Visit: Payer: Self-pay | Admitting: Nurse Practitioner

## 2021-09-25 ENCOUNTER — Telehealth: Payer: Self-pay

## 2021-09-25 ENCOUNTER — Other Ambulatory Visit: Payer: Self-pay

## 2021-09-25 DIAGNOSIS — E1140 Type 2 diabetes mellitus with diabetic neuropathy, unspecified: Secondary | ICD-10-CM

## 2021-09-25 MED ORDER — OZEMPIC (2 MG/DOSE) 8 MG/3ML ~~LOC~~ SOPN: 2.0000 mg | PEN_INJECTOR | SUBCUTANEOUS | 1 refills | Status: DC

## 2021-09-25 NOTE — Progress Notes (Signed)
° ° °  Chronic Care Management Pharmacy Assistant   Name: Garey Alleva  MRN: [id number] DOB: [birth year]   Reason for Encounter: Ozempic out of stock.   Medications: Outpatient Encounter Medications as of 09/25/2021  Medication Sig   Accu-Chek Softclix Lancets lancets Use as instructed to test blood sugars 3 times a day dx code e11.65   aspirin EC 81 MG tablet Take 81 mg by mouth daily.   atorvastatin (LIPITOR) 40 MG tablet TAKE 1 TABLET(40 MG) BY MOUTH AT BEDTIME   Blood Glucose Monitoring Suppl (ACCU-CHEK GUIDE) w/Device KIT Use to check blood sugars three times daily E11.9   carvedilol (COREG) 6.25 MG tablet TAKE 1 TABLET(6.25 MG) BY MOUTH TWICE DAILY WITH A MEAL   cholecalciferol (VITAMIN D3) 25 MCG (1000 UNIT) tablet Take 1,000 Units by mouth daily.   Cyanocobalamin (B-12 PO) Take by mouth daily.   cyclobenzaprine (FLEXERIL) 10 MG tablet Take 1 tablet (10 mg total) by mouth 3 (three) times daily as needed for muscle spasms.   fenofibrate (TRICOR) 145 MG tablet TAKE 1 TABLET(145 MG) BY MOUTH DAILY   glucose blood (ACCU-CHEK GUIDE) test strip CHECK BLOOD SUGAR 3 TIMES A DAY BEFORE BREAKFAST, LUNCH, AND DINNER   glucose blood test strip Use as instructed to test blood sugars 3 times a day dx code e11.65   Icosapent Ethyl (VASCEPA) 1 g CAPS One bid for elevated triglycerides   Insulin Pen Needle (PEN NEEDLES) 32G X 4 MM MISC Inject 1 each into the skin daily.   lisinopril (ZESTRIL) 20 MG tablet TAKE 1 TABLET(20 MG) BY MOUTH DAILY   Magnesium 250 MG TABS Take 1 tablet (250 mg total) by mouth every evening. With evening meals   modafinil (PROVIGIL) 200 MG tablet TAKE 1 TABLET(200 MG) BY MOUTH DAILY   Multiple Vitamins-Minerals (MULTIVITAMIN WITH MINERALS) tablet Take 1 tablet by mouth daily.   naproxen (NAPROSYN) 375 MG tablet Take 1 tablet (375 mg total) by mouth 2 (two) times daily with a meal.   niacin (NIASPAN) 500 MG CR tablet Take 2,000 mg by mouth at bedtime.     olmesartan-hydrochlorothiazide (BENICAR HCT) 40-25 MG tablet TAKE 1 TABLET BY MOUTH DAILY   Omega-3 Fatty Acids (FISH OIL) 1000 MG CPDR Take by mouth. Take one tablet daily   omeprazole (PRILOSEC) 10 MG capsule Take 1 capsule (10 mg total) by mouth daily.   OVER THE COUNTER MEDICATION Vitamin D 3 One capsule daily.   Semaglutide, 2 MG/DOSE, (OZEMPIC, 2 MG/DOSE,) 8 MG/3ML SOPN Inject 2 mg into the skin once a week.   tamsulosin (FLOMAX) 0.4 MG CAPS capsule TAKE 1 CAPSULE BY MOUTH EVERY DAY 30 MINUTES AFTER THE SAME MEAL   tiZANidine (ZANAFLEX) 4 MG tablet Take 1 tablet (4 mg total) by mouth at bedtime.   UNABLE TO FIND CPAP for OSA (6cm-19cm)   No facility-administered encounter medications on file as of 09/25/2021.     09/25/2021 Spoke with patient about Ozempic being out of stock at [facility]. I called [facility] to confirm medication in stock then sent message to [place] at [facility] to send prescription to [place]. I called patient back to informed him to call [facility] to make sure the prescription was ready to pick up before going to the pharmacy.    [place] Pharmacist Assistant [phone]

## 2021-10-02 ENCOUNTER — Encounter: Payer: Self-pay | Admitting: Nurse Practitioner

## 2021-10-06 ENCOUNTER — Other Ambulatory Visit: Payer: Self-pay | Admitting: Nurse Practitioner

## 2021-10-07 ENCOUNTER — Telehealth: Payer: Self-pay

## 2021-10-07 NOTE — Telephone Encounter (Signed)
I have completed a PA for Modafinil 200mg  on CMM, Key: [id number] - PA Case ID: [id number]. Received instant approval.   PA Case: [id number], Status: Approved, Coverage Starts on: 09/21/2021 12:00:00 AM, Coverage Ends on: 09/20/2022 12:00:00 AM

## 2021-10-08 ENCOUNTER — Encounter: Payer: Self-pay | Admitting: *Deleted

## 2021-10-09 ENCOUNTER — Encounter: Payer: Self-pay | Admitting: Adult Health

## 2021-10-09 ENCOUNTER — Ambulatory Visit: Payer: Medicare PPO | Admitting: Adult Health

## 2021-10-09 VITALS — BP 104/61 | HR 84 | Ht 69.0 in | Wt 241.4 lb

## 2021-10-09 DIAGNOSIS — Z9989 Dependence on other enabling machines and devices: Secondary | ICD-10-CM | POA: Diagnosis not present

## 2021-10-09 DIAGNOSIS — G4733 Obstructive sleep apnea (adult) (pediatric): Secondary | ICD-10-CM | POA: Diagnosis not present

## 2021-10-09 DIAGNOSIS — G4710 Hypersomnia, unspecified: Secondary | ICD-10-CM

## 2021-10-09 NOTE — Progress Notes (Signed)
PATIENT: Stephen Chase DOB: 01/30/1966  REASON FOR VISIT: follow up HISTORY FROM: patient, sign language interpreter present  HISTORY OF PRESENT ILLNESS: Today 10/09/21:  Stephen Chase is a 56 year old male with a history of obstructive sleep apnea on CPAP.  He returns today for follow-up.  He reports that CPAP works well for him.  He denies any new issues.  He returns today for follow-up.  10/09/20: Stephen Chase is a 56 year old male with a history of obstructive sleep apnea on CPAP.  Patient reports that he tolerates the CPAP well.  He does find it beneficial.  Denies any new issues.  His CPAP report is indicates:  Compliance Report Usage 09/09/2020 - 10/08/2020 Usage days 30/30 days (100%) >= 4 hours 27 days (90%) Average usage (days used) 5 hours 50 minutes  AirSense 10 AutoSet Serial number [id number] Mode AutoSet Min Pressure 6 cmH2O Max Pressure 19 cmH2O EPR Fulltime EPR level 3  Therapy Pressure - cmH2O Median: 10.0 95th percentile: 14.5 Maximum: 16.3 Leaks - L/min Median: 0.0 95th percentile: 2.1 Maximum: 32.8 Events per hour AI: 1.8 HI: 0.9 AHI: 2.7 Apnea Index Central: 0.2 Obstructive: 1.6 Unknown: 0.0   10/04/19: Stephen Chase is a 56 year old male with a history of obstructive sleep apnea on CPAP.  His download indicates that he uses machine 29 out of 30 days for compliance of 97%.  He uses machine greater than 4 hours 25 days for compliance of 83%.  On average he uses his machine 5 hours and 59 minutes.  His residual AHI is 2.7 on 6 to 19 cm of water with EPR of 3.  His leak in the 95th percentile is 6.6 L/min.  He states that he continues to take modafinil.  He reports that this offers him good benefit.  He states that he was confused about Flomax and was taking it during the day and this was causing daytime sleepiness.  Now that he is taking it at bedtime his sleepiness has improved.  He returns today for an evaluation.  HISTORY (Copied from Dr.Dohmeier's note) Stephen Chase  is a 56 y.o. male , seen here as in a referral  from [facility] for a re- evaluation of sleep apnea,in the presence of his sign Ecologist.  09-28-2018, follow up on CPAP titration and compliance.  Stephen Chase was evaluated by a home sleep study dated 08 June 2018 by watch pat device.  The total AHI was 16.6/h there was mild to moderate snoring noticed, oxygen nadir was 89%, average heart rate was 56 bpm.  The patient had already been on CPAP before and we continued CPAP treatment with an auto titration device.  I have the opportunity to see the excellent compliance the patient has used the machine 30 out of 30 days and 26 of those days over 4 hours.  Average use of time is 5 hours 58 minutes each night the AutoSet has a pressure window between 6 cmH2O and 19 cm water pressure with 3 cm expiratory pressure relief.  The 95th percentile pressure is 16 cmH2O with an AHI of 6.2 this seems to be related to unknown events at 2.5/h and I am not quite sure where they come from.   He does not have central apneas and he has very minimal air leakage.  I noticed that only 3 out of 30 nights had a very high AHI and this may have been a artifactual count the general average AHI is truly under 2/h.  REVIEW OF SYSTEMS: Out of a complete 14 system review of symptoms, the patient complains only of the following symptoms, and all other reviewed systems are negative.  Epworth sleepiness score 10   ALLERGIES: No Known Allergies  HOME MEDICATIONS: Outpatient Medications Prior to Visit  Medication Sig Dispense Refill   Accu-Chek Softclix Lancets lancets Use as instructed to test blood sugars 3 times a day dx code e11.65 200 each 3   aspirin EC 81 MG tablet Take 81 mg by mouth daily.     atorvastatin (LIPITOR) 40 MG tablet TAKE 1 TABLET(40 MG) BY MOUTH AT BEDTIME 90 tablet 1   Blood Glucose Monitoring Suppl (ACCU-CHEK GUIDE) w/Device KIT Use to check blood sugars three times daily E11.9 1 kit 0    carvedilol (COREG) 6.25 MG tablet TAKE 1 TABLET(6.25 MG) BY MOUTH TWICE DAILY WITH A MEAL 180 tablet 1   cholecalciferol (VITAMIN D3) 25 MCG (1000 UNIT) tablet Take 1,000 Units by mouth daily.     Cyanocobalamin (B-12 PO) Take by mouth daily.     cyclobenzaprine (FLEXERIL) 10 MG tablet Take 1 tablet (10 mg total) by mouth 3 (three) times daily as needed for muscle spasms. 30 tablet 0   fenofibrate (TRICOR) 145 MG tablet TAKE 1 TABLET(145 MG) BY MOUTH DAILY 90 tablet 1   glucose blood (ACCU-CHEK GUIDE) test strip CHECK BLOOD SUGAR 3 TIMES A DAY BEFORE BREAKFAST, LUNCH, AND DINNER 150 strip 2   glucose blood test strip Use as instructed to test blood sugars 3 times a day dx code e11.65 100 each 12   Icosapent Ethyl (VASCEPA) 1 g CAPS One bid for elevated triglycerides 180 capsule 0   Insulin Pen Needle (PEN NEEDLES) 32G X 4 MM MISC Inject 1 each into the skin daily. 30 each 2   lisinopril (ZESTRIL) 20 MG tablet TAKE 1 TABLET(20 MG) BY MOUTH DAILY 90 tablet 1   Magnesium 250 MG TABS Take 1 tablet (250 mg total) by mouth every evening. With evening meals 30 tablet 2   modafinil (PROVIGIL) 200 MG tablet TAKE 1 TABLET(200 MG) BY MOUTH DAILY 30 tablet 5   Multiple Vitamins-Minerals (MULTIVITAMIN WITH MINERALS) tablet Take 1 tablet by mouth daily.     naproxen (NAPROSYN) 375 MG tablet Take 1 tablet (375 mg total) by mouth 2 (two) times daily with a meal. 30 tablet 0   niacin (NIASPAN) 500 MG CR tablet Take 2,000 mg by mouth at bedtime.      olmesartan-hydrochlorothiazide (BENICAR HCT) 40-25 MG tablet TAKE 1 TABLET BY MOUTH DAILY 90 tablet 1   Omega-3 Fatty Acids (FISH OIL) 1000 MG CPDR Take by mouth. Take one tablet daily     omeprazole (PRILOSEC) 10 MG capsule TAKE 1 CAPSULE(10 MG) BY MOUTH DAILY 90 capsule 1   OVER THE COUNTER MEDICATION Vitamin D 3 One capsule daily.     Semaglutide, 2 MG/DOSE, (OZEMPIC, 2 MG/DOSE,) 8 MG/3ML SOPN Inject 2 mg into the skin once a week. 9 mL 1   tamsulosin (FLOMAX) 0.4  MG CAPS capsule TAKE 1 CAPSULE BY MOUTH EVERY DAY 30 MINUTES AFTER THE SAME MEAL 90 capsule 1   tiZANidine (ZANAFLEX) 4 MG tablet Take 1 tablet (4 mg total) by mouth at bedtime. 30 tablet 0   UNABLE TO FIND CPAP for OSA (6cm-19cm)     No facility-administered medications prior to visit.    PAST MEDICAL HISTORY: Past Medical History:  Diagnosis Date   Allergy    Arthritis    SHOULDERS  Balanitis    Deafness, sensorineural, childhood onset    BILATERAL SINCE AGE 40  SECONDARY TO UNKNOWN ILLNESS   GERD (gastroesophageal reflux disease)    Hyperlipidemia    Hypertension    No blood products    Patient is Jehovah Witness no blood products.   OSA on CPAP    SEVERE PER STUDY 03-09-2009   Sleep apnea    uses a cpap   Smokers' cough ([place])    Type 2 diabetes mellitus ([place])     PAST SURGICAL HISTORY: Past Surgical History:  Procedure Laterality Date   CIRCUMCISION N/A 12/19/2013   Procedure: CIRCUMCISION ADULT;  Surgeon: Lowella Bandy, MD;  Location: [facility];  Service: Urology;  Laterality: N/A;   SHOULDER SURGERY Right    WRIST SURGERY Left     FAMILY HISTORY: Family History  Problem Relation Age of Onset   Cancer Mother        type unknown pt was 40 years old at that time   Liver disease Father    Healthy Brother    Cancer Other         alot of family members have cancer but doesnt know the type   Colon cancer Neg Hx    Esophageal cancer Neg Hx    Rectal cancer Neg Hx    Stomach cancer Neg Hx    Sleep apnea Neg Hx     SOCIAL HISTORY: Social History   Socioeconomic History   Marital status: Married    Spouse name: Not on file   Number of children: 2   Years of education: Not on file   Highest education level: Not on file  Occupational History   Occupation: unemployed  Tobacco Use   Smoking status: Former    Packs/day: 1.00    Years: 30.00    Pack years: 30.00    Types: Cigarettes   Smokeless tobacco: Never   Tobacco comments:    quit 4  years ago  Vaping Use   Vaping Use: Never used  Substance and Sexual Activity   Alcohol use: Not Currently    Comment: rarely   Drug use: No   Sexual activity: Yes  Other Topics Concern   Not on file  Social History Narrative   Not on file   Social Determinants of Health   Financial Resource Strain: Low Risk    Difficulty of Paying Living Expenses: Not hard at all  Food Insecurity: No Food Insecurity   Worried About Charity fundraiser in the Last Year: Never true   [place] in the Last Year: Never true  Transportation Needs: No Transportation Needs   Lack of Transportation (Medical): No   Lack of Transportation (Non-Medical): No  Physical Activity: Inactive   Days of Exercise per Week: 0 days   Minutes of Exercise per Session: 0 min  Stress: No Stress Concern Present   Feeling of Stress : Not at all  Social Connections: Not on file  Intimate Partner Violence: Not on file      PHYSICAL EXAM  There were no vitals filed for this visit.  There is no height or weight on file to calculate BMI.  Generalized: Well developed, in no acute distress  Chest: Lungs clear to auscultation bilaterally  Neurological examination  Mentation: Alert oriented to time, place, history taking. Follows all commands speech and language fluent Cranial nerve II-XII: Extraocular movements were full, visual field were full on confrontational test Head turning and shoulder  shrug  were normal and symmetric. Motor: The motor testing reveals 5 over 5 strength of all 4 extremities. Good symmetric motor tone is noted throughout.  Sensory: Sensory testing is intact to soft touch on all 4 extremities. No evidence of extinction is noted.  Gait and station: Gait is normal.    DIAGNOSTIC DATA (LABS, IMAGING, TESTING) - I reviewed patient records, labs, notes, testing and imaging myself where available.  Lab Results  Component Value Date   WBC 6.1 02/22/2020   HGB 15.1 02/22/2020   HCT 44.0  02/22/2020   MCV 89 02/22/2020   PLT 158 02/22/2020      Component Value Date/Time   NA 137 08/11/2021 0935   K 4.8 08/11/2021 0935   CL 99 08/11/2021 0935   CO2 24 08/11/2021 0935   GLUCOSE 236 (H) 08/11/2021 0935   GLUCOSE 126 (H) 12/19/2013 0937   BUN 17 08/11/2021 0935   CREATININE 1.12 08/11/2021 0935   CALCIUM 9.6 08/11/2021 0935   PROT 7.2 08/11/2021 0935   ALBUMIN 4.7 08/11/2021 0935   AST 42 (H) 08/11/2021 0935   ALT 41 08/11/2021 0935   ALKPHOS 78 08/11/2021 0935   BILITOT 0.5 08/11/2021 0935   GFRNONAA 72 10/17/2020 1437   GFRAA 84 10/17/2020 1437   Lab Results  Component Value Date   CHOL 171 08/11/2021   HDL 24 (L) 08/11/2021   LDLCALC 78 08/11/2021   TRIG 433 (H) 08/11/2021   CHOLHDL 7.1 (H) 08/11/2021   Lab Results  Component Value Date   HGBA1C 7.8 (H) 08/11/2021   No results found for: [id number] Lab Results  Component Value Date   TSH 1.810 03/09/2019      ASSESSMENT AND PLAN 56 y.o. year old male  has a past medical history of Allergy, Arthritis, Balanitis, Deafness, sensorineural, childhood onset, GERD (gastroesophageal reflux disease), Hyperlipidemia, Hypertension, No blood products, OSA on CPAP, Sleep apnea, Smokers' cough ([place]), and Type 2 diabetes mellitus ([place]). here with:  Obstructive sleep apnea on CPAP Hypersomnia  Good compliance Residual AHI is slightly elevated may be due to artifact Encouraged patient to continue using CPAP nightly greater than 4 hours each night Continue Provigil 200 mg daily for hypersomnia Follow-up in 1 year or sooner if needed      Ward Givens, MSN, NP-C 10/09/2021, 9:33 AM [facility] [address], [place], [place] [address] [phone]

## 2021-10-09 NOTE — Patient Instructions (Signed)
Your Plan:  Continue CPAP COntinue Provigil      Thank you for coming to see [place] at [facility]. I hope we have been able to provide you high quality care today.  You may receive a patient satisfaction survey over the next few weeks. We would appreciate your feedback and comments so that we may continue to improve ourselves and the health of our patients.

## 2021-10-13 ENCOUNTER — Encounter: Payer: Self-pay | Admitting: Nurse Practitioner

## 2021-10-13 ENCOUNTER — Other Ambulatory Visit: Payer: Self-pay

## 2021-10-13 MED ORDER — OLMESARTAN MEDOXOMIL-HCTZ 40-25 MG PO TABS: 1.0000 | ORAL_TABLET | Freq: Every day | ORAL | 1 refills | Status: DC

## 2021-10-14 ENCOUNTER — Encounter: Payer: Self-pay | Admitting: Nurse Practitioner

## 2021-10-14 DIAGNOSIS — M7542 Impingement syndrome of left shoulder: Secondary | ICD-10-CM | POA: Diagnosis not present

## 2021-10-14 DIAGNOSIS — M24112 Other articular cartilage disorders, left shoulder: Secondary | ICD-10-CM | POA: Diagnosis not present

## 2021-10-14 DIAGNOSIS — S46012A Strain of muscle(s) and tendon(s) of the rotator cuff of left shoulder, initial encounter: Secondary | ICD-10-CM | POA: Diagnosis not present

## 2021-10-14 DIAGNOSIS — G8918 Other acute postprocedural pain: Secondary | ICD-10-CM | POA: Diagnosis not present

## 2021-10-15 HISTORY — PX: SHOULDER SURGERY: SHX246

## 2021-10-21 ENCOUNTER — Encounter: Payer: Medicare PPO | Admitting: Nurse Practitioner

## 2021-10-22 ENCOUNTER — Encounter: Payer: Self-pay | Admitting: Nurse Practitioner

## 2021-10-22 DIAGNOSIS — Z9889 Other specified postprocedural states: Secondary | ICD-10-CM | POA: Diagnosis not present

## 2021-10-22 DIAGNOSIS — M25512 Pain in left shoulder: Secondary | ICD-10-CM | POA: Diagnosis not present

## 2021-10-23 ENCOUNTER — Other Ambulatory Visit: Payer: Self-pay

## 2021-10-23 MED ORDER — TAMSULOSIN HCL 0.4 MG PO CAPS: ORAL_CAPSULE | ORAL | 1 refills | Status: DC

## 2021-10-27 NOTE — Therapy (Signed)
OUTPATIENT PHYSICAL THERAPY SHOULDER EVALUATION   Patient Name: Stephen Chase MRN: [id number] DOB:[birth year], 56 y.o., male Today's Date: 10/28/2021   PT End of Session - 10/28/21 1437     Visit Number 1    Number of Visits 25    Date for PT Re-Evaluation 01/24/22    Authorization Type [employer] MCR- requesting auth    PT Start Time 1440    PT Stop Time 1529    PT Time Calculation (min) 49 min    Activity Tolerance Patient limited by pain    Behavior During Therapy WFL for tasks assessed/performed             Past Medical History:  Diagnosis Date   Allergy    Arthritis    SHOULDERS   Balanitis    Deafness, sensorineural, childhood onset    BILATERAL SINCE AGE 56  SECONDARY TO UNKNOWN ILLNESS   GERD (gastroesophageal reflux disease)    Hyperlipidemia    Hypertension    No blood products    Patient is Jehovah Witness no blood products.   OSA on CPAP    SEVERE PER STUDY 03-09-2009   Sleep apnea    uses a cpap   Smokers' cough ([place])    Type 2 diabetes mellitus ([place])    Past Surgical History:  Procedure Laterality Date   CIRCUMCISION N/A 12/19/2013   Procedure: CIRCUMCISION ADULT;  Surgeon: Lowella Bandy, MD;  Location: [facility];  Service: Urology;  Laterality: N/A;   SHOULDER SURGERY Right    WRIST SURGERY Left    Patient Active Problem List   Diagnosis Date Noted   Hypersomnia, persistent 05/09/2018   Type 2 diabetes mellitus with diabetic neuropathy, without long-term current use of insulin ([place]) 05/09/2018   Uncontrolled type 2 diabetes mellitus with hyperglycemia ([place]) 05/09/2018   OSA on CPAP 05/09/2018   Esophageal varices without bleeding ([place]) 05/09/2018   Bilateral deafness 05/09/2018    PCP: [name], FNP  REFERRING PROVIDER: Sheryle Hail, PAC   REFERRING DIAG: s/p left shoulder RCR ,SAD   THERAPY DIAG:  Acute pain of left shoulder  Muscle weakness (generalized)   ONSET DATE: 10/14/21  SUBJECTIVE:                                                                                                                                                                                       SUBJECTIVE STATEMENT:  onsite interpreter  Patient reports the shoulder is starting to feel better since left rotator cuff repair on 10/14/21. Still painful, but this is reducing. He reports consistency in wearing his sling. He has f/u at the end of February with surgeon.  PERTINENT HISTORY: Bilateral deafness   PAIN:  Are you having pain? Yes NPRS scale: 5/10 Pain location: Lt shoulder PAIN TYPE: "like a drill."  Pain description: intermittent  Aggravating factors: movement  Relieving factors: medication   PRECAUTIONS: Shoulder  WEIGHT BEARING RESTRICTIONS No  FALLS:  Has patient fallen in last 6 months? Yes Number of falls: 1 (slipped on the deck in the rain)  LIVING ENVIRONMENT: Lives with: lives with their spouse Lives in: House/apartment Stairs: Yes; stairs to enter  Has following equipment at home: None  OCCUPATION: Self-employed   PLOF: independent with bathing and dressing   PATIENT GOALS "doctor ordered me to come; to feel better everyday."   OBJECTIVE:     PATIENT SURVEYS:  FOTO 12% function  COGNITION:  Overall cognitive status: Within functional limits for tasks assessed     SENSATION:  Light touch: not assessed    POSTURE: Forward head, rounded shoulders   PALPATION: Diffuse tenderness about Lt shoulder   UPPER EXTREMITY AROM/PROM: Rt shoulder AROM WNL with exception of functional IR is moderately limited.   A/PROM Right 10/28/2021 Left PROM 10/28/2021  Shoulder flexion  65  Shoulder extension    Shoulder abduction  69  Shoulder adduction    Shoulder internal rotation    Shoulder external rotation  8  Elbow flexion    Elbow extension    Wrist flexion    Wrist extension    Wrist ulnar deviation    Wrist radial deviation    Wrist pronation    Wrist supination    (Blank rows = not  tested)  UPPER EXTREMITY MMT: Lt shoulder/elbow MMT deferred secondary to post-op acuity   MMT Right 10/28/2021 Left 10/28/2021  Shoulder flexion    Shoulder extension    Shoulder abduction    Shoulder adduction    Shoulder internal rotation    Shoulder external rotation    Middle trapezius    Lower trapezius    Elbow flexion    Elbow extension    Wrist flexion    Wrist extension    Wrist ulnar deviation    Wrist radial deviation    Wrist pronation    Wrist supination    Grip strength (lbs)    (Blank rows = not tested)       TODAY'S TREATMENT:  [facility] Adult PT Treatment:                                                DATE: 10/28/21 Therapeutic Exercise: Demonstrated and issued initial HEP.   Therapeutic Activity: Education on assessment findings and impairments that will be addressed throughout duration of POC.       PATIENT EDUCATION: Education details: see treatment  Person educated: Patient Education method: Explanation, Demonstration, Tactile cues, Verbal cues, and Handouts Education comprehension: verbalized understanding, returned demonstration, verbal cues required, tactile cues required, and needs further education   HOME EXERCISE PROGRAM: Access Code: [email or web address] URL: [email or web address] Date: 10/28/2021 Prepared by: Gwendolyn Grant  Exercises Flexion-Extension Shoulder Pendulum with Table Support - 3 x daily - 7 x weekly - 1 sets - 60 sec hold Horizontal Shoulder Pendulum with Table Support - 3 x daily - 7 x weekly - 1 sets - 60 sec hold Putty Squeezes - 2 x daily - 7 x weekly - 2 sets - 10 reps Wrist Flexion Extension AROM with Fingers Curled  and Palm Down - 2 x daily - 7 x weekly - 2 sets - 10 reps   ASSESSMENT:  CLINICAL IMPRESSION: Patient is a 56 y.o. male who was seen today for physical therapy evaluation and treatment for s/p Lt RCR on 10/14/21. He has limited Lt shoulder ROM and strength that are consistent with his recent  post-operative status.  Patient will benefit from skilled PT to assist in restoring his ROM and strength in order to return to PLOF.   REHAB POTENTIAL: Good  CLINICAL DECISION MAKING: Stable/uncomplicated  EVALUATION COMPLEXITY: Low   GOALS: Goals reviewed with patient? No  SHORT TERM GOALS:  STG Name Target Date Goal status  1 Patient will be independent and compliant with initial HEP.   Baseline:  11/18/2021 INITIAL  2 Patient will demonstrate WNL PROM of the Lt shoulder in order to progress towards next phase of rehab.  Baseline:  12/09/2021 INITIAL   LONG TERM GOALS:   LTG Name Target Date Goal status  1 Patient will demonstrate at least 160 degrees of Lt shoulder flexion and abduction AROM to improve ability to reach overhead Baseline: 01/23/22 INITIAL  2 Patient will demonstrate at least 50 degrees of Lt shoulder IR/ER AROM to improve ability to complete self-care activities.  Baseline: 01/23/22 INITIAL  3 Patient will demonstrate at least 4/5 Lt shoulder strength to improve ability to lift items.  Baseline: 01/23/22 INITIAL  4 Patient will demonstrate 5/5 Lt elbow strength to improve ability to carry objects.  Baseline: 01/23/22 INITIAL  5 Patient will score at least 58% function on FOTO to signify clinically meaningful improvement in functional abilities.  01/23/22 INITIAL   PLAN: PT FREQUENCY: 2x/week  PT DURATION: 12 weeks  PLANNED INTERVENTIONS: Therapeutic exercises, Therapeutic activity, Neuro Muscular re-education, Patient/Family education, Joint mobilization, Aquatic Therapy, Dry Needling, Cryotherapy, Moist heat, Vasopneumatic device, Ultrasound, and Manual therapy  PLAN FOR NEXT SESSION: review HEP, progress per post-op protocol   Gwendolyn Grant, PT, DPT, ATC 10/28/21 6:01 PM   Referring diagnosis? s/p left shoulder RCR ,SAD  Treatment diagnosis? (if different than referring diagnosis)   Acute pain of left shoulder Muscle weakness (generalized) What was this  (referring dx) caused by? [x]  Surgery []  Fall []  Ongoing issue []  Arthritis []  Other: ____________  Laterality: []  Rt [x]  Lt []  Both  Check all possible CPT codes:  *CHOOSE 10 OR LESS*    [x]  97110 (Therapeutic Exercise)  []  92507 (SLP Treatment)  [x]  97112 (Neuro Re-ed)   []  92526 (Swallowing Treatment)   []  97116 (Gait Training)   []  [id number] (Cognitive Training, 1st 15 minutes) [x]  97140 (Manual Therapy)   []  97130 (Cognitive Training, each add'l 15 minutes)  [x]  97530 (Therapeutic Activities)  []  Other, List CPT Code ____________    [x]  93267 (Self Care)       []  All codes above (97110 - 97535)  []  97012 (Mechanical Traction)  [x]  97014 (E-stim Unattended)  []  97032 (E-stim manual)  []  97033 (Ionto)  [x]  97035 (Ultrasound)  []  97760 (Orthotic Fit) []  97750 (Physical Performance Training) [x]  [id number] (Aquatic Therapy) []  97034 (Contrast Bath) []  [id number] (Paraffin) []  97597 (Wound Care 1st 20 sq cm) []  97598 (Wound Care each add'l 20 sq cm) [x]  97016 (Vasopneumatic Device) []  [phone] Comptroller) []  [phone] (Prosthetic Training)

## 2021-10-28 ENCOUNTER — Ambulatory Visit: Payer: Medicare PPO | Attending: Orthopedic Surgery

## 2021-10-28 ENCOUNTER — Other Ambulatory Visit: Payer: Self-pay

## 2021-10-28 DIAGNOSIS — M25512 Pain in left shoulder: Secondary | ICD-10-CM | POA: Insufficient documentation

## 2021-10-28 DIAGNOSIS — M6281 Muscle weakness (generalized): Secondary | ICD-10-CM | POA: Diagnosis not present

## 2021-10-29 NOTE — Therapy (Signed)
OUTPATIENT PHYSICAL THERAPY TREATMENT NOTE   Patient Name: Stephen Chase MRN: [id number] DOB:[birth year], 56 y.o., male Today's Date: 10/30/2021  PCP: [name], FNP REFERRING PROVIDER: Tania Ade, MD   PT End of Session - 10/30/21 0930     Visit Number 2    Number of Visits 25    Date for PT Re-Evaluation 01/24/22    Authorization Type [employer] MCR-    Authorization Time Period 2/8-3/24    Authorization - Visit Number 1    Authorization - Number of Visits 12    Progress Note Due on Visit 10    PT Start Time 0930    PT Stop Time 1018   ice 10 minutes   PT Time Calculation (min) 48 min    Activity Tolerance Patient limited by pain    Behavior During Therapy WFL for tasks assessed/performed             Past Medical History:  Diagnosis Date   Allergy    Arthritis    SHOULDERS   Balanitis    Deafness, sensorineural, childhood onset    BILATERAL SINCE AGE 50  SECONDARY TO UNKNOWN ILLNESS   GERD (gastroesophageal reflux disease)    Hyperlipidemia    Hypertension    No blood products    Patient is Jehovah Witness no blood products.   OSA on CPAP    SEVERE PER STUDY 03-09-2009   Sleep apnea    uses a cpap   Smokers' cough ([place])    Type 2 diabetes mellitus ([place])    Past Surgical History:  Procedure Laterality Date   CIRCUMCISION N/A 12/19/2013   Procedure: CIRCUMCISION ADULT;  Surgeon: Lowella Bandy, MD;  Location: [facility];  Service: Urology;  Laterality: N/A;   SHOULDER SURGERY Right    WRIST SURGERY Left    Patient Active Problem List   Diagnosis Date Noted   Hypersomnia, persistent 05/09/2018   Type 2 diabetes mellitus with diabetic neuropathy, without long-term current use of insulin ([place]) 05/09/2018   Uncontrolled type 2 diabetes mellitus with hyperglycemia ([place]) 05/09/2018   OSA on CPAP 05/09/2018   Esophageal varices without bleeding ([place]) 05/09/2018   Bilateral deafness 05/09/2018    REFERRING DIAG:  s/p left shoulder RCR ,SAD    THERAPY DIAG:  Acute pain of left shoulder  Muscle weakness (generalized)  PERTINENT HISTORY: Bilateral deafness   PRECAUTIONS: shoulder  Interpreter utilized SUBJECTIVE: Patient reports he is feeling pretty good. He reports compliance with HEP.   PAIN:  Are you having pain? Yes NPRS scale: 5/10 Pain location: Lt shoulder  PAIN TYPE: unable to describe  Pain description: intermittent  Aggravating factors: movement Relieving factors: rest    OBJECTIVE:   *Unless otherwise noted all objective measures were captured on initial evaluation.       PATIENT SURVEYS:  FOTO 12% function   COGNITION:          Overall cognitive status: Within functional limits for tasks assessed                               SENSATION:          Light touch: not assessed      POSTURE: Forward head, rounded shoulders    PALPATION: Diffuse tenderness about Lt shoulder    UPPER EXTREMITY AROM/PROM: Rt shoulder AROM WNL with exception of functional IR is moderately limited.    A/PROM Right 10/28/2021 Left PROM 10/28/2021 10/30/21  Left PROM  Shoulder flexion   65   Shoulder extension       Shoulder abduction   69 85  Shoulder adduction       Shoulder internal rotation       Shoulder external rotation   8   Elbow flexion       Elbow extension       Wrist flexion       Wrist extension       Wrist ulnar deviation       Wrist radial deviation       Wrist pronation       Wrist supination       (Blank rows = not tested)   UPPER EXTREMITY MMT: Lt shoulder/elbow MMT deferred secondary to post-op acuity    MMT Right 10/28/2021 Left 10/28/2021  Shoulder flexion      Shoulder extension      Shoulder abduction      Shoulder adduction      Shoulder internal rotation      Shoulder external rotation      Middle trapezius      Lower trapezius      Elbow flexion      Elbow extension      Wrist flexion      Wrist extension      Wrist ulnar deviation      Wrist radial deviation      Wrist  pronation      Wrist supination      Grip strength (lbs)      (Blank rows = not tested)                    TODAY'S TREATMENT:  [facility] Adult PT Treatment:                                                DATE: 10/30/21 Therapeutic Exercise: Shoulder pendulum 3 x 30 sec Reviewed HEP Manual Therapy: Lt shoulder, elbow, and wrist PROM to tolerance in all planes  Modalities: Ice pack to Lt shoulder x 10 minutes  Self Care: Use of ice to assist in pain reduction/soreness            PATIENT EDUCATION: Education details: see treatment  Person educated: Patient Education method: Explanation Education comprehension: verbalized understanding     HOME EXERCISE PROGRAM: Access Code: [email or web address] URL: [email or web address] Date: 10/28/2021 Prepared by: Gwendolyn Grant   Exercises Flexion-Extension Shoulder Pendulum with Table Support - 3 x daily - 7 x weekly - 1 sets - 60 sec hold Horizontal Shoulder Pendulum with Table Support - 3 x daily - 7 x weekly - 1 sets - 60 sec hold Putty Squeezes - 2 x daily - 7 x weekly - 2 sets - 10 reps Wrist Flexion Extension AROM with Fingers Curled and Palm Down - 2 x daily - 7 x weekly - 2 sets - 10 reps     ASSESSMENT:   CLINICAL IMPRESSION: Fair tolerance to today's session as patient remains guarded with shoulder PROM especially with ER. Abduction PROM has significantly improved already from baseline. He was encouraged to continue with HEP and ice prn daily for pain and soreness with patient verbalizing understanding.    REHAB POTENTIAL: Good   CLINICAL DECISION MAKING: Stable/uncomplicated   EVALUATION COMPLEXITY: Low     GOALS: Goals reviewed with patient?  No   SHORT TERM GOALS:   STG Name Target Date Goal status  1 Patient will be independent and compliant with initial HEP.    Baseline:  11/18/2021 INITIAL  2 Patient will demonstrate WNL PROM of the Lt shoulder in order to progress towards next phase of rehab.  Baseline:   12/09/2021 INITIAL    LONG TERM GOALS:    LTG Name Target Date Goal status  1 Patient will demonstrate at least 160 degrees of Lt shoulder flexion and abduction AROM to improve ability to reach overhead Baseline: 01/23/22 INITIAL  2 Patient will demonstrate at least 50 degrees of Lt shoulder IR/ER AROM to improve ability to complete self-care activities.  Baseline: 01/23/22 INITIAL  3 Patient will demonstrate at least 4/5 Lt shoulder strength to improve ability to lift items.  Baseline: 01/23/22 INITIAL  4 Patient will demonstrate 5/5 Lt elbow strength to improve ability to carry objects.  Baseline: 01/23/22 INITIAL  5 Patient will score at least 58% function on FOTO to signify clinically meaningful improvement in functional abilities.  01/23/22 INITIAL    PLAN: PT FREQUENCY: 2x/week   PT DURATION: 12 weeks   PLANNED INTERVENTIONS: Therapeutic exercises, Therapeutic activity, Neuro Muscular re-education, Patient/Family education, Joint mobilization, Aquatic Therapy, Dry Needling, Cryotherapy, Moist heat, Vasopneumatic device, Ultrasound, and Manual therapy   PLAN FOR NEXT SESSION: review HEP, progress per post-op protocol     Gwendolyn Grant, PT, DPT, ATC 10/30/21 10:41 AM

## 2021-10-30 ENCOUNTER — Other Ambulatory Visit: Payer: Self-pay

## 2021-10-30 ENCOUNTER — Ambulatory Visit: Payer: Medicare PPO

## 2021-10-30 DIAGNOSIS — M25512 Pain in left shoulder: Secondary | ICD-10-CM

## 2021-10-30 DIAGNOSIS — M6281 Muscle weakness (generalized): Secondary | ICD-10-CM

## 2021-11-03 ENCOUNTER — Other Ambulatory Visit: Payer: Self-pay | Admitting: Neurology

## 2021-11-04 ENCOUNTER — Ambulatory Visit: Payer: Medicare PPO

## 2021-11-04 ENCOUNTER — Other Ambulatory Visit: Payer: Self-pay

## 2021-11-04 DIAGNOSIS — M6281 Muscle weakness (generalized): Secondary | ICD-10-CM

## 2021-11-04 DIAGNOSIS — M25512 Pain in left shoulder: Secondary | ICD-10-CM | POA: Diagnosis not present

## 2021-11-04 NOTE — Therapy (Signed)
OUTPATIENT PHYSICAL THERAPY TREATMENT NOTE   Patient Name: Stephen Chase MRN: [id number] DOB:[birth year], 56 y.o., male Today's Date: 11/04/2021  PCP: [name], FNP REFERRING PROVIDER: [name], FNP   PT End of Session - 11/04/21 0957     Visit Number 3    Number of Visits 25    Date for PT Re-Evaluation 01/24/22    Authorization Type [employer] MCR-    Authorization Time Period 2/8-3/24    Authorization - Visit Number 2    Authorization - Number of Visits 12    Progress Note Due on Visit 10    PT Start Time 1000    Activity Tolerance Patient limited by pain    Behavior During Therapy [facility] for tasks assessed/performed              Past Medical History:  Diagnosis Date   Allergy    Arthritis    SHOULDERS   Balanitis    Deafness, sensorineural, childhood onset    BILATERAL SINCE AGE 60  SECONDARY TO UNKNOWN ILLNESS   GERD (gastroesophageal reflux disease)    Hyperlipidemia    Hypertension    No blood products    Patient is Jehovah Witness no blood products.   OSA on CPAP    SEVERE PER STUDY 03-09-2009   Sleep apnea    uses a cpap   Smokers' cough ([place])    Type 2 diabetes mellitus ([place])    Past Surgical History:  Procedure Laterality Date   CIRCUMCISION N/A 12/19/2013   Procedure: CIRCUMCISION ADULT;  Surgeon: Lowella Bandy, MD;  Location: [facility];  Service: Urology;  Laterality: N/A;   SHOULDER SURGERY Right    WRIST SURGERY Left    Patient Active Problem List   Diagnosis Date Noted   Hypersomnia, persistent 05/09/2018   Type 2 diabetes mellitus with diabetic neuropathy, without long-term current use of insulin ([place]) 05/09/2018   Uncontrolled type 2 diabetes mellitus with hyperglycemia ([place]) 05/09/2018   OSA on CPAP 05/09/2018   Esophageal varices without bleeding ([place]) 05/09/2018   Bilateral deafness 05/09/2018    REFERRING DIAG:  s/p left shoulder RCR ,SAD   THERAPY DIAG:  Acute pain of left shoulder  Muscle weakness  (generalized)  PERTINENT HISTORY: Bilateral deafness   PRECAUTIONS: shoulder  Interpreter utilized SUBJECTIVE: I'm improving but still having some pain in the shoulder. I'm doing good with my exercises, not all the time but here and there.    PAIN:  Are you having pain? Yes NPRS scale: 4/10 Pain location: Lt shoulder  PAIN TYPE: unable to describe  Pain description: intermittent  Aggravating factors: movement Relieving factors: rest    OBJECTIVE:   *Unless otherwise noted all objective measures were captured on initial evaluation.       PATIENT SURVEYS:  FOTO 12% function   COGNITION:          Overall cognitive status: Within functional limits for tasks assessed                               SENSATION:          Light touch: not assessed      POSTURE: Forward head, rounded shoulders    PALPATION: Diffuse tenderness about Lt shoulder    UPPER EXTREMITY AROM/PROM: Rt shoulder AROM WNL with exception of functional IR is moderately limited.    A/PROM Right 10/28/2021 Left PROM 10/28/2021 10/30/21 Left PROM 11/04/21 Left PROM  Shoulder flexion   65    Shoulder extension        Shoulder abduction   69 85 86  Shoulder adduction        Shoulder internal rotation        Shoulder external rotation   8    Elbow flexion        Elbow extension        Wrist flexion        Wrist extension        Wrist ulnar deviation        Wrist radial deviation        Wrist pronation        Wrist supination        (Blank rows = not tested)   UPPER EXTREMITY MMT: Lt shoulder/elbow MMT deferred secondary to post-op acuity    MMT Right 10/28/2021 Left 10/28/2021  Shoulder flexion      Shoulder extension      Shoulder abduction      Shoulder adduction      Shoulder internal rotation      Shoulder external rotation      Middle trapezius      Lower trapezius      Elbow flexion      Elbow extension      Wrist flexion      Wrist extension      Wrist ulnar deviation      Wrist radial  deviation      Wrist pronation      Wrist supination      Grip strength (lbs)      (Blank rows = not tested)                    TODAY'S TREATMENT:  [facility] Adult PT Treatment:                                                DATE: 11/04/21 Therapeutic Exercise: Shoulder pendulum 3 x 30 sec Putty squeezes L 3 x 30 sec Reviewed HEP Manual Therapy: Lt shoulder, elbow, and wrist PROM to tolerance in all planes  Modalities: Ice pack to Lt shoulder x 10 minutes  Self Care: Use of ice to assist in pain reduction/soreness   [facility] Adult PT Treatment:                                                DATE: 10/30/21 Therapeutic Exercise: Shoulder pendulum 3 x 30 sec Reviewed HEP Manual Therapy: Lt shoulder, elbow, and wrist PROM to tolerance in all planes  Modalities: Ice pack to Lt shoulder x 10 minutes  Self Care: Use of ice to assist in pain reduction/soreness         PATIENT EDUCATION: Education details: see treatment  Person educated: Patient Education method: Explanation Education comprehension: verbalized understanding     HOME EXERCISE PROGRAM: Access Code: [email or web address] URL: [email or web address] Date: 10/28/2021 Prepared by: Gwendolyn Grant   Exercises Flexion-Extension Shoulder Pendulum with Table Support - 3 x daily - 7 x weekly - 1 sets - 60 sec hold Horizontal Shoulder Pendulum with Table Support - 3 x daily - 7 x weekly - 1 sets - 60 sec hold Putty Squeezes -  2 x daily - 7 x weekly - 2 sets - 10 reps Wrist Flexion Extension AROM with Fingers Curled and Palm Down - 2 x daily - 7 x weekly - 2 sets - 10 reps     ASSESSMENT:   CLINICAL IMPRESSION: Patient presents to PT guarded with shoulder PROM especially with ER. Patient becomes more relaxed with repetitive PROM over time. Abduction slightly improved from last session, noted facial grimacing at end range. He reports compliance with HEP and with utilizing ice at home when his shoulder is sore. Patient  encouraged to continue with HEP and utilizing ice as needed. Patient continues to benefit from skilled PT services and should be progressed as able to improve functional independence.    REHAB POTENTIAL: Good   CLINICAL DECISION MAKING: Stable/uncomplicated   EVALUATION COMPLEXITY: Low     GOALS: Goals reviewed with patient? No   SHORT TERM GOALS:   STG Name Target Date Goal status  1 Patient will be independent and compliant with initial HEP.    Baseline:  11/18/2021 INITIAL  2 Patient will demonstrate WNL PROM of the Lt shoulder in order to progress towards next phase of rehab.  Baseline:  12/09/2021 INITIAL    LONG TERM GOALS:    LTG Name Target Date Goal status  1 Patient will demonstrate at least 160 degrees of Lt shoulder flexion and abduction AROM to improve ability to reach overhead Baseline: 01/23/22 INITIAL  2 Patient will demonstrate at least 50 degrees of Lt shoulder IR/ER AROM to improve ability to complete self-care activities.  Baseline: 01/23/22 INITIAL  3 Patient will demonstrate at least 4/5 Lt shoulder strength to improve ability to lift items.  Baseline: 01/23/22 INITIAL  4 Patient will demonstrate 5/5 Lt elbow strength to improve ability to carry objects.  Baseline: 01/23/22 INITIAL  5 Patient will score at least 58% function on FOTO to signify clinically meaningful improvement in functional abilities.  01/23/22 INITIAL    PLAN: PT FREQUENCY: 2x/week   PT DURATION: 12 weeks   PLANNED INTERVENTIONS: Therapeutic exercises, Therapeutic activity, Neuro Muscular re-education, Patient/Family education, Joint mobilization, Aquatic Therapy, Dry Needling, Cryotherapy, Moist heat, Vasopneumatic device, Ultrasound, and Manual therapy   PLAN FOR NEXT SESSION:  review HEP, progress per post-op protocol     [name], PTA 11/04/21 9:59 AM

## 2021-11-05 NOTE — Therapy (Signed)
OUTPATIENT PHYSICAL THERAPY TREATMENT NOTE   Patient Name: Stephen Chase MRN: [id number] DOB:1966/05/20, 56 y.o., male Today's Date: 11/06/2021  PCP: [name], FNP REFERRING PROVIDER: Tania Ade, MD   PT End of Session - 11/06/21 1013     Visit Number 4    Number of Visits 25    Date for PT Re-Evaluation 01/24/22    Authorization Type [employer] MCR-    Authorization Time Period 2/8-3/24    Authorization - Visit Number 3    Authorization - Number of Visits 12    Progress Note Due on Visit 10    PT Start Time 9030    PT Stop Time 1104   ice 10 minutes   PT Time Calculation (min) 49 min    Activity Tolerance Patient tolerated treatment well    Behavior During Therapy WFL for tasks assessed/performed               Past Medical History:  Diagnosis Date   Allergy    Arthritis    SHOULDERS   Balanitis    Deafness, sensorineural, childhood onset    BILATERAL SINCE AGE 49  SECONDARY TO UNKNOWN ILLNESS   GERD (gastroesophageal reflux disease)    Hyperlipidemia    Hypertension    No blood products    Patient is Jehovah Witness no blood products.   OSA on CPAP    SEVERE PER STUDY 03-09-2009   Sleep apnea    uses a cpap   Smokers' cough ([place])    Type 2 diabetes mellitus ([place])    Past Surgical History:  Procedure Laterality Date   CIRCUMCISION N/A 12/19/2013   Procedure: CIRCUMCISION ADULT;  Surgeon: Lowella Bandy, MD;  Location: [facility];  Service: Urology;  Laterality: N/A;   SHOULDER SURGERY Right    WRIST SURGERY Left    Patient Active Problem List   Diagnosis Date Noted   Hypersomnia, persistent 05/09/2018   Type 2 diabetes mellitus with diabetic neuropathy, without long-term current use of insulin ([place]) 05/09/2018   Uncontrolled type 2 diabetes mellitus with hyperglycemia ([place]) 05/09/2018   OSA on CPAP 05/09/2018   Esophageal varices without bleeding ([place]) 05/09/2018   Bilateral deafness 05/09/2018    REFERRING DIAG:  s/p left shoulder  RCR ,SAD   THERAPY DIAG:  Acute pain of left shoulder  Muscle weakness (generalized)  PERTINENT HISTORY: Bilateral deafness   PRECAUTIONS: shoulder  Interpreter utilized SUBJECTIVE: Patient reports he is feeling fine today.     PAIN:  Are you having pain? Yes NPRS scale: 5/10 Pain location: Lt shoulder  PAIN TYPE: sore Pain description: intermittent  Aggravating factors: movement Relieving factors: rest    OBJECTIVE:   *Unless otherwise noted all objective measures were captured on initial evaluation.       PATIENT SURVEYS:  FOTO 12% function   COGNITION:          Overall cognitive status: Within functional limits for tasks assessed                               SENSATION:          Light touch: not assessed      POSTURE: Forward head, rounded shoulders    PALPATION: Diffuse tenderness about Lt shoulder    UPPER EXTREMITY AROM/PROM: Rt shoulder AROM WNL with exception of functional IR is moderately limited.    A/PROM Right 10/28/2021 Left PROM 10/28/2021 10/30/21 Left PROM 11/04/21 Left  PROM 11/06/21 Left PROM  Shoulder flexion   65   120  Shoulder extension         Shoulder abduction   69 85 86 100  Shoulder adduction         Shoulder internal rotation         Shoulder external rotation   8   20  Elbow flexion         Elbow extension         Wrist flexion         Wrist extension         Wrist ulnar deviation         Wrist radial deviation         Wrist pronation         Wrist supination         (Blank rows = not tested)   UPPER EXTREMITY MMT: Lt shoulder/elbow MMT deferred secondary to post-op acuity    MMT Right 10/28/2021 Left 10/28/2021  Shoulder flexion      Shoulder extension      Shoulder abduction      Shoulder adduction      Shoulder internal rotation      Shoulder external rotation      Middle trapezius      Lower trapezius      Elbow flexion      Elbow extension      Wrist flexion      Wrist extension      Wrist ulnar deviation       Wrist radial deviation      Wrist pronation      Wrist supination      Grip strength (lbs)      (Blank rows = not tested)                    TODAY'S TREATMENT:  [facility] Adult PT Treatment:                                                DATE: 11/06/21  Manual Therapy: Lt shoulder, elbow, and wrist PROM to tolerance in all planes Gentle grade II GHJ mobilizations for pain reduction STM to Lt deltoid and bicep    [facility] Adult PT Treatment:                                                DATE: 11/04/21 Therapeutic Exercise: Shoulder pendulum 3 x 30 sec Putty squeezes L 3 x 30 sec Reviewed HEP Manual Therapy: Lt shoulder, elbow, and wrist PROM to tolerance in all planes  Modalities: Ice pack to Lt shoulder x 10 minutes  Self Care: Use of ice to assist in pain reduction/soreness   [facility] Adult PT Treatment:                                                DATE: 10/30/21 Therapeutic Exercise: Shoulder pendulum 3 x 30 sec Reviewed HEP Manual Therapy: Lt shoulder, elbow, and wrist PROM to tolerance in all planes  Modalities: Ice pack to Lt shoulder x 10  minutes  Self Care: Use of ice to assist in pain reduction/soreness         PATIENT EDUCATION: Education details: see treatment  Person educated: Patient Education method: Explanation Education comprehension: verbalized understanding     HOME EXERCISE PROGRAM: Access Code: [email or web address] URL: [email or web address] Date: 10/28/2021 Prepared by: Gwendolyn Grant   Exercises Flexion-Extension Shoulder Pendulum with Table Support - 3 x daily - 7 x weekly - 1 sets - 60 sec hold Horizontal Shoulder Pendulum with Table Support - 3 x daily - 7 x weekly - 1 sets - 60 sec hold Putty Squeezes - 2 x daily - 7 x weekly - 2 sets - 10 reps Wrist Flexion Extension AROM with Fingers Curled and Palm Down - 2 x daily - 7 x weekly - 2 sets - 10 reps     ASSESSMENT:   CLINICAL IMPRESSION: Overall good tolerance to manual therapy  today with patient making steady progress in Lt shoulder PROM. ER PROM remains the most limited, though has improved compared to baseline. Minimal guarding with PROM noted today.     REHAB POTENTIAL: Good   CLINICAL DECISION MAKING: Stable/uncomplicated   EVALUATION COMPLEXITY: Low     GOALS: Goals reviewed with patient? No   SHORT TERM GOALS:   STG Name Target Date Goal status  1 Patient will be independent and compliant with initial HEP.    Baseline:  11/18/2021 INITIAL  2 Patient will demonstrate WNL PROM of the Lt shoulder in order to progress towards next phase of rehab.  Baseline:  12/09/2021 INITIAL    LONG TERM GOALS:    LTG Name Target Date Goal status  1 Patient will demonstrate at least 160 degrees of Lt shoulder flexion and abduction AROM to improve ability to reach overhead Baseline: 01/23/22 INITIAL  2 Patient will demonstrate at least 50 degrees of Lt shoulder IR/ER AROM to improve ability to complete self-care activities.  Baseline: 01/23/22 INITIAL  3 Patient will demonstrate at least 4/5 Lt shoulder strength to improve ability to lift items.  Baseline: 01/23/22 INITIAL  4 Patient will demonstrate 5/5 Lt elbow strength to improve ability to carry objects.  Baseline: 01/23/22 INITIAL  5 Patient will score at least 58% function on FOTO to signify clinically meaningful improvement in functional abilities.  01/23/22 INITIAL    PLAN: PT FREQUENCY: 2x/week   PT DURATION: 12 weeks   PLANNED INTERVENTIONS: Therapeutic exercises, Therapeutic activity, Neuro Muscular re-education, Patient/Family education, Joint mobilization, Aquatic Therapy, Dry Needling, Cryotherapy, Moist heat, Vasopneumatic device, Ultrasound, and Manual therapy   PLAN FOR NEXT SESSION:  review HEP, progress per post-op protocol    Gwendolyn Grant, PT, DPT, ATC 11/06/21 10:57 AM

## 2021-11-05 NOTE — Telephone Encounter (Signed)
Received refill request for modafinil.  Last OV was on 10/09/21.  Next OV is scheduled for 10/12/22 .  Last RX was written on 10/08/21 for 30 tabs.   [facility] has been reviewed.

## 2021-11-06 ENCOUNTER — Other Ambulatory Visit: Payer: Self-pay

## 2021-11-06 ENCOUNTER — Ambulatory Visit: Payer: Medicare PPO

## 2021-11-06 DIAGNOSIS — M6281 Muscle weakness (generalized): Secondary | ICD-10-CM | POA: Diagnosis not present

## 2021-11-06 DIAGNOSIS — M25512 Pain in left shoulder: Secondary | ICD-10-CM | POA: Diagnosis not present

## 2021-11-10 NOTE — Therapy (Signed)
OUTPATIENT PHYSICAL THERAPY TREATMENT NOTE   Patient Name: Stephen Chase MRN: [id number] DOB:1965-11-23, 55 y.o., male Today's Date: 11/11/2021  PCP: [name], FNP REFERRING PROVIDER: [name], FNP   PT End of Session - 11/11/21 0930     Visit Number 5    Number of Visits 25    Date for PT Re-Evaluation 01/24/22    Authorization Type [employer] MCR-    Authorization Time Period 2/8-3/24    Authorization - Visit Number 3    Authorization - Number of Visits 12    Progress Note Due on Visit 10    PT Start Time 0930    PT Stop Time 1018   ice pack 10 minutes   PT Time Calculation (min) 48 min    Activity Tolerance Patient tolerated treatment well    Behavior During Therapy WFL for tasks assessed/performed                Past Medical History:  Diagnosis Date   Allergy    Arthritis    SHOULDERS   Balanitis    Deafness, sensorineural, childhood onset    BILATERAL SINCE AGE 34  SECONDARY TO UNKNOWN ILLNESS   GERD (gastroesophageal reflux disease)    Hyperlipidemia    Hypertension    No blood products    Patient is Jehovah Witness no blood products.   OSA on CPAP    SEVERE PER STUDY 03-09-2009   Sleep apnea    uses a cpap   Smokers' cough ([place])    Type 2 diabetes mellitus ([place])    Past Surgical History:  Procedure Laterality Date   CIRCUMCISION N/A 12/19/2013   Procedure: CIRCUMCISION ADULT;  Surgeon: Lowella Bandy, MD;  Location: [facility];  Service: Urology;  Laterality: N/A;   SHOULDER SURGERY Right    WRIST SURGERY Left    Patient Active Problem List   Diagnosis Date Noted   Hypersomnia, persistent 05/09/2018   Type 2 diabetes mellitus with diabetic neuropathy, without long-term current use of insulin ([place]) 05/09/2018   Uncontrolled type 2 diabetes mellitus with hyperglycemia ([place]) 05/09/2018   OSA on CPAP 05/09/2018   Esophageal varices without bleeding ([place]) 05/09/2018   Bilateral deafness 05/09/2018    REFERRING DIAG:  s/p left  shoulder RCR ,SAD   THERAPY DIAG:  Acute pain of left shoulder  Muscle weakness (generalized)  PERTINENT HISTORY: Bilateral deafness   PRECAUTIONS: shoulder  Interpreter utilized SUBJECTIVE:  Patient reports the pain is improving, but can still feel sharp pain occasionally.   PAIN:  Are you having pain? Yes NPRS scale: 4/10 Pain location: Lt shoulder  PAIN TYPE: sore, sharp Pain description: intermittent  Aggravating factors: movement Relieving factors: rest    OBJECTIVE:   *Unless otherwise noted all objective measures were captured on initial evaluation.       PATIENT SURVEYS:  FOTO 12% function   COGNITION:          Overall cognitive status: Within functional limits for tasks assessed                               SENSATION:          Light touch: not assessed      POSTURE: Forward head, rounded shoulders    PALPATION: Diffuse tenderness about Lt shoulder    UPPER EXTREMITY AROM/PROM: Rt shoulder AROM WNL with exception of functional IR is moderately limited.    A/PROM Right 10/28/2021  Left PROM 10/28/2021 10/30/21 Left PROM 11/04/21 Left PROM 11/06/21 Left PROM 11/11/21 Left PROM  Shoulder flexion   65   120 133  Shoulder extension          Shoulder abduction   69 85 86 100 105  Shoulder adduction          Shoulder internal rotation          Shoulder external rotation   8   20   Elbow flexion          Elbow extension          Wrist flexion          Wrist extension          Wrist ulnar deviation          Wrist radial deviation          Wrist pronation          Wrist supination          (Blank rows = not tested)   UPPER EXTREMITY MMT: Lt shoulder/elbow MMT deferred secondary to post-op acuity    MMT Right 10/28/2021 Left 10/28/2021  Shoulder flexion      Shoulder extension      Shoulder abduction      Shoulder adduction      Shoulder internal rotation      Shoulder external rotation      Middle trapezius      Lower trapezius      Elbow flexion       Elbow extension      Wrist flexion      Wrist extension      Wrist ulnar deviation      Wrist radial deviation      Wrist pronation      Wrist supination      Grip strength (lbs)      (Blank rows = not tested)                    TODAY'S TREATMENT:  [facility] Adult PT Treatment:                                                DATE: 11/11/21  Manual Therapy: Lt shoulder, elbow, and wrist PROM to tolerance in all planes Gentle grade II GHJ mobilizations for pain reduction Modalities: Ice pack to Lt shoulder x 10 minutes    [facility] Adult PT Treatment:                                                DATE: 11/06/21  Manual Therapy: Lt shoulder, elbow, and wrist PROM to tolerance in all planes Gentle grade II GHJ mobilizations for pain reduction STM to Lt deltoid and bicep    [facility] Adult PT Treatment:                                                DATE: 11/04/21 Therapeutic Exercise: Shoulder pendulum 3 x 30 sec Putty squeezes L 3 x 30 sec Reviewed HEP Manual Therapy: Lt shoulder, elbow, and wrist PROM to  tolerance in all planes  Modalities: Ice pack to Lt shoulder x 10 minutes  Self Care: Use of ice to assist in pain reduction/soreness           PATIENT EDUCATION: Education details: N/A Person educated: n/a Education method: n/a Education comprehension: n/a     HOME EXERCISE PROGRAM: Access Code: [email or web address] URL: [email or web address] Date: 10/28/2021 Prepared by: Gwendolyn Grant   Exercises Flexion-Extension Shoulder Pendulum with Table Support - 3 x daily - 7 x weekly - 1 sets - 60 sec hold Horizontal Shoulder Pendulum with Table Support - 3 x daily - 7 x weekly - 1 sets - 60 sec hold Putty Squeezes - 2 x daily - 7 x weekly - 2 sets - 10 reps Wrist Flexion Extension AROM with Fingers Curled and Palm Down - 2 x daily - 7 x weekly - 2 sets - 10 reps     ASSESSMENT:   CLINICAL IMPRESSION: Continued with PROM today with patient initially guarded with Lt  shoulder PROM in all planes. He continues to make gradual improvements in PROM with abduction and ER remaining the most limited at this time secondary to end range pain.      REHAB POTENTIAL: Good   CLINICAL DECISION MAKING: Stable/uncomplicated   EVALUATION COMPLEXITY: Low     GOALS: Goals reviewed with patient? No   SHORT TERM GOALS:   STG Name Target Date Goal status  1 Patient will be independent and compliant with initial HEP.    Baseline:  11/18/2021 Achieved   2 Patient will demonstrate WNL PROM of the Lt shoulder in order to progress towards next phase of rehab.  Baseline:  12/09/2021 Ongoing     LONG TERM GOALS:    LTG Name Target Date Goal status  1 Patient will demonstrate at least 160 degrees of Lt shoulder flexion and abduction AROM to improve ability to reach overhead Baseline: 01/23/22 INITIAL  2 Patient will demonstrate at least 50 degrees of Lt shoulder IR/ER AROM to improve ability to complete self-care activities.  Baseline: 01/23/22 INITIAL  3 Patient will demonstrate at least 4/5 Lt shoulder strength to improve ability to lift items.  Baseline: 01/23/22 INITIAL  4 Patient will demonstrate 5/5 Lt elbow strength to improve ability to carry objects.  Baseline: 01/23/22 INITIAL  5 Patient will score at least 58% function on FOTO to signify clinically meaningful improvement in functional abilities.  01/23/22 INITIAL    PLAN: PT FREQUENCY: 2x/week   PT DURATION: 12 weeks   PLANNED INTERVENTIONS: Therapeutic exercises, Therapeutic activity, Neuro Muscular re-education, Patient/Family education, Joint mobilization, Aquatic Therapy, Dry Needling, Cryotherapy, Moist heat, Vasopneumatic device, Ultrasound, and Manual therapy   PLAN FOR NEXT SESSION:  FOTO, progress per post-op protocol    Gwendolyn Grant, PT, DPT, ATC 11/11/21 10:56 AM

## 2021-11-11 ENCOUNTER — Other Ambulatory Visit: Payer: Self-pay

## 2021-11-11 ENCOUNTER — Telehealth: Payer: Self-pay

## 2021-11-11 ENCOUNTER — Ambulatory Visit: Payer: Medicare PPO

## 2021-11-11 DIAGNOSIS — M25512 Pain in left shoulder: Secondary | ICD-10-CM | POA: Diagnosis not present

## 2021-11-11 DIAGNOSIS — M6281 Muscle weakness (generalized): Secondary | ICD-10-CM

## 2021-11-11 NOTE — Chronic Care Management (AMB) (Addendum)
Chronic Care Management Pharmacy Assistant   Name: Nekhi Liwanag  MRN: [id number] DOB: 1966-07-05  Reason for Encounter: Disease State/ Diabetes  Recent office visits:  08-11-2021 [name], FNP. Referral placed to orthopedic surgery. Glucose= 236. AST= 42. A1C= 7.8. Trig= 433, HDL= 24, VLDL= 69, Chol/HDL= 7.1. Tdap and shingrix ordered. Flu vaccine and pneumococcal given.  Recent consult visits:  11-11-2021 Derald Macleod, PT (Rehab). Therapy for pain in left shoulder.  11-06-2021 [place], Roxy Cedar, PT (Rehab). Therapy for pain in left shoulder.  11-04-2021 Harland German, PTA (Rehab). Therapy for pain in left shoulder.  10-30-2021 [place], Roxy Cedar, PT (Rehab). Therapy for pain in left shoulder.  10-28-2021 [place], Roxy Cedar, PT (Rehab). Therapy for pain in left shoulder.  10-09-2021 Butch Penny, NP (Neurology). Follow up on CPAP.  Hospital visits:  Medication Reconciliation was completed by comparing discharge summary, patients EMR and Pharmacy list, and upon discussion with patient.   On 08/07/2021 due to Fall was seen at  [facility].     New?Medications Started Naproxen 375 mg Oral 2 times daily with meals, TiZANidine HCl 4 mg Oral Daily at bedtime   Medications that remain the same after Hospital Discharge:??  -All other medications will remain the same.     Medications: Outpatient Encounter Medications as of 11/11/2021  Medication Sig   Accu-Chek Softclix Lancets lancets Use as instructed to test blood sugars 3 times a day dx code e11.65   aspirin EC 81 MG tablet Take 81 mg by mouth daily.   atorvastatin (LIPITOR) 40 MG tablet TAKE 1 TABLET(40 MG) BY MOUTH AT BEDTIME   Blood Glucose Monitoring Suppl (ACCU-CHEK GUIDE) w/Device KIT Use to check blood sugars three times daily E11.9   carvedilol (COREG) 6.25 MG tablet TAKE 1 TABLET(6.25 MG) BY MOUTH TWICE DAILY WITH A MEAL   cholecalciferol (VITAMIN D3) 25 MCG (1000 UNIT) tablet Take  1,000 Units by mouth daily.   Cyanocobalamin (B-12 PO) Take by mouth daily.   cyclobenzaprine (FLEXERIL) 10 MG tablet Take 1 tablet (10 mg total) by mouth 3 (three) times daily as needed for muscle spasms.   fenofibrate (TRICOR) 145 MG tablet TAKE 1 TABLET(145 MG) BY MOUTH DAILY   glucose blood (ACCU-CHEK GUIDE) test strip CHECK BLOOD SUGAR 3 TIMES A DAY BEFORE BREAKFAST, LUNCH, AND DINNER   glucose blood test strip Use as instructed to test blood sugars 3 times a day dx code e11.65   Icosapent Ethyl (VASCEPA) 1 g CAPS One bid for elevated triglycerides   Insulin Pen Needle (PEN NEEDLES) 32G X 4 MM MISC Inject 1 each into the skin daily.   Magnesium 250 MG TABS Take 1 tablet (250 mg total) by mouth every evening. With evening meals   modafinil (PROVIGIL) 200 MG tablet TAKE 1 TABLET(200 MG) BY MOUTH DAILY   Multiple Vitamins-Minerals (MULTIVITAMIN WITH MINERALS) tablet Take 1 tablet by mouth daily.   naproxen (NAPROSYN) 375 MG tablet Take 1 tablet (375 mg total) by mouth 2 (two) times daily with a meal.   niacin (NIASPAN) 500 MG CR tablet Take 2,000 mg by mouth at bedtime.    olmesartan-hydrochlorothiazide (BENICAR HCT) 40-25 MG tablet Take 1 tablet by mouth daily.   Omega-3 Fatty Acids (FISH OIL) 1000 MG CPDR Take by mouth. Take one tablet daily   omeprazole (PRILOSEC) 10 MG capsule TAKE 1 CAPSULE(10 MG) BY MOUTH DAILY   OVER THE COUNTER MEDICATION Vitamin D 3 One capsule daily.   Semaglutide, 2  MG/DOSE, (OZEMPIC, 2 MG/DOSE,) 8 MG/3ML SOPN Inject 2 mg into the skin once a week.   tamsulosin (FLOMAX) 0.4 MG CAPS capsule TAKE 1 CAPSULE BY MOUTH EVERY DAY 30 MINUTES AFTER THE SAME MEAL   tiZANidine (ZANAFLEX) 4 MG tablet Take 1 tablet (4 mg total) by mouth at bedtime.   UNABLE TO FIND CPAP for OSA (6cm-19cm)   No facility-administered encounter medications on file as of 11/11/2021.  Recent Relevant Labs: Lab Results  Component Value Date/Time   HGBA1C 7.8 (H) 08/11/2021 09:35 AM   HGBA1C 8.4  (H) 04/17/2021 10:25 AM   MICROALBUR 10 04/17/2021 11:13 AM   MICROALBUR 80 04/10/2020 11:49 AM    Kidney Function Lab Results  Component Value Date/Time   CREATININE 1.12 08/11/2021 09:35 AM   CREATININE 1.64 (H) 04/17/2021 10:25 AM   GFRNONAA 72 10/17/2020 02:37 PM   GFRAA 84 10/17/2020 02:37 PM  Recent Relevant Labs: Lab Results  Component Value Date/Time   HGBA1C 7.8 (H) 08/11/2021 09:35 AM   HGBA1C 8.4 (H) 04/17/2021 10:25 AM   MICROALBUR 10 04/17/2021 11:13 AM   MICROALBUR 80 04/10/2020 11:49 AM    Kidney Function Lab Results  Component Value Date/Time   CREATININE 1.12 08/11/2021 09:35 AM   CREATININE 1.64 (H) 04/17/2021 10:25 AM   GFRNONAA 72 10/17/2020 02:37 PM   GFRAA 84 10/17/2020 02:37 PM    Current antihyperglycemic regimen:  Ozempic 0.5 mg  What recent interventions/DTPs have been made to improve glycemic control:  -Educated on A1c and blood sugar goals; Complications of diabetes including kidney damage, retinal damage, and cardiovascular disease; Exercise goal of 150 minutes per week; Benefits of weight loss; Benefits of routine self-monitoring of blood sugar  Have there been any recent hospitalizations or ED visits since last visit with CPP? No Patient denies hypoglycemic symptoms  Patient denies hyperglycemic symptoms  How often are you checking your blood sugar? once daily  What are your blood sugars ranging?  Fasting: 240, 300  Before meals: None After meals: None Bedtime: None  During the week, how often does your blood glucose drop below 70? Never  Are you checking your feet daily/regularly? Patient stated daily.  Adherence Review: Is the patient currently on a STATIN medication? Yes Is the patient currently on ACE/ARB medication? Yes Does the patient have >5 day gap between last estimated fill dates? Yes  NOTES: Patient stated readings have been elevated lately and feels taking Ozempic 0.5 mg instead of 2 mg is causing the problem.  Patinet has been trying to eat better but his fasting reading 300 on 11-17-2021 was due to attending a gathering with unhealthy food.Patient hasn't received his 2 mg Ozempic injection. Contacted [employer] and the automated system stated medication is still in process and to call back in a few days for the tracking number. Requested a call back from [employer] to request a free voucher. Will update [name] FNP, [name] CPP and Tianna CMA. Instructed patient to go to the ER if sugars are really elevated with symptoms. Will update patient once [employer] calls back.  Care Gaps: Tdap overdue Shingrix overdue Yearly foot exam overdue AWV 04-23-2022  Star Rating Drugs: Ozempic 2 mg- Patient assistance Olmesartan/HCTZ 40-25 mg- Last filled 10-13-2021 90 DS [facility] Atorvastatin 40 mg- Last filled 09-24-2021 [facility]  [facility] Clinical Pharmacist Assistant [phone]

## 2021-11-13 ENCOUNTER — Ambulatory Visit: Payer: Medicare PPO

## 2021-11-13 ENCOUNTER — Other Ambulatory Visit: Payer: Self-pay

## 2021-11-13 MED ORDER — GLUCOSE BLOOD VI STRP: ORAL_STRIP | 12 refills | Status: DC

## 2021-11-18 ENCOUNTER — Other Ambulatory Visit: Payer: Self-pay

## 2021-11-18 ENCOUNTER — Ambulatory Visit: Payer: Medicare PPO

## 2021-11-18 DIAGNOSIS — M25512 Pain in left shoulder: Secondary | ICD-10-CM

## 2021-11-18 DIAGNOSIS — M6281 Muscle weakness (generalized): Secondary | ICD-10-CM

## 2021-11-18 NOTE — Therapy (Signed)
OUTPATIENT PHYSICAL THERAPY TREATMENT NOTE   Patient Name: Stephen Chase MRN: [id number] DOB:[birth year], 56 y.o., male Today's Date: 11/18/2021  PCP: [name], FNP REFERRING PROVIDER: [name], FNP   PT End of Session - 11/18/21 [phone]     Visit Number 6    Number of Visits 25    Date for PT Re-Evaluation 01/24/22    Authorization Type [employer] MCR-    Authorization Time Period 2/8-3/24    Authorization - Visit Number 4    Authorization - Number of Visits 12    Progress Note Due on Visit 10    PT Start Time 1000    PT Stop Time 1043    PT Time Calculation (min) 43 min    Activity Tolerance Patient tolerated treatment well    Behavior During Therapy WFL for tasks assessed/performed                 Past Medical History:  Diagnosis Date   Allergy    Arthritis    SHOULDERS   Balanitis    Deafness, sensorineural, childhood onset    BILATERAL SINCE AGE 70  SECONDARY TO UNKNOWN ILLNESS   GERD (gastroesophageal reflux disease)    Hyperlipidemia    Hypertension    No blood products    Patient is Jehovah Witness no blood products.   OSA on CPAP    SEVERE PER STUDY 03-09-2009   Sleep apnea    uses a cpap   Smokers' cough ([place])    Type 2 diabetes mellitus ([place])    Past Surgical History:  Procedure Laterality Date   CIRCUMCISION N/A 12/19/2013   Procedure: CIRCUMCISION ADULT;  Surgeon: Lowella Bandy, MD;  Location: [facility];  Service: Urology;  Laterality: N/A;   SHOULDER SURGERY Right    WRIST SURGERY Left    Patient Active Problem List   Diagnosis Date Noted   Hypersomnia, persistent 05/09/2018   Type 2 diabetes mellitus with diabetic neuropathy, without long-term current use of insulin ([place]) 05/09/2018   Uncontrolled type 2 diabetes mellitus with hyperglycemia ([place]) 05/09/2018   OSA on CPAP 05/09/2018   Esophageal varices without bleeding ([place]) 05/09/2018   Bilateral deafness 05/09/2018    REFERRING DIAG:  s/p left shoulder RCR ,SAD    THERAPY DIAG:  Acute pain of left shoulder  Muscle weakness (generalized)  PERTINENT HISTORY: Bilateral deafness   PRECAUTIONS: shoulder  Interpreter utilized SUBJECTIVE: Patient reports that his pain is improving. It starts and then eases off.  PAIN:  Are you having pain? Yes NPRS scale: 4/10 Pain location: Lt shoulder  PAIN TYPE: sore, sharp Pain description: intermittent  Aggravating factors: movement Relieving factors: rest    OBJECTIVE:   *Unless otherwise noted all objective measures were captured on initial evaluation.       PATIENT SURVEYS:  FOTO 12% function 11/18/2021 53%   COGNITION:          Overall cognitive status: Within functional limits for tasks assessed                               SENSATION:          Light touch: not assessed      POSTURE: Forward head, rounded shoulders    PALPATION: Diffuse tenderness about Lt shoulder    UPPER EXTREMITY AROM/PROM: Rt shoulder AROM WNL with exception of functional IR is moderately limited.    A/PROM Right 10/28/2021 Left PROM 10/28/2021 10/30/21  Left PROM 11/04/21 Left PROM 11/06/21 Left PROM 11/11/21 Left PROM 11/18/2021 Left PROM  Shoulder flexion   65   120 133 128  Shoulder extension           Shoulder abduction   69 85 86 100 105 100  Shoulder adduction           Shoulder internal rotation           Shoulder external rotation   8   20    Elbow flexion           Elbow extension           Wrist flexion           Wrist extension           Wrist ulnar deviation           Wrist radial deviation           Wrist pronation           Wrist supination           (Blank rows = not tested)   UPPER EXTREMITY MMT: Lt shoulder/elbow MMT deferred secondary to post-op acuity    MMT Right 10/28/2021 Left 10/28/2021  Shoulder flexion      Shoulder extension      Shoulder abduction      Shoulder adduction      Shoulder internal rotation      Shoulder external rotation      Middle trapezius      Lower  trapezius      Elbow flexion      Elbow extension      Wrist flexion      Wrist extension      Wrist ulnar deviation      Wrist radial deviation      Wrist pronation      Wrist supination      Grip strength (lbs)      (Blank rows = not tested)                    TODAY'S TREATMENT:  [facility] Adult PT Treatment:                                                DATE: 11/18/21 Manual Therapy: Lt shoulder, elbow, and wrist PROM to tolerance in all planes Gentle grade II GHJ mobilizations for pain reduction Modalities: Ice pack to Lt shoulder x 10 minutes    [facility] Adult PT Treatment:                                                DATE: 11/11/21  Manual Therapy: Lt shoulder, elbow, and wrist PROM to tolerance in all planes Gentle grade II GHJ mobilizations for pain reduction Modalities: Ice pack to Lt shoulder x 10 minutes    [facility] Adult PT Treatment:                                                DATE: 11/06/21  Manual Therapy: Lt shoulder, elbow, and wrist PROM to tolerance  in all planes Gentle grade II GHJ mobilizations for pain reduction STM to Lt deltoid and bicep    [facility] Adult PT Treatment:                                                DATE: 11/04/21 Therapeutic Exercise: Shoulder pendulum 3 x 30 sec Putty squeezes L 3 x 30 sec Reviewed HEP Manual Therapy: Lt shoulder, elbow, and wrist PROM to tolerance in all planes  Modalities: Ice pack to Lt shoulder x 10 minutes  Self Care: Use of ice to assist in pain reduction/soreness       PATIENT EDUCATION: Education details: N/A Person educated: n/a Education method: n/a Education comprehension: n/a     HOME EXERCISE PROGRAM: Access Code: [email or web address] URL: [email or web address] Date: 10/28/2021 Prepared by: Gwendolyn Grant   Exercises Flexion-Extension Shoulder Pendulum with Table Support - 3 x daily - 7 x weekly - 1 sets - 60 sec hold Horizontal Shoulder Pendulum with Table Support - 3 x daily - 7 x  weekly - 1 sets - 60 sec hold Putty Squeezes - 2 x daily - 7 x weekly - 2 sets - 10 reps Wrist Flexion Extension AROM with Fingers Curled and Palm Down - 2 x daily - 7 x weekly - 2 sets - 10 reps     ASSESSMENT:   CLINICAL IMPRESSION: Patient presents to PT with moderate levels of pain and subjectively reports he is feeling better overall. Continued with PROM today and patient was initially guarded but was able to make gradual improvements throughout session. Patient continues to benefit from skilled PT services and should be progressed as able to improve functional independence.     REHAB POTENTIAL: Good   CLINICAL DECISION MAKING: Stable/uncomplicated   EVALUATION COMPLEXITY: Low     GOALS: Goals reviewed with patient? No   SHORT TERM GOALS:   STG Name Target Date Goal status  1 Patient will be independent and compliant with initial HEP.    Baseline:  11/18/2021 Achieved   2 Patient will demonstrate WNL PROM of the Lt shoulder in order to progress towards next phase of rehab.  Baseline:  12/09/2021 Ongoing     LONG TERM GOALS:    LTG Name Target Date Goal status  1 Patient will demonstrate at least 160 degrees of Lt shoulder flexion and abduction AROM to improve ability to reach overhead Baseline: 01/23/22 INITIAL  2 Patient will demonstrate at least 50 degrees of Lt shoulder IR/ER AROM to improve ability to complete self-care activities.  Baseline: 01/23/22 INITIAL  3 Patient will demonstrate at least 4/5 Lt shoulder strength to improve ability to lift items.  Baseline: 01/23/22 INITIAL  4 Patient will demonstrate 5/5 Lt elbow strength to improve ability to carry objects.  Baseline: 01/23/22 INITIAL  5 Patient will score at least 58% function on FOTO to signify clinically meaningful improvement in functional abilities.  11/18/2021 53% 01/23/22 INITIAL    PLAN: PT FREQUENCY: 2x/week   PT DURATION: 12 weeks   PLANNED INTERVENTIONS: Therapeutic exercises, Therapeutic activity,  Neuro Muscular re-education, Patient/Family education, Joint mobilization, Aquatic Therapy, Dry Needling, Cryotherapy, Moist heat, Vasopneumatic device, Ultrasound, and Manual therapy   PLAN FOR NEXT SESSION:  progress per post-op protocol     [name], PTA 11/18/21 10:47 AM

## 2021-11-19 ENCOUNTER — Telehealth: Payer: Self-pay

## 2021-11-19 ENCOUNTER — Other Ambulatory Visit: Payer: Self-pay

## 2021-11-19 DIAGNOSIS — E1140 Type 2 diabetes mellitus with diabetic neuropathy, unspecified: Secondary | ICD-10-CM

## 2021-11-19 MED ORDER — OZEMPIC (2 MG/DOSE) 8 MG/3ML ~~LOC~~ SOPN: 2.0000 mg | PEN_INJECTOR | SUBCUTANEOUS | 1 refills | Status: DC

## 2021-11-19 NOTE — Telephone Encounter (Signed)
?Chronic Care Management  ? ?Follow Up Note ? ? ?11/19/2021 ?Name: Stephen Chase MRN: [id number] DOB: 27-Feb-1966 ? ?Referred by: Stephen Brine, FNP ?Reason for referral : No chief complaint on file. ? ? ?Stephen Chase is a 56 y.o. year old male who is a primary care patient of Stephen Chase, [place]. The CCM team was consulted for assistance with chronic disease management and care coordination needs.   ? ?Review of patient status, including review of consultants reports, relevant laboratory and other test results, and collaboration with appropriate care team members and the patient's provider was performed as part of comprehensive patient evaluation and provision of chronic care management services.   ? ?SDOH (Social Determinants of Health) assessments performed: No ?See Care Plan activities for detailed interventions related to Stephen Chase)  ?$Remo'@SDOHINTERVENTIONS'uikxZ$ @  ? ?Outpatient Encounter Medications as of 11/19/2021  ?Medication Sig  ? Accu-Chek Softclix Lancets lancets Use as instructed to test blood sugars 3 times a day dx code e11.65  ? aspirin EC 81 MG tablet Take 81 mg by mouth daily.  ? atorvastatin (LIPITOR) 40 MG tablet TAKE 1 TABLET(40 MG) BY MOUTH AT BEDTIME  ? Blood Glucose Monitoring Suppl (ACCU-CHEK GUIDE) w/Device KIT Use to check blood sugars three times daily E11.9  ? carvedilol (COREG) 6.25 MG tablet TAKE 1 TABLET(6.25 MG) BY MOUTH TWICE DAILY WITH A MEAL  ? cholecalciferol (VITAMIN D3) 25 MCG (1000 UNIT) tablet Take 1,000 Units by mouth daily.  ? Cyanocobalamin (B-12 PO) Take by mouth daily.  ? cyclobenzaprine (FLEXERIL) 10 MG tablet Take 1 tablet (10 mg total) by mouth 3 (three) times daily as needed for muscle spasms.  ? fenofibrate (TRICOR) 145 MG tablet TAKE 1 TABLET(145 MG) BY MOUTH DAILY  ? glucose blood (ACCU-CHEK GUIDE) test strip CHECK BLOOD SUGAR 3 TIMES A DAY BEFORE BREAKFAST, LUNCH, AND DINNER  ? glucose blood test strip Use as instructed to test blood sugars 3 times a day dx code e11.65  ? Icosapent  Ethyl (VASCEPA) 1 g CAPS One bid for elevated triglycerides  ? Insulin Pen Needle (PEN NEEDLES) 32G X 4 MM MISC Inject 1 each into the skin daily.  ? Magnesium 250 MG TABS Take 1 tablet (250 mg total) by mouth every evening. With evening meals  ? modafinil (PROVIGIL) 200 MG tablet TAKE 1 TABLET(200 MG) BY MOUTH DAILY  ? Multiple Vitamins-Minerals (MULTIVITAMIN WITH MINERALS) tablet Take 1 tablet by mouth daily.  ? naproxen (NAPROSYN) 375 MG tablet Take 1 tablet (375 mg total) by mouth 2 (two) times daily with a meal.  ? niacin (NIASPAN) 500 MG CR tablet Take 2,000 mg by mouth at bedtime.   ? olmesartan-hydrochlorothiazide (BENICAR HCT) 40-25 MG tablet Take 1 tablet by mouth daily.  ? Omega-3 Fatty Acids (FISH OIL) 1000 MG CPDR Take by mouth. Take one tablet daily  ? omeprazole (PRILOSEC) 10 MG capsule TAKE 1 CAPSULE(10 MG) BY MOUTH DAILY  ? OVER THE COUNTER MEDICATION Vitamin D 3 One capsule daily.  ? Semaglutide, 2 MG/DOSE, (OZEMPIC, 2 MG/DOSE,) 8 MG/3ML SOPN Inject 2 mg into the skin once a week.  ? tamsulosin (FLOMAX) 0.4 MG CAPS capsule TAKE 1 CAPSULE BY MOUTH EVERY DAY 30 MINUTES AFTER THE SAME MEAL  ? tiZANidine (ZANAFLEX) 4 MG tablet Take 1 tablet (4 mg total) by mouth at bedtime.  ? UNABLE TO FIND CPAP for OSA (6cm-19cm)  ? ?No facility-administered encounter medications on file as of 11/19/2021.  ?  ? ?Objective:  ?-spoke with Ms. Stephen Chase to make  her aware that we are still waiting for the Ozempic order to come in for Stephen Chase.  ?-We have been supplying the patient with Ozempic samples that only come in 0.5 mg once per week, patients current dose is $Remov'2mg'StxSWw$ .  ?-She reports being concerned about patients BS readings and is open to picking up a refill from the pharmacy for the correct dose of 2 mg.  ?-I gave her the contact information for [facility] and she is going to call them to get a refill for Stephen Chase.  ?-She thanked me for the call and will make [place] aware of any issues that might arise or any questions  they might have.  ? ?Plan:  ? ?Stephen Chase is going to pick up Ozempic 2 mg from [facility] today.  ? ?Stephen Chase, CPP, PharmD ?Clinical Pharmacist Practitioner ?[facility] ?[phone] ? ? ? ?

## 2021-11-19 NOTE — Therapy (Signed)
OUTPATIENT PHYSICAL THERAPY TREATMENT NOTE   Patient Name: Stephen Chase MRN: [id number] DOB:03-27-1966, 56 y.o., male Today's Date: 11/20/2021  PCP: [name], FNP REFERRING PROVIDER: Tania Ade, MD   PT End of Session - 11/20/21 1013     Visit Number 7    Number of Visits 25    Date for PT Re-Evaluation 01/24/22    Authorization Type [employer] MCR-    Authorization Time Period 2/8-3/24    Authorization - Visit Number 6    Authorization - Number of Visits 12    Progress Note Due on Visit 10    PT Start Time 1013    PT Stop Time 1103   ice 10 minutes   PT Time Calculation (min) 50 min    Activity Tolerance Patient tolerated treatment well    Behavior During Therapy WFL for tasks assessed/performed                  Past Medical History:  Diagnosis Date   Allergy    Arthritis    SHOULDERS   Balanitis    Deafness, sensorineural, childhood onset    BILATERAL SINCE AGE 69  SECONDARY TO UNKNOWN ILLNESS   GERD (gastroesophageal reflux disease)    Hyperlipidemia    Hypertension    No blood products    Patient is Jehovah Witness no blood products.   OSA on CPAP    SEVERE PER STUDY 03-09-2009   Sleep apnea    uses a cpap   Smokers' cough ([place])    Type 2 diabetes mellitus ([place])    Past Surgical History:  Procedure Laterality Date   CIRCUMCISION N/A 12/19/2013   Procedure: CIRCUMCISION ADULT;  Surgeon: Lowella Bandy, MD;  Location: [facility];  Service: Urology;  Laterality: N/A;   SHOULDER SURGERY Right    WRIST SURGERY Left    Patient Active Problem List   Diagnosis Date Noted   Hypersomnia, persistent 05/09/2018   Type 2 diabetes mellitus with diabetic neuropathy, without long-term current use of insulin ([place]) 05/09/2018   Uncontrolled type 2 diabetes mellitus with hyperglycemia ([place]) 05/09/2018   OSA on CPAP 05/09/2018   Esophageal varices without bleeding ([place]) 05/09/2018   Bilateral deafness 05/09/2018    REFERRING DIAG:  s/p left  shoulder RCR ,SAD   THERAPY DIAG:  Acute pain of left shoulder  Muscle weakness (generalized)  PERTINENT HISTORY: Bilateral deafness   PRECAUTIONS: shoulder  Interpreter utilized SUBJECTIVE:  Patient reports he is feeling good. The pain is better.  PAIN:  Are you having pain? Yes NPRS scale: 3/10 Pain location: Lt shoulder  PAIN TYPE: sore, sharp Pain description: intermittent  Aggravating factors: movement Relieving factors: rest    OBJECTIVE:   *Unless otherwise noted all objective measures were captured on initial evaluation.       PATIENT SURVEYS:  FOTO 12% function 11/18/2021 53%   COGNITION:          Overall cognitive status: Within functional limits for tasks assessed                               SENSATION:          Light touch: not assessed      POSTURE: Forward head, rounded shoulders    PALPATION: Diffuse tenderness about Lt shoulder    UPPER EXTREMITY AROM/PROM: Rt shoulder AROM WNL with exception of functional IR is moderately limited.    A/PROM Right 10/28/2021 Left  PROM 10/28/2021 10/30/21 Left PROM 11/04/21 Left PROM 11/06/21 Left PROM 11/11/21 Left PROM 11/18/2021 Left PROM 11/20/21  Shoulder flexion   65   120 133 128 135  Shoulder extension            Shoulder abduction   69 85 86 100 105 100 110  Shoulder adduction            Shoulder internal rotation            Shoulder external rotation   8   20   24   Elbow flexion            Elbow extension            Wrist flexion            Wrist extension            Wrist ulnar deviation            Wrist radial deviation            Wrist pronation            Wrist supination            (Blank rows = not tested)   UPPER EXTREMITY MMT: Lt shoulder/elbow MMT deferred secondary to post-op acuity    MMT Right 10/28/2021 Left 10/28/2021  Shoulder flexion      Shoulder extension      Shoulder abduction      Shoulder adduction      Shoulder internal rotation      Shoulder external rotation       Middle trapezius      Lower trapezius      Elbow flexion      Elbow extension      Wrist flexion      Wrist extension      Wrist ulnar deviation      Wrist radial deviation      Wrist pronation      Wrist supination      Grip strength (lbs)      (Blank rows = not tested)                    TODAY'S TREATMENT:  [facility] Adult PT Treatment:                                                DATE: 11/20/21  Manual Therapy: Lt shoulder, elbow, and wrist PROM to tolerance in all planes grade II-III GHJ mobilizations Lt   Modalities: Ice pack to Lt shoulder x 10 minutes     [facility] Adult PT Treatment:                                                DATE: 11/18/21 Manual Therapy: Lt shoulder, elbow, and wrist PROM to tolerance in all planes Gentle grade II GHJ mobilizations for pain reduction Modalities: Ice pack to Lt shoulder x 10 minutes    [facility] Adult PT Treatment:  DATE: 11/11/21  Manual Therapy: Lt shoulder, elbow, and wrist PROM to tolerance in all planes Gentle grade II GHJ mobilizations for pain reduction Modalities: Ice pack to Lt shoulder x 10 minutes    [facility] Adult PT Treatment:                                                DATE: 11/06/21  Manual Therapy: Lt shoulder, elbow, and wrist PROM to tolerance in all planes Gentle grade II GHJ mobilizations for pain reduction STM to Lt deltoid and bicep    [facility] Adult PT Treatment:                                                DATE: 11/04/21 Therapeutic Exercise: Shoulder pendulum 3 x 30 sec Putty squeezes L 3 x 30 sec Reviewed HEP Manual Therapy: Lt shoulder, elbow, and wrist PROM to tolerance in all planes  Modalities: Ice pack to Lt shoulder x 10 minutes  Self Care: Use of ice to assist in pain reduction/soreness       PATIENT EDUCATION: Education details: N/A Person educated: n/a Education method: n/a Education comprehension: n/a     HOME EXERCISE  PROGRAM: Access Code: [email or web address] URL: [email or web address] Date: 10/28/2021 Prepared by: Gwendolyn Grant   Exercises Flexion-Extension Shoulder Pendulum with Table Support - 3 x daily - 7 x weekly - 1 sets - 60 sec hold Horizontal Shoulder Pendulum with Table Support - 3 x daily - 7 x weekly - 1 sets - 60 sec hold Putty Squeezes - 2 x daily - 7 x weekly - 2 sets - 10 reps Wrist Flexion Extension AROM with Fingers Curled and Palm Down - 2 x daily - 7 x weekly - 2 sets - 10 reps     ASSESSMENT:   CLINICAL IMPRESSION: Patient is 5.5 weeks s/p Lt RCR and is making modest progress in Lt shoulder PROM. He remains limited in all planes of motion secondary to pain. At this time it is unclear if patient is developing adhesive capsulitis as he has an empty end feel with all PROM. Will continue to progress PROM as tolerated and begin to implement AAROM next week per post-operative protocol.      REHAB POTENTIAL: Good   CLINICAL DECISION MAKING: Stable/uncomplicated   EVALUATION COMPLEXITY: Low     GOALS: Goals reviewed with patient? No   SHORT TERM GOALS:   STG Name Target Date Goal status  1 Patient will be independent and compliant with initial HEP.    Baseline:  11/18/2021 Achieved   2 Patient will demonstrate WNL PROM of the Lt shoulder in order to progress towards next phase of rehab.  Baseline:  12/09/2021 Ongoing     LONG TERM GOALS:    LTG Name Target Date Goal status  1 Patient will demonstrate at least 160 degrees of Lt shoulder flexion and abduction AROM to improve ability to reach overhead Baseline: 01/23/22 INITIAL  2 Patient will demonstrate at least 50 degrees of Lt shoulder IR/ER AROM to improve ability to complete self-care activities.  Baseline: 01/23/22 INITIAL  3 Patient will demonstrate at least 4/5 Lt shoulder strength to improve ability to lift items.  Baseline: 01/23/22 INITIAL  4 Patient will  demonstrate 5/5 Lt elbow strength to improve ability to  carry objects.  Baseline: 01/23/22 INITIAL  5 Patient will score at least 58% function on FOTO to signify clinically meaningful improvement in functional abilities.  11/18/2021 53% 01/23/22 INITIAL    PLAN: PT FREQUENCY: 2x/week   PT DURATION: 12 weeks   PLANNED INTERVENTIONS: Therapeutic exercises, Therapeutic activity, Neuro Muscular re-education, Patient/Family education, Joint mobilization, Aquatic Therapy, Dry Needling, Cryotherapy, Moist heat, Vasopneumatic device, Ultrasound, and Manual therapy   PLAN FOR NEXT SESSION:  progress per post-op protocol    Gwendolyn Grant, PT, DPT, ATC 11/20/21 10:56 AM

## 2021-11-19 NOTE — Progress Notes (Signed)
11-19-2021: Left patient and patient's wife a message to see if he would like to use free voucher for Ozempic or save it. Was informed that medication was shipped from [place] but returned because it was delivered on a Friday. Patient's copay is $47 and told [facility] and will inform them if patient wants to use voucher. Patient's medication will take 6 weeks to ship out so patient may want to save voucher. ?

## 2021-11-20 ENCOUNTER — Ambulatory Visit: Payer: Medicare PPO | Attending: Orthopedic Surgery

## 2021-11-20 ENCOUNTER — Other Ambulatory Visit: Payer: Self-pay

## 2021-11-20 DIAGNOSIS — M6281 Muscle weakness (generalized): Secondary | ICD-10-CM | POA: Insufficient documentation

## 2021-11-20 DIAGNOSIS — M25512 Pain in left shoulder: Secondary | ICD-10-CM | POA: Diagnosis not present

## 2021-11-20 NOTE — Progress Notes (Addendum)
11-20-2021: [place] and patient picked up [place] yesterday. Will hold off on free voucher because patient may need it in 30 days. Patient's wife called and stated to fill ozempic using voucher and they will pick medication up tomorrow. Contacted [facility] with voucher information and will be ready today. Patient will refrigerate medication and continue to wait on delivery from [place]>  ? ?Jeannette How CMA ?Clinical Pharmacist Assistant ?[phone] ? ?

## 2021-11-24 NOTE — Therapy (Signed)
OUTPATIENT PHYSICAL THERAPY TREATMENT NOTE   Patient Name: Stephen Chase MRN: [id number] DOB:[birth year], 56 y.o., male Today's Date: 11/25/2021  PCP: [name], FNP REFERRING PROVIDER: [name], FNP   PT End of Session - 11/25/21 1017     Visit Number 8    Number of Visits 25    Date for PT Re-Evaluation 01/24/22    Authorization Type [employer] MCR-    Authorization Time Period 2/8-3/24    Authorization - Visit Number 7    Authorization - Number of Visits 12    Progress Note Due on Visit 10    PT Start Time 1017    PT Stop Time 1059    PT Time Calculation (min) 42 min    Activity Tolerance Patient tolerated treatment well    Behavior During Therapy WFL for tasks assessed/performed                   Past Medical History:  Diagnosis Date   Allergy    Arthritis    SHOULDERS   Balanitis    Deafness, sensorineural, childhood onset    BILATERAL SINCE AGE 56  SECONDARY TO UNKNOWN ILLNESS   GERD (gastroesophageal reflux disease)    Hyperlipidemia    Hypertension    No blood products    Patient is Jehovah Witness no blood products.   OSA on CPAP    SEVERE PER STUDY 03-09-2009   Sleep apnea    uses a cpap   Smokers' cough ([place])    Type 2 diabetes mellitus ([place])    Past Surgical History:  Procedure Laterality Date   CIRCUMCISION N/A 12/19/2013   Procedure: CIRCUMCISION ADULT;  Surgeon: Lowella Bandy, MD;  [facility];  Service: Urology;  Laterality: N/A;   SHOULDER SURGERY Right    WRIST SURGERY Left    Patient Active Problem List   Diagnosis Date Noted   Hypersomnia, persistent 05/09/2018   Type 2 diabetes mellitus with diabetic neuropathy, without long-term current use of insulin ([place]) 05/09/2018   Uncontrolled type 2 diabetes mellitus with hyperglycemia ([place]) 05/09/2018   OSA on CPAP 05/09/2018   Esophageal varices without bleeding ([place]) 05/09/2018   Bilateral deafness 05/09/2018    REFERRING DIAG:  s/p left shoulder RCR ,SAD    THERAPY DIAG:  Acute pain of left shoulder  Muscle weakness (generalized)  PERTINENT HISTORY: Bilateral deafness   PRECAUTIONS: shoulder  Interpreter utilized SUBJECTIVE:  Patient reports he decided to take his sling off on his own last week, but has not had f/u with surgeon yet. He has f/u on 12/01/21. He reports pain has been minimal.  PAIN:  Are you having pain? Yes NPRS scale: 2/10 Pain location: Lt shoulder  PAIN TYPE: sore, sharp Pain description: intermittent  Aggravating factors: movement Relieving factors: rest    OBJECTIVE:   *Unless otherwise noted all objective measures were captured on initial evaluation.       PATIENT SURVEYS:  FOTO 12% function 11/18/2021 53%   COGNITION:          Overall cognitive status: Within functional limits for tasks assessed                               SENSATION:          Light touch: not assessed      POSTURE: Forward head, rounded shoulders    PALPATION: Diffuse tenderness about Lt shoulder    UPPER EXTREMITY  AROM/PROM: Rt shoulder AROM WNL with exception of functional IR is moderately limited.    A/PROM Right 10/28/2021 Left PROM 10/28/2021 10/30/21 Left PROM 11/04/21 Left PROM 11/06/21 Left PROM 11/11/21 Left PROM 11/18/2021 Left PROM 11/20/21  Shoulder flexion   65   120 133 128 135  Shoulder extension            Shoulder abduction   69 85 86 100 105 100 110  Shoulder adduction            Shoulder internal rotation            Shoulder external rotation   '8   20   24  '$ Elbow flexion            Elbow extension            Wrist flexion            Wrist extension            Wrist ulnar deviation            Wrist radial deviation            Wrist pronation            Wrist supination            (Blank rows = not tested)   UPPER EXTREMITY MMT: Lt shoulder/elbow MMT deferred secondary to post-op acuity    MMT Right 10/28/2021 Left 10/28/2021  Shoulder flexion      Shoulder extension      Shoulder abduction       Shoulder adduction      Shoulder internal rotation      Shoulder external rotation      Middle trapezius      Lower trapezius      Elbow flexion      Elbow extension      Wrist flexion      Wrist extension      Wrist ulnar deviation      Wrist radial deviation      Wrist pronation      Wrist supination      Grip strength (lbs)      (Blank rows = not tested)                    TODAY'S TREATMENT:  [facility] Adult PT Treatment:                                                DATE: 11/24/21 Therapeutic Exercise: Supine shoulder flexion AAROM with use of RUE 1 x 10 Supine shoulder abduction AAROM with dowel 1 x 10 Seated flexion and abduction towel slides 1 x 10 each  Updated HEP Manual Therapy: Lt shoulder PROM in all planes to tolerance       [facility] Adult PT Treatment:                                                DATE: 11/20/21  Manual Therapy: Lt shoulder, elbow, and wrist PROM to tolerance in all planes grade II-III GHJ mobilizations Lt   Modalities: Ice pack to Lt shoulder x 10 minutes     [facility] Adult PT Treatment:  DATE: 11/18/21 Manual Therapy: Lt shoulder, elbow, and wrist PROM to tolerance in all planes Gentle grade II GHJ mobilizations for pain reduction Modalities: Ice pack to Lt shoulder x 10 minutes    [facility] Adult PT Treatment:                                                DATE: 11/11/21  Manual Therapy: Lt shoulder, elbow, and wrist PROM to tolerance in all planes Gentle grade II GHJ mobilizations for pain reduction Modalities: Ice pack to Lt shoulder x 10 minutes           PATIENT EDUCATION: Education details: see treatment; reiterated current post-op restrictions  Person educated: patient Education method:instruction, handout, demo, verbal cues Education comprehension: verbalized understanding, returned demo, verbal cues required     HOME EXERCISE PROGRAM: Access Code: [email or web address] URL:  [email or web address] Date: 10/28/2021 Prepared by: Gwendolyn Grant   Exercises Flexion-Extension Shoulder Pendulum with Table Support - 3 x daily - 7 x weekly - 1 sets - 60 sec hold Horizontal Shoulder Pendulum with Table Support - 3 x daily - 7 x weekly - 1 sets - 60 sec hold Putty Squeezes - 2 x daily - 7 x weekly - 2 sets - 10 reps Wrist Flexion Extension AROM with Fingers Curled and Palm Down - 2 x daily - 7 x weekly - 2 sets - 10 reps     ASSESSMENT:   CLINICAL IMPRESSION: Patient is progressing slowly 6 weeks s/p Lt RCR. His PROM remains limited in all planes secondary to pain. Able to introduce AAROM today, which he tolerates better than PROM reporting minimal discomfort at available end range of AAROM. He declined ice at the end of the session.      REHAB POTENTIAL: Good   CLINICAL DECISION MAKING: Stable/uncomplicated   EVALUATION COMPLEXITY: Low     GOALS: Goals reviewed with patient? No   SHORT TERM GOALS:   STG Name Target Date Goal status  1 Patient will be independent and compliant with initial HEP.    Baseline:  11/18/2021 Achieved   2 Patient will demonstrate WNL PROM of the Lt shoulder in order to progress towards next phase of rehab.  Baseline:  12/09/2021 Ongoing     LONG TERM GOALS:    LTG Name Target Date Goal status  1 Patient will demonstrate at least 160 degrees of Lt shoulder flexion and abduction AROM to improve ability to reach overhead Baseline: 01/23/22 INITIAL  2 Patient will demonstrate at least 50 degrees of Lt shoulder IR/ER AROM to improve ability to complete self-care activities.  Baseline: 01/23/22 INITIAL  3 Patient will demonstrate at least 4/5 Lt shoulder strength to improve ability to lift items.  Baseline: 01/23/22 INITIAL  4 Patient will demonstrate 5/5 Lt elbow strength to improve ability to carry objects.  Baseline: 01/23/22 INITIAL  5 Patient will score at least 58% function on FOTO to signify clinically meaningful  improvement in functional abilities.  11/18/2021 53% 01/23/22 INITIAL    PLAN: PT FREQUENCY: 2x/week   PT DURATION: 12 weeks   PLANNED INTERVENTIONS: Therapeutic exercises, Therapeutic activity, Neuro Muscular re-education, Patient/Family education, Joint mobilization, Aquatic Therapy, Dry Needling, Cryotherapy, Moist heat, Vasopneumatic device, Ultrasound, and Manual therapy   PLAN FOR NEXT SESSION:  progress per post-op protocol    Gwendolyn Grant, PT, DPT, ATC 11/25/21 11:00 AM

## 2021-11-25 ENCOUNTER — Ambulatory Visit: Payer: Medicare PPO

## 2021-11-25 ENCOUNTER — Other Ambulatory Visit: Payer: Self-pay

## 2021-11-25 DIAGNOSIS — M6281 Muscle weakness (generalized): Secondary | ICD-10-CM | POA: Diagnosis not present

## 2021-11-25 DIAGNOSIS — M25512 Pain in left shoulder: Secondary | ICD-10-CM | POA: Diagnosis not present

## 2021-11-25 NOTE — Telephone Encounter (Signed)
Opened in error

## 2021-11-27 ENCOUNTER — Other Ambulatory Visit: Payer: Self-pay

## 2021-11-27 ENCOUNTER — Telehealth: Payer: Self-pay

## 2021-11-27 ENCOUNTER — Ambulatory Visit: Payer: Medicare PPO

## 2021-11-27 DIAGNOSIS — M25512 Pain in left shoulder: Secondary | ICD-10-CM | POA: Diagnosis not present

## 2021-11-27 DIAGNOSIS — M6281 Muscle weakness (generalized): Secondary | ICD-10-CM

## 2021-11-27 NOTE — Therapy (Signed)
OUTPATIENT PHYSICAL THERAPY TREATMENT NOTE   Patient Name: Stephen Chase MRN: [id number] DOB:02-14-1966, 56 y.o., male Today's Date: 11/27/2021  PCP: [name], FNP REFERRING PROVIDER: Tania Ade, MD   PT End of Session - 11/27/21 1019     Visit Number 9    Number of Visits 25    Date for PT Re-Evaluation 01/24/22    Authorization Type [employer] MCR-    Authorization Time Period 2/8-3/24    Authorization - Visit Number 8    Authorization - Number of Visits 12    Progress Note Due on Visit 10    PT Start Time 1019    PT Stop Time 1110   ice 10 minutes   PT Time Calculation (min) 51 min    Activity Tolerance Patient tolerated treatment well    Behavior During Therapy WFL for tasks assessed/performed                    Past Medical History:  Diagnosis Date   Allergy    Arthritis    SHOULDERS   Balanitis    Deafness, sensorineural, childhood onset    BILATERAL SINCE AGE 28  SECONDARY TO UNKNOWN ILLNESS   GERD (gastroesophageal reflux disease)    Hyperlipidemia    Hypertension    No blood products    Patient is Jehovah Witness no blood products.   OSA on CPAP    SEVERE PER STUDY 03-09-2009   Sleep apnea    uses a cpap   Smokers' cough ([place])    Type 2 diabetes mellitus ([place])    Past Surgical History:  Procedure Laterality Date   CIRCUMCISION N/A 12/19/2013   Procedure: CIRCUMCISION ADULT;  Surgeon: Lowella Bandy, MD;  Location: [facility];  Service: Urology;  Laterality: N/A;   SHOULDER SURGERY Right    WRIST SURGERY Left    Patient Active Problem List   Diagnosis Date Noted   Hypersomnia, persistent 05/09/2018   Type 2 diabetes mellitus with diabetic neuropathy, without long-term current use of insulin ([place]) 05/09/2018   Uncontrolled type 2 diabetes mellitus with hyperglycemia ([place]) 05/09/2018   OSA on CPAP 05/09/2018   Esophageal varices without bleeding ([place]) 05/09/2018   Bilateral deafness 05/09/2018    REFERRING DIAG:  s/p left  shoulder RCR ,SAD   THERAPY DIAG:  Acute pain of left shoulder  Muscle weakness (generalized)  PERTINENT HISTORY: Bilateral deafness   PRECAUTIONS: shoulder  Interpreter utilized SUBJECTIVE:  Patient reports the shoulder is continuing to feel better. Mostly just soreness at this point.  PAIN:  Are you having pain? Yes NPRS scale: 2/10 Pain location: Lt shoulder  PAIN TYPE: sore,  Pain description: intermittent  Aggravating factors: movement Relieving factors: rest    OBJECTIVE:   *Unless otherwise noted all objective measures were captured on initial evaluation.       PATIENT SURVEYS:  FOTO 12% function 11/18/2021 53%   COGNITION:          Overall cognitive status: Within functional limits for tasks assessed                               SENSATION:          Light touch: not assessed      POSTURE: Forward head, rounded shoulders    PALPATION: Diffuse tenderness about Lt shoulder    UPPER EXTREMITY AROM/PROM: Rt shoulder AROM WNL with exception of functional IR is moderately limited.  A/PROM Right 10/28/2021 Left PROM 10/28/2021 10/30/21 Left PROM 11/04/21 Left PROM 11/06/21 Left PROM 11/11/21 Left PROM 11/18/2021 Left PROM 11/20/21 11/27/21 Left PROM  Shoulder flexion   65   120 133 128 135   Shoulder extension             Shoulder abduction   69 85 86 100 105 100 110 116  Shoulder adduction             Shoulder internal rotation             Shoulder external rotation   '8   20   24 '$ 45  Elbow flexion             Elbow extension             Wrist flexion             Wrist extension             Wrist ulnar deviation             Wrist radial deviation             Wrist pronation             Wrist supination             (Blank rows = not tested)   UPPER EXTREMITY MMT: Lt shoulder/elbow MMT deferred secondary to post-op acuity    MMT Right 10/28/2021 Left 10/28/2021  Shoulder flexion      Shoulder extension      Shoulder abduction      Shoulder adduction       Shoulder internal rotation      Shoulder external rotation      Middle trapezius      Lower trapezius      Elbow flexion      Elbow extension      Wrist flexion      Wrist extension      Wrist ulnar deviation      Wrist radial deviation      Wrist pronation      Wrist supination      Grip strength (lbs)      (Blank rows = not tested)                    TODAY'S TREATMENT:  [facility] Adult PT Treatment:                                                DATE: 11/27/21 Therapeutic Exercise: Supine shoulder flexion AAROM 1 x 10 with dowel  Supine shoulder ER AAROM 1 x 10 with dowel  Pulleys 2 min each flexion/scaption  Supine shoulder abduction AAROM 1 x 10 with dowel  Manual Therapy: Lt shoulder PROM in all planes to tolerance  Lt GHJ inferior mobilization grade II-III Modalities: Ice pack to Lt shoulder x 10 minutes    [facility] Adult PT Treatment:                                                DATE: 11/24/21 Therapeutic Exercise: Supine shoulder flexion AAROM with use of RUE 1 x 10 Supine shoulder abduction AAROM with dowel 1 x 10 Seated flexion and  abduction towel slides 1 x 10 each  Updated HEP Manual Therapy: Lt shoulder PROM in all planes to tolerance       [facility] Adult PT Treatment:                                                DATE: 11/20/21  Manual Therapy: Lt shoulder, elbow, and wrist PROM to tolerance in all planes grade II-III GHJ mobilizations Lt   Modalities: Ice pack to Lt shoulder x 10 minutes     [facility] Adult PT Treatment:                                                DATE: 11/18/21 Manual Therapy: Lt shoulder, elbow, and wrist PROM to tolerance in all planes Gentle grade II GHJ mobilizations for pain reduction Modalities: Ice pack to Lt shoulder x 10 minutes          PATIENT EDUCATION: Education details: recommended to continue use of sling as post-op protocol states to discontinue at end of week 6.  Person educated: patient  Education  method:instruction  Education comprehension: verbalized understanding      HOME EXERCISE PROGRAM: Access Code: [email or web address] URL: [email or web address] Date: 10/28/2021 Prepared by: Gwendolyn Grant   Exercises Flexion-Extension Shoulder Pendulum with Table Support - 3 x daily - 7 x weekly - 1 sets - 60 sec hold Horizontal Shoulder Pendulum with Table Support - 3 x daily - 7 x weekly - 1 sets - 60 sec hold Putty Squeezes - 2 x daily - 7 x weekly - 2 sets - 10 reps Wrist Flexion Extension AROM with Fingers Curled and Palm Down - 2 x daily - 7 x weekly - 2 sets - 10 reps     ASSESSMENT:   CLINICAL IMPRESSION: Patient demonstrates significant improvement in Lt shoulder ER PROM compared to last assessment. Abduction PROM has minimally improved with empty end feel present. Continued to progress AAROM, which he tolerates well visually achieving further range with AAROM compared to PROM.      REHAB POTENTIAL: Good   CLINICAL DECISION MAKING: Stable/uncomplicated   EVALUATION COMPLEXITY: Low     GOALS: Goals reviewed with patient? No   SHORT TERM GOALS:   STG Name Target Date Goal status  1 Patient will be independent and compliant with initial HEP.    Baseline:  11/18/2021 Achieved   2 Patient will demonstrate WNL PROM of the Lt shoulder in order to progress towards next phase of rehab.  Baseline:  12/09/2021 Ongoing     LONG TERM GOALS:    LTG Name Target Date Goal status  1 Patient will demonstrate at least 160 degrees of Lt shoulder flexion and abduction AROM to improve ability to reach overhead Baseline: 01/23/22 INITIAL  2 Patient will demonstrate at least 50 degrees of Lt shoulder IR/ER AROM to improve ability to complete self-care activities.  Baseline: 01/23/22 INITIAL  3 Patient will demonstrate at least 4/5 Lt shoulder strength to improve ability to lift items.  Baseline: 01/23/22 INITIAL  4 Patient will demonstrate 5/5 Lt elbow strength to improve ability to  carry objects.  Baseline: 01/23/22 INITIAL  5 Patient will score at least 58% function on FOTO to signify clinically meaningful  improvement in functional abilities.  11/18/2021 53% 01/23/22 INITIAL    PLAN: PT FREQUENCY: 2x/week   PT DURATION: 12 weeks   PLANNED INTERVENTIONS: Therapeutic exercises, Therapeutic activity, Neuro Muscular re-education, Patient/Family education, Joint mobilization, Aquatic Therapy, Dry Needling, Cryotherapy, Moist heat, Vasopneumatic device, Ultrasound, and Manual therapy   PLAN FOR NEXT SESSION:  progress per post-op protocol    Gwendolyn Grant, PT, DPT, ATC 11/27/21 11:06 AM

## 2021-11-27 NOTE — Progress Notes (Signed)
? ? ?  Called [name], No answer, left message of appointment on 12-02-2021 at 3:00 via telephone visit with [name], Pharm D. Notified to have all medications, supplements, blood pressure and/or blood sugar logs available during appointment and to return call if need to reschedule. Also sent text message. ? ? ? ?Jeannette How CMA ?Clinical Pharmacist Assistant ?[phone] ? ? ?

## 2021-12-02 ENCOUNTER — Other Ambulatory Visit: Payer: Self-pay

## 2021-12-02 ENCOUNTER — Telehealth: Payer: Medicare PPO

## 2021-12-02 ENCOUNTER — Ambulatory Visit: Payer: Medicare PPO

## 2021-12-02 DIAGNOSIS — M6281 Muscle weakness (generalized): Secondary | ICD-10-CM | POA: Diagnosis not present

## 2021-12-02 DIAGNOSIS — M25512 Pain in left shoulder: Secondary | ICD-10-CM | POA: Diagnosis not present

## 2021-12-02 NOTE — Therapy (Signed)
?OUTPATIENT PHYSICAL THERAPY TREATMENT NOTE ?Progress Note ?Reporting Period 10/28/21 to 12/02/21 ? ?See note below for Objective Data and Assessment of Progress/Goals.  ? ? ? ? ?Patient Name: Stephen Chase ?MRN: [id number] ?DOB:1965-10-01, 56 y.o., male ?Today's Date: 12/02/2021 ? ?PCP: [name], FNP ?REFERRING PROVIDER: [name], FNP ? ? PT End of Session - 12/02/21 1017   ? ? Visit Number 10   ? Number of Visits 25   ? Date for PT Re-Evaluation 01/24/22   ? Authorization Type [employer] MCR-   ? Authorization Time Period 2/8-3/24   ? Authorization - Visit Number 9   ? Authorization - Number of Visits 12   ? Progress Note Due on Visit 20   ? PT Start Time 1017   ? PT Stop Time 1110   ice pack 10 minutes  ? PT Time Calculation (min) 53 min   ? Activity Tolerance Patient tolerated treatment well   ? Behavior During Therapy [facility] for tasks assessed/performed   ? ?  ?  ? ?  ? ? ? ? ? ? ? ? ? ? ?Past Medical History:  ?Diagnosis Date  ? Allergy   ? Arthritis   ? SHOULDERS  ? Balanitis   ? Deafness, sensorineural, childhood onset   ? BILATERAL SINCE AGE 55  SECONDARY TO UNKNOWN ILLNESS  ? GERD (gastroesophageal reflux disease)   ? Hyperlipidemia   ? Hypertension   ? No blood products   ? Patient is Jehovah Witness no blood products.  ? OSA on CPAP   ? SEVERE PER STUDY 03-09-2009  ? Sleep apnea   ? uses a cpap  ? Smokers' cough ([place])   ? Type 2 diabetes mellitus ([place])   ? ?Past Surgical History:  ?Procedure Laterality Date  ? CIRCUMCISION N/A 12/19/2013  ? Procedure: CIRCUMCISION ADULT;  Surgeon: Lowella Bandy, MD;  Location: [facility];  Service: Urology;  Laterality: N/A;  ? SHOULDER SURGERY Right   ? WRIST SURGERY Left   ? ?Patient Active Problem List  ? Diagnosis Date Noted  ? Hypersomnia, persistent 05/09/2018  ? Type 2 diabetes mellitus with diabetic neuropathy, without long-term current use of insulin ([place]) 05/09/2018  ? Uncontrolled type 2 diabetes mellitus with hyperglycemia ([place]) 05/09/2018  ? OSA on  CPAP 05/09/2018  ? Esophageal varices without bleeding ([place]) 05/09/2018  ? Bilateral deafness 05/09/2018  ? ? ?REFERRING DIAG:  s/p left shoulder RCR ,SAD  ? ?THERAPY DIAG:  ?Acute pain of left shoulder ? ?Muscle weakness (generalized) ? ?PERTINENT HISTORY: Bilateral deafness  ? ?PRECAUTIONS: shoulder ? ?Interpreter utilized ?SUBJECTIVE:  ?Patient had f/u with MD yesterday and is cleared from wearing his sling with recommendation to continue with PT. He has next f/u in 6 weeks. Patient reports the pain has lessened, but the shoulder is sore.  ?PAIN:  ?Are you having pain? Yes ?NPRS scale: 2/10 ?Pain location: Lt shoulder  ?PAIN TYPE: sore  ?Pain description: intermittent  ?Aggravating factors: movement ?Relieving factors: rest  ? ? ?OBJECTIVE:  ? *Unless otherwise noted all objective measures were captured on initial evaluation.  ? ?  ?  ?PATIENT SURVEYS:  ?FOTO 12% function ?11/18/2021 53% ?12/02/21: 69% ?  ?  ?POSTURE: ?Forward head, rounded shoulders  ?  ?PALPATION: ?Diffuse tenderness about Lt shoulder  ?  ?UPPER EXTREMITY AROM/PROM: Rt shoulder AROM WNL with exception of functional IR is moderately limited.  ?  ?A/PROM Right ?10/28/2021 Left PROM ?10/28/2021 10/30/21 ?Left PROM 11/04/21 ?Left PROM 11/06/21 ?Left PROM 11/11/21 ?  Left PROM 11/18/2021 ?Left PROM 11/20/21 11/27/21 ?Left PROM 12/02/21 ?Left   ?Shoulder flexion   65   120 133 128 135  PROM 145 ?AROM 140 supine    ?Shoulder extension              ?Shoulder abduction   69 85 86 100 105 100 110 116 PROM 116  ?AROM 135 supine  ?Shoulder adduction              ?Shoulder internal rotation            PROM 65 ?AROM 55   ?Shoulder external rotation   8   20   24 45 PROM 55 ?AROM 35  ?Elbow flexion              ?Elbow extension              ?Wrist flexion              ?Wrist extension              ?Wrist ulnar deviation              ?Wrist radial deviation              ?Wrist pronation              ?Wrist supination              ?(Blank rows = not tested) ?  ?UPPER EXTREMITY  MMT:  ?  ?MMT Right ?10/28/2021 Left ?10/28/2021 12/02/21 ?Left   ?Shoulder flexion     3-/5  ?Shoulder extension       ?Shoulder abduction     3-/5  ?Shoulder adduction       ?Shoulder internal rotation     3-/5  ?Shoulder external rotation     3-/5  ?Middle trapezius       ?Lower trapezius       ?Elbow flexion     4/5  ?Elbow extension     4/5  ?Wrist flexion       ?Wrist extension       ?Wrist ulnar deviation       ?Wrist radial deviation       ?Wrist pronation       ?Wrist supination       ?Grip strength (lbs)       ?(Blank rows = not tested) ?  ?  ?  ?  ?           ?TODAY'S TREATMENT:  ?[facility] Adult PT Treatment:                                                DATE: 12/02/21 ?Therapeutic Exercise: ?Towel slides at wall 1 x 10 flexion and abduction  ?UBE level 1 x 4 minutes fwd ?Shoulder isometrics 1 x 10; 5 sec hold; ER, IR  ?Updated HEP ?Manual Therapy: ?Lt shoulder PROM in all planes to tolerance  ?Lt GHJ inferior mobilization grade II-III ?Modalities: ?Ice pack to Lt shoulder x 10 minutes  ? ? ? ?[facility] Adult PT Treatment:                                                  DATE: 11/27/21 ?Therapeutic Exercise: ?Supine shoulder flexion AAROM 1 x 10 with dowel  ?Supine shoulder ER AAROM 1 x 10 with dowel  ?Pulleys 2 min each flexion/scaption  ?Supine shoulder abduction AAROM 1 x 10 with dowel  ?Manual Therapy: ?Lt shoulder PROM in all planes to tolerance  ?Lt GHJ inferior mobilization grade II-III ?Modalities: ?Ice pack to Lt shoulder x 10 minutes  ? ? ?[facility] Adult PT Treatment:                                                DATE: 11/24/21 ?Therapeutic Exercise: ?Supine shoulder flexion AAROM with use of RUE 1 x 10 ?Supine shoulder abduction AAROM with dowel 1 x 10 ?Seated flexion and abduction towel slides 1 x 10 each  ?Updated HEP ?Manual Therapy: ?Lt shoulder PROM in all planes to tolerance  ? ? ? ? ? ? ? ? ?  ?   ?PATIENT EDUCATION: ?Education details: progress towards goals; see treatment   ?Person educated: patient   ?Education method:instruction, demo,cues, handout  ?Education comprehension: verbalized understanding, returned demo  ?  ?  ?[place]: ?Access Code: [email or web address] ?URL: [email or web address] ?Date: 10/28/2021 ?Prepared by: Gwendolyn Grant ?  ?Exercises ?Flexion-Extension Shoulder Pendulum with Table Support - 3 x daily - 7 x weekly - 1 sets - 60 sec hold ?Horizontal Shoulder Pendulum with Table Support - 3 x daily - 7 x weekly - 1 sets - 60 sec hold ?Putty Squeezes - 2 x daily - 7 x weekly - 2 sets - 10 reps ?Wrist Flexion Extension AROM with Fingers Curled and Palm Down - 2 x daily - 7 x weekly - 2 sets - 10 reps ?  ?  ?ASSESSMENT: ?  ?CLINICAL IMPRESSION: ?Patient is 7 weeks s/p Lt RCR making slow progress in regards to his Lt shoulder ROM and strength at this time. He demonstrates gradual improvement in PROM since the start of care, though continues to be limited in PROM secondary to pain. He has improved tolerance to recent progression of AAROM and AROM. He will benefit from continuing with current POC to assist in restoring his Lt shoulder ROM and strength in order to optimize his function.  ? ? ?  ?REHAB POTENTIAL: Good ?  ?CLINICAL DECISION MAKING: Stable/uncomplicated ?  ?EVALUATION COMPLEXITY: Low ?  ?  ?GOALS: ?Goals reviewed with patient? No ?  ?SHORT TERM GOALS: ?  ?STG Name Target Date Goal status  ?1 Patient will be independent and compliant with initial HEP.  ?  ?Baseline:  11/18/2021 Achieved   ?2 Patient will demonstrate WNL PROM of the Lt shoulder in order to progress towards next phase of rehab.  ?Baseline:  12/09/2021 Ongoing   ?  ?LONG TERM GOALS:  ?  ?LTG Name Target Date Goal status  ?1 Patient will demonstrate at least 160 degrees of Lt shoulder flexion and abduction AROM to improve ability to reach overhead ?Baseline: 01/23/22 Ongoing   ?2 Patient will demonstrate at least 50 degrees of Lt shoulder IR/ER AROM to improve ability to complete self-care activities.  ?Baseline:  01/23/22 Partially met    ?3 Patient will demonstrate at least 4/5 Lt shoulder strength to improve ability to lift items.  ?Baseline: 01/23/22 Ongoing   ?4 Patient will demonstrate 5/5 Lt elbow strength to improve abil

## 2021-12-04 ENCOUNTER — Ambulatory Visit: Payer: Medicare PPO

## 2021-12-04 ENCOUNTER — Other Ambulatory Visit: Payer: Self-pay

## 2021-12-04 DIAGNOSIS — M6281 Muscle weakness (generalized): Secondary | ICD-10-CM | POA: Diagnosis not present

## 2021-12-04 DIAGNOSIS — M25512 Pain in left shoulder: Secondary | ICD-10-CM | POA: Diagnosis not present

## 2021-12-04 NOTE — Therapy (Signed)
? ? ? ? ? ?Patient Name: Stephen Chase ?MRN: [id number] ?DOB:1966-05-30, 56 y.o., male ?Today's Date: 12/04/2021 ? ?PCP: [name], FNP ?REFERRING PROVIDER: Tania Ade, MD ? ? PT End of Session - 12/04/21 1016   ? ? Visit Number 11   ? Number of Visits 25   ? Date for PT Re-Evaluation 01/24/22   ? Authorization Type [employer] MCR-   ? Authorization Time Period 2/8-3/24   ? Authorization - Visit Number 10   ? Authorization - Number of Visits 12   ? Progress Note Due on Visit 20   ? PT Start Time 1016   ? PT Stop Time 1106   ice 10 minutes  ? PT Time Calculation (min) 50 min   ? Activity Tolerance Patient tolerated treatment well   ? Behavior During Therapy [facility] for tasks assessed/performed   ? ?  ?  ? ?  ? ? ? ? ? ? ? ? ? ? ? ?Past Medical History:  ?Diagnosis Date  ? Allergy   ? Arthritis   ? SHOULDERS  ? Balanitis   ? Deafness, sensorineural, childhood onset   ? BILATERAL SINCE AGE 45  SECONDARY TO UNKNOWN ILLNESS  ? GERD (gastroesophageal reflux disease)   ? Hyperlipidemia   ? Hypertension   ? No blood products   ? Patient is Jehovah Witness no blood products.  ? OSA on CPAP   ? SEVERE PER STUDY 03-09-2009  ? Sleep apnea   ? uses a cpap  ? Smokers' cough ([place])   ? Type 2 diabetes mellitus ([place])   ? ?Past Surgical History:  ?Procedure Laterality Date  ? CIRCUMCISION N/A 12/19/2013  ? Procedure: CIRCUMCISION ADULT;  Surgeon: Lowella Bandy, MD;  Location: [facility];  Service: Urology;  Laterality: N/A;  ? SHOULDER SURGERY Right   ? WRIST SURGERY Left   ? ?Patient Active Problem List  ? Diagnosis Date Noted  ? Hypersomnia, persistent 05/09/2018  ? Type 2 diabetes mellitus with diabetic neuropathy, without long-term current use of insulin ([place]) 05/09/2018  ? Uncontrolled type 2 diabetes mellitus with hyperglycemia ([place]) 05/09/2018  ? OSA on CPAP 05/09/2018  ? Esophageal varices without bleeding ([place]) 05/09/2018  ? Bilateral deafness 05/09/2018  ? ? ?REFERRING DIAG:  s/p left shoulder RCR ,SAD  ? ?THERAPY  DIAG:  ?Acute pain of left shoulder ? ?Muscle weakness (generalized) ? ?PERTINENT HISTORY: Bilateral deafness  ? ?PRECAUTIONS: shoulder ? ?Interpreter utilized ?SUBJECTIVE:  ?"Better, but still sore."  ?PAIN:  ?Are you having pain? Yes ?NPRS scale: 2/10 ?Pain location: Lt shoulder  ?PAIN TYPE: sore  ?Pain description: intermittent  ?Aggravating factors: movement ?Relieving factors: rest  ? ? ?OBJECTIVE:  ? *Unless otherwise noted all objective measures were captured on initial evaluation.  ? ?  ?  ?PATIENT SURVEYS:  ?FOTO 12% function ?11/18/2021 53% ?12/02/21: 69% ?  ?  ?POSTURE: ?Forward head, rounded shoulders  ?  ?PALPATION: ?Diffuse tenderness about Lt shoulder  ?  ?UPPER EXTREMITY AROM/PROM: Rt shoulder AROM WNL with exception of functional IR is moderately limited.  ?  ?A/PROM Right ?10/28/2021 Left PROM ?10/28/2021 10/30/21 ?Left PROM 11/04/21 ?Left PROM 11/06/21 ?Left PROM 11/11/21 ?Left PROM 11/18/2021 ?Left PROM 11/20/21 11/27/21 ?Left PROM 12/02/21 ?Left  3/16  ?Shoulder flexion   65   120 133 128 135  PROM 145 ?AROM 140 supine   PROM: 158  ?Shoulder extension               ?Shoulder abduction  69 85 86 100 105 100 110 116 PROM 116  ?AROM 135 supine   ?Shoulder adduction               ?Shoulder internal rotation            PROM 65 ?AROM 55    ?Shoulder external rotation   _0 45 PROM 55 ?AROM 35   ?Elbow flexion               ?Elbow extension               ?Wrist flexion               ?Wrist extension               ?Wrist ulnar deviation               ?Wrist radial deviation               ?Wrist pronation               ?Wrist supination               ?(Blank rows = not tested) ?  ?UPPER EXTREMITY MMT:  ?  ?MMT Right ?10/28/2021 Left ?10/28/2021 12/02/21 ?Left   ?Shoulder flexion     3-/5  ?Shoulder extension       ?Shoulder abduction     3-/5  ?Shoulder adduction       ?Shoulder internal rotation     3-/5  ?Shoulder external rotation     3-/5  ?Middle trapezius       ?Lower trapezius       ?Elbow flexion     4/5   ?Elbow extension     4/5  ?Wrist flexion       ?Wrist extension       ?Wrist ulnar deviation       ?Wrist radial deviation       ?Wrist pronation       ?Wrist supination       ?Grip strength (lbs)       ?(Blank rows = not tested) ?  ?  ?  ?  ?           ?TODAY'S TREATMENT:  ?[facility] Adult PT Treatment:                                                DATE: 12/04/21 ?Therapeutic Exercise: ?UBE level 1 x 4 minutes fwd ?Supine shoulder flexion with stability ball 1 x 10  ?Standing ER AAROM 1 x 10 with dowel  ?Standing abduction AAROM 1 x 10 with dowel  ?Finger ladder flexion 1 x 5  ?Shoulder isometrics: flexion 1 x 10 ; 5 sec hold  ?Manual Therapy: ?Lt shoulder PROM in all planes to tolerance  ?Lt GHJ inferior mobilization grade II-III ?Modalities: ?Ice pack to Lt shoulder x 10 minutes  ? ?[facility] Adult PT Treatment:                                                DATE: 12/02/21 ?Therapeutic Exercise: ?Towel slides at wall 1 x 10 flexion and abduction  ?UBE level 1 x 4  minutes fwd ?Shoulder isometrics 1 x 10; 5 sec hold; ER, IR  ?Updated HEP ?Manual Therapy: ?Lt shoulder PROM in all planes to tolerance  ?Lt GHJ inferior mobilization grade II-III ?Modalities: ?Ice pack to Lt shoulder x 10 minutes  ? ? ? ?[facility] Adult PT Treatment:                                                DATE: 11/27/21 ?Therapeutic Exercise: ?Supine shoulder flexion AAROM 1 x 10 with dowel  ?Supine shoulder ER AAROM 1 x 10 with dowel  ?Pulleys 2 min each flexion/scaption  ?Supine shoulder abduction AAROM 1 x 10 with dowel  ?Manual Therapy: ?Lt shoulder PROM in all planes to tolerance  ?Lt GHJ inferior mobilization grade II-III ?Modalities: ?Ice pack to Lt shoulder x 10 minutes  ? ? ?[facility] Adult PT Treatment:                                                DATE: 11/24/21 ?Therapeutic Exercise: ?Supine shoulder flexion AAROM with use of RUE 1 x 10 ?Supine shoulder abduction AAROM with dowel 1 x 10 ?Seated flexion and abduction towel slides 1 x 10 each  ?Updated  HEP ?Manual Therapy: ?Lt shoulder PROM in all planes to tolerance  ? ? ? ? ? ? ? ? ?  ?   ?PATIENT EDUCATION: ?Education details: n/a ?Person educated: n/a  ?Education method:n/a ?Education comprehension: n/a ?  ?  ?HOME EXERCISE PROGRAM: ?Access Code: [email or web address] ?URL: [email or web address] ?Date: 10/28/2021 ?Prepared by: Gwendolyn Grant ?  ?Exercises ?Flexion-Extension Shoulder Pendulum with Table Support - 3 x daily - 7 x weekly - 1 sets - 60 sec hold ?Horizontal Shoulder Pendulum with Table Support - 3 x daily - 7 x weekly - 1 sets - 60 sec hold ?Putty Squeezes - 2 x daily - 7 x weekly - 2 sets - 10 reps ?Wrist Flexion Extension AROM with Fingers Curled and Palm Down - 2 x daily - 7 x weekly - 2 sets - 10 reps ?  ?  ?ASSESSMENT: ?  ?CLINICAL IMPRESSION: ? ? Patient tolerated session well today focusing on progressing Lt shoulder ROM. His passive shoulder flexion ROM has significantly improved from two days ago, nearing functional range at this time. He continues to be limited in passive abduction and ER secondary to pain. Improved tolerance to AAROM into ER and abduction compared to PROM.  ? ?  ?REHAB POTENTIAL: Good ?  ?CLINICAL DECISION MAKING: Stable/uncomplicated ?  ?EVALUATION COMPLEXITY: Low ?  ?  ?GOALS: ?Goals reviewed with patient? No ?  ?SHORT TERM GOALS: ?  ?STG Name Target Date Goal status  ?1 Patient will be independent and compliant with initial HEP.  ?  ?Baseline:  11/18/2021 Achieved   ?2 Patient will demonstrate WNL PROM of the Lt shoulder in order to progress towards next phase of rehab.  ?Baseline:  12/09/2021 Ongoing   ?  ?LONG TERM GOALS:  ?  ?LTG Name Target Date Goal status  ?1 Patient will demonstrate at least 160 degrees of Lt shoulder flexion and abduction AROM to improve ability to reach overhead ?Baseline: 01/23/22 Ongoing   ?2 Patient will demonstrate at least 50 degrees of Lt shoulder IR/ER AROM  to improve ability to complete self-care activities.  ?Baseline: 01/23/22 Partially  met    ?3 Patient will demonstrate at least 4/5 Lt shoulder strength to improve ability to lift items.  ?Baseline: 01/23/22 Ongoing   ?4 Patient will demonstrate 5/5 Lt elbow strength to improve ability to carry o

## 2021-12-05 ENCOUNTER — Telehealth: Payer: Self-pay

## 2021-12-05 NOTE — Telephone Encounter (Signed)
?  Care Management  ? ?Follow Up Note ? ? ?12/05/2021 ?Name: Stephen Chase MRN: [id number] DOB: July 09, 1966 ? ? ?Referred by: [name], FNP ?Reason for referral : No chief complaint on file. ? ? ?An unsuccessful telephone outreach was attempted today. The patient was referred to the case management team for assistance with care management and care coordination.  ? ?Follow Up Plan: The patient has been provided with contact information for the care management team and has been advised to call with any health related questions or concerns.  ? ?[name], CPP, PharmD ?Clinical Pharmacist Practitioner ?[facility] ?[phone] ? ? ?

## 2021-12-06 ENCOUNTER — Encounter: Payer: Self-pay | Admitting: Nurse Practitioner

## 2021-12-08 ENCOUNTER — Other Ambulatory Visit: Payer: Self-pay

## 2021-12-08 NOTE — Therapy (Signed)
? ? ? ? ? ?Patient Name: Stephen Chase ?MRN: [id number] ?DOB:[birth year], 56 y.o., male ?Today's Date: 12/09/2021 ? ?PCP: [name], FNP ?REFERRING PROVIDER: [name], FNP ? ? PT End of Session - 12/09/21 1016   ? ? Visit Number 12   ? Number of Visits 25   ? Date for PT Re-Evaluation 01/24/22   ? Authorization Type [employer] MCR-   ? Authorization Time Period 2/8-3/24   ? Authorization - Visit Number 11   ? Authorization - Number of Visits 12   ? Progress Note Due on Visit 20   ? PT Start Time 1016   ? PT Stop Time 1058   ? PT Time Calculation (min) 42 min   ? Activity Tolerance Patient tolerated treatment well   ? Behavior During Therapy [facility] for tasks assessed/performed   ? ?  ?  ? ?  ? ? ? ? ? ? ? ? ? ? ? ? ?Past Medical History:  ?Diagnosis Date  ? Allergy   ? Arthritis   ? SHOULDERS  ? Balanitis   ? Deafness, sensorineural, childhood onset   ? BILATERAL SINCE AGE 56  SECONDARY TO UNKNOWN ILLNESS  ? GERD (gastroesophageal reflux disease)   ? Hyperlipidemia   ? Hypertension   ? No blood products   ? Patient is Jehovah Witness no blood products.  ? OSA on CPAP   ? SEVERE PER STUDY 03-09-2009  ? Sleep apnea   ? uses a cpap  ? Smokers' cough ([place])   ? Type 2 diabetes mellitus ([place])   ? ?Past Surgical History:  ?Procedure Laterality Date  ? CIRCUMCISION N/A 12/19/2013  ? Procedure: CIRCUMCISION ADULT;  Surgeon: Lowella Bandy, MD;  Location: [facility];  Service: Urology;  Laterality: N/A;  ? SHOULDER SURGERY Right   ? WRIST SURGERY Left   ? ?Patient Active Problem List  ? Diagnosis Date Noted  ? Hypersomnia, persistent 05/09/2018  ? Type 2 diabetes mellitus with diabetic neuropathy, without long-term current use of insulin ([place]) 05/09/2018  ? Uncontrolled type 2 diabetes mellitus with hyperglycemia ([place]) 05/09/2018  ? OSA on CPAP 05/09/2018  ? Esophageal varices without bleeding ([place]) 05/09/2018  ? Bilateral deafness 05/09/2018  ? ? ?REFERRING DIAG:  s/p left shoulder RCR ,SAD  ? ?THERAPY DIAG:  ?Acute  pain of left shoulder ? ?Muscle weakness (generalized) ? ?PERTINENT HISTORY: Bilateral deafness  ? ?PRECAUTIONS: shoulder ? ?Interpreter utilized ?SUBJECTIVE:  ?Patient reports the shoulder is feeling good, but is still stiff.  ?PAIN:  ?Are you having pain? Yes ?NPRS scale: 2/10 ?Pain location: Lt shoulder  ?PAIN TYPE: sore  ?Pain description: intermittent  ?Aggravating factors: movement ?Relieving factors: rest  ? ? ?OBJECTIVE:  ? *Unless otherwise noted all objective measures were captured on initial evaluation.  ? ?  ?  ?PATIENT SURVEYS:  ?FOTO 12% function ?11/18/2021 53% ?12/02/21: 69% ?  ?  ?POSTURE: ?Forward head, rounded shoulders  ?  ?PALPATION: ?Diffuse tenderness about Lt shoulder  ?  ?UPPER EXTREMITY AROM/PROM: Rt shoulder AROM WNL with exception of functional IR is moderately limited.  ?  ?A/PROM Right ?10/28/2021 Left PROM ?10/28/2021 10/30/21 ?Left PROM 11/04/21 ?Left PROM 11/06/21 ?Left PROM 11/11/21 ?Left PROM 11/18/2021 ?Left PROM 11/20/21 11/27/21 ?Left PROM 12/02/21 ?Left  3/16  ?Shoulder flexion   65   120 133 128 135  PROM 145 ?AROM 140 supine   PROM: 158  ?Shoulder extension               ?  Shoulder abduction   69 85 86 100 105 100 110 116 PROM 116  ?AROM 135 supine   ?Shoulder adduction               ?Shoulder internal rotation            PROM 65 ?AROM 55    ?Shoulder external rotation   '8   20   24 '$ 45 PROM 55 ?AROM 35   ?Elbow flexion               ?Elbow extension               ?Wrist flexion               ?Wrist extension               ?Wrist ulnar deviation               ?Wrist radial deviation               ?Wrist pronation               ?Wrist supination               ?(Blank rows = not tested) ?  ?UPPER EXTREMITY MMT:  ?  ?MMT Right ?10/28/2021 Left ?10/28/2021 12/02/21 ?Left   ?Shoulder flexion     3-/5  ?Shoulder extension       ?Shoulder abduction     3-/5  ?Shoulder adduction       ?Shoulder internal rotation     3-/5  ?Shoulder external rotation     3-/5  ?Middle trapezius       ?Lower trapezius        ?Elbow flexion     4/5  ?Elbow extension     4/5  ?Wrist flexion       ?Wrist extension       ?Wrist ulnar deviation       ?Wrist radial deviation       ?Wrist pronation       ?Wrist supination       ?Grip strength (lbs)       ?(Blank rows = not tested) ?  ?  ?  ?  ?           ?TODAY'S TREATMENT:  ?[facility] Adult PT Treatment:                                                DATE: 12/08/21 ?Therapeutic Exercise: ?UBE level 1.5 x 2 min each fwd/bwd  ?Supine Lt shoulder flexion AROM 1 x 10 ?Sidelying ER AROM 2 x 10 ?Sidelying shoulder abduction AROM 2 x 10; partial range  ?Manual Therapy: ?Lt shoulder PROM in all planes to tolerance  ?Lt GHJ inferior, anterior and posterior mobilization grade II-III ? ? ? ?[facility] Adult PT Treatment:                                                DATE: 12/04/21 ?Therapeutic Exercise: ?UBE level 1 x 4 minutes fwd ?Supine shoulder flexion with stability ball 1 x 10  ?Standing ER AAROM 1 x 10 with dowel  ?Standing abduction AAROM 1 x 10 with dowel  ?Finger ladder  flexion 1 x 5  ?Shoulder isometrics: flexion 1 x 10 ; 5 sec hold  ?Manual Therapy: ?Lt shoulder PROM in all planes to tolerance  ?Lt GHJ inferior mobilization grade II-III ?Modalities: ?Ice pack to Lt shoulder x 10 minutes  ? ?[facility] Adult PT Treatment:                                                DATE: 12/02/21 ?Therapeutic Exercise: ?Towel slides at wall 1 x 10 flexion and abduction  ?UBE level 1 x 4 minutes fwd ?Shoulder isometrics 1 x 10; 5 sec hold; ER, IR  ?Updated HEP ?Manual Therapy: ?Lt shoulder PROM in all planes to tolerance  ?Lt GHJ inferior mobilization grade II-III ?Modalities: ?Ice pack to Lt shoulder x 10 minutes  ? ? ? ? ? ? ? ? ? ? ?  ?   ?PATIENT EDUCATION: ?Education details: n/a ?Person educated: n/a  ?Education method:n/a ?Education comprehension: n/a ?  ?  ?HOME EXERCISE PROGRAM: ?Access Code: [email or web address] ?URL: [email or web address] ?Date: 10/28/2021 ?Prepared by: Gwendolyn Grant ?   ?Exercises ?Flexion-Extension Shoulder Pendulum with Table Support - 3 x daily - 7 x weekly - 1 sets - 60 sec hold ?Horizontal Shoulder Pendulum with Table Support - 3 x daily - 7 x weekly - 1 sets - 60 sec hold ?Putty Squeezes - 2 x daily - 7 x weekly - 2 sets - 10 reps ?Wrist Flexion Extension AROM with Fingers Curled and Palm Down - 2 x daily - 7 x weekly - 2 sets - 10 reps ?  ?  ?ASSESSMENT: ?  ?CLINICAL IMPRESSION: ?Patient tolerated session well today with visual improvement noted in PROM. Continued to progress ROM today introducing AROM exercises, which he tolerated well reporting occasional popping in the shoulder that was not painful. With sidelying shoulder abduction he reported pain at end range, though when instructed to decrease ROM, no reports of pain.  He reported muscle fatigue at conclusion of session. He declined ice at the end of the session.  ?  ? ?  ?REHAB POTENTIAL: Good ?  ?CLINICAL DECISION MAKING: Stable/uncomplicated ?  ?EVALUATION COMPLEXITY: Low ?  ?  ?GOALS: ?Goals reviewed with patient? No ?  ?SHORT TERM GOALS: ?  ?STG Name Target Date Goal status  ?1 Patient will be independent and compliant with initial HEP.  ?  ?Baseline:  11/18/2021 Achieved   ?2 Patient will demonstrate WNL PROM of the Lt shoulder in order to progress towards next phase of rehab.  ?Baseline:  12/09/2021 Ongoing   ?  ?LONG TERM GOALS:  ?  ?LTG Name Target Date Goal status  ?1 Patient will demonstrate at least 160 degrees of Lt shoulder flexion and abduction AROM to improve ability to reach overhead ?Baseline: 01/23/22 Ongoing   ?2 Patient will demonstrate at least 50 degrees of Lt shoulder IR/ER AROM to improve ability to complete self-care activities.  ?Baseline: 01/23/22 Partially met    ?3 Patient will demonstrate at least 4/5 Lt shoulder strength to improve ability to lift items.  ?Baseline: 01/23/22 Ongoing   ?4 Patient will demonstrate 5/5 Lt elbow strength to improve ability to carry objects.  ?Baseline: 01/23/22  Ongoing   ?5 Patient will score at least 58% function on FOTO to signify clinically meaningful improvement in functional abilities.  ?11/18/2021 53% 01/23/22 Achieved    ?  ?PLAN: ?  PT FREQUENCY: 2x/week ?  ?PT DURATION: 12 weeks ?  ?PLANN

## 2021-12-09 ENCOUNTER — Other Ambulatory Visit: Payer: Self-pay

## 2021-12-09 ENCOUNTER — Ambulatory Visit: Payer: Medicare PPO

## 2021-12-09 DIAGNOSIS — M25512 Pain in left shoulder: Secondary | ICD-10-CM | POA: Diagnosis not present

## 2021-12-09 DIAGNOSIS — M6281 Muscle weakness (generalized): Secondary | ICD-10-CM

## 2021-12-10 NOTE — Therapy (Incomplete)
? ? ? ? ? ?Patient Name: Stephen Chase ?MRN: [id number] ?DOB:03/28/1966, 56 y.o., male ?Today's Date: 12/10/2021 ? ?PCP: [name], FNP ?REFERRING PROVIDER: Tania Ade, MD ? ? ? ? ? ? ? ? ? ? ? ? ? ? ?Past Medical History:  ?Diagnosis Date  ? Allergy   ? Arthritis   ? SHOULDERS  ? Balanitis   ? Deafness, sensorineural, childhood onset   ? BILATERAL SINCE AGE 50  SECONDARY TO UNKNOWN ILLNESS  ? GERD (gastroesophageal reflux disease)   ? Hyperlipidemia   ? Hypertension   ? No blood products   ? Patient is Jehovah Witness no blood products.  ? OSA on CPAP   ? SEVERE PER STUDY 03-09-2009  ? Sleep apnea   ? uses a cpap  ? Smokers' cough ([place])   ? Type 2 diabetes mellitus ([place])   ? ?Past Surgical History:  ?Procedure Laterality Date  ? CIRCUMCISION N/A 12/19/2013  ? Procedure: CIRCUMCISION ADULT;  Surgeon: Lowella Bandy, MD;  Location: [facility];  Service: Urology;  Laterality: N/A;  ? SHOULDER SURGERY Right   ? WRIST SURGERY Left   ? ?Patient Active Problem List  ? Diagnosis Date Noted  ? Hypersomnia, persistent 05/09/2018  ? Type 2 diabetes mellitus with diabetic neuropathy, without long-term current use of insulin ([place]) 05/09/2018  ? Uncontrolled type 2 diabetes mellitus with hyperglycemia ([place]) 05/09/2018  ? OSA on CPAP 05/09/2018  ? Esophageal varices without bleeding ([place]) 05/09/2018  ? Bilateral deafness 05/09/2018  ? ? ?REFERRING DIAG:  s/p left shoulder RCR ,SAD  ? ?THERAPY DIAG:  ?No diagnosis found. ? ?PERTINENT HISTORY: Bilateral deafness  ? ?PRECAUTIONS: shoulder ? ?Interpreter utilized ?SUBJECTIVE:  ? ?PAIN:  ?Are you having pain? Yes ?NPRS scale: 2/10 ?Pain location: Lt shoulder  ?PAIN TYPE: sore  ?Pain description: intermittent  ?Aggravating factors: movement ?Relieving factors: rest  ? ? ?OBJECTIVE:  ? *Unless otherwise noted all objective measures were captured on initial evaluation.  ? ?  ?  ?PATIENT SURVEYS:  ?FOTO 12% function ?11/18/2021 53% ?12/02/21: 69% ?  ?  ?POSTURE: ?Forward  head, rounded shoulders  ?  ?PALPATION: ?Diffuse tenderness about Lt shoulder  ?  ?UPPER EXTREMITY AROM/PROM: Rt shoulder AROM WNL with exception of functional IR is moderately limited.  ?  ?A/PROM Right ?10/28/2021 Left PROM ?10/28/2021 10/30/21 ?Left PROM 11/04/21 ?Left PROM 11/06/21 ?Left PROM 11/11/21 ?Left PROM 11/18/2021 ?Left PROM 11/20/21 11/27/21 ?Left PROM 12/02/21 ?Left  3/16  ?Shoulder flexion   65   120 133 128 135  PROM 145 ?AROM 140 supine   PROM: 158  ?Shoulder extension               ?Shoulder abduction   69 85 86 100 105 100 110 116 PROM 116  ?AROM 135 supine   ?Shoulder adduction               ?Shoulder internal rotation            PROM 65 ?AROM 55    ?Shoulder external rotation   '8   20   24 '$ 45 PROM 55 ?AROM 35   ?Elbow flexion               ?Elbow extension               ?Wrist flexion               ?Wrist extension               ?  Wrist ulnar deviation               ?Wrist radial deviation               ?Wrist pronation               ?Wrist supination               ?(Blank rows = not tested) ?  ?UPPER EXTREMITY MMT:  ?  ?MMT Right ?10/28/2021 Left ?10/28/2021 12/02/21 ?Left   ?Shoulder flexion     3-/5  ?Shoulder extension       ?Shoulder abduction     3-/5  ?Shoulder adduction       ?Shoulder internal rotation     3-/5  ?Shoulder external rotation     3-/5  ?Middle trapezius       ?Lower trapezius       ?Elbow flexion     4/5  ?Elbow extension     4/5  ?Wrist flexion       ?Wrist extension       ?Wrist ulnar deviation       ?Wrist radial deviation       ?Wrist pronation       ?Wrist supination       ?Grip strength (lbs)       ?(Blank rows = not tested) ?  ?  ?  ?  ?           ?TODAY'S TREATMENT:  ?[facility] Adult PT Treatment:                                                DATE: 12/11/21 ?Therapeutic Exercise: ?*** ?Manual Therapy: ?*** ?Neuromuscular re-ed: ?*** ?Therapeutic Activity: ?*** ?Modalities: ?*** ?Self Care: ?*** ? ? ?[facility] Adult PT Treatment:                                                DATE:  12/08/21 ?Therapeutic Exercise: ?UBE level 1.5 x 2 min each fwd/bwd  ?Supine Lt shoulder flexion AROM 1 x 10 ?Sidelying ER AROM 2 x 10 ?Sidelying shoulder abduction AROM 2 x 10; partial range  ?Manual Therapy: ?Lt shoulder PROM in all planes to tolerance  ?Lt GHJ inferior, anterior and posterior mobilization grade II-III ? ? ? ?[facility] Adult PT Treatment:                                                DATE: 12/04/21 ?Therapeutic Exercise: ?UBE level 1 x 4 minutes fwd ?Supine shoulder flexion with stability ball 1 x 10  ?Standing ER AAROM 1 x 10 with dowel  ?Standing abduction AAROM 1 x 10 with dowel  ?Finger ladder flexion 1 x 5  ?Shoulder isometrics: flexion 1 x 10 ; 5 sec hold  ?Manual Therapy: ?Lt shoulder PROM in all planes to tolerance  ?Lt GHJ inferior mobilization grade II-III ?Modalities: ?Ice pack to Lt shoulder x 10 minutes  ? ?[facility] Adult PT Treatment:  DATE: 12/02/21 ?Therapeutic Exercise: ?Towel slides at wall 1 x 10 flexion and abduction  ?UBE level 1 x 4 minutes fwd ?Shoulder isometrics 1 x 10; 5 sec hold; ER, IR  ?Updated HEP ?Manual Therapy: ?Lt shoulder PROM in all planes to tolerance  ?Lt GHJ inferior mobilization grade II-III ?Modalities: ?Ice pack to Lt shoulder x 10 minutes  ? ? ? ? ? ? ? ? ? ? ?  ?   ?PATIENT EDUCATION: ?Education details: n/a ?Person educated: n/a  ?Education method:n/a ?Education comprehension: n/a ?  ?  ?HOME EXERCISE PROGRAM: ?Access Code: [email or web address] ?URL: [email or web address] ?Date: 10/28/2021 ?Prepared by: Gwendolyn Grant ?  ?Exercises ?Flexion-Extension Shoulder Pendulum with Table Support - 3 x daily - 7 x weekly - 1 sets - 60 sec hold ?Horizontal Shoulder Pendulum with Table Support - 3 x daily - 7 x weekly - 1 sets - 60 sec hold ?Putty Squeezes - 2 x daily - 7 x weekly - 2 sets - 10 reps ?Wrist Flexion Extension AROM with Fingers Curled and Palm Down - 2 x daily - 7 x weekly - 2 sets - 10 reps ?  ?  ?ASSESSMENT: ?   ?CLINICAL IMPRESSION: ? ?  ? ?  ?REHAB POTENTIAL: Good ?  ?CLINICAL DECISION MAKING: Stable/uncomplicated ?  ?EVALUATION COMPLEXITY: Low ?  ?  ?GOALS: ?Goals reviewed with patient? No ?  ?SHORT TERM GOALS: ?  ?STG Name Target Date Goal status  ?1 Patient will be independent and compliant with initial HEP.  ?  ?Baseline:  11/18/2021 Achieved   ?2 Patient will demonstrate WNL PROM of the Lt shoulder in order to progress towards next phase of rehab.  ?Baseline:  12/09/2021 Ongoing   ?  ?LONG TERM GOALS:  ?  ?LTG Name Target Date Goal status  ?1 Patient will demonstrate at least 160 degrees of Lt shoulder flexion and abduction AROM to improve ability to reach overhead ?Baseline: 01/23/22 Ongoing   ?2 Patient will demonstrate at least 50 degrees of Lt shoulder IR/ER AROM to improve ability to complete self-care activities.  ?Baseline: 01/23/22 Partially met    ?3 Patient will demonstrate at least 4/5 Lt shoulder strength to improve ability to lift items.  ?Baseline: 01/23/22 Ongoing   ?4 Patient will demonstrate 5/5 Lt elbow strength to improve ability to carry objects.  ?Baseline: 01/23/22 Ongoing   ?5 Patient will score at least 58% function on FOTO to signify clinically meaningful improvement in functional abilities.  ?11/18/2021 53% 01/23/22 Achieved    ?  ?PLAN: ?PT FREQUENCY: 2x/week ?  ?PT DURATION: 12 weeks ?  ?PLANNED INTERVENTIONS: Therapeutic exercises, Therapeutic activity, Neuro Muscular re-education, Patient/Family education, Joint mobilization, Aquatic Therapy, Dry Needling, Cryotherapy, Moist heat, Vasopneumatic device, Ultrasound, and Manual therapy ?  ?PLAN FOR NEXT SESSION:  progress per post-op protocol; progress note in order to request additional auth  ?  ?Gwendolyn Grant, PT, DPT, ATC ?12/10/21 4:00 PM ? ? ? ?

## 2021-12-11 ENCOUNTER — Ambulatory Visit: Payer: Medicare PPO

## 2021-12-16 ENCOUNTER — Ambulatory Visit: Payer: Medicare PPO

## 2021-12-17 NOTE — Therapy (Deleted)
? ? ? ? ? ?Patient Name: Stephen Chase ?MRN: [id number] ?DOB:[birth year], 56 y.o., male ?Today's Date: 12/17/2021 ? ?PCP: [name], FNP ?REFERRING PROVIDER: Tania Ade, MD ? ? ? ? ? ? ? ? ? ? ? ? ? ? ?Past Medical History:  ?Diagnosis Date  ? Allergy   ? Arthritis   ? SHOULDERS  ? Balanitis   ? Deafness, sensorineural, childhood onset   ? BILATERAL SINCE AGE 30  SECONDARY TO UNKNOWN ILLNESS  ? GERD (gastroesophageal reflux disease)   ? Hyperlipidemia   ? Hypertension   ? No blood products   ? Patient is Jehovah Witness no blood products.  ? OSA on CPAP   ? SEVERE PER STUDY 03-09-2009  ? Sleep apnea   ? uses a cpap  ? Smokers' cough ([place])   ? Type 2 diabetes mellitus ([place])   ? ?Past Surgical History:  ?Procedure Laterality Date  ? CIRCUMCISION N/A 12/19/2013  ? Procedure: CIRCUMCISION ADULT;  Surgeon: Lowella Bandy, MD;  Location: [facility];  Service: Urology;  Laterality: N/A;  ? SHOULDER SURGERY Right   ? WRIST SURGERY Left   ? ?Patient Active Problem List  ? Diagnosis Date Noted  ? Hypersomnia, persistent 05/09/2018  ? Type 2 diabetes mellitus with diabetic neuropathy, without long-term current use of insulin ([place]) 05/09/2018  ? Uncontrolled type 2 diabetes mellitus with hyperglycemia ([place]) 05/09/2018  ? OSA on CPAP 05/09/2018  ? Esophageal varices without bleeding ([place]) 05/09/2018  ? Bilateral deafness 05/09/2018  ? ? ?REFERRING DIAG:  s/p left shoulder RCR ,SAD  ? ?THERAPY DIAG:  ?No diagnosis found. ? ?PERTINENT HISTORY: Bilateral deafness  ? ?PRECAUTIONS: shoulder ? ?Interpreter utilized ?SUBJECTIVE:  ?*** ?PAIN:  ?Are you having pain? Yes ?NPRS scale: 2/10 ?Pain location: Lt shoulder  ?PAIN TYPE: sore  ?Pain description: intermittent  ?Aggravating factors: movement ?Relieving factors: rest  ? ? ?OBJECTIVE:  ? *Unless otherwise noted all objective measures were captured on initial evaluation.  ? ?  ?  ?PATIENT SURVEYS:  ?FOTO 12% function ?11/18/2021 53% ?12/02/21: 69% ?  ?  ?POSTURE: ?Forward  head, rounded shoulders  ?  ?PALPATION: ?Diffuse tenderness about Lt shoulder  ?  ?UPPER EXTREMITY AROM/PROM: Rt shoulder AROM WNL with exception of functional IR is moderately limited.  ?  ?A/PROM Right ?10/28/2021 Left PROM ?10/28/2021 10/30/21 ?Left PROM 11/04/21 ?Left PROM 11/06/21 ?Left PROM 11/11/21 ?Left PROM 11/18/2021 ?Left PROM 11/20/21 11/27/21 ?Left PROM 12/02/21 ?Left  3/16  ?Shoulder flexion   65   120 133 128 135  PROM 145 ?AROM 140 supine   PROM: 158  ?Shoulder extension               ?Shoulder abduction   69 85 86 100 105 100 110 116 PROM 116  ?AROM 135 supine   ?Shoulder adduction               ?Shoulder internal rotation            PROM 65 ?AROM 55    ?Shoulder external rotation   _0 45 PROM 55 ?AROM 35   ?Elbow flexion               ?Elbow extension               ?Wrist flexion               ?Wrist extension               ?  Wrist ulnar deviation               ?Wrist radial deviation               ?Wrist pronation               ?Wrist supination               ?(Blank rows = not tested) ?  ?UPPER EXTREMITY MMT:  ?  ?MMT Right ?10/28/2021 Left ?10/28/2021 12/02/21 ?Left   ?Shoulder flexion     3-/5  ?Shoulder extension       ?Shoulder abduction     3-/5  ?Shoulder adduction       ?Shoulder internal rotation     3-/5  ?Shoulder external rotation     3-/5  ?Middle trapezius       ?Lower trapezius       ?Elbow flexion     4/5  ?Elbow extension     4/5  ?Wrist flexion       ?Wrist extension       ?Wrist ulnar deviation       ?Wrist radial deviation       ?Wrist pronation       ?Wrist supination       ?Grip strength (lbs)       ?(Blank rows = not tested) ?  ?  ?  ?  ?           ?TODAY'S TREATMENT:  ? ?[facility] Adult PT Treatment:                                                DATE: 12/18/2021 ?Therapeutic Exercise: ?*** ?Manual Therapy: ?*** ?Neuromuscular re-ed: ?*** ?Therapeutic Activity: ?*** ?Modalities: ?*** ?Self Care: ?*** ? ? ?[facility] Adult PT Treatment:                                                DATE:  12/08/21 ?Therapeutic Exercise: ?UBE level 1.5 x 2 min each fwd/bwd  ?Supine Lt shoulder flexion AROM 1 x 10 ?Sidelying ER AROM 2 x 10 ?Sidelying shoulder abduction AROM 2 x 10; partial range  ?Manual Therapy: ?Lt shoulder PROM in all planes to tolerance  ?Lt GHJ inferior, anterior and posterior mobilization grade II-III ? ? ? ?[facility] Adult PT Treatment:                                                DATE: 12/04/21 ?Therapeutic Exercise: ?UBE level 1 x 4 minutes fwd ?Supine shoulder flexion with stability ball 1 x 10  ?Standing ER AAROM 1 x 10 with dowel  ?Standing abduction AAROM 1 x 10 with dowel  ?Finger ladder flexion 1 x 5  ?Shoulder isometrics: flexion 1 x 10 ; 5 sec hold  ?Manual Therapy: ?Lt shoulder PROM in all planes to tolerance  ?Lt GHJ inferior mobilization grade II-III ?Modalities: ?Ice pack to Lt shoulder x 10 minutes  ? ? ?  ?   ?PATIENT EDUCATION: ?Education details: n/a ?Person educated: n/a  ?Education method:n/a ?Education comprehension: n/a ?  ?  ?  HOME EXERCISE PROGRAM: ?Access Code: [email or web address] ?URL: [email or web address] ?Date: 10/28/2021 ?Prepared by: Gwendolyn Grant ?  ?Exercises ?Flexion-Extension Shoulder Pendulum with Table Support - 3 x daily - 7 x weekly - 1 sets - 60 sec hold ?Horizontal Shoulder Pendulum with Table Support - 3 x daily - 7 x weekly - 1 sets - 60 sec hold ?Putty Squeezes - 2 x daily - 7 x weekly - 2 sets - 10 reps ?Wrist Flexion Extension AROM with Fingers Curled and Palm Down - 2 x daily - 7 x weekly - 2 sets - 10 reps ?  ?  ?ASSESSMENT: ?  ?CLINICAL IMPRESSION: ?*** ?  ? ?  ?REHAB POTENTIAL: Good ?  ?CLINICAL DECISION MAKING: Stable/uncomplicated ?  ?EVALUATION COMPLEXITY: Low ?  ?  ?GOALS: ?Goals reviewed with patient? No ?  ?SHORT TERM GOALS: ?  ?STG Name Target Date Goal status  ?1 Patient will be independent and compliant with initial HEP.  ?  ?Baseline:  11/18/2021 Achieved   ?2 Patient will demonstrate WNL PROM of the Lt shoulder in order to progress towards  next phase of rehab.  ?Baseline:  12/09/2021 Ongoing   ?  ?LONG TERM GOALS:  ?  ?LTG Name Target Date Goal status  ?1 Patient will demonstrate at least 160 degrees of Lt shoulder flexion and abduction AROM to improve ability to reach overhead ?Baseline: 01/23/22 Ongoing   ?2 Patient will demonstrate at least 50 degrees of Lt shoulder IR/ER AROM to improve ability to complete self-care activities.  ?Baseline: 01/23/22 Partially met    ?3 Patient will demonstrate at least 4/5 Lt shoulder strength to improve ability to lift items.  ?Baseline: 01/23/22 Ongoing   ?4 Patient will demonstrate 5/5 Lt elbow strength to improve ability to carry objects.  ?Baseline: 01/23/22 Ongoing   ?5 Patient will score at least 58% function on FOTO to signify clinically meaningful improvement in functional abilities.  ?11/18/2021 53% 01/23/22 Achieved    ?  ?PLAN: ?PT FREQUENCY: 2x/week ?  ?PT DURATION: 12 weeks ?  ?PLANNED INTERVENTIONS: Therapeutic exercises, Therapeutic activity, Neuro Muscular re-education, Patient/Family education, Joint mobilization, Aquatic Therapy, Dry Needling, Cryotherapy, Moist heat, Vasopneumatic device, Ultrasound, and Manual therapy ?  ?PLAN FOR NEXT SESSION:  progress per post-op protocol; progress note in order to request additional auth  ?  ? ?Vanessa Kila, PT, DPT ?12/17/21 10:41 AM ? ? ? ? ?

## 2021-12-18 ENCOUNTER — Ambulatory Visit: Payer: Medicare PPO

## 2021-12-22 NOTE — Therapy (Signed)
?OUTPATIENT PHYSICAL THERAPY TREATMENT NOTE/ RE-AUTHORIZATION ? ? ? ? ?Patient Name: Stephen Chase ?MRN: [id number] ?DOB:[birth year], 56 y.o., male ?Today's Date: 12/23/2021 ? ?PCP: [name], FNP ?REFERRING PROVIDER: [name], FNP ? ? PT End of Session - 12/23/21 1034   ? ? Visit Number 13   ? Number of Visits 25   ? Date for PT Re-Evaluation 01/24/22   ? Authorization Type [employer] MCR-   ? Authorization Time Period 2/8-3/24 (Pending additional auth)   ? Progress Note Due on Visit 20   ? PT Start Time 1000   ? PT Stop Time 1045   ? PT Time Calculation (min) 45 min   ? Activity Tolerance Patient tolerated treatment well   ? Behavior During Therapy [facility] for tasks assessed/performed   ? ?  ?  ? ?  ? ? ? ? ? ? ? ? ? ? ? ? ? ?Past Medical History:  ?Diagnosis Date  ? Allergy   ? Arthritis   ? SHOULDERS  ? Balanitis   ? Deafness, sensorineural, childhood onset   ? BILATERAL SINCE AGE 45  SECONDARY TO UNKNOWN ILLNESS  ? GERD (gastroesophageal reflux disease)   ? Hyperlipidemia   ? Hypertension   ? No blood products   ? Patient is Jehovah Witness no blood products.  ? OSA on CPAP   ? SEVERE PER STUDY 03-09-2009  ? Sleep apnea   ? uses a cpap  ? Smokers' cough ([place])   ? Type 2 diabetes mellitus ([place])   ? ?Past Surgical History:  ?Procedure Laterality Date  ? CIRCUMCISION N/A 12/19/2013  ? Procedure: CIRCUMCISION ADULT;  Surgeon: Lowella Bandy, MD;  Location: [facility];  Service: Urology;  Laterality: N/A;  ? SHOULDER SURGERY Right   ? WRIST SURGERY Left   ? ?Patient Active Problem List  ? Diagnosis Date Noted  ? Hypersomnia, persistent 05/09/2018  ? Type 2 diabetes mellitus with diabetic neuropathy, without long-term current use of insulin ([place]) 05/09/2018  ? Uncontrolled type 2 diabetes mellitus with hyperglycemia ([place]) 05/09/2018  ? OSA on CPAP 05/09/2018  ? Esophageal varices without bleeding ([place]) 05/09/2018  ? Bilateral deafness 05/09/2018  ? ? ?REFERRING DIAG:  s/p left shoulder RCR ,SAD  ? ?THERAPY DIAG:   ?Acute pain of left shoulder ? ?Muscle weakness (generalized) ? ?PERTINENT HISTORY: Bilateral deafness  ? ?PRECAUTIONS: shoulder ? ?Interpreter utilized ?SUBJECTIVE:  ?Pt reports that he has been feeling well, adding that his HEP has been going well. He rates his Lt shoulder pain is about 2/10 today. 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 ? ?PAIN:  ?Are you having pain? Yes ?NPRS scale: 2/10 ?Pain location: Lt shoulder  ?PAIN TYPE: sore  ?Pain description: intermittent  ?Aggravating factors: movement ?Relieving factors: rest  ? ? ?OBJECTIVE:  ? *Unless otherwise noted all objective measures were captured on initial evaluation.  ? ?  ?  ?PATIENT SURVEYS:  ?FOTO 12% function ?11/18/2021 53% ?12/02/21: 69% ?  ?  ?POSTURE: ?Forward head, rounded shoulders  ?  ?PALPATION: ?Diffuse tenderness about Lt shoulder  ?  ?UPPER EXTREMITY AROM/PROM: Rt shoulder AROM WNL with exception of functional IR is moderately limited.  ?  ?A/PROM Right ?10/28/2021 Left PROM ?10/28/2021 10/30/21 ?Left PROM 11/04/21 ?Left PROM 11/06/21 ?Left PROM 11/11/21 ?Left PROM 11/18/2021 ?Left PROM 11/20/21 11/27/21 ?Left PROM 12/02/21 ?Left  3/16 12/23/2021 ?Left  ?Shoulder flexion   65   120 133 128 135  PROM 145 ?AROM 140  supine   PROM: 158 165 AROM ?170 PROM  ?Shoulder extension                ?Shoulder abduction   69 85 86 100 105 100 110 116 PROM 116  ?AROM 135 supine  140 AROM ?155 PROM  ?Shoulder adduction                ?Shoulder internal rotation            PROM 65 ?AROM 55   65 AROM ?75 PROM  ?Shoulder external rotation   '8   20   24 '$ 45 PROM 55 ?AROM 35  45 AROM ?47 PROM  ?Elbow flexion                ?Elbow extension                ?Wrist flexion                ?Wrist extension                ?Wrist ulnar deviation                ?Wrist radial deviation                ?Wrist pronation                ?Wrist supination                ?(Blank rows = not tested) ?  ?UPPER EXTREMITY MMT:  ?  ?MMT Right ?10/28/2021  Left ?10/28/2021 12/02/21 ?Left  12/23/2021  ?Shoulder flexion     3-/5 4+/5 minor p!  ?Shoulder extension        ?Shoulder abduction     3-/5 5/5 minor p!  ?Shoulder adduction        ?Shoulder internal rotation     3-/5 5/5 minor p!  ?Shoulder external rotation     3-/5 5/5  ?Middle trapezius        ?Lower trapezius        ?Elbow flexion     4/5 5/5  ?Elbow extension     4/5 5/5  ?Wrist flexion        ?Wrist extension        ?Wrist ulnar deviation        ?Wrist radial deviation        ?Wrist pronation        ?Wrist supination        ?Grip strength (lbs)        ?(Blank rows = not tested) ?  ?  ?  ?  ?           ?TODAY'S TREATMENT:  ? ?[place] Adult PT Treatment:                                                DATE: 12/22/2021 ?Therapeutic Exercise: ?Black physioball roll-up with gentle PT perturbations at end-range 6x15 seconds ?Standing shoulder abduction isometric at doorway 2x10 with 5-sec holds ?Standing shoulder flexion isometric at doorway 2x10 with 5-sec hold ?Manual Therapy: ?Lt shoulder PROM with gentle distraction/ vibration in all planes ?Neuromuscular re-ed: ?N/A ?Therapeutic Activity: ?Re-assessment of objective measures with pt education on progress made in PT ?Modalities: ?N/A ?Self Care: ?N/A ? ? ?[place] Adult PT Treatment:  DATE: 12/08/21 ?Therapeutic Exercise: ?UBE level 1.5 x 2 min each fwd/bwd  ?Supine Lt shoulder flexion AROM 1 x 10 ?Sidelying ER AROM 2 x 10 ?Sidelying shoulder abduction AROM 2 x 10; partial range  ?Manual Therapy: ?Lt shoulder PROM in all planes to tolerance  ?Lt GHJ inferior, anterior and posterior mobilization grade II-III ? ? ? ?[facility] Adult PT Treatment:                                                DATE: 12/04/21 ?Therapeutic Exercise: ?UBE level 1 x 4 minutes fwd ?Supine shoulder flexion with stability ball 1 x 10  ?Standing ER AAROM 1 x 10 with dowel  ?Standing abduction AAROM 1 x 10 with dowel  ?Finger ladder flexion 1 x 5  ?Shoulder  isometrics: flexion 1 x 10 ; 5 sec hold  ?Manual Therapy: ?Lt shoulder PROM in all planes to tolerance  ?Lt GHJ inferior mobilization grade II-III ?Modalities: ?Ice pack to Lt shoulder x 10 minutes  ? ? ?  ?   ?PATIENT EDUCATION: ?Education details: n/a ?Person educated: n/a  ?Education method:n/a ?Education comprehension: n/a ?  ?  ?HOME EXERCISE PROGRAM: ?Access Code: [email or web address] ?URL: [email or web address] ?Date: 10/28/2021 ?Prepared by: Gwendolyn Grant ?  ?Exercises ?Flexion-Extension Shoulder Pendulum with Table Support - 3 x daily - 7 x weekly - 1 sets - 60 sec hold ?Horizontal Shoulder Pendulum with Table Support - 3 x daily - 7 x weekly - 1 sets - 60 sec hold ?Putty Squeezes - 2 x daily - 7 x weekly - 2 sets - 10 reps ?Wrist Flexion Extension AROM with Fingers Curled and Palm Down - 2 x daily - 7 x weekly - 2 sets - 10 reps ? ?Added 12/23/2021: ?- Standing Isometric Shoulder Abduction with Doorway - Arm Bent *50% FORCE*  - 1 x daily - 7 x weekly - 2 sets - 10 reps - 5-sec hold ?- Standing Isometric Shoulder Flexion with Doorway - Arm Bent  - 1 x daily - 7 x weekly - 2 sets - 10 reps - 5-sec hold ?  ?  ?ASSESSMENT: ?  ?CLINICAL IMPRESSION: ?Pt continues to progress well with PT, making progress in his global ROM and strength, although he continues to have limitations in these areas due to pain. He responses well to all progressed interventions today and will continue to benefit from skilled PT to address his primary impairments and return to his prior level of function with less limitation. ?  ? ?  ?REHAB POTENTIAL: Good ?  ?CLINICAL DECISION MAKING: Stable/uncomplicated ?  ?EVALUATION COMPLEXITY: Low ?  ?  ?GOALS: ?Goals reviewed with patient? No ?  ?SHORT TERM GOALS: ?  ?STG Name Target Date Goal status  ?1 Patient will be independent and compliant with initial HEP.  ?  ?Baseline:  11/18/2021 Achieved   ?2 Patient will demonstrate WNL PROM of the Lt shoulder in order to progress towards next phase of  rehab.  ?Baseline:  12/09/2021 Ongoing   ?  ?LONG TERM GOALS:  ?  ?LTG Name Target Date Goal status  ?1 Patient will demonstrate at least 160 degrees of Lt shoulder flexion and abduction AROM to improve abil

## 2021-12-23 ENCOUNTER — Ambulatory Visit: Payer: Medicare PPO | Attending: Orthopedic Surgery

## 2021-12-23 DIAGNOSIS — M25512 Pain in left shoulder: Secondary | ICD-10-CM

## 2021-12-23 DIAGNOSIS — M6281 Muscle weakness (generalized): Secondary | ICD-10-CM | POA: Diagnosis not present

## 2021-12-24 ENCOUNTER — Encounter: Payer: Medicare PPO | Admitting: Nurse Practitioner

## 2021-12-25 ENCOUNTER — Ambulatory Visit: Payer: Medicare PPO

## 2021-12-30 ENCOUNTER — Ambulatory Visit: Payer: Medicare PPO

## 2021-12-30 DIAGNOSIS — M25512 Pain in left shoulder: Secondary | ICD-10-CM

## 2021-12-30 DIAGNOSIS — M6281 Muscle weakness (generalized): Secondary | ICD-10-CM

## 2021-12-30 NOTE — Therapy (Signed)
?OUTPATIENT PHYSICAL THERAPY TREATMENT NOTE/ RE-AUTHORIZATION ? ? ? ? ?Patient Name: Stephen Chase ?MRN: [id number] ?DOB:06-10-1966, 56 y.o., male ?Today's Date: 12/30/2021 ? ?PCP: [name], FNP ?REFERRING PROVIDER: [name], FNP ? ? PT End of Session - 12/30/21 0957   ? ? Visit Number 14   ? Number of Visits 25   ? Date for PT Re-Evaluation 01/24/22   ? Authorization Type [employer] MCR-   ? Authorization Time Period 2/8-3/24 (Pending additional auth)   ? Progress Note Due on Visit 20   ? PT Start Time 1000   ? PT Stop Time 1043   ? PT Time Calculation (min) 43 min   ? Activity Tolerance Patient tolerated treatment well   ? Behavior During Therapy [facility] for tasks assessed/performed   ? ?  ?  ? ?  ? ? ? ? ? ? ? ? ? ? ? ? ? ? ?Past Medical History:  ?Diagnosis Date  ? Allergy   ? Arthritis   ? SHOULDERS  ? Balanitis   ? Deafness, sensorineural, childhood onset   ? BILATERAL SINCE AGE 70  SECONDARY TO UNKNOWN ILLNESS  ? GERD (gastroesophageal reflux disease)   ? Hyperlipidemia   ? Hypertension   ? No blood products   ? Patient is Jehovah Witness no blood products.  ? OSA on CPAP   ? SEVERE PER STUDY 03-09-2009  ? Sleep apnea   ? uses a cpap  ? Smokers' cough ([place])   ? Type 2 diabetes mellitus ([place])   ? ?Past Surgical History:  ?Procedure Laterality Date  ? CIRCUMCISION N/A 12/19/2013  ? Procedure: CIRCUMCISION ADULT;  Surgeon: Lowella Bandy, MD;  Location: [facility];  Service: Urology;  Laterality: N/A;  ? SHOULDER SURGERY Right   ? WRIST SURGERY Left   ? ?Patient Active Problem List  ? Diagnosis Date Noted  ? Hypersomnia, persistent 05/09/2018  ? Type 2 diabetes mellitus with diabetic neuropathy, without long-term current use of insulin ([place]) 05/09/2018  ? Uncontrolled type 2 diabetes mellitus with hyperglycemia ([place]) 05/09/2018  ? OSA on CPAP 05/09/2018  ? Esophageal varices without bleeding ([place]) 05/09/2018  ? Bilateral deafness 05/09/2018  ? ? ?REFERRING DIAG:  s/p left shoulder RCR ,SAD  ? ?THERAPY  DIAG:  ?Acute pain of left shoulder ? ?Muscle weakness (generalized) ? ?PERTINENT HISTORY: Bilateral deafness  ? ?PRECAUTIONS: shoulder ? ?Interpreter utilized ?SUBJECTIVE:  ?Pt reports that he has been working more recently, involving climbing ladders and cutting trees/ hedging bushes, as well as gathering limbs and loading into his truck. He adds that he has been adherent to his HEP. He reports just minor Lt shoulder pain, but his pain is most prevalent with internal rotation. He also reports some tightness when driving and turning his steering wheel. ? ?PAIN:  ?Are you having pain? Yes ?NPRS scale: 0/10 ?Pain location: Lt shoulder  ?PAIN TYPE: sore  ?Pain description: intermittent  ?Aggravating factors: movement ?Relieving factors: rest  ? ? ?OBJECTIVE:  ? *Unless otherwise noted all objective measures were captured on initial evaluation.  ? ?  ?  ?PATIENT SURVEYS:  ?FOTO 12% function ?11/18/2021 53% ?12/02/21: 69% ?  ?  ?POSTURE: ?Forward head, rounded shoulders  ?  ?PALPATION: ?Diffuse tenderness about Lt shoulder  ?  ?UPPER EXTREMITY AROM/PROM: Rt shoulder AROM WNL with exception of functional IR is moderately limited.  ?  ?A/PROM Right ?10/28/2021 Left PROM ?10/28/2021 10/30/21 ?Left PROM 11/04/21 ?Left PROM 11/06/21 ?Left PROM 11/11/21 ?Left PROM 11/18/2021 ?Left PROM  11/20/21 11/27/21 ?Left PROM 12/02/21 ?Left  3/16 12/23/2021 ?Left  ?Shoulder flexion   65   120 133 128 135  PROM 145 ?AROM 140 supine   PROM: 158 165 AROM ?170 PROM  ?Shoulder extension                ?Shoulder abduction   69 85 86 100 105 100 110 116 PROM 116  ?AROM 135 supine  140 AROM ?155 PROM  ?Shoulder adduction                ?Shoulder internal rotation            PROM 65 ?AROM 55   65 AROM ?75 PROM  ?Shoulder external rotation   _0 45 PROM 55 ?AROM 35  45 AROM ?47 PROM  ?Elbow flexion                ?Elbow extension                ?Wrist flexion                ?Wrist extension                ?Wrist ulnar deviation                ?Wrist radial  deviation                ?Wrist pronation                ?Wrist supination                ?(Blank rows = not tested) ?  ?UPPER EXTREMITY MMT:  ?  ?MMT Right ?10/28/2021 Left ?10/28/2021 12/02/21 ?Left  12/23/2021 12/30/2021 ?Right 12/30/2021 ?Left  ?Shoulder flexion     3-/5 4+/5 minor p!    ?Shoulder extension          ?Shoulder abduction     3-/5 5/5 minor p!    ?Shoulder adduction          ?Shoulder internal rotation     3-/5 5/5 minor p!    ?Shoulder external rotation     3-/5 5/5    ?Middle trapezius          ?Lower trapezius          ?Elbow flexion     4/5 5/5    ?Elbow extension     4/5 5/5    ?Wrist flexion          ?Wrist extension          ?Wrist ulnar deviation          ?Wrist radial deviation          ?Wrist pronation          ?Wrist supination          ?Grip strength (lbs)       85 96  ?(Blank rows = not tested) ?  ?  ?  ?  ?           ?TODAY'S TREATMENT:  ? ?[place] Adult PT Treatment:                                                DATE: 12/30/2021 ?Therapeutic Exercise: ?Seated BIL shoulder ER with RTB 2x10 with 3-sec hold ?  Lt shoulder UE Ranger overhead flexion AAROM with GTB resistance into adduction and abduction 2x5 with 10-sec hold BIL ?Seated low rows with 35# 2x10 ?Seated high rows with 35# 2x10 ?Manual Therapy: ?Lt shoulder PROM with gentle distraction/ vibration in all planes ?Supine Lt shoulder IR contract/ relax MET x3 with holds at end range ?Neuromuscular re-ed: ?N/A ?Therapeutic Activity: ?N/A ?Modalities: ?N/A ?Self Care: ?N/A ? ? ?[place] Adult PT Treatment:                                                DATE: 12/22/2021 ?Therapeutic Exercise: ?Black physioball roll-up with gentle PT perturbations at end-range 6x15 seconds ?Standing shoulder abduction isometric at doorway 2x10 with 5-sec holds ?Standing shoulder flexion isometric at doorway 2x10 with 5-sec hold ?Manual Therapy: ?Lt shoulder PROM with gentle distraction/ vibration in all planes ?Neuromuscular re-ed: ?N/A ?Therapeutic  Activity: ?Re-assessment of objective measures with pt education on progress made in PT ?Modalities: ?N/A ?Self Care: ?N/A ? ? ?[place] Adult PT Treatment:                                                DATE: 12/08/21 ?Therapeutic Exercise: ?UBE level 1.5 x 2 min each fwd/bwd  ?Supine Lt shoulder flexion AROM 1 x 10 ?Sidelying ER AROM 2 x 10 ?Sidelying shoulder abduction AROM 2 x 10; partial range  ?Manual Therapy: ?Lt shoulder PROM in all planes to tolerance  ?Lt GHJ inferior, anterior and posterior mobilization grade II-III ? ? ? ?  ?   ?PATIENT EDUCATION: ?Education details: n/a ?Person educated: n/a  ?Education method:n/a ?Education comprehension: n/a ?  ?  ?HOME EXERCISE PROGRAM: ?Access Code: [email or web address] ?URL: [email or web address] ?Date: 10/28/2021 ?Prepared by: Gwendolyn Grant ?  ?Exercises ?Flexion-Extension Shoulder Pendulum with Table Support - 3 x daily - 7 x weekly - 1 sets - 60 sec hold ?Horizontal Shoulder Pendulum with Table Support - 3 x daily - 7 x weekly - 1 sets - 60 sec hold ?Putty Squeezes - 2 x daily - 7 x weekly - 2 sets - 10 reps ?Wrist Flexion Extension AROM with Fingers Curled and Palm Down - 2 x daily - 7 x weekly - 2 sets - 10 reps ? ?Added 12/23/2021: ?- Standing Isometric Shoulder Abduction with Doorway - Arm Bent *50% FORCE*  - 1 x daily - 7 x weekly - 2 sets - 10 reps - 5-sec hold ?- Standing Isometric Shoulder Flexion with Doorway - Arm Bent  - 1 x daily - 7 x weekly - 2 sets - 10 reps - 5-sec hold ?  ?  ?ASSESSMENT: ?  ?CLINICAL IMPRESSION: ?Pt responded well to all interventions today, demonstrating good form and no increase in pain with performed exercises. Of note, he tolerated banded ER well today and will benefit from progressing from isometric to concentric strengthening within his shoulder protocol. He will continue  ?  ? ?  ?REHAB POTENTIAL: Good ?  ?CLINICAL DECISION MAKING: Stable/uncomplicated ?  ?EVALUATION COMPLEXITY: Low ?  ?  ?GOALS: ?Goals reviewed with patient?  No ?  ?SHORT TERM GOALS: ?  ?STG Name Target Date Goal status  ?1 Patient will be independent and compliant with initial HEP.  ?  ?Baseline:  11/18/2021 Achieved   ?  2 Patient will demonstrate WNL PROM of the Lt shoulder in or

## 2022-01-01 ENCOUNTER — Ambulatory Visit: Payer: Medicare PPO

## 2022-01-06 ENCOUNTER — Other Ambulatory Visit: Payer: Self-pay | Admitting: Nurse Practitioner

## 2022-01-06 ENCOUNTER — Ambulatory Visit: Payer: Medicare PPO

## 2022-01-06 DIAGNOSIS — M25512 Pain in left shoulder: Secondary | ICD-10-CM | POA: Diagnosis not present

## 2022-01-06 DIAGNOSIS — M6281 Muscle weakness (generalized): Secondary | ICD-10-CM

## 2022-01-06 NOTE — Therapy (Signed)
?OUTPATIENT PHYSICAL THERAPY TREATMENT NOTE ? ? ? ? ?Patient Name: Stephen Chase ?MRN: [id number] ?DOB:[birth year], 56 y.o., male ?Today's Date: 01/06/2022 ? ?PCP: [name], FNP ?REFERRING PROVIDER: [name], FNP ? ? PT End of Session - 01/06/22 0958   ? ? Visit Number 15   ? Number of Visits 25   ? Date for PT Re-Evaluation 01/24/22   ? Authorization Type [employer] MCR-   ? Authorization Time Period 2/8-3/24 (Pending additional auth)   ? Authorization - Visit Number 12   ? Authorization - Number of Visits 12   ? Progress Note Due on Visit 20   ? PT Start Time 1000   ? PT Stop Time 1045   ? PT Time Calculation (min) 45 min   ? Activity Tolerance Patient tolerated treatment well   ? Behavior During Therapy [facility] for tasks assessed/performed   ? ?  ?  ? ?  ? ? ? ? ? ? ? ? ? ? ? ? ? ? ? ?Past Medical History:  ?Diagnosis Date  ? Allergy   ? Arthritis   ? SHOULDERS  ? Balanitis   ? Deafness, sensorineural, childhood onset   ? BILATERAL SINCE AGE 80  SECONDARY TO UNKNOWN ILLNESS  ? GERD (gastroesophageal reflux disease)   ? Hyperlipidemia   ? Hypertension   ? No blood products   ? Patient is Jehovah Witness no blood products.  ? OSA on CPAP   ? SEVERE PER STUDY 03-09-2009  ? Sleep apnea   ? uses a cpap  ? Smokers' cough ([facility])   ? Type 2 diabetes mellitus ([facility])   ? ?Past Surgical History:  ?Procedure Laterality Date  ? CIRCUMCISION N/A 12/19/2013  ? Procedure: CIRCUMCISION ADULT;  Surgeon: Su Grand, MD;  Location: [facility];  Service: Urology;  Laterality: N/A;  ? SHOULDER SURGERY Right   ? WRIST SURGERY Left   ? ?Patient Active Problem List  ? Diagnosis Date Noted  ? Hypersomnia, persistent 05/09/2018  ? Type 2 diabetes mellitus with diabetic neuropathy, without long-term current use of insulin ([facility]) 05/09/2018  ? Uncontrolled type 2 diabetes mellitus with hyperglycemia ([facility]) 05/09/2018  ? OSA on CPAP 05/09/2018  ? Esophageal varices without bleeding ([facility]) 05/09/2018  ? Bilateral deafness 05/09/2018   ? ? ?REFERRING DIAG:  s/p left shoulder RCR ,SAD  ? ?THERAPY DIAG:  ?Acute pain of left shoulder ? ?Muscle weakness (generalized) ? ?PERTINENT HISTORY: Bilateral deafness  ? ?PRECAUTIONS: shoulder ? ?Interpreter utilized ?SUBJECTIVE:  ?Pt reports no pain today, only reporting 1-2/10 pain at worst when doing heavy work, usually the day after. 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  He also reports doing his HEP regularly. ? ?PAIN:  ?Are you having pain? Yes ?NPRS scale: 0/10 ?Pain location: Lt shoulder  ?PAIN TYPE: sore  ?Pain description: intermittent  ?Aggravating factors: movement ?Relieving factors: rest  ? ? ?OBJECTIVE:  ? *Unless otherwise noted all objective measures were captured on initial evaluation.  ? ?  ?  ?PATIENT SURVEYS:  ?FOTO 12% function ?11/18/2021 53% ?12/02/21: 69% ?  ?  ?POSTURE: ?Forward head, rounded shoulders  ?  ?PALPATION: ?Diffuse tenderness about Lt shoulder  ?  ?UPPER EXTREMITY AROM/PROM: Rt shoulder AROM WNL with exception of functional IR is moderately limited.  ?  ?A/PROM Right ?10/28/2021 Left PROM ?10/28/2021 10/30/21 ?Left PROM 11/04/21 ?Left PROM 11/06/21 ?Left PROM 11/11/21 ?Left PROM 11/18/2021 ?Left PROM 11/20/21 11/27/21 ?Left PROM 12/02/21 ?Left  3/16 12/23/2021 ?Left  ?Shoulder flexion   65   120 133 128 135  PROM 145 ?AROM 140 supine   PROM: 158 165 AROM ?170 PROM  ?Shoulder extension                ?Shoulder abduction   69 85 86 100 105 100 110 116 PROM 116  ?AROM 135 supine  140 AROM ?155 PROM  ?Shoulder adduction                ?Shoulder internal rotation            PROM 65 ?AROM 55   65 AROM ?75 PROM  ?Shoulder external rotation   '8   20   24 '$ 45 PROM 55 ?AROM 35  45 AROM ?47 PROM  ?Elbow flexion                ?Elbow extension                ?Wrist flexion                ?Wrist extension                ?Wrist ulnar deviation                ?Wrist radial deviation                ?Wrist pronation                ?Wrist supination                ?(Blank  rows = not tested) ?  ?UPPER EXTREMITY MMT:  ?  ?MMT Right ?10/28/2021 Left ?10/28/2021 12/02/21 ?Left  12/23/2021 12/30/2021 ?Right 12/30/2021 ?Left  ?Shoulder flexion     3-/5 4+/5 minor p!    ?Shoulder extension          ?Shoulder abduction     3-/5 5/5 minor p!    ?Shoulder adduction          ?Shoulder internal rotation     3-/5 5/5 minor p!    ?Shoulder external rotation     3-/5 5/5    ?Middle trapezius          ?Lower trapezius          ?Elbow flexion     4/5 5/5    ?Elbow extension     4/5 5/5    ?Wrist flexion          ?Wrist extension          ?Wrist ulnar deviation          ?Wrist radial deviation          ?Wrist pronation          ?Wrist supination          ?Grip strength (lbs)       85 96  ?(Blank rows = not tested) ?  ?  ?  ?  ?           ?TODAY'S TREATMENT:  ? ?[place] Adult PT Treatment:                                                DATE: 01/06/2022 ?Therapeutic Exercise: ?Sidelying Lt shoulder ER with 1# dumbbell 3x10 ?Supine shoulder flexion AAROM with wooden dowel with 2#  ankle weight 2x10 with 10-sec hold at end-range ?Seated low rows with 35# 2x10 with 3-sec hold ?Seated high rows with 35# 2x10 with 3-sec hold ?Seated shoulder rolls 2x10 ?Manual Therapy: ?Supine Lt shoulder flexion contract/ co-contract MET x5 with 30sec hold at end range ?Supine Lt shoulder abduction contract/ co-contract MET x5 with 30sec hold at end range ?Supine Lt shoulder grade 3 AP/PA/inf joint mobilizations x5 min ?Sidelying scapular protraction contract/ co-contract MET x5 with 20sec hold at end range ?Sidelying scapular rhythmic stabilization x3 minutes ?Neuromuscular re-ed: ?N/A ?Therapeutic Activity: ?N/A ?Modalities: ?N/A ?Self Care: ?N/A ? ? ?[place] Adult PT Treatment:                                                DATE: 12/30/2021 ?Therapeutic Exercise: ?Seated BIL shoulder ER with RTB 2x10 with 3-sec hold ?Lt shoulder UE Ranger overhead flexion AAROM with GTB resistance into adduction and abduction 2x5 with 10-sec hold  BIL ?Seated low rows with 35# 2x10 ?Seated high rows with 35# 2x10 ?Manual Therapy: ?Lt shoulder PROM with gentle distraction/ vibration in all planes ?Supine Lt shoulder IR contract/ relax MET x3 with holds at end range ?Neuromuscular re-ed: ?N/A ?Therapeutic Activity: ?N/A ?Modalities: ?N/A ?Self Care: ?N/A ? ? ?[place] Adult PT Treatment:                                                DATE: 12/22/2021 ?Therapeutic Exercise: ?Black physioball roll-up with gentle PT perturbations at end-range 6x15 seconds ?Standing shoulder abduction isometric at doorway 2x10 with 5-sec holds ?Standing shoulder flexion isometric at doorway 2x10 with 5-sec hold ?Manual Therapy: ?Lt shoulder PROM with gentle distraction/ vibration in all planes ?Neuromuscular re-ed: ?N/A ?Therapeutic Activity: ?Re-assessment of objective measures with pt education on progress made in PT ?Modalities: ?N/A ?Self Care: ?N/A ? ? ? ?  ?   ?PATIENT EDUCATION: ?Education details: n/a ?Person educated: n/a  ?Education method:n/a ?Education comprehension: n/a ?  ?  ?HOME EXERCISE PROGRAM: ?Access Code: [email or web address] ?URL: [email or web address] ?Date: 10/28/2021 ?Prepared by: Gwendolyn Grant ?  ?Exercises ?Flexion-Extension Shoulder Pendulum with Table Support - 3 x daily - 7 x weekly - 1 sets - 60 sec hold ?Horizontal Shoulder Pendulum with Table Support - 3 x daily - 7 x weekly - 1 sets - 60 sec hold ?Putty Squeezes - 2 x daily - 7 x weekly - 2 sets - 10 reps ?Wrist Flexion Extension AROM with Fingers Curled and Palm Down - 2 x daily - 7 x weekly - 2 sets - 10 reps ? ?Added 12/23/2021: ?- Standing Isometric Shoulder Abduction with Doorway - Arm Bent *50% FORCE*  - 1 x daily - 7 x weekly - 2 sets - 10 reps - 5-sec hold ?- Standing Isometric Shoulder Flexion with Doorway - Arm Bent  - 1 x daily - 7 x weekly - 2 sets - 10 reps - 5-sec hold ?  ?  ?ASSESSMENT: ?  ?CLINICAL IMPRESSION: ?Pt responded well to all interventions today, demonstrating good form and no  increase in pain with performed exercises. He responded well to early, light concentric exercises, reporting no pain and only muscle activation with completed exercises. The pt's global shoulder ROM was visually impro

## 2022-01-08 ENCOUNTER — Ambulatory Visit: Payer: Medicare PPO

## 2022-01-10 NOTE — Therapy (Signed)
?OUTPATIENT PHYSICAL THERAPY TREATMENT NOTE ? ? ? ? ?Patient Name: Stephen Chase ?MRN: [id number] ?DOB:Sep 29, 1965, 56 y.o., male ?Today's Date: 01/13/2022 ? ?PCP: [name], FNP ?REFERRING PROVIDER: [name], FNP ? ? PT End of Session - 01/13/22 1001   ? ? Visit Number 16   ? Number of Visits 25   ? Date for PT Re-Evaluation 01/24/22   ? Authorization Type [employer] MCR-   ? Authorization Time Period 12/23/2021-02/06/2022   ? Authorization - Visit Number 4   ? Authorization - Number of Visits 12   ? Progress Note Due on Visit 20   ? PT Start Time 1002   ? PT Stop Time 1043   ? PT Time Calculation (min) 41 min   ? Activity Tolerance Patient tolerated treatment well   ? Behavior During Therapy [facility] for tasks assessed/performed   ? ?  ?  ? ?  ? ? ? ? ? ? ? ? ? ? ? ? ? ? ? ? ?Past Medical History:  ?Diagnosis Date  ? Allergy   ? Arthritis   ? SHOULDERS  ? Balanitis   ? Deafness, sensorineural, childhood onset   ? BILATERAL SINCE AGE 18  SECONDARY TO UNKNOWN ILLNESS  ? GERD (gastroesophageal reflux disease)   ? Hyperlipidemia   ? Hypertension   ? No blood products   ? Patient is Jehovah Witness no blood products.  ? OSA on CPAP   ? SEVERE PER STUDY 03-09-2009  ? Sleep apnea   ? uses a cpap  ? Smokers' cough ([place])   ? Type 2 diabetes mellitus ([place])   ? ?Past Surgical History:  ?Procedure Laterality Date  ? CIRCUMCISION N/A 12/19/2013  ? Procedure: CIRCUMCISION ADULT;  Surgeon: Lowella Bandy, MD;  Location: [facility];  Service: Urology;  Laterality: N/A;  ? SHOULDER SURGERY Right   ? WRIST SURGERY Left   ? ?Patient Active Problem List  ? Diagnosis Date Noted  ? Hypersomnia, persistent 05/09/2018  ? Type 2 diabetes mellitus with diabetic neuropathy, without long-term current use of insulin ([place]) 05/09/2018  ? Uncontrolled type 2 diabetes mellitus with hyperglycemia ([place]) 05/09/2018  ? OSA on CPAP 05/09/2018  ? Esophageal varices without bleeding ([place]) 05/09/2018  ? Bilateral deafness 05/09/2018  ? ? ?REFERRING  DIAG:  s/p left shoulder RCR ,SAD  ? ?THERAPY DIAG:  ?Acute pain of left shoulder ? ?Muscle weakness (generalized) ? ?PERTINENT HISTORY: Bilateral deafness  ? ?PRECAUTIONS: shoulder ? ?Interpreter utilized ?SUBJECTIVE:  ?Pt reports that he continues to get better, reporting decreased stiffness. He reports good days and bad days. He continues to do most of his HEP.  ? ?PAIN:  ?Are you having pain? Yes ?NPRS scale: 1-2/10 ?Pain location: Lt shoulder  ?PAIN TYPE: sore  ?Pain description: intermittent  ?Aggravating factors: movement ?Relieving factors: rest  ? ? ?OBJECTIVE:  ? *Unless otherwise noted all objective measures were captured on initial evaluation.  ? ?  ?  ?PATIENT SURVEYS:  ?FOTO 12% function ?11/18/2021 53% ?12/02/21: 69% ?  ?  ?POSTURE: ?Forward head, rounded shoulders  ?  ?PALPATION: ?Diffuse tenderness about Lt shoulder  ?  ?UPPER EXTREMITY AROM/PROM: Rt shoulder AROM WNL with exception of functional IR is moderately limited.  ?  ?A/PROM Right ?10/28/2021 Left PROM ?10/28/2021 10/30/21 ?Left PROM 11/04/21 ?Left PROM 11/06/21 ?Left PROM 11/11/21 ?Left PROM 11/18/2021 ?Left PROM 11/20/21 11/27/21 ?Left PROM 12/02/21 ?Left  3/16 12/23/2021 ?Left  ?Shoulder flexion   65   120 133 128 135  PROM 145 ?AROM 140 supine   PROM: 158 165 AROM ?170 PROM  ?Shoulder extension                ?Shoulder abduction   69 85 86 100 105 100 110 116 PROM 116  ?AROM 135 supine  140 AROM ?155 PROM  ?Shoulder adduction                ?Shoulder internal rotation            PROM 65 ?AROM 55   65 AROM ?75 PROM  ?Shoulder external rotation   _0 45 PROM 55 ?AROM 35  45 AROM ?47 PROM  ?Elbow flexion                ?Elbow extension                ?Wrist flexion                ?Wrist extension                ?Wrist ulnar deviation                ?Wrist radial deviation                ?Wrist pronation                ?Wrist supination                ?(Blank rows = not tested) ?  ?UPPER EXTREMITY MMT:  ?  ?MMT Right ?10/28/2021 Left ?10/28/2021  12/02/21 ?Left  12/23/2021 12/30/2021 ?Right 12/30/2021 ?Left  ?Shoulder flexion     3-/5 4+/5 minor p!    ?Shoulder extension          ?Shoulder abduction     3-/5 5/5 minor p!    ?Shoulder adduction          ?Shoulder internal rotation     3-/5 5/5 minor p!    ?Shoulder external rotation     3-/5 5/5    ?Middle trapezius          ?Lower trapezius          ?Elbow flexion     4/5 5/5    ?Elbow extension     4/5 5/5    ?Wrist flexion          ?Wrist extension          ?Wrist ulnar deviation          ?Wrist radial deviation          ?Wrist pronation          ?Wrist supination          ?Grip strength (lbs)       85 96  ?(Blank rows = not tested) ?  ?  ?  ?  ?           ?TODAY'S TREATMENT:  ? ?[facility] Adult PT Treatment:                                                DATE: 01/13/2022 ?Therapeutic Exercise: ?Seated BIL shoulder scaption with 2# dumbbells 2x10 ?Seated shoulder rolls 2x10 forward and backward ?Sidelying Lt shoulder ER with 2# dumbbell 3x10 ?Standing Lt shoulder UE ranger with lateral GTB pull each  side 3x30sec each ?Standing arm circles 2x10 forward and backward ?Seated low rows with 45# 2x10 with 3-sec hold ?Seated high rows with 45# 2x10 with 3-sec hold ?Seated lat pull-down with 25# 2x10 with 3-sec hold ?Standing alternating low trap lift-off at wall 2x10 BIL ?Manual Therapy: ?N/A ?Neuromuscular re-ed: ?N/A ?Therapeutic Activity: ?N/A ?Modalities: ?N/A ?Self Care: ?N/A ? ? ?[facility] Adult PT Treatment:                                                DATE: 01/06/2022 ?Therapeutic Exercise: ?Sidelying Lt shoulder ER with 1# dumbbell 3x10 ?Supine shoulder flexion AAROM with wooden dowel with 2# ankle weight 2x10 with 10-sec hold at end-range ?Seated low rows with 35# 2x10 with 3-sec hold ?Seated high rows with 35# 2x10 with 3-sec hold ?Seated shoulder rolls 2x10 ?Manual Therapy: ?Supine Lt shoulder flexion contract/ co-contract MET x5 with 30sec hold at end range ?Supine Lt shoulder abduction contract/ co-contract MET x5  with 30sec hold at end range ?Supine Lt shoulder grade 3 AP/PA/inf joint mobilizations x5 min ?Sidelying scapular protraction contract/ co-contract MET x5 with 20sec hold at end range ?Sidelying scapular rhythmic stabilization x3 minutes ?Neuromuscular re-ed: ?N/A ?Therapeutic Activity: ?N/A ?Modalities: ?N/A ?Self Care: ?N/A ? ? ?[facility] Adult PT Treatment:                                                DATE: 12/30/2021 ?Therapeutic Exercise: ?Seated BIL shoulder ER with RTB 2x10 with 3-sec hold ?Lt shoulder UE Ranger overhead flexion AAROM with GTB resistance into adduction and abduction 2x5 with 10-sec hold BIL ?Seated low rows with 35# 2x10 ?Seated high rows with 35# 2x10 ?Manual Therapy: ?Lt shoulder PROM with gentle distraction/ vibration in all planes ?Supine Lt shoulder IR contract/ relax MET x3 with holds at end range ?Neuromuscular re-ed: ?N/A ?Therapeutic Activity: ?N/A ?Modalities: ?N/A ?Self Care: ?N/A ? ? ? ?  ?   ?PATIENT EDUCATION: ?Education details: n/a ?Person educated: n/a  ?Education method:n/a ?Education comprehension: n/a ?  ?  ?HOME EXERCISE PROGRAM: ?Access Code: [email or web address] ?URL: [email or web address] ?Date: 10/28/2021 ?Prepared by: Gwendolyn Grant ?  ?Exercises ?Flexion-Extension Shoulder Pendulum with Table Support - 3 x daily - 7 x weekly - 1 sets - 60 sec hold ?Horizontal Shoulder Pendulum with Table Support - 3 x daily - 7 x weekly - 1 sets - 60 sec hold ?Putty Squeezes - 2 x daily - 7 x weekly - 2 sets - 10 reps ?Wrist Flexion Extension AROM with Fingers Curled and Palm Down - 2 x daily - 7 x weekly - 2 sets - 10 reps ? ?Added 12/23/2021: ?- Standing Isometric Shoulder Abduction with Doorway - Arm Bent *50% FORCE*  - 1 x daily - 7 x weekly - 2 sets - 10 reps - 5-sec hold ?- Standing Isometric Shoulder Flexion with Doorway - Arm Bent  - 1 x daily - 7 x weekly - 2 sets - 10 reps - 5-sec hold ?  ?  ?ASSESSMENT: ?  ?CLINICAL IMPRESSION: ?Pt responded well to all interventions today,  demonstrating good form and no increase in pain with new exercises. He continues to show visual improvements in overhead range and functional strength as shown in exercises today.  He will continue to benefit from s

## 2022-01-13 ENCOUNTER — Ambulatory Visit: Payer: Medicare PPO

## 2022-01-13 DIAGNOSIS — M6281 Muscle weakness (generalized): Secondary | ICD-10-CM

## 2022-01-13 DIAGNOSIS — M25512 Pain in left shoulder: Secondary | ICD-10-CM

## 2022-01-14 NOTE — Therapy (Addendum)
OUTPATIENT PHYSICAL THERAPY TREATMENT NOTE/ DISCHARGE SUMMARY     Patient Name: Stephen Chase MRN: [id number] DOB:Feb 23, 1966, 56 y.o., male Today's Date: 03/03/2022  PCP: [name], [place] REFERRING PROVIDER: Tania Ade, MD                    Past Medical History:  Diagnosis Date   Allergy    Arthritis    SHOULDERS   Balanitis    Deafness, sensorineural, childhood onset    BILATERAL SINCE AGE 44  SECONDARY TO UNKNOWN ILLNESS   GERD (gastroesophageal reflux disease)    Hyperlipidemia    Hypertension    No blood products    Patient is Jehovah Witness no blood products.   OSA on CPAP    SEVERE PER STUDY 03-09-2009   Sleep apnea    uses a cpap   Smokers' cough ([place])    Type 2 diabetes mellitus ([place])    Past Surgical History:  Procedure Laterality Date   CIRCUMCISION N/A 12/19/2013   Procedure: CIRCUMCISION ADULT;  Surgeon: Lowella Bandy, MD;  Location: [facility];  Service: Urology;  Laterality: N/A;   SHOULDER SURGERY Right    WRIST SURGERY Left    Patient Active Problem List   Diagnosis Date Noted   Hypersomnia, persistent 05/09/2018   Type 2 diabetes mellitus with diabetic neuropathy, without long-term current use of insulin ([place]) 05/09/2018   Uncontrolled type 2 diabetes mellitus with hyperglycemia ([place]) 05/09/2018   OSA on CPAP 05/09/2018   Esophageal varices without bleeding ([place]) 05/09/2018   Bilateral deafness 05/09/2018    REFERRING DIAG:  s/p left shoulder RCR ,SAD   THERAPY DIAG:  Acute pain of left shoulder  Muscle weakness (generalized)  PERTINENT HISTORY: Bilateral deafness   PRECAUTIONS: shoulder  Interpreter utilized SUBJECTIVE:  Pt reports that his orthopedic follow-up went well and that he has continued to do his HEP. He rates his pain as 1-2/10.  PAIN:  Are you having pain? Yes NPRS scale: 1-2/10 Pain location: Lt shoulder  PAIN TYPE: sore  Pain description: intermittent  Aggravating factors:  movement Relieving factors: rest    OBJECTIVE:   *Unless otherwise noted all objective measures were captured on initial evaluation.       PATIENT SURVEYS:  FOTO 12% function 11/18/2021 53% 12/02/21: 69%     POSTURE: Forward head, rounded shoulders    PALPATION: Diffuse tenderness about Lt shoulder    UPPER EXTREMITY AROM/PROM: Rt shoulder AROM WNL with exception of functional IR is moderately limited.    A/PROM Right 10/28/2021 Left PROM 10/28/2021 10/30/21 Left PROM 11/04/21 Left PROM 11/06/21 Left PROM 11/11/21 Left PROM 11/18/2021 Left PROM 11/20/21 11/27/21 Left PROM 12/02/21 Left  3/16 12/23/2021 Left 01/15/2022  Shoulder flexion   65   120 133 128 135  PROM 145 AROM 140 supine   PROM: 158 165 AROM 170 PROM 165 AROM 170 PROM  Shoulder extension                 Shoulder abduction   69 85 86 100 105 100 110 116 PROM 116  AROM 135 supine  140 AROM 155 PROM 175 AROM  Shoulder adduction                 Shoulder internal rotation            PROM 65 AROM 55   65 AROM 75 PROM 60 AROM, 70 PROM  Shoulder external rotation   8   20   24  45 PROM 55 AROM 35  45 AROM 47 PROM 45 AROM 47 PROM p!  Elbow flexion                 Elbow extension                 Wrist flexion                 Wrist extension                 Wrist ulnar deviation                 Wrist radial deviation                 Wrist pronation                 Wrist supination                 (Blank rows = not tested)   UPPER EXTREMITY MMT:    MMT Right 10/28/2021 Left 10/28/2021 12/02/21 Left  12/23/2021 12/30/2021 Right 12/30/2021 Left Left 01/15/2022  Shoulder flexion     3-/5 4+/5 minor p!   5/5  Shoulder extension           Shoulder abduction     3-/5 5/5 minor p!   5/5  Shoulder adduction           Shoulder internal rotation     3-/5 5/5 minor p!   5/5  Shoulder external rotation     3-/5 5/5   4+/5 minor p!  Middle trapezius           Lower trapezius           Elbow flexion     4/5 5/5     Elbow extension      4/5 5/5     Wrist flexion           Wrist extension           Wrist ulnar deviation           Wrist radial deviation           Wrist pronation           Wrist supination           Grip strength (lbs)       85 96   (Blank rows = not tested)                    TODAY'S TREATMENT:   [facility] Adult PT Treatment:                                                DATE: 01/15/2022 Therapeutic Exercise: Sidelying Lt shoulder ER with 3# dumbbell 1x10, 4# dumbbell 2x10 Prone alternating low trap lift-off 3x20 Seated low rows with 55# 2x10 with 3-sec hold Seated high rows with 55# 2x10 with 3-sec hold Seated lat pull-down with 45# 2x10 with 3-sec hold Seated shoulder rolls 2x10 forward and backward Standing shoulder extension with two 7# cables 3x10 Standing cross-body adduction shoulder stretch x47min on Lt Manual Therapy: N/A Neuromuscular re-ed: N/A Therapeutic Activity: Standing scaption lift with 3# dumbbell from counter to chest-height shelf, then to eye-level shelf, then to overhead shelf, and back down 2x8 Modalities: N/A Self Care: N/A   [facility] Adult PT  Treatment:                                                DATE: 01/13/2022 Therapeutic Exercise: Seated BIL shoulder scaption with 2# dumbbells 2x10 Seated shoulder rolls 2x10 forward and backward Sidelying Lt shoulder ER with 2# dumbbell 3x10 Standing Lt shoulder UE ranger with lateral GTB pull each side 3x30sec each Standing arm circles 2x10 forward and backward Seated low rows with 45# 2x10 with 3-sec hold Seated high rows with 45# 2x10 with 3-sec hold Seated lat pull-down with 25# 2x10 with 3-sec hold Standing alternating low trap lift-off at wall 2x10 BIL Manual Therapy: N/A Neuromuscular re-ed: N/A Therapeutic Activity: N/A Modalities: N/A Self Care: N/A   [facility] Adult PT Treatment:                                                DATE: 01/06/2022 Therapeutic Exercise: Sidelying Lt shoulder ER with 1# dumbbell  3x10 Supine shoulder flexion AAROM with wooden dowel with 2# ankle weight 2x10 with 10-sec hold at end-range Seated low rows with 35# 2x10 with 3-sec hold Seated high rows with 35# 2x10 with 3-sec hold Seated shoulder rolls 2x10 Manual Therapy: Supine Lt shoulder flexion contract/ co-contract MET x5 with 30sec hold at end range Supine Lt shoulder abduction contract/ co-contract MET x5 with 30sec hold at end range Supine Lt shoulder grade 3 AP/PA/inf joint mobilizations x5 min Sidelying scapular protraction contract/ co-contract MET x5 with 20sec hold at end range Sidelying scapular rhythmic stabilization x3 minutes Neuromuscular re-ed: N/A Therapeutic Activity: N/A Modalities: N/A Self Care: N/A          PATIENT EDUCATION: Education details: n/a Person educated: n/a  Education method:n/a Education comprehension: n/a     HOME EXERCISE PROGRAM: Access Code: [email or web address] URL: [email or web address] Date: 10/28/2021 Prepared by: Gwendolyn Grant   Exercises Flexion-Extension Shoulder Pendulum with Table Support - 3 x daily - 7 x weekly - 1 sets - 60 sec hold Horizontal Shoulder Pendulum with Table Support - 3 x daily - 7 x weekly - 1 sets - 60 sec hold Putty Squeezes - 2 x daily - 7 x weekly - 2 sets - 10 reps Wrist Flexion Extension AROM with Fingers Curled and Palm Down - 2 x daily - 7 x weekly - 2 sets - 10 reps  Added 12/23/2021: - Standing Isometric Shoulder Abduction with Doorway - Arm Bent *50% FORCE*  - 1 x daily - 7 x weekly - 2 sets - 10 reps - 5-sec hold - Standing Isometric Shoulder Flexion with Doorway - Arm Bent  - 1 x daily - 7 x weekly - 2 sets - 10 reps - 5-sec hold     ASSESSMENT:   CLINICAL IMPRESSION: The pt responded well to all interventions today, demonstrating good form and no increase in pain with performed exercises. Upon re-assessment of shoulder strength and ROM, the pt has made excellent progress in abduction AROM and decreased pain  with resisted movement. He is near WNL in all shoulder ROM measures and tolerated overhead lifts well today. He will continue to benefit from skilled PT to address his primary impairments and return to his prior level of function with less limitation.  REHAB POTENTIAL: Good   CLINICAL DECISION MAKING: Stable/uncomplicated   EVALUATION COMPLEXITY: Low     GOALS: Goals reviewed with patient? No   SHORT TERM GOALS:   STG Name Target Date Goal status  1 Patient will be independent and compliant with initial HEP.    Baseline:  11/18/2021 Achieved   2 Patient will demonstrate WNL PROM of the Lt shoulder in order to progress towards next phase of rehab.  Baseline:  12/23/2021: Achieved 12/09/2021 Achieved    LONG TERM GOALS:    LTG Name Target Date Goal status  1 Patient will demonstrate at least 160 degrees of Lt shoulder flexion and abduction AROM to improve ability to reach overhead Baseline: 12/23/2021: nearly met 01/23/22 Ongoing   2 Patient will demonstrate at least 50 degrees of Lt shoulder IR/ER AROM to improve ability to complete self-care activities.  Baseline: 12/23/2021: See updated AROM chart 01/23/22 Partially met    3 Patient will demonstrate at least 4/5 Lt shoulder strength to improve ability to lift items.  Baseline: 01/23/22 Ongoing   4 Patient will demonstrate 5/5 Lt elbow strength to improve ability to carry objects.  Baseline: 12/23/2021: achieved 01/23/22 Achieved   5 Patient will score at least 58% function on FOTO to signify clinically meaningful improvement in functional abilities.  11/18/2021 53% 12/02/2021: 69% 01/23/22 Achieved      PLAN: PT FREQUENCY: 2x/week   PT DURATION: 12 weeks   PLANNED INTERVENTIONS: Therapeutic exercises, Therapeutic activity, Neuro Muscular re-education, Patient/Family education, Joint mobilization, Aquatic Therapy, Dry Needling, Cryotherapy, Moist heat, Vasopneumatic device, Ultrasound, and Manual therapy   PLAN FOR NEXT SESSION:   progress per post-op protocol; progress note in order to request additional Robyne Peers, PT, DPT 03/03/22 3:56 PM  PHYSICAL THERAPY DISCHARGE SUMMARY  Visits from Start of Care: 17  Current functional level related to goals / functional outcomes: Pt has met or mostly met all of his rehab goals.   Remaining deficits: Unable to assess shoulder strength due to pt not arriving since last visit.   Education / Equipment: HEP   Patient agrees to discharge. Patient goals were partially met. Patient is being discharged due to meeting the stated rehab goals/ not returning since last visit.  Vanessa [place], PT, DPT 03/03/22 3:56 PM

## 2022-01-15 ENCOUNTER — Ambulatory Visit: Payer: Medicare PPO

## 2022-01-15 DIAGNOSIS — M25512 Pain in left shoulder: Secondary | ICD-10-CM | POA: Diagnosis not present

## 2022-01-15 DIAGNOSIS — M6281 Muscle weakness (generalized): Secondary | ICD-10-CM

## 2022-01-15 IMAGING — DX DG LUMBAR SPINE COMPLETE 4+V
4 series · 4 of 4 positions shown · non-contrast
Comparison: None.

CLINICAL DATA: low back pain, fall

EXAM:
LUMBAR SPINE - COMPLETE 4+ VIEW

[l-spine ap]
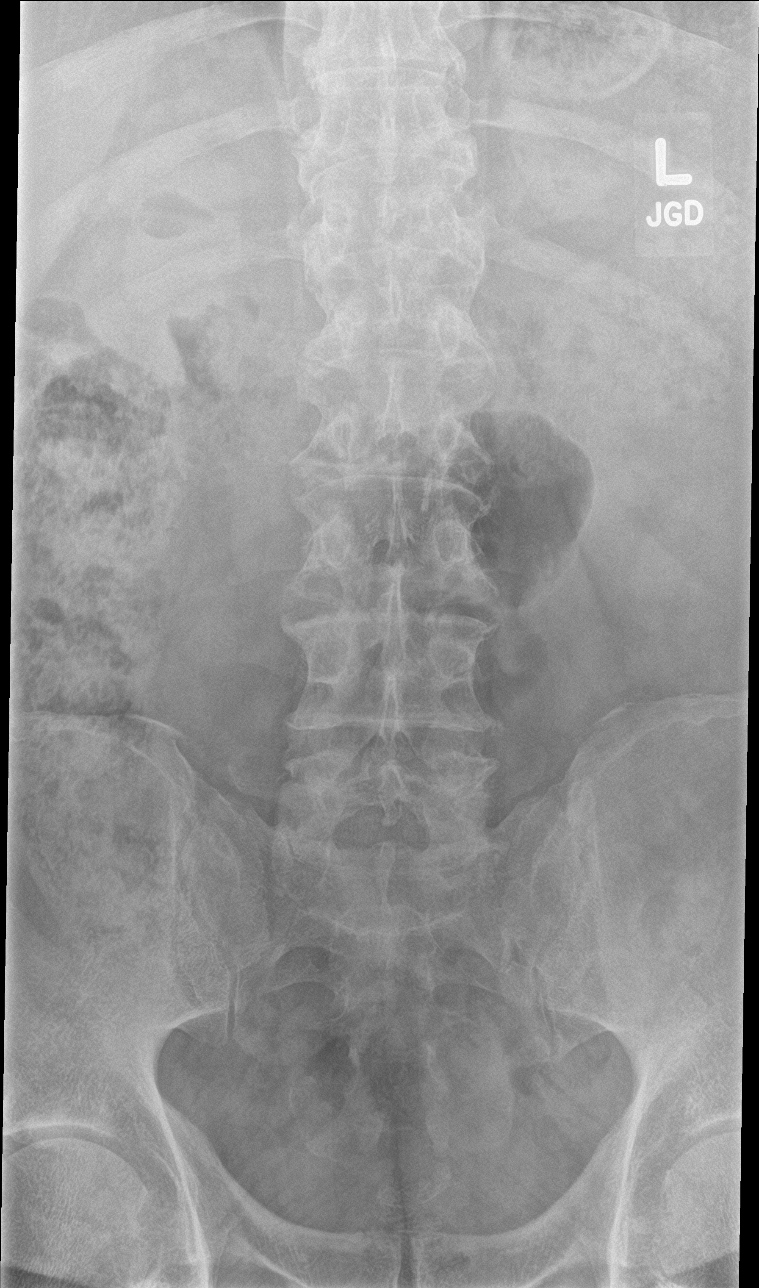

[l-spine obl (1 of 2)]
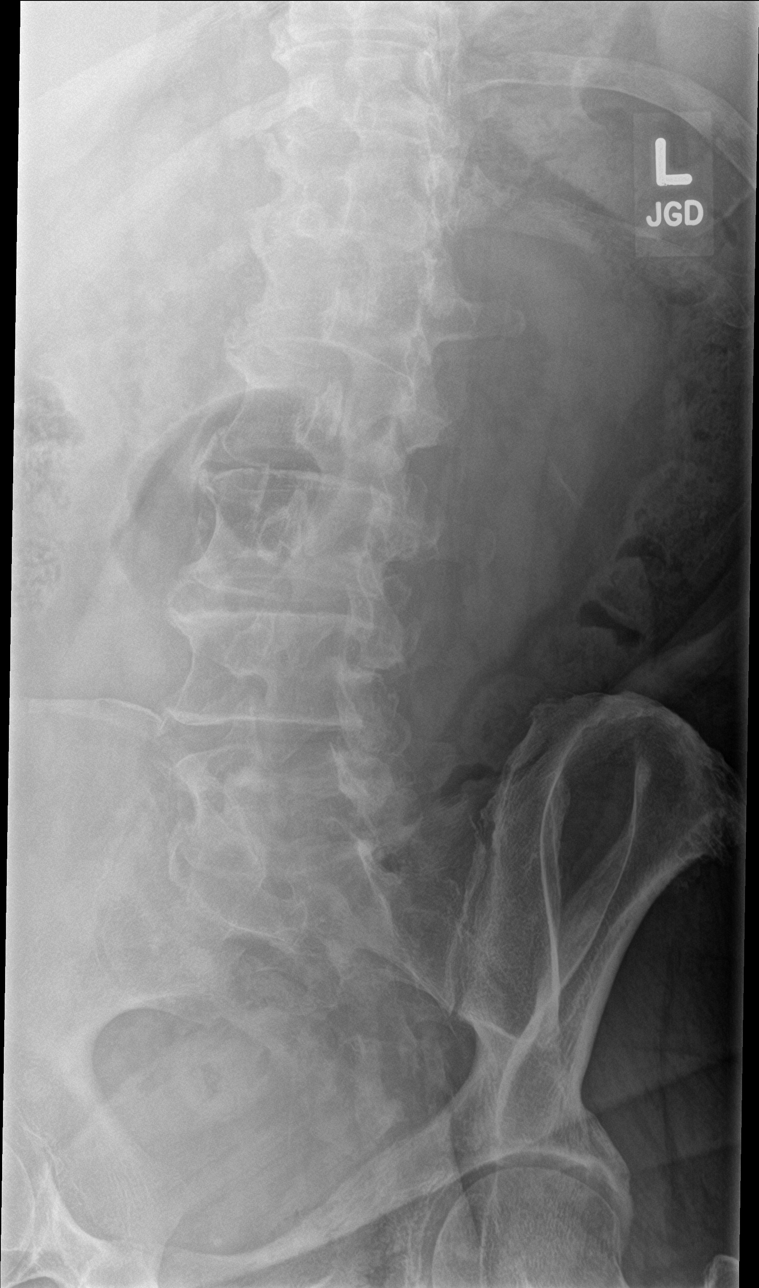

[l-spine obl (2 of 2)]
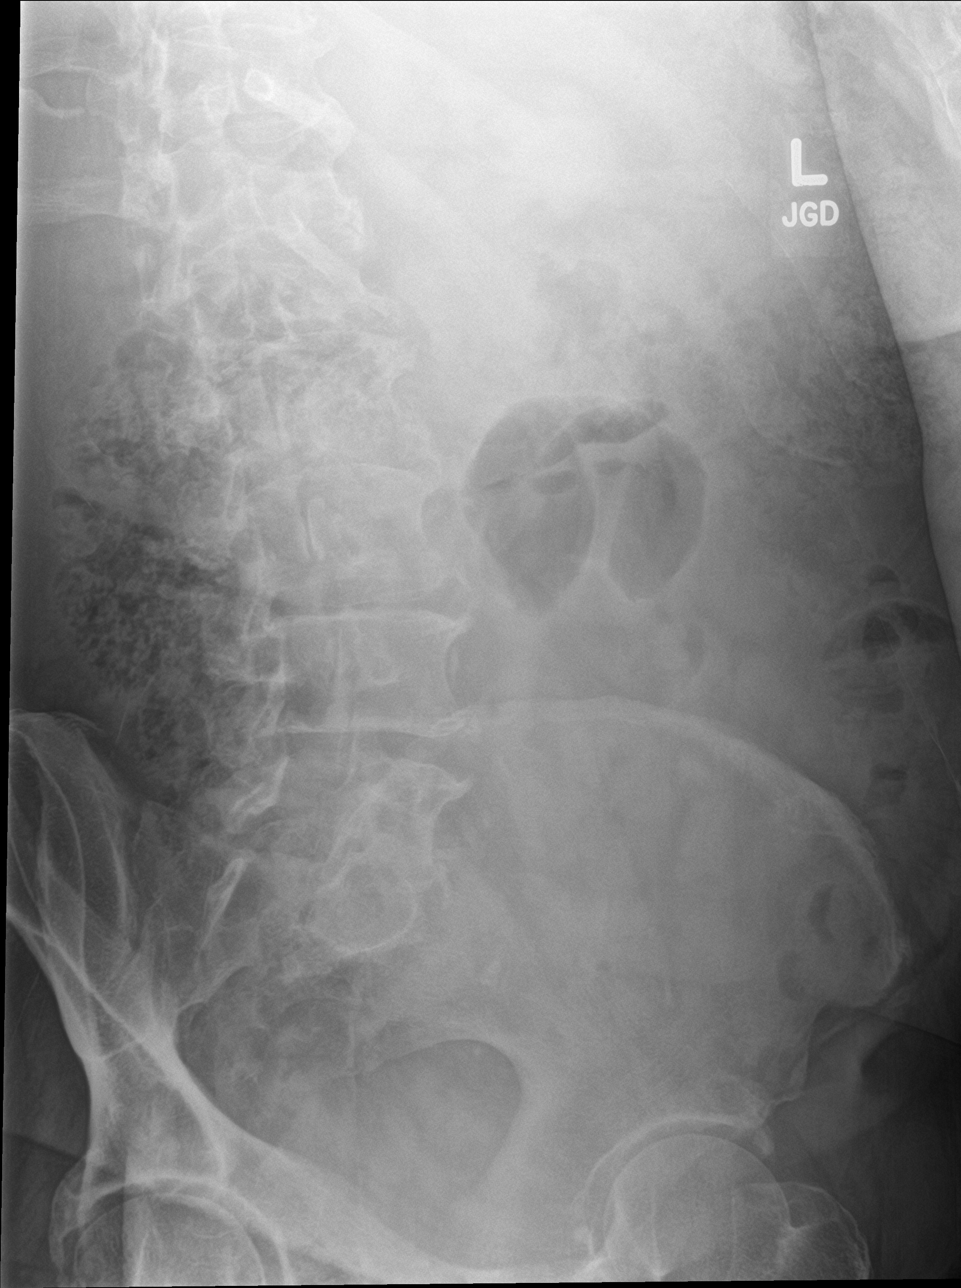

[l-spine lat]
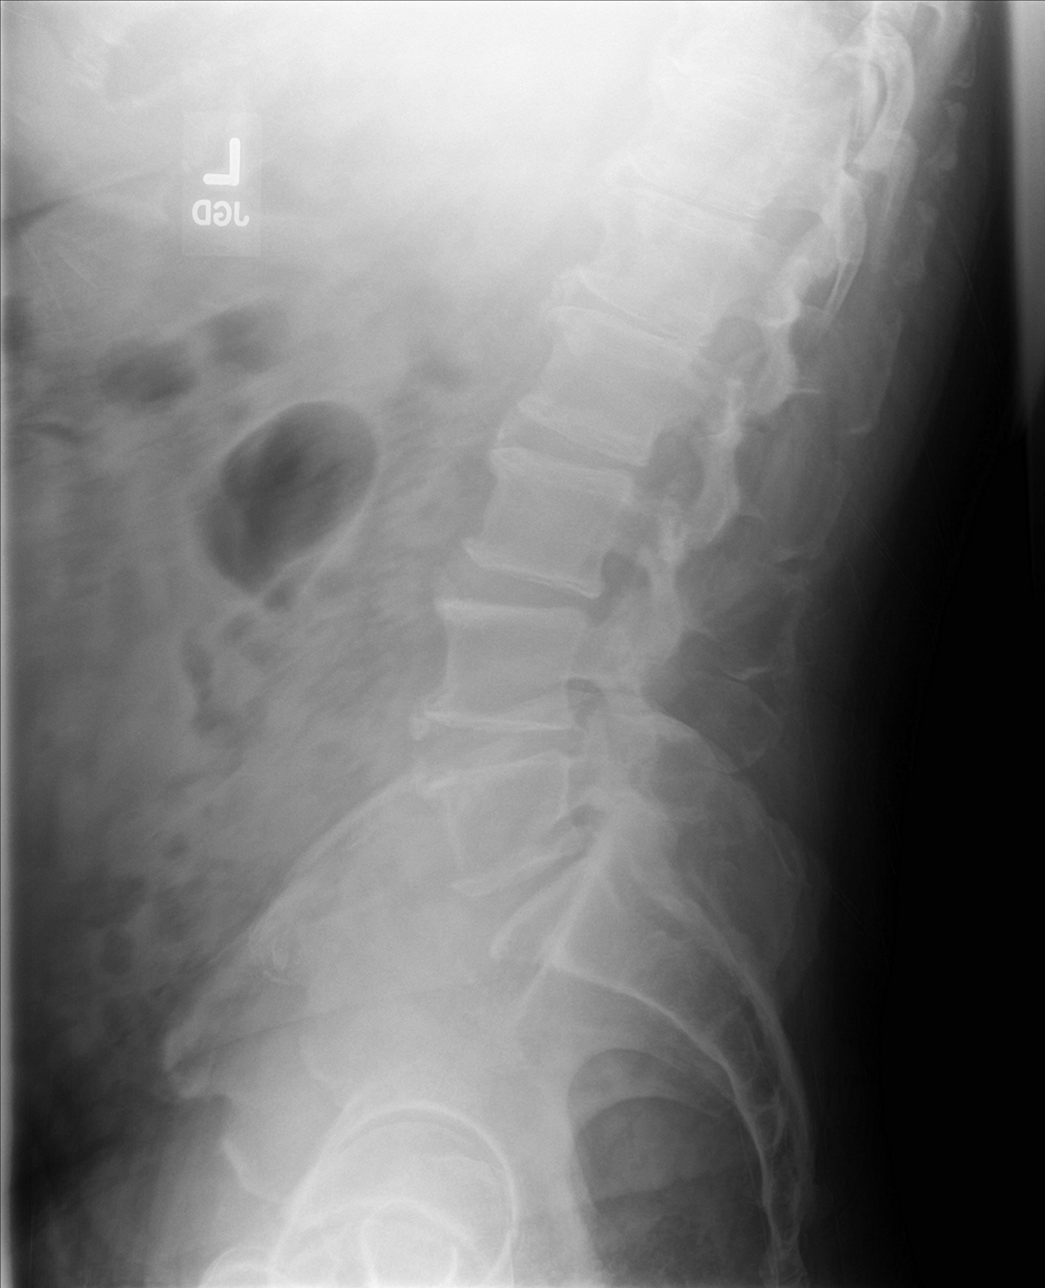

[4 of 4 positions shown; findings below may reference images not displayed]

FINDINGS: There are 5 non-rib-bearing lumbar vertebrae. There is mild
multilevel degenerative disc disease and lower lumbar predominant
facet arthropathy. No significant listhesis. No evidence of lumbar
spine fracture.
IMPRESSION: Mild multilevel degenerative disc disease and lower lumbar
predominant facet arthropathy.

## 2022-01-16 ENCOUNTER — Encounter: Payer: Self-pay | Admitting: Nurse Practitioner

## 2022-01-19 ENCOUNTER — Other Ambulatory Visit: Payer: Self-pay

## 2022-01-19 MED ORDER — CARVEDILOL 6.25 MG PO TABS: ORAL_TABLET | ORAL | 1 refills | Status: DC

## 2022-01-19 MED ORDER — OMEPRAZOLE 10 MG PO CPDR: DELAYED_RELEASE_CAPSULE | ORAL | 1 refills | Status: DC

## 2022-01-20 ENCOUNTER — Telehealth: Payer: Self-pay

## 2022-01-20 NOTE — Telephone Encounter (Signed)
CARE PLAN ENTRY ?(see longitudinal plan of care for additional care plan information) ? ?Current Barriers:  ?Financial Barriers: patient has [employer] and reports copay for is cost prohibitive at this time ? ?Pharmacist Clinical Goal(s):  ?Over the next 30 days, patient will work with PharmD and providers to relieve medication access concerns ? ?Interventions: ?Comprehensive medication review completed; medication list updated in electronic medical record.  ?Inter-disciplinary care team collaboration (see longitudinal plan of care) ?Patients medication, Ozempic is here for patient.  ? ?Patient Self Care Activities:  ?Patient to pick up [place] from [facility].  ? ?[name], CPP, PharmD ?Clinical Pharmacist Practitioner ?[facility] ?[phone] ? ? ?

## 2022-01-22 ENCOUNTER — Ambulatory Visit: Payer: Medicare PPO

## 2022-01-22 ENCOUNTER — Telehealth: Payer: Self-pay

## 2022-01-22 NOTE — Chronic Care Management (AMB) (Addendum)
    Chronic Care Management Pharmacy Assistant   Name: Stephen Chase  MRN: [id number] DOB: Jan 10, 1966   Reason for Encounter: Medication adherence, Reschedule appointment    Medications: Outpatient Encounter Medications as of 01/22/2022  Medication Sig   Accu-Chek Softclix Lancets lancets Use as instructed to test blood sugars 3 times a day dx code e11.65   aspirin EC 81 MG tablet Take 81 mg by mouth daily.   atorvastatin (LIPITOR) 40 MG tablet TAKE 1 TABLET(40 MG) BY MOUTH AT BEDTIME   Blood Glucose Monitoring Suppl (ACCU-CHEK GUIDE) w/Device KIT Use to check blood sugars three times daily E11.9   carvedilol (COREG) 6.25 MG tablet TAKE 1 TABLET(6.25 MG) BY MOUTH TWICE DAILY WITH A MEAL   cholecalciferol (VITAMIN D3) 25 MCG (1000 UNIT) tablet Take 1,000 Units by mouth daily.   Cyanocobalamin (B-12 PO) Take by mouth daily.   cyclobenzaprine (FLEXERIL) 10 MG tablet Take 1 tablet (10 mg total) by mouth 3 (three) times daily as needed for muscle spasms.   fenofibrate (TRICOR) 145 MG tablet TAKE 1 TABLET(145 MG) BY MOUTH DAILY   glucose blood (ACCU-CHEK GUIDE) test strip CHECK BLOOD SUGAR 3 TIMES A DAY BEFORE BREAKFAST, LUNCH, AND DINNER   glucose blood test strip Use as instructed to test blood sugars 3 times a day dx code e11.65   Icosapent Ethyl (VASCEPA) 1 g CAPS One bid for elevated triglycerides   Insulin Pen Needle (PEN NEEDLES) 32G X 4 MM MISC Inject 1 each into the skin daily.   Magnesium 250 MG TABS Take 1 tablet (250 mg total) by mouth every evening. With evening meals   modafinil (PROVIGIL) 200 MG tablet TAKE 1 TABLET(200 MG) BY MOUTH DAILY   Multiple Vitamins-Minerals (MULTIVITAMIN WITH MINERALS) tablet Take 1 tablet by mouth daily.   naproxen (NAPROSYN) 375 MG tablet Take 1 tablet (375 mg total) by mouth 2 (two) times daily with a meal.   niacin (NIASPAN) 500 MG CR tablet Take 2,000 mg by mouth at bedtime.    olmesartan-hydrochlorothiazide (BENICAR HCT) 40-25 MG tablet Take 1  tablet by mouth daily.   Omega-3 Fatty Acids (FISH OIL) 1000 MG CPDR Take by mouth. Take one tablet daily   omeprazole (PRILOSEC) 10 MG capsule TAKE 1 CAPSULE(10 MG) BY MOUTH DAILY   OVER THE COUNTER MEDICATION Vitamin D 3 One capsule daily.   Semaglutide, 2 MG/DOSE, (OZEMPIC, 2 MG/DOSE,) 8 MG/3ML SOPN Inject 2 mg into the skin once a week.   tamsulosin (FLOMAX) 0.4 MG CAPS capsule TAKE 1 CAPSULE BY MOUTH EVERY DAY 30 MINUTES AFTER THE SAME MEAL   tiZANidine (ZANAFLEX) 4 MG tablet Take 1 tablet (4 mg total) by mouth at bedtime.   UNABLE TO FIND CPAP for OSA (6cm-19cm)   No facility-administered encounter medications on file as of 01/22/2022.   01-22-2022: Patient is on medication adherence list for non-compliance for atorvastatin. Patient's medication was filled on 12-28-2021 for 90 DS. Updated medication adherence form.  01-26-2022: Left patient voicemail to schedule follow up telephone appointment with [name] CPP.  [place] Pharmacist Assistant [phone]

## 2022-01-27 NOTE — Therapy (Incomplete)
?OUTPATIENT PHYSICAL THERAPY TREATMENT NOTE ? ? ? ? ?Patient Name: Stephen Chase ?MRN: [id number] ?DOB:[birth year], 56 y.o., male ?Today's Date: 01/27/2022 ? ?PCP: [name], FNP ?REFERRING PROVIDER: [name], FNP ? ? ? ? ? ? ? ? ? ? ? ? ? ? ? ? ? ? ? ?Past Medical History:  ?Diagnosis Date  ? Allergy   ? Arthritis   ? SHOULDERS  ? Balanitis   ? Deafness, sensorineural, childhood onset   ? BILATERAL SINCE AGE 67  SECONDARY TO UNKNOWN ILLNESS  ? GERD (gastroesophageal reflux disease)   ? Hyperlipidemia   ? Hypertension   ? No blood products   ? Patient is Jehovah Witness no blood products.  ? OSA on CPAP   ? SEVERE PER STUDY 03-09-2009  ? Sleep apnea   ? uses a cpap  ? Smokers' cough ([place])   ? Type 2 diabetes mellitus ([place])   ? ?Past Surgical History:  ?Procedure Laterality Date  ? CIRCUMCISION N/A 12/19/2013  ? Procedure: CIRCUMCISION ADULT;  Surgeon: Lowella Bandy, MD;  Location: [facility];  Service: Urology;  Laterality: N/A;  ? SHOULDER SURGERY Right   ? WRIST SURGERY Left   ? ?Patient Active Problem List  ? Diagnosis Date Noted  ? Hypersomnia, persistent 05/09/2018  ? Type 2 diabetes mellitus with diabetic neuropathy, without long-term current use of insulin ([place]) 05/09/2018  ? Uncontrolled type 2 diabetes mellitus with hyperglycemia ([place]) 05/09/2018  ? OSA on CPAP 05/09/2018  ? Esophageal varices without bleeding ([place]) 05/09/2018  ? Bilateral deafness 05/09/2018  ? ? ?REFERRING DIAG:  s/p left shoulder RCR ,SAD  ? ?THERAPY DIAG:  ?No diagnosis found. ? ?PERTINENT HISTORY: Bilateral deafness  ? ?PRECAUTIONS: shoulder ? ?Interpreter utilized ?SUBJECTIVE:  ?*** ? ?PAIN:  ?Are you having pain? Yes ?NPRS scale: 1-2/10 ?Pain location: Lt shoulder  ?PAIN TYPE: sore  ?Pain description: intermittent  ?Aggravating factors: movement ?Relieving factors: rest  ? ? ?OBJECTIVE:  ? *Unless otherwise noted all objective measures were captured on initial evaluation.  ? ?  ?  ?PATIENT SURVEYS:  ?FOTO 12%  function ?11/18/2021 53% ?12/02/21: 69% ?  ?  ?POSTURE: ?Forward head, rounded shoulders  ?  ?PALPATION: ?Diffuse tenderness about Lt shoulder  ?  ?UPPER EXTREMITY AROM/PROM: Rt shoulder AROM WNL with exception of functional IR is moderately limited.  ?  ?A/PROM Right ?10/28/2021 Left PROM ?10/28/2021 10/30/21 ?Left PROM 11/04/21 ?Left PROM 11/06/21 ?Left PROM 11/11/21 ?Left PROM 11/18/2021 ?Left PROM 11/20/21 11/27/21 ?Left PROM 12/02/21 ?Left  3/16 12/23/2021 ?Left 01/15/2022  ?Shoulder flexion   65   120 133 128 135  PROM 145 ?AROM 140 supine   PROM: 158 165 AROM ?170 PROM 165 AROM ?170 PROM  ?Shoulder extension                 ?Shoulder abduction   69 85 86 100 105 100 110 116 PROM 116  ?AROM 135 supine  140 AROM ?155 PROM 175 AROM  ?Shoulder adduction                 ?Shoulder internal rotation            PROM 65 ?AROM 55   65 AROM ?75 PROM 60 AROM, 70 PROM  ?Shoulder external rotation   _0 45 PROM 55 ?AROM 35  45 AROM ?47 PROM 45 AROM ?47 PROM p!  ?Elbow flexion                 ?  Elbow extension                 ?Wrist flexion                 ?Wrist extension                 ?Wrist ulnar deviation                 ?Wrist radial deviation                 ?Wrist pronation                 ?Wrist supination                 ?(Blank rows = not tested) ?  ?UPPER EXTREMITY MMT:  ?  ?MMT Right ?10/28/2021 Left ?10/28/2021 12/02/21 ?Left  12/23/2021 12/30/2021 ?Right 12/30/2021 ?Left Left ?01/15/2022  ?Shoulder flexion     3-/5 4+/5 minor p!   5/5  ?Shoulder extension           ?Shoulder abduction     3-/5 5/5 minor p!   5/5  ?Shoulder adduction           ?Shoulder internal rotation     3-/5 5/5 minor p!   5/5  ?Shoulder external rotation     3-/5 5/5   4+/5 minor p!  ?Middle trapezius           ?Lower trapezius           ?Elbow flexion     4/5 5/5     ?Elbow extension     4/5 5/5     ?Wrist flexion           ?Wrist extension           ?Wrist ulnar deviation           ?Wrist radial deviation           ?Wrist pronation           ?Wrist  supination           ?Grip strength (lbs)       85 96   ?(Blank rows = not tested) ?  ?  ?  ?  ?           ?TODAY'S TREATMENT:  ? ?[facility] Adult PT Treatment:                                                DATE: 01/28/2022 ?Therapeutic Exercise: ?*** ?Manual Therapy: ?*** ?Neuromuscular re-ed: ?*** ?Therapeutic Activity: ?*** ?Modalities: ?*** ?Self Care: ?*** ? ? ?[facility] Adult PT Treatment:                                                DATE: 01/15/2022 ?Therapeutic Exercise: ?Sidelying Lt shoulder ER with 3# dumbbell 1x10, 4# dumbbell 2x10 ?Prone alternating low trap lift-off 3x20 ?Seated low rows with 55# 2x10 with 3-sec hold ?Seated high rows with 55# 2x10 with 3-sec hold ?Seated lat pull-down with 45# 2x10 with 3-sec hold ?Seated shoulder rolls 2x10 forward and backward ?Standing shoulder extension with two 7# cables 3x10 ?Standing cross-body adduction shoulder stretch x1min on Lt ?Manual Therapy: ?N/A ?  Neuromuscular re-ed: ?N/A ?Therapeutic Activity: ?Standing scaption lift with 3# dumbbell from counter to chest-height shelf, then to eye-level shelf, then to overhead shelf, and back down 2x8 ?Modalities: ?N/A ?Self Care: ?N/A ? ? ?[facility] Adult PT Treatment:                                                DATE: 01/13/2022 ?Therapeutic Exercise: ?Seated BIL shoulder scaption with 2# dumbbells 2x10 ?Seated shoulder rolls 2x10 forward and backward ?Sidelying Lt shoulder ER with 2# dumbbell 3x10 ?Standing Lt shoulder UE ranger with lateral GTB pull each side 3x30sec each ?Standing arm circles 2x10 forward and backward ?Seated low rows with 45# 2x10 with 3-sec hold ?Seated high rows with 45# 2x10 with 3-sec hold ?Seated lat pull-down with 25# 2x10 with 3-sec hold ?Standing alternating low trap lift-off at wall 2x10 BIL ?Manual Therapy: ?N/A ?Neuromuscular re-ed: ?N/A ?Therapeutic Activity: ?N/A ?Modalities: ?N/A ?Self Care: ?N/A ? ? ? ? ?  ?   ?PATIENT EDUCATION: ?Education details: n/a ?Person educated: n/a  ?Education  method:n/a ?Education comprehension: n/a ?  ?  ?HOME EXERCISE PROGRAM: ?Access Code: [email or web address] ?URL: [email or web address] ?Date: 10/28/2021 ?Prepared by: Samantha Pexa ?  ?Exercises ?Flexion-Extension Shoulder Pendulum with Table Support - 3 x daily - 7 x weekly - 1 sets - 60 sec hold ?Horizontal Shoulder Pendulum with Table Support - 3 x daily - 7 x weekly - 1 sets - 60 sec hold ?Putty Squeezes - 2 x daily - 7 x weekly - 2 sets - 10 reps ?Wrist Flexion Extension AROM with Fingers Curled and Palm Down - 2 x daily - 7 x weekly - 2 sets - 10 reps ? ?Added 12/23/2021: ?- Standing Isometric Shoulder Abduction with Doorway - Arm Bent *50% FORCE*  - 1 x daily - 7 x weekly - 2 sets - 10 reps - 5-sec hold ?- Standing Isometric Shoulder Flexion with Doorway - Arm Bent  - 1 x daily - 7 x weekly - 2 sets - 10 reps - 5-sec hold ?  ?  ?ASSESSMENT: ?  ?CLINICAL IMPRESSION: ?*** ?  ? ?  ?REHAB POTENTIAL: Good ?  ?CLINICAL DECISION MAKING: Stable/uncomplicated ?  ?EVALUATION COMPLEXITY: Low ?  ?  ?GOALS: ?Goals reviewed with patient? No ?  ?SHORT TERM GOALS: ?  ?STG Name Target Date Goal status  ?1 Patient will be independent and compliant with initial HEP.  ?  ?Baseline:  11/18/2021 Achieved   ?2 Patient will demonstrate WNL PROM of the Lt shoulder in order to progress towards next phase of rehab.  ?Baseline:  ?12/23/2021: Achieved 12/09/2021 Achieved  ?  ?LONG TERM GOALS:  ?  ?LTG Name Target Date Goal status  ?1 Patient will demonstrate at least 160 degrees of Lt shoulder flexion and abduction AROM to improve ability to reach overhead ?Baseline: ?12/23/2021: nearly met 01/23/22 Ongoing   ?2 Patient will demonstrate at least 50 degrees of Lt shoulder IR/ER AROM to improve ability to complete self-care activities.  ?Baseline: ?12/23/2021: See updated AROM chart 01/23/22 Partially met    ?3 Patient will demonstrate at least 4/5 Lt shoulder strength to improve ability to lift items.  ?Baseline: 01/23/22 Ongoing   ?4 Patient will  demonstrate 5/5 Lt elbow strength to improve ability to carry objects.  ?Baseline: ?12/23/2021: achieved 01/23/22 Achieved   ?5 Patient will score at least 58% function   on FOTO to signify clinically meaningful improvement in functiona

## 2022-01-28 ENCOUNTER — Telehealth: Payer: Self-pay

## 2022-01-28 NOTE — Chronic Care Management (AMB) (Signed)
Fax received from [employer] patient assistance program, Ozempic 2 mg/ml (#1-3 ml pen) was filled for 120 day supply and should arrive to the office in 10-14 business days, patient has 0 refills remaining, enrollment expires 08/20/2022.  Next refill for patient will be fulfilled on 01/12/2022. Faxing reorder/refill form due to patient being out of refills so he can continue with the patient assistance program.   [name], [place] Pharmacist Assistant [phone]

## 2022-02-09 ENCOUNTER — Ambulatory Visit: Payer: Medicare PPO | Admitting: Adult Health

## 2022-02-25 DIAGNOSIS — M25512 Pain in left shoulder: Secondary | ICD-10-CM | POA: Diagnosis not present

## 2022-03-18 ENCOUNTER — Telehealth: Payer: Self-pay

## 2022-03-18 NOTE — Chronic Care Management (AMB) (Signed)
Chronic Care Management Pharmacy Assistant   Name: Stephen Chase  MRN: [id number] DOB: [birth year]  Reason for Encounter: Disease State/ Diabetes  Recent office visits:  None  Recent consult visits:  01-15-2022 Stephen Chase, PT (Physical therapy). Therapy for Acute pain of left shoulder.  01-13-2022 Stephen Chase, PT (Physical therapy). Therapy for Acute pain of left shoulder.  01-06-2022 Stephen Chase, PT (Physical therapy). Therapy for Acute pain of left shoulder.  12-30-2021 Stephen Chase, PT (Physical therapy). Therapy for Acute pain of left shoulder.  12-23-2021 Stephen Chase, PT (Physical therapy). Therapy for Acute pain of left shoulder.  12-09-2021 Pexa, Stephen Chase,  PT (Physical therapy). Therapy for Acute pain of left shoulder.  12-04-2021 Pexa, Stephen Chase, PT (Physical therapy). Therapy for Acute pain of left shoulder.  12-02-2021 Pexa, Stephen Chase, PT (Physical therapy). Therapy for Acute pain of left shoulder.  11-27-2021 Pexa, Stephen Chase, PT (Physical therapy). Therapy for Acute pain of left shoulder.  11-25-2021 Pexa, Stephen Chase, PT (Physical therapy). Therapy for Acute pain of left shoulder.  11-20-2021 Pexa, Stephen Chase, PT (Physical therapy). Therapy for Acute pain of left shoulder.  11-18-2021 Stephen Chase, PTA (Physical therapy). Therapy for Acute pain of left shoulder.  Hospital visits:  None in previous 6 months  Medications: Outpatient Encounter Medications as of 03/18/2022  Medication Sig   Accu-Chek Softclix Lancets lancets Use as instructed to test blood sugars 3 times a day dx code e11.65   aspirin EC 81 MG tablet Take 81 mg by mouth daily.   atorvastatin (LIPITOR) 40 MG tablet TAKE 1 TABLET(40 MG) BY MOUTH AT BEDTIME   Blood Glucose Monitoring Suppl (ACCU-CHEK GUIDE) w/Device KIT Use to check blood sugars three times daily E11.9   carvedilol (COREG) 6.25 MG tablet TAKE 1 TABLET(6.25 MG) BY  MOUTH TWICE DAILY WITH A MEAL   cholecalciferol (VITAMIN D3) 25 MCG (1000 UNIT) tablet Take 1,000 Units by mouth daily.   Cyanocobalamin (B-12 PO) Take by mouth daily.   cyclobenzaprine (FLEXERIL) 10 MG tablet Take 1 tablet (10 mg total) by mouth 3 (three) times daily as needed for muscle spasms.   fenofibrate (TRICOR) 145 MG tablet TAKE 1 TABLET(145 MG) BY MOUTH DAILY   glucose blood (ACCU-CHEK GUIDE) test strip CHECK BLOOD SUGAR 3 TIMES A DAY BEFORE BREAKFAST, LUNCH, AND DINNER   glucose blood test strip Use as instructed to test blood sugars 3 times a day dx code e11.65   Icosapent Ethyl (VASCEPA) 1 g CAPS One bid for elevated triglycerides   Insulin Pen Needle (PEN NEEDLES) 32G X 4 MM MISC Inject 1 each into the skin daily.   Magnesium 250 MG TABS Take 1 tablet (250 mg total) by mouth every evening. With evening meals   modafinil (PROVIGIL) 200 MG tablet TAKE 1 TABLET(200 MG) BY MOUTH DAILY   Multiple Vitamins-Minerals (MULTIVITAMIN WITH MINERALS) tablet Take 1 tablet by mouth daily.   naproxen (NAPROSYN) 375 MG tablet Take 1 tablet (375 mg total) by mouth 2 (two) times daily with a meal.   niacin (NIASPAN) 500 MG CR tablet Take 2,000 mg by mouth at bedtime.    olmesartan-hydrochlorothiazide (BENICAR HCT) 40-25 MG tablet Take 1 tablet by mouth daily.   Omega-3 Fatty Acids (FISH OIL) 1000 MG CPDR Take by mouth. Take one tablet daily   omeprazole (PRILOSEC) 10 MG capsule TAKE 1 CAPSULE(10 MG) BY MOUTH DAILY   OVER THE COUNTER MEDICATION Vitamin D 3 One capsule daily.   Semaglutide,  2 MG/DOSE, (OZEMPIC, 2 MG/DOSE,) 8 MG/3ML SOPN Inject 2 mg into the skin once a week.   tamsulosin (FLOMAX) 0.4 MG CAPS capsule TAKE 1 CAPSULE BY MOUTH EVERY DAY 30 MINUTES AFTER THE SAME MEAL   tiZANidine (ZANAFLEX) 4 MG tablet Take 1 tablet (4 mg total) by mouth at bedtime.   UNABLE TO FIND CPAP for OSA (6cm-19cm)   No facility-administered encounter medications on file as of 03/18/2022.   Recent Relevant  Labs: Lab Results  Component Value Date/Time   HGBA1C 7.8 (H) 08/11/2021 09:35 AM   HGBA1C 8.4 (H) 04/17/2021 10:25 AM   MICROALBUR 10 04/17/2021 11:13 AM   MICROALBUR 80 04/10/2020 11:49 AM    Kidney Function Lab Results  Component Value Date/Time   CREATININE 1.12 08/11/2021 09:35 AM   CREATININE 1.64 (H) 04/17/2021 10:25 AM   GFRNONAA 72 10/17/2020 02:37 PM   GFRAA 84 10/17/2020 02:37 PM    Current antihyperglycemic regimen:  Ozempic 2 mg weekly  What recent interventions/DTPs have been made to improve glycemic control:  Educated on A1c and blood sugar goals; Complications of diabetes including kidney damage, retinal damage, and cardiovascular disease; Exercise goal of 150 minutes per week; Benefits of weight loss; Benefits of routine self-monitoring of blood sugar  Have there been any recent hospitalizations or ED visits since last visit with CPP? No  Patient's wife  denies hypoglycemic symptoms  Patient's wife  denies hyperglycemic symptoms  How often are you checking your blood sugar? twice daily  What are your blood sugars ranging? Patient's wife stated patient hasn't been documenting readings but will start in July. Patient's wife stated readings have "been the same" but was unable yo tell me a range. Fasting: None Before meals: None After meals: None Bedtime: None  During the week, how often does your blood glucose drop below 70? Never  Are you checking your feet daily/regularly? Daily  Adherence Review: Is the patient currently on a STATIN medication? Yes Is the patient currently on ACE/ARB medication? Yes Does the patient have >5 day gap between last estimated fill dates? No  NOTES: Asked patient's wife if I could contact Stephen Chase by a different phone number and she stated no. Asked patient's wife to have patient call me to schedule follow up visit with [name].  Care Gaps: Tdap overdue Shingrix overdue Colonoscopy overdue 6 month A1C  overdue AWV 04-23-2022  Star Rating Drugs: Ozempic 2 mg- Patient assistance Olmesartan/HCTZ 40-25 mg- Last filled 01-07-2022 90 DS [facility] Atorvastatin 40 mg- Last filled 12-28-2021 [facility]  [place] Clinical Pharmacist Assistant [phone]

## 2022-03-23 ENCOUNTER — Telehealth: Payer: Self-pay

## 2022-03-23 NOTE — Chronic Care Management (AMB) (Signed)
[employer] patient assistance program notification:  120- day supply of Ozempic 2 mg/ml will be filled on 04/01/2022 and should arrive to the office in 10-14 business days.   [name], [place] Pharmacist Assistant [phone]

## 2022-03-28 ENCOUNTER — Encounter: Payer: Self-pay | Admitting: Gastroenterology

## 2022-04-02 ENCOUNTER — Other Ambulatory Visit: Payer: Self-pay

## 2022-04-02 MED ORDER — ATORVASTATIN CALCIUM 40 MG PO TABS: ORAL_TABLET | ORAL | 1 refills | Status: DC

## 2022-04-03 ENCOUNTER — Other Ambulatory Visit: Payer: Self-pay

## 2022-04-03 MED ORDER — OLMESARTAN MEDOXOMIL-HCTZ 40-25 MG PO TABS: 1.0000 | ORAL_TABLET | Freq: Every day | ORAL | 0 refills | Status: DC

## 2022-04-06 ENCOUNTER — Other Ambulatory Visit: Payer: Self-pay

## 2022-04-06 MED ORDER — OLMESARTAN MEDOXOMIL-HCTZ 40-25 MG PO TABS: 1.0000 | ORAL_TABLET | Freq: Every day | ORAL | 1 refills | Status: DC

## 2022-04-06 MED ORDER — TAMSULOSIN HCL 0.4 MG PO CAPS: ORAL_CAPSULE | ORAL | 1 refills | Status: DC

## 2022-04-15 ENCOUNTER — Other Ambulatory Visit: Payer: Self-pay

## 2022-04-15 ENCOUNTER — Ambulatory Visit (INDEPENDENT_AMBULATORY_CARE_PROVIDER_SITE_OTHER): Payer: Medicare PPO | Admitting: Nurse Practitioner

## 2022-04-15 ENCOUNTER — Encounter: Payer: Self-pay | Admitting: Nurse Practitioner

## 2022-04-15 VITALS — BP 128/64 | HR 75 | Temp 97.7°F | Ht 69.0 in | Wt 235.0 lb

## 2022-04-15 DIAGNOSIS — Z2821 Immunization not carried out because of patient refusal: Secondary | ICD-10-CM | POA: Diagnosis not present

## 2022-04-15 DIAGNOSIS — E1140 Type 2 diabetes mellitus with diabetic neuropathy, unspecified: Secondary | ICD-10-CM | POA: Diagnosis not present

## 2022-04-15 DIAGNOSIS — I10 Essential (primary) hypertension: Secondary | ICD-10-CM | POA: Diagnosis not present

## 2022-04-15 DIAGNOSIS — E6609 Other obesity due to excess calories: Secondary | ICD-10-CM | POA: Diagnosis not present

## 2022-04-15 DIAGNOSIS — Z6834 Body mass index (BMI) 34.0-34.9, adult: Secondary | ICD-10-CM | POA: Diagnosis not present

## 2022-04-15 DIAGNOSIS — E782 Mixed hyperlipidemia: Secondary | ICD-10-CM

## 2022-04-15 MED ORDER — FENOFIBRATE 145 MG PO TABS: ORAL_TABLET | ORAL | 1 refills | Status: DC

## 2022-04-15 MED ORDER — EMPAGLIFLOZIN 10 MG PO TABS: 10.0000 mg | ORAL_TABLET | Freq: Every day | ORAL | 1 refills | Status: DC

## 2022-04-15 NOTE — Progress Notes (Signed)
I,Stephen Chase,acting as a Education administrator for [employer], FNP.,have documented all relevant documentation on the behalf of Stephen Brine, FNP,as directed by  Stephen Brine, FNP while in the presence of Stephen Chase, [place].  Subjective:     Patient ID: Stephen Chase , male    DOB: [birth year] , 56 y.o.   MRN: [id number]   Chief Complaint  Patient presents with   Diabetes   Hypertension    HPI  Patient presents today for a diabetes and blood pressure check. He is trying to eat more vegetables. He is here today with a deaf interpreter, [place]. Pt declines shingles and tdap vaccines today.    BP Readings from Last 3 Encounters: 04/15/22 : 128/64 10/09/21 : 104/61 08/11/21 : 116/64    Diabetes He presents for his follow-up diabetic visit. He has type 2 diabetes mellitus. His disease course has been stable. Pertinent negatives for hypoglycemia include no dizziness. Pertinent negatives for diabetes include no chest pain, no polydipsia, no polyphagia and no polyuria. There are no hypoglycemic complications. Symptoms are stable. There are no diabetic complications. Risk factors for coronary artery disease include obesity and sedentary lifestyle. Current diabetic treatment includes oral agent (dual therapy). His weight is stable. He is following a generally unhealthy diet. When asked about meal planning, he reported none. He has not had a previous visit with a dietitian. He participates in exercise intermittently (he is walking alot and working a strenuous job.). There is no change in his home blood glucose trend. (Reports his range of blood sugars 147-314, he is checking 2 hours before eating. ) An ACE inhibitor/angiotensin II receptor blocker is being taken. He does not see a podiatrist.Eye exam is current.  Hypertension This is a chronic problem. The current episode started more than 1 year ago. The problem has been gradually improving since onset. The problem is controlled. Pertinent negatives include no  chest pain or palpitations. There are no associated agents to hypertension. Risk factors for coronary artery disease include obesity, sedentary lifestyle, dyslipidemia, diabetes mellitus and male gender. Past treatments include ACE inhibitors. There are no compliance problems.  There is no history of chronic renal disease.     Past Medical History:  Diagnosis Date   Allergy    Arthritis    SHOULDERS   Balanitis    Deafness, sensorineural, childhood onset    BILATERAL SINCE AGE 38  SECONDARY TO UNKNOWN ILLNESS   GERD (gastroesophageal reflux disease)    Hyperlipidemia    Hypertension    No blood products    Patient is Jehovah Witness no blood products.   OSA on CPAP    SEVERE PER STUDY 03-09-2009   Sleep apnea    uses a cpap   Smokers' cough ([place])    Type 2 diabetes mellitus ([place])      Family History  Problem Relation Age of Onset   Cancer Mother        type unknown pt was 27 years old at that time   Liver disease Father    Healthy Brother    Cancer Other         alot of family members have cancer but doesnt know the type   Colon cancer Neg Hx    Esophageal cancer Neg Hx    Rectal cancer Neg Hx    Stomach cancer Neg Hx    Sleep apnea Neg Hx      Current Outpatient Medications:    Accu-Chek Softclix Lancets lancets, Use as instructed to test  blood sugars 3 times a day dx code e11.65, Disp: 200 each, Rfl: 3   aspirin EC 81 MG tablet, Take 81 mg by mouth daily., Disp: , Rfl:    Blood Glucose Monitoring Suppl (ACCU-CHEK GUIDE) w/Device KIT, Use to check blood sugars three times daily E11.9, Disp: 1 kit, Rfl: 0   carvedilol (COREG) 6.25 MG tablet, TAKE 1 TABLET(6.25 MG) BY MOUTH TWICE DAILY WITH A MEAL, Disp: 180 tablet, Rfl: 1   cholecalciferol (VITAMIN D3) 25 MCG (1000 UNIT) tablet, Take 1,000 Units by mouth daily., Disp: , Rfl:    Cyanocobalamin (B-12 PO), Take by mouth daily. (Patient not taking: Reported on 04/23/2022), Disp: , Rfl:    cyclobenzaprine (FLEXERIL) 10 MG  tablet, Take 1 tablet (10 mg total) by mouth 3 (three) times daily as needed for muscle spasms., Disp: 30 tablet, Rfl: 0   empagliflozin (JARDIANCE) 10 MG TABS tablet, Take 1 tablet (10 mg total) by mouth daily before breakfast., Disp: 90 tablet, Rfl: 1   glucose blood (ACCU-CHEK GUIDE) test strip, CHECK BLOOD SUGAR 3 TIMES A DAY BEFORE BREAKFAST, LUNCH, AND DINNER, Disp: 150 strip, Rfl: 2   glucose blood test strip, Use as instructed to test blood sugars 3 times a day dx code e11.65, Disp: 100 each, Rfl: 12   Icosapent Ethyl (VASCEPA) 1 g CAPS, One bid for elevated triglycerides (Patient not taking: Reported on 04/23/2022), Disp: 180 capsule, Rfl: 0   Insulin Pen Needle (PEN NEEDLES) 32G X 4 MM MISC, Inject 1 each into the skin daily., Disp: 30 each, Rfl: 2   Magnesium 250 MG TABS, Take 1 tablet (250 mg total) by mouth every evening. With evening meals, Disp: 30 tablet, Rfl: 2   modafinil (PROVIGIL) 200 MG tablet, TAKE 1 TABLET(200 MG) BY MOUTH DAILY, Disp: 30 tablet, Rfl: 5   Multiple Vitamins-Minerals (MULTIVITAMIN WITH MINERALS) tablet, Take 1 tablet by mouth daily. (Patient not taking: Reported on 04/23/2022), Disp: , Rfl:    naproxen (NAPROSYN) 375 MG tablet, Take 1 tablet (375 mg total) by mouth 2 (two) times daily with a meal., Disp: 30 tablet, Rfl: 0   olmesartan-hydrochlorothiazide (BENICAR HCT) 40-25 MG tablet, Take 1 tablet by mouth daily., Disp: 90 tablet, Rfl: 1   Omega-3 Fatty Acids (FISH OIL) 1000 MG CPDR, Take by mouth. Take one tablet daily, Disp: , Rfl:    omeprazole (PRILOSEC) 10 MG capsule, TAKE 1 CAPSULE(10 MG) BY MOUTH DAILY, Disp: 90 capsule, Rfl: 1   OVER THE COUNTER MEDICATION, Vitamin D 3 One capsule daily. (Patient not taking: Reported on 04/23/2022), Disp: , Rfl:    Semaglutide, 2 MG/DOSE, (OZEMPIC, 2 MG/DOSE,) 8 MG/3ML SOPN, Inject 2 mg into the skin once a week., Disp: 3 mL, Rfl: 1   tamsulosin (FLOMAX) 0.4 MG CAPS capsule, TAKE 1 CAPSULE BY MOUTH EVERY DAY 30 MINUTES AFTER  THE SAME MEAL, Disp: 90 capsule, Rfl: 1   tiZANidine (ZANAFLEX) 4 MG tablet, Take 1 tablet (4 mg total) by mouth at bedtime., Disp: 30 tablet, Rfl: 0   UNABLE TO FIND, CPAP for OSA (6cm-19cm), Disp: , Rfl:    atorvastatin (LIPITOR) 80 MG tablet, TAKE 1 TABLET(40 MG) BY MOUTH AT BEDTIME, Disp: 90 tablet, Rfl: 1   fenofibrate (TRICOR) 145 MG tablet, TAKE 1 TABLET(145 MG) BY MOUTH DAILY, Disp: 90 tablet, Rfl: 1   No Known Allergies   Review of Systems  Constitutional: Negative.   Respiratory: Negative.    Cardiovascular: Negative.  Negative for chest pain and palpitations.  Gastrointestinal: Negative.   Endocrine: Negative for polydipsia, polyphagia and polyuria.  Neurological: Negative.  Negative for dizziness.     Today's Vitals   04/15/22 1042  BP: 128/64  Pulse: 75  Temp: 97.7 F (36.5 C)  TempSrc: Oral  Weight: 235 lb (106.6 kg)  Height: 5' 9" (1.753 m)   Body mass index is 34.7 kg/m.  Wt Readings from Last 3 Encounters:  04/23/22 236 lb (107 kg)  04/15/22 235 lb (106.6 kg)  10/09/21 241 lb 6.4 oz (109.5 kg)    Objective:  Physical Exam Vitals reviewed.  Constitutional:      General: He is not in acute distress.    Appearance: Normal appearance. He is obese.  HENT:     Head: Normocephalic and atraumatic.  Eyes:     Pupils: Pupils are equal, round, and reactive to light.  Cardiovascular:     Rate and Rhythm: Normal rate and regular rhythm.     Pulses: Normal pulses.     Heart sounds: Normal heart sounds. No murmur heard. Pulmonary:     Effort: Pulmonary effort is normal. No respiratory distress.     Breath sounds: Normal breath sounds. No wheezing.  Musculoskeletal:     Right shoulder: Normal.     Left shoulder: No bony tenderness or crepitus. Tenderness: anterior bursa.Normal strength.  Skin:    General: Skin is warm and dry.     Capillary Refill: Capillary refill takes less than 2 seconds.     Comments: Left foot with callouses present   Neurological:      General: No focal deficit present.     Mental Status: He is alert and oriented to person, place, and time.     Cranial Nerves: No cranial nerve deficit.     Motor: No weakness.  Psychiatric:        Mood and Affect: Mood normal.        Behavior: Behavior normal.        Thought Content: Thought content normal.        Judgment: Judgment normal.         Assessment And Plan:     1. Type 2 diabetes mellitus with diabetic neuropathy, without long-term current use of insulin (HCC) Comments: Consider adding Jardiance 10 mg daily. Diabetic foot exam done with callouses present to bilateral feet - Hemoglobin A1c - Microalbumin / Creatinine Urine Ratio - empagliflozin (JARDIANCE) 10 MG TABS tablet; Take 1 tablet (10 mg total) by mouth daily before breakfast.  Dispense: 90 tablet; Refill: 1  2. Essential hypertension Comments: Blood pressure is controlled, continue current medications  3. Mixed hyperlipidemia Comments: Remains elevated, he is advised to continue his cholesterol medications and f/u with Cardiology. Avoid high fat foods.  - Lipid panel - CMP14+EGFR  4. Class 1 obesity due to excess calories with serious comorbidity and body mass index (BMI) of 34.0 to 34.9 in adult He is encouraged to initially strive for BMI less than 30 to decrease cardiac risk. He is advised to exercise no less than 150 minutes per week.   5. Tetanus, diphtheria, and acellular pertussis (Tdap) vaccination declined Discussed if he steps on a rusty nail or gets a laceration he will need a tetanus vaccine.  6. Herpes zoster vaccination declined    Patient was given opportunity to ask questions. Patient verbalized understanding of the plan and was able to repeat key elements of the plan. All questions were answered to their satisfaction.  Stephen Brine, FNP   I,  Stephen Brine, FNP, have reviewed all documentation for this visit. The documentation on 04/15/22 for the exam, diagnosis, procedures, and orders  are all accurate and complete.   IF YOU HAVE BEEN REFERRED TO A SPECIALIST, IT MAY TAKE 1-2 WEEKS TO SCHEDULE/PROCESS THE REFERRAL. IF YOU HAVE NOT HEARD FROM US/SPECIALIST IN TWO WEEKS, PLEASE GIVE [place] A CALL AT [phone].   THE PATIENT IS ENCOURAGED TO PRACTICE SOCIAL DISTANCING DUE TO THE COVID-19 PANDEMIC.

## 2022-04-15 NOTE — Patient Instructions (Signed)

## 2022-04-16 ENCOUNTER — Other Ambulatory Visit: Payer: Self-pay | Admitting: Nurse Practitioner

## 2022-04-16 DIAGNOSIS — E782 Mixed hyperlipidemia: Secondary | ICD-10-CM

## 2022-04-16 LAB — LIPID PANEL
Chol/HDL Ratio: 6.1 ratio — ABNORMAL HIGH (ref 0.0–5.0)
Cholesterol, Total: 164 mg/dL (ref 100–199)
HDL: 27 mg/dL — ABNORMAL LOW (ref >39)
LDL Chol Calc (NIH): 78 mg/dL (ref 0–99)
Triglycerides: 363 mg/dL — ABNORMAL HIGH (ref 0–149)
VLDL Cholesterol Cal: 59 mg/dL — ABNORMAL HIGH (ref 5–40)

## 2022-04-16 LAB — CMP14+EGFR
ALT: 30 IU/L (ref 0–44)
AST: 32 IU/L (ref 0–40)
Albumin/Globulin Ratio: 2.2 (ref 1.2–2.2)
Albumin: 5 g/dL — ABNORMAL HIGH (ref 3.8–4.9)
Alkaline Phosphatase: 78 IU/L (ref 44–121)
BUN/Creatinine Ratio: 13 (ref 9–20)
BUN: 18 mg/dL (ref 6–24)
Bilirubin Total: 0.4 mg/dL (ref 0.0–1.2)
CO2: 22 mmol/L (ref 20–29)
Calcium: 9.8 mg/dL (ref 8.7–10.2)
Chloride: 100 mmol/L (ref 96–106)
Creatinine, Ser: 1.34 mg/dL — ABNORMAL HIGH (ref 0.76–1.27)
Globulin, Total: 2.3 g/dL (ref 1.5–4.5)
Glucose: 187 mg/dL — ABNORMAL HIGH (ref 70–99)
Potassium: 4.7 mmol/L (ref 3.5–5.2)
Sodium: 138 mmol/L (ref 134–144)
Total Protein: 7.3 g/dL (ref 6.0–8.5)
eGFR: 63 mL/min/{1.73_m2} (ref >59)

## 2022-04-16 LAB — HEMOGLOBIN A1C
Est. average glucose Bld gHb Est-mCnc: 197 mg/dL
Hgb A1c MFr Bld: 8.5 % — ABNORMAL HIGH (ref 4.8–5.6)

## 2022-04-16 LAB — MICROALBUMIN / CREATININE URINE RATIO
Creatinine, Urine: 104.7 mg/dL
Microalb/Creat Ratio: 13 mg/g creat (ref 0–29)
Microalbumin, Urine: 13.4 ug/mL

## 2022-04-16 MED ORDER — ATORVASTATIN CALCIUM 80 MG PO TABS
ORAL_TABLET | ORAL | 1 refills | Status: DC
Start: 2022-04-16 — End: 2023-04-08

## 2022-04-20 ENCOUNTER — Encounter: Payer: Self-pay | Admitting: Nurse Practitioner

## 2022-04-22 ENCOUNTER — Telehealth: Payer: Self-pay

## 2022-04-22 NOTE — Telephone Encounter (Signed)
Called Mr. Macek to inform him Ozempic came in and was ready to be picked up. Left a non-detailed voice message to call back.   [name], CPP, PharmD Clinical Pharmacist Practitioner [facility] [phone]

## 2022-04-23 ENCOUNTER — Ambulatory Visit (INDEPENDENT_AMBULATORY_CARE_PROVIDER_SITE_OTHER): Payer: Medicare PPO

## 2022-04-23 VITALS — BP 102/50 | HR 77 | Temp 98.0°F | Ht 66.2 in | Wt 236.0 lb

## 2022-04-23 DIAGNOSIS — Z0000 Encounter for general adult medical examination without abnormal findings: Secondary | ICD-10-CM | POA: Diagnosis not present

## 2022-04-23 NOTE — Progress Notes (Signed)
Subjective:   Stephen Chase is a 56 y.o. male who presents for Medicare Annual/Subsequent preventive examination.  Review of Systems     Cardiac Risk Factors include: advanced age (>52mn, >>11women);diabetes mellitus;male gender;obesity (BMI >30kg/m2)     Objective:    Today's Vitals   04/23/22 0828  BP: (!) 102/50  Pulse: 77  Temp: 98 F (36.7 C)  TempSrc: Oral  SpO2: 97%  Weight: 236 lb (107 kg)  Height: 5' 6.2" (1.681 m)   Body mass index is 37.86 kg/m.     04/23/2022    8:48 AM 10/28/2021    2:46 PM 04/17/2021    9:27 AM 04/10/2020   11:07 AM 03/09/2019    1:00 PM 12/19/2013    9:35 AM  Advanced Directives  Does Patient Have a Medical Advance Directive? Yes No Yes Yes No Patient does not have advance directive;Patient would like information  Type of Advance Directive [place]  Healthcare Power of [place]    Copy of [place]? Yes - validated most recent copy scanned in chart (See row information)  Yes - validated most recent copy scanned in chart (See row information) Yes - validated most recent copy scanned in chart (See row information)    Would patient like information on creating a medical advance directive?  No - Patient declined   No - Patient declined Advance directive packet given    Current Medications (verified) Outpatient Encounter Medications as of 04/23/2022  Medication Sig   Accu-Chek Softclix Lancets lancets Use as instructed to test blood sugars 3 times a day dx code e11.65   aspirin EC 81 MG tablet Take 81 mg by mouth daily.   atorvastatin (LIPITOR) 80 MG tablet TAKE 1 TABLET(40 MG) BY MOUTH AT BEDTIME   Blood Glucose Monitoring Suppl (ACCU-CHEK GUIDE) w/Device KIT Use to check blood sugars three times daily E11.9   carvedilol (COREG) 6.25 MG tablet TAKE 1 TABLET(6.25 MG) BY MOUTH TWICE DAILY WITH A MEAL   cholecalciferol (VITAMIN D3) 25 MCG (1000 UNIT)  tablet Take 1,000 Units by mouth daily.   cyclobenzaprine (FLEXERIL) 10 MG tablet Take 1 tablet (10 mg total) by mouth 3 (three) times daily as needed for muscle spasms.   empagliflozin (JARDIANCE) 10 MG TABS tablet Take 1 tablet (10 mg total) by mouth daily before breakfast.   fenofibrate (TRICOR) 145 MG tablet TAKE 1 TABLET(145 MG) BY MOUTH DAILY   glucose blood (ACCU-CHEK GUIDE) test strip CHECK BLOOD SUGAR 3 TIMES A DAY BEFORE BREAKFAST, LUNCH, AND DINNER   glucose blood test strip Use as instructed to test blood sugars 3 times a day dx code e11.65   Insulin Pen Needle (PEN NEEDLES) 32G X 4 MM MISC Inject 1 each into the skin daily.   Magnesium 250 MG TABS Take 1 tablet (250 mg total) by mouth every evening. With evening meals   modafinil (PROVIGIL) 200 MG tablet TAKE 1 TABLET(200 MG) BY MOUTH DAILY   naproxen (NAPROSYN) 375 MG tablet Take 1 tablet (375 mg total) by mouth 2 (two) times daily with a meal.   olmesartan-hydrochlorothiazide (BENICAR HCT) 40-25 MG tablet Take 1 tablet by mouth daily.   Omega-3 Fatty Acids (FISH OIL) 1000 MG CPDR Take by mouth. Take one tablet daily   omeprazole (PRILOSEC) 10 MG capsule TAKE 1 CAPSULE(10 MG) BY MOUTH DAILY   Semaglutide, 2 MG/DOSE, (OZEMPIC, 2 MG/DOSE,) 8 MG/3ML SOPN Inject 2 mg into  the skin once a week.   tamsulosin (FLOMAX) 0.4 MG CAPS capsule TAKE 1 CAPSULE BY MOUTH EVERY DAY 30 MINUTES AFTER THE SAME MEAL   tiZANidine (ZANAFLEX) 4 MG tablet Take 1 tablet (4 mg total) by mouth at bedtime.   UNABLE TO FIND CPAP for OSA (6cm-19cm)   Cyanocobalamin (B-12 PO) Take by mouth daily. (Patient not taking: Reported on 04/23/2022)   Icosapent Ethyl (VASCEPA) 1 g CAPS One bid for elevated triglycerides (Patient not taking: Reported on 04/23/2022)   Multiple Vitamins-Minerals (MULTIVITAMIN WITH MINERALS) tablet Take 1 tablet by mouth daily. (Patient not taking: Reported on 04/23/2022)   OVER THE COUNTER MEDICATION Vitamin D 3 One capsule daily. (Patient not  taking: Reported on 04/23/2022)   No facility-administered encounter medications on file as of 04/23/2022.    Allergies (verified) Patient has no known allergies.   History: Past Medical History:  Diagnosis Date   Allergy    Arthritis    SHOULDERS   Balanitis    Deafness, sensorineural, childhood onset    BILATERAL SINCE AGE 34  SECONDARY TO UNKNOWN ILLNESS   GERD (gastroesophageal reflux disease)    Hyperlipidemia    Hypertension    No blood products    Patient is Jehovah Witness no blood products.   OSA on CPAP    SEVERE PER STUDY 03-09-2009   Sleep apnea    uses a cpap   Smokers' cough ([place])    Type 2 diabetes mellitus ([place])    Past Surgical History:  Procedure Laterality Date   CIRCUMCISION N/A 12/19/2013   Procedure: CIRCUMCISION ADULT;  Surgeon: Lowella Bandy, MD;  Location: [facility];  Service: Urology;  Laterality: N/A;   SHOULDER SURGERY Right    SHOULDER SURGERY Left 10/15/2021   WRIST SURGERY Left    Family History  Problem Relation Age of Onset   Cancer Mother        type unknown pt was 81 years old at that time   Liver disease Father    Healthy Brother    Cancer Other         alot of family members have cancer but doesnt know the type   Colon cancer Neg Hx    Esophageal cancer Neg Hx    Rectal cancer Neg Hx    Stomach cancer Neg Hx    Sleep apnea Neg Hx    Social History   Socioeconomic History   Marital status: Married    Spouse name: Not on file   Number of children: 2   Years of education: Not on file   Highest education level: Not on file  Occupational History   Occupation: unemployed  Tobacco Use   Smoking status: Former    Packs/day: 1.00    Years: 30.00    Total pack years: 30.00    Types: Cigarettes   Smokeless tobacco: Never   Tobacco comments:    quit 4 years ago  Vaping Use   Vaping Use: Never used  Substance and Sexual Activity   Alcohol use: Not Currently    Comment: rarely   Drug use: No   Sexual activity:  Yes  Other Topics Concern   Not on file  Social History Narrative   Not on file   Social Determinants of Health   Financial Resource Strain: Low Risk  (04/23/2022)   Overall Financial Resource Strain (CARDIA)    Difficulty of Paying Living Expenses: Not hard at all  Food Insecurity: No Food Insecurity (04/23/2022)  Hunger Vital Sign    Worried About Running Out of Food in the Last Year: Never true    Ran Out of Food in the Last Year: Never true  Transportation Needs: No Transportation Needs (04/23/2022)   PRAPARE - Hydrologist (Medical): No    Lack of Transportation (Non-Medical): No  Physical Activity: Inactive (04/23/2022)   Exercise Vital Sign    Days of Exercise per Week: 0 days    Minutes of Exercise per Session: 0 min  Stress: No Stress Concern Present (04/23/2022)   [place]    Feeling of Stress : Not at all  Social Connections: Not on file    Tobacco Counseling Counseling given: Not Answered Tobacco comments: quit 4 years ago   Clinical Intake:  Pre-visit preparation completed: Yes  Pain : No/denies pain     Nutritional Status: BMI > 30  Obese Nutritional Risks: None Diabetes: Yes  How often do you need to have someone help you when you read instructions, pamphlets, or other written materials from your doctor or pharmacy?: 1 - Never What is the last grade level you completed in school?: 12th grade  Diabetic? Yes Nutrition Risk Assessment:  Has the patient had any N/V/D within the last 2 months?  No  Does the patient have any non-healing wounds?  No  Has the patient had any unintentional weight loss or weight gain?  No   Diabetes:  Is the patient diabetic?  Yes  If diabetic, was a CBG obtained today?  No  Did the patient bring in their glucometer from home?  No  How often do you monitor your CBG's? Twice daily.   Financial Strains and Diabetes  Management:  Are you having any financial strains with the device, your supplies or your medication? No .  Does the patient want to be seen by Chronic Care Management for management of their diabetes?  No  Would the patient like to be referred to a Nutritionist or for Diabetic Management?  No   Diabetic Exams:  Diabetic Eye Exam: Completed 08/05/2021 Diabetic Foot Exam: Completed 04/15/2022   Interpreter Needed?: Yes Interpreter Agency: [employer] Interpreter Name: Sammuel Cooper  Information entered by :: NAllen LPN   Activities of Daily Living    04/23/2022    8:53 AM  In your present state of health, do you have any difficulty performing the following activities:  Hearing? 1  Vision? 0  Difficulty concentrating or making decisions? 0  Walking or climbing stairs? 0  Dressing or bathing? 0  Doing errands, shopping? 0  Preparing Food and eating ? N  Using the Toilet? N  In the past six months, have you accidently leaked urine? N  Do you have problems with loss of bowel control? N  Managing your Medications? N  Managing your Finances? N  Housekeeping or managing your Housekeeping? N    Patient Care Team: [name], FNP as PCP - General ([place]) [place], Kennieth Francois, [facility] (Inactive) (Pharmacist)  Indicate any recent Medical Services you may have received from other than [facility] providers in the past year (date may be approximate).     Assessment:   This is a routine wellness examination for Stephen Chase.  Hearing/Vision screen Vision Screening - Comments:: Regular eye exams,  Dietary issues and exercise activities discussed:     Goals Addressed             This Visit's Progress  Patient Stated       04/23/2022, no goals       Depression Screen    04/23/2022    8:53 AM 04/15/2022   10:44 AM 04/17/2021    9:29 AM 04/10/2020   11:09 AM 04/20/2019    8:39 AM 03/09/2019    1:01 PM 03/09/2019   11:48 AM  PHQ 2/9 Scores  PHQ - 2 Score 0 0 0  0 0 0 0    Fall Risk    04/23/2022    8:50 AM 04/15/2022   10:44 AM 04/17/2021    9:28 AM 04/10/2020   11:08 AM 06/15/2019    9:41 AM  Fall Risk   Falls in the past year? 1 0 1 0 0  Comment slipped taking the dog out  did not clear a step    Number falls in past yr: 0 0 0    Injury with Fall? 0 0 0    Risk for fall due to : Medication side effect No Fall Risks Medication side effect Medication side effect   Follow up Falls evaluation completed;Education provided;Falls prevention discussed Falls evaluation completed Falls evaluation completed;Education provided;Falls prevention discussed Falls evaluation completed;Education provided;Falls prevention discussed     FALL RISK PREVENTION PERTAINING TO THE HOME:  Any stairs in or around the home? No  If so, are there any without handrails? N/a Home free of loose throw rugs in walkways, pet beds, electrical cords, etc? Yes  Adequate lighting in your home to reduce risk of falls? Yes   ASSISTIVE DEVICES UTILIZED TO PREVENT FALLS:  Life alert? No  Use of a cane, walker or w/c? No  Grab bars in the bathroom? No  Shower chair or bench in shower? No  Elevated toilet seat or a handicapped toilet? No   TIMED UP AND GO:  Was the test performed? No .     Gait steady and fast without use of assistive device  Cognitive Function:        Immunizations Immunization History  Administered Date(s) Administered   Hepatitis B, adult 03/03/2017   Hepb-cpg 07/13/2018, 08/17/2018, 05/12/2019   Influenza,inj,Quad PF,6+ Mos 07/13/2018, 05/31/2019, 07/17/2020, 08/11/2021   Janssen (J&J) SARS-COV-2 Vaccination 12/28/2019   Moderna Covid-19 Vaccine Bivalent Booster 65yr & up 09/16/2021   Moderna Sars-Covid-2 Vaccination 08/06/2020, 04/25/2021   Pneumococcal Polysaccharide-23 08/11/2021    TDAP status: Due, Education has been provided regarding the importance of this vaccine. Advised may receive this vaccine at local pharmacy or Health Dept. Aware  to provide a copy of the vaccination record if obtained from local pharmacy or Health Dept. Verbalized acceptance and understanding.  Flu Vaccine status: Up to date  Pneumococcal vaccine status: Up to date  Covid-19 vaccine status: Completed vaccines  Qualifies for Shingles Vaccine? Yes   Zostavax completed No   Shingrix Completed?: No.    Education has been provided regarding the importance of this vaccine. Patient has been advised to call insurance company to determine out of pocket expense if they have not yet received this vaccine. Advised may also receive vaccine at local pharmacy or Health Dept. Verbalized acceptance and understanding.  Screening Tests Health Maintenance  Topic Date Due   COLONOSCOPY (Pts 45-464yrInsurance coverage will need to be confirmed)  02/03/2022   INFLUENZA VACCINE  04/21/2022   Zoster Vaccines- Shingrix (1 of 2) 07/16/2022 (Originally 01/08/2017)   TETANUS/TDAP  04/16/2023 (Originally 01/08/1986)   OPHTHALMOLOGY EXAM  08/05/2022   HEMOGLOBIN A1C  10/16/2022   FOOT EXAM  04/16/2023   COVID-19 Vaccine  Completed   Hepatitis C Screening  Completed   HIV Screening  Completed   HPV VACCINES  Aged Out    Health Maintenance  Health Maintenance Due  Topic Date Due   COLONOSCOPY (Pts 45-92yr Insurance coverage will need to be confirmed)  02/03/2022   INFLUENZA VACCINE  04/21/2022    Colorectal cancer screening: Type of screening: Colonoscopy. Completed 02/03/2017. Repeat every 5 years  Lung Cancer Screening: (Low Dose CT Chest recommended if Age 55-80years, 30 pack-year currently smoking OR have quit w/in 15years.) does not qualify.   Lung Cancer Screening Referral: no  Additional Screening:  Hepatitis C Screening: does qualify; Completed 02/05/2017  Vision Screening: Recommended annual ophthalmology exams for early detection of glaucoma and other disorders of the eye. Is the patient up to date with their annual eye exam?  Yes  Who is the provider  or what is the name of the office in which the patient attends annual eye exams?  If pt is not established with a provider, would they like to be referred to a provider to establish care? No .   Dental Screening: Recommended annual dental exams for proper oral hygiene  Community Resource Referral / Chronic Care Management: CRR required this visit?  No   CCM required this visit?  No      Plan:     I have personally reviewed and noted the following in the patient's chart:   Medical and social history Use of alcohol, tobacco or illicit drugs  Current medications and supplements including opioid prescriptions. Patient is not currently taking opioid prescriptions. Functional ability and status Nutritional status Physical activity Advanced directives List of other physicians Hospitalizations, surgeries, and ER visits in previous 12 months Vitals Screenings to include cognitive, depression, and falls Referrals and appointments  In addition, I have reviewed and discussed with patient certain preventive protocols, quality metrics, and best practice recommendations. A written personalized care plan for preventive services as well as general preventive health recommendations were provided to patient.     NKellie Simmering LPN   [id number]  Nurse Notes: 6 CIT not administered. Patient is deaf.

## 2022-04-23 NOTE — Patient Instructions (Signed)
[name] , Thank you for taking time to come for your Medicare Wellness Visit. I appreciate your ongoing commitment to your health goals. Please review the following plan we discussed and let me know if I can assist you in the future.   Screening recommendations/referrals: Colonoscopy: completed 02/03/2017 Recommended yearly ophthalmology/optometry visit for glaucoma screening and checkup Recommended yearly dental visit for hygiene and checkup  Vaccinations: Influenza vaccine: due Pneumococcal vaccine: n/a Tdap vaccine: due Shingles vaccine: discussed   Covid-19:  09/16/2021, 04/25/2021, 08/06/2020, 12/28/2019  Advanced directives: copy in chart  Conditions/risks identified: none  Next appointment: Follow up in one year for your annual wellness visit   Preventive Care 40-64 Years, Male Preventive care refers to lifestyle choices and visits with your health care provider that can promote health and wellness. What does preventive care include? A yearly physical exam. This is also called an annual well check. Dental exams once or twice a year. Routine eye exams. Ask your health care provider how often you should have your eyes checked. Personal lifestyle choices, including: Daily care of your teeth and gums. Regular physical activity. Eating a healthy diet. Avoiding tobacco and drug use. Limiting alcohol use. Practicing safe sex. Taking low-dose aspirin every day starting at age 29. What happens during an annual well check? The services and screenings done by your health care provider during your annual well check will depend on your age, overall health, lifestyle risk factors, and family history of disease. Counseling  Your health care provider may ask you questions about your: Alcohol use. Tobacco use. Drug use. Emotional well-being. Home and relationship well-being. Sexual activity. Eating habits. Work and work Statistician. Screening  You may have the following tests or  measurements: Height, weight, and BMI. Blood pressure. Lipid and cholesterol levels. These may be checked every 5 years, or more frequently if you are over 46 years old. Skin check. Lung cancer screening. You may have this screening every year starting at age 25 if you have a 30-pack-year history of smoking and currently smoke or have quit within the past 15 years. Fecal occult blood test (FOBT) of the stool. You may have this test every year starting at age 56. Flexible sigmoidoscopy or colonoscopy. You may have a sigmoidoscopy every 5 years or a colonoscopy every 10 years starting at age 46. Prostate cancer screening. Recommendations will vary depending on your family history and other risks. Hepatitis C blood test. Hepatitis B blood test. Sexually transmitted disease (STD) testing. Diabetes screening. This is done by checking your blood sugar (glucose) after you have not eaten for a while (fasting). You may have this done every 1-3 years. Discuss your test results, treatment options, and if necessary, the need for more tests with your health care provider. Vaccines  Your health care provider may recommend certain vaccines, such as: Influenza vaccine. This is recommended every year. Tetanus, diphtheria, and acellular pertussis (Tdap, Td) vaccine. You may need a Td booster every 10 years. Zoster vaccine. You may need this after age 41. Pneumococcal 13-valent conjugate (PCV13) vaccine. You may need this if you have certain conditions and have not been vaccinated. Pneumococcal polysaccharide (PPSV23) vaccine. You may need one or two doses if you smoke cigarettes or if you have certain conditions. Talk to your health care provider about which screenings and vaccines you need and how often you need them. This information is not intended to replace advice given to you by your health care provider. Make sure you discuss any questions you  have with your health care provider. Document Released:  10/04/2015 Document Revised: 05/27/2016 Document Reviewed: 07/09/2015 Elsevier Interactive Patient Education  2017 [place] Prevention in the Home Falls can cause injuries. They can happen to people of all ages. There are many things you can do to make your home safe and to help prevent falls. What can I do on the outside of my home? Regularly fix the edges of walkways and driveways and fix any cracks. Remove anything that might make you trip as you walk through a door, such as a raised step or threshold. Trim any bushes or trees on the path to your home. Use bright outdoor lighting. Clear any walking paths of anything that might make someone trip, such as rocks or tools. Regularly check to see if handrails are loose or broken. Make sure that both sides of any steps have handrails. Any raised decks and porches should have guardrails on the edges. Have any leaves, snow, or ice cleared regularly. Use sand or salt on walking paths during winter. Clean up any spills in your garage right away. This includes oil or grease spills. What can I do in the bathroom? Use night lights. Install grab bars by the toilet and in the tub and shower. Do not use towel bars as grab bars. Use non-skid mats or decals in the tub or shower. If you need to sit down in the shower, use a plastic, non-slip stool. Keep the floor dry. Clean up any water that spills on the floor as soon as it happens. Remove soap buildup in the tub or shower regularly. Attach bath mats securely with double-sided non-slip rug tape. Do not have throw rugs and other things on the floor that can make you trip. What can I do in the bedroom? Use night lights. Make sure that you have a light by your bed that is easy to reach. Do not use any sheets or blankets that are too big for your bed. They should not hang down onto the floor. Have a firm chair that has side arms. You can use this for support while you get dressed. Do not have  throw rugs and other things on the floor that can make you trip. What can I do in the kitchen? Clean up any spills right away. Avoid walking on wet floors. Keep items that you use a lot in easy-to-reach places. If you need to reach something above you, use a strong step stool that has a grab bar. Keep electrical cords out of the way. Do not use floor polish or wax that makes floors slippery. If you must use wax, use non-skid floor wax. Do not have throw rugs and other things on the floor that can make you trip. What can I do with my stairs? Do not leave any items on the stairs. Make sure that there are handrails on both sides of the stairs and use them. Fix handrails that are broken or loose. Make sure that handrails are as long as the stairways. Check any carpeting to make sure that it is firmly attached to the stairs. Fix any carpet that is loose or worn. Avoid having throw rugs at the top or bottom of the stairs. If you do have throw rugs, attach them to the floor with carpet tape. Make sure that you have a light switch at the top of the stairs and the bottom of the stairs. If you do not have them, ask someone to add them for you.  What else can I do to help prevent falls? Wear shoes that: Do not have high heels. Have rubber bottoms. Are comfortable and fit you well. Are closed at the toe. Do not wear sandals. If you use a stepladder: Make sure that it is fully opened. Do not climb a closed stepladder. Make sure that both sides of the stepladder are locked into place. Ask someone to hold it for you, if possible. Clearly mark and make sure that you can see: Any grab bars or handrails. First and last steps. Where the edge of each step is. Use tools that help you move around (mobility aids) if they are needed. These include: Canes. Walkers. Scooters. Crutches. Turn on the lights when you go into a dark area. Replace any light bulbs as soon as they burn out. Set up your furniture so  you have a clear path. Avoid moving your furniture around. If any of your floors are uneven, fix them. If there are any pets around you, be aware of where they are. Review your medicines with your doctor. Some medicines can make you feel dizzy. This can increase your chance of falling. Ask your doctor what other things that you can do to help prevent falls. This information is not intended to replace advice given to you by your health care provider. Make sure you discuss any questions you have with your health care provider. Document Released: 07/04/2009 Document Revised: 02/13/2016 Document Reviewed: 10/12/2014 Elsevier Interactive Patient Education  2017 [employer].

## 2022-05-01 ENCOUNTER — Telehealth: Payer: Self-pay

## 2022-05-01 NOTE — Chronic Care Management (AMB) (Signed)
Novo Nordisk patient assistance program notification:  Patient has 0  refill remaining and enrollment will expire on 08/20/2022. The next refill for patient will be fulfilled on 06/20/2022.   Filling out reorder form for Ozempic '2mg'$  to continue refills until end of enrollment year.  [name], [place] Pharmacist Assistant [phone]

## 2022-05-04 ENCOUNTER — Other Ambulatory Visit: Payer: Self-pay | Admitting: Adult Health

## 2022-05-04 DIAGNOSIS — M5451 Vertebrogenic low back pain: Secondary | ICD-10-CM | POA: Diagnosis not present

## 2022-05-06 ENCOUNTER — Telehealth: Payer: Self-pay

## 2022-05-07 ENCOUNTER — Other Ambulatory Visit: Payer: Self-pay | Admitting: Adult Health

## 2022-05-07 ENCOUNTER — Other Ambulatory Visit: Payer: Self-pay | Admitting: *Deleted

## 2022-05-07 MED ORDER — MODAFINIL 200 MG PO TABS: 200.0000 mg | ORAL_TABLET | Freq: Every day | ORAL | 5 refills | Status: DC

## 2022-05-07 NOTE — Telephone Encounter (Signed)
Pt is needing a refill request for his modafinil (PROVIGIL) 200 MG tablet sent in to the [facility] on [address]  Pt is leaving on vacation for a week this Sat and is needing the medication by then. Please advise.

## 2022-05-07 NOTE — Chronic Care Management (AMB) (Signed)
[employer] reorder form for Ozempic 2 mg.  [name], [place] Pharmacist Assistant [phone]

## 2022-05-28 ENCOUNTER — Encounter: Payer: Self-pay | Admitting: Nurse Practitioner

## 2022-06-04 ENCOUNTER — Encounter: Payer: Self-pay | Admitting: Nurse Practitioner

## 2022-06-04 ENCOUNTER — Other Ambulatory Visit: Payer: Self-pay

## 2022-06-04 MED ORDER — GLUCOSE BLOOD VI STRP: ORAL_STRIP | 12 refills | Status: DC

## 2022-06-17 ENCOUNTER — Encounter: Payer: Self-pay | Admitting: Gastroenterology

## 2022-06-17 ENCOUNTER — Ambulatory Visit (INDEPENDENT_AMBULATORY_CARE_PROVIDER_SITE_OTHER): Payer: Medicare PPO | Admitting: Nurse Practitioner

## 2022-06-17 ENCOUNTER — Encounter: Payer: Self-pay | Admitting: Nurse Practitioner

## 2022-06-17 VITALS — BP 110/62 | HR 87 | Temp 98.4°F | Ht 66.0 in | Wt 233.0 lb

## 2022-06-17 DIAGNOSIS — J029 Acute pharyngitis, unspecified: Secondary | ICD-10-CM | POA: Diagnosis not present

## 2022-06-17 DIAGNOSIS — R1319 Other dysphagia: Secondary | ICD-10-CM

## 2022-06-17 DIAGNOSIS — Z23 Encounter for immunization: Secondary | ICD-10-CM

## 2022-06-17 MED ORDER — LIDOCAINE VISCOUS HCL 2 % MT SOLN: 15.0000 mL | OROMUCOSAL | 0 refills | Status: DC | PRN

## 2022-06-17 NOTE — Progress Notes (Signed)
Barnet Glasgow Martin,acting as a Education administrator for [name], FNP.,have documented all relevant documentation on the behalf of [name], FNP,as directed by  [name], FNP while in the presence of [name], [place].    Subjective:     Patient ID: Stephen Chase , male    DOB: [birth year] , 56 y.o.   MRN: [id number]   Chief Complaint  Patient presents with   Sore Throat    HPI  Patient presents today for swollen throat, he states it has been sore for about a month. He states it could be from a cold.  Has been ongoing for the last month. Denies post nasal drainage. He has been to GI due to not being able to eat or drink - had esophageal stretching in 2020. When he had done it felt better. He feels like his throat is closing in, he is drinking fluids slowly and eating more slowly and chewing more.   Patient presents with his interpreter ED.      Past Medical History:  Diagnosis Date   Allergy    Arthritis    SHOULDERS   Balanitis    Deafness, sensorineural, childhood onset    BILATERAL SINCE AGE 75  SECONDARY TO UNKNOWN ILLNESS   GERD (gastroesophageal reflux disease)    Hyperlipidemia    Hypertension    No blood products    Patient is Jehovah Witness no blood products.   OSA on CPAP    SEVERE PER STUDY 03-09-2009   Sleep apnea    uses a cpap   Smokers' cough ([place])    Type 2 diabetes mellitus ([place])      Family History  Problem Relation Age of Onset   Cancer Mother        type unknown pt was 12 years old at that time   Liver disease Father    Healthy Brother    Cancer Other         alot of family members have cancer but doesnt know the type   Colon cancer Neg Hx    Esophageal cancer Neg Hx    Rectal cancer Neg Hx    Stomach cancer Neg Hx    Sleep apnea Neg Hx      Current Outpatient Medications:    Accu-Chek Softclix Lancets lancets, Use as instructed to test blood sugars 3 times a day dx code e11.65, Disp: 200 each, Rfl: 3   aspirin EC 81 MG tablet, Take 81 mg by  mouth daily., Disp: , Rfl:    atorvastatin (LIPITOR) 80 MG tablet, TAKE 1 TABLET(40 MG) BY MOUTH AT BEDTIME, Disp: 90 tablet, Rfl: 1   Blood Glucose Monitoring Suppl (ACCU-CHEK GUIDE) w/Device KIT, Use to check blood sugars three times daily E11.9, Disp: 1 kit, Rfl: 0   carvedilol (COREG) 6.25 MG tablet, TAKE 1 TABLET(6.25 MG) BY MOUTH TWICE DAILY WITH A MEAL, Disp: 180 tablet, Rfl: 1   cholecalciferol (VITAMIN D3) 25 MCG (1000 UNIT) tablet, Take 1,000 Units by mouth daily., Disp: , Rfl:    cyclobenzaprine (FLEXERIL) 10 MG tablet, Take 1 tablet (10 mg total) by mouth 3 (three) times daily as needed for muscle spasms., Disp: 30 tablet, Rfl: 0   empagliflozin (JARDIANCE) 10 MG TABS tablet, Take 1 tablet (10 mg total) by mouth daily before breakfast., Disp: 90 tablet, Rfl: 1   fenofibrate (TRICOR) 145 MG tablet, TAKE 1 TABLET(145 MG) BY MOUTH DAILY, Disp: 90 tablet, Rfl: 1   glucose blood (ACCU-CHEK GUIDE) test strip, CHECK  BLOOD SUGAR 3 TIMES A DAY BEFORE BREAKFAST, LUNCH, AND DINNER, Disp: 150 strip, Rfl: 2   glucose blood test strip, Use as instructed to test blood sugars 3 times a day dx code e11.65, Disp: 100 each, Rfl: 12   Insulin Pen Needle (PEN NEEDLES) 32G X 4 MM MISC, Inject 1 each into the skin daily., Disp: 30 each, Rfl: 2   lidocaine (XYLOCAINE) 2 % solution, Use as directed 15 mLs in the mouth or throat as needed for mouth pain., Disp: 100 mL, Rfl: 0   Magnesium 250 MG TABS, Take 1 tablet (250 mg total) by mouth every evening. With evening meals, Disp: 30 tablet, Rfl: 2   modafinil (PROVIGIL) 200 MG tablet, Take 1 tablet (200 mg total) by mouth daily., Disp: 30 tablet, Rfl: 5   naproxen (NAPROSYN) 375 MG tablet, Take 1 tablet (375 mg total) by mouth 2 (two) times daily with a meal., Disp: 30 tablet, Rfl: 0   olmesartan-hydrochlorothiazide (BENICAR HCT) 40-25 MG tablet, Take 1 tablet by mouth daily., Disp: 90 tablet, Rfl: 1   Omega-3 Fatty Acids (FISH OIL) 1000 MG CPDR, Take by mouth. Take  one tablet daily, Disp: , Rfl:    omeprazole (PRILOSEC) 10 MG capsule, TAKE 1 CAPSULE(10 MG) BY MOUTH DAILY, Disp: 90 capsule, Rfl: 1   Semaglutide, 2 MG/DOSE, (OZEMPIC, 2 MG/DOSE,) 8 MG/3ML SOPN, Inject 2 mg into the skin once a week., Disp: 3 mL, Rfl: 1   tamsulosin (FLOMAX) 0.4 MG CAPS capsule, TAKE 1 CAPSULE BY MOUTH EVERY DAY 30 MINUTES AFTER THE SAME MEAL, Disp: 90 capsule, Rfl: 1   tiZANidine (ZANAFLEX) 4 MG tablet, Take 1 tablet (4 mg total) by mouth at bedtime., Disp: 30 tablet, Rfl: 0   UNABLE TO FIND, CPAP for OSA (6cm-19cm), Disp: , Rfl:    Cyanocobalamin (B-12 PO), Take by mouth daily. (Patient not taking: Reported on 04/23/2022), Disp: , Rfl:    Icosapent Ethyl (VASCEPA) 1 g CAPS, One bid for elevated triglycerides (Patient not taking: Reported on 04/23/2022), Disp: 180 capsule, Rfl: 0   Multiple Vitamins-Minerals (MULTIVITAMIN WITH MINERALS) tablet, Take 1 tablet by mouth daily. (Patient not taking: Reported on 04/23/2022), Disp: , Rfl:    OVER THE COUNTER MEDICATION, Vitamin D 3 One capsule daily. (Patient not taking: Reported on 04/23/2022), Disp: , Rfl:    No Known Allergies   Review of Systems  Constitutional: Negative.   HENT: Negative.    Eyes: Negative.   Respiratory: Negative.    Cardiovascular: Negative.   Gastrointestinal: Negative.   Psychiatric/Behavioral: Negative.       Today's Vitals   06/17/22 1010  BP: 110/62  Pulse: 87  Temp: 98.4 F (36.9 C)  TempSrc: Oral  Weight: 233 lb (105.7 kg)  Height: _0  (1.676 m)  PainSc: 5   PainLoc: Throat   Body mass index is 37.61 kg/m.   Objective:  Physical Exam Vitals reviewed.  Constitutional:      General: He is not in acute distress.    Appearance: Normal appearance. He is well-developed. He is obese.  HENT:     Mouth/Throat:     Tonsils: No tonsillar exudate or tonsillar abscesses.  Cardiovascular:     Rate and Rhythm: Normal rate and regular rhythm.     Pulses: Normal pulses.     Heart sounds: Normal  heart sounds. No murmur heard. Pulmonary:     Effort: Pulmonary effort is normal. No respiratory distress.     Breath sounds: Normal breath sounds.  Musculoskeletal:        General: No swelling.  Skin:    General: Skin is warm and dry.     Capillary Refill: Capillary refill takes less than 2 seconds.  Neurological:     General: No focal deficit present.     Mental Status: He is alert and oriented to person, place, and time.  Psychiatric:        Mood and Affect: Mood normal.        Behavior: Behavior normal.        Thought Content: Thought content normal.        Judgment: Judgment normal.         Assessment And Plan:     1. Sore throat Comments: Negative rapid strep. Will provide viscous lidocaine advised to take while awake and to not eat or drink during that time as it will numb the throat.  - POCT rapid strep A - lidocaine (XYLOCAINE) 2 % solution; Use as directed 15 mLs in the mouth or throat as needed for mouth pain.  Dispense: 100 mL; Refill: 0  2. Other dysphagia Comments: Has had esophageal stretching, this may be occuring again. Will refer to GI for further evaluation Dr. Luvenia Starch.  - Ambulatory referral to Gastroenterology  3. Need for influenza vaccination Influenza vaccine administered Encouraged to take Tylenol as needed for fever or muscle aches. - Flu Vaccine QUAD 6+ mos PF IM (Fluarix Quad PF)    Will send rx for viscous lidocaine until he can get in with GI provider.   Patient was given opportunity to ask questions. Patient verbalized understanding of the plan and was able to repeat key elements of the plan. All questions were answered to their satisfaction.  [name], FNP   I, [name], FNP, have reviewed all documentation for this visit. The documentation on 06/17/22 for the exam, diagnosis, procedures, and orders are all accurate and complete.   IF YOU HAVE BEEN REFERRED TO A SPECIALIST, IT MAY TAKE 1-2 WEEKS TO SCHEDULE/PROCESS THE REFERRAL.  IF YOU HAVE NOT HEARD FROM US/SPECIALIST IN TWO WEEKS, PLEASE GIVE [place] A CALL AT [phone].   THE PATIENT IS ENCOURAGED TO PRACTICE SOCIAL DISTANCING DUE TO THE COVID-19 PANDEMIC.

## 2022-06-17 NOTE — Patient Instructions (Signed)
Sore Throat When you have a sore throat, your throat may feel: Tender. Burning. Irritated. Scratchy. Painful when you swallow. Painful when you talk. Many things can cause a sore throat, such as: An infection. Allergies. Dry air. Smoke or pollution. Radiation treatment for cancer. Gastroesophageal reflux disease (GERD). A tumor. A sore throat can be the first sign of another sickness. It can happen with other problems, like: Coughing. Sneezing. Fever. Swelling of the glands in the neck. Most sore throats go away without treatment. Follow these instructions at home:     Medicines Take over-the-counter and prescription medicines only as told by your doctor. Children often get sore throats. Do not give your child aspirin. Use throat sprays to soothe your throat as told by your health care provider. Managing pain To help with pain: Sip warm liquids, such as broth, herbal tea, or warm water. Eat or drink cold or frozen liquids, such as frozen ice pops. Rinse your mouth (gargle) with a salt water mixture 3-4 times a day or as needed. To make salt water, dissolve -1 tsp (3-6 g) of salt in 1 cup (237 mL) of warm water. Do not swallow this mixture. Suck on hard candy or throat lozenges. Put a cool-mist humidifier in your bedroom at night. Sit in the bathroom with the door closed for 5-10 minutes while you run hot water in the shower. General instructions Do not smoke or use any products that contain nicotine or tobacco. If you need help quitting, ask your doctor. Get plenty of rest. Drink enough fluid to keep your pee (urine) pale yellow. Wash your hands often for at least 20 seconds with soap and water. If soap and water are not available, use hand sanitizer. Contact a doctor if: You have a fever for more than 2-3 days. You keep having symptoms for more than 2-3 days. Your throat does not get better in 7 days. You have a fever and your symptoms suddenly get worse. Your  child who is 3 months to 3 years old has a temperature of 102.2F (39C) or higher. Get help right away if: You have trouble breathing. You cannot swallow fluids, soft foods, or your spit. You have swelling in your throat or neck that gets worse. You feel like you may vomit (nauseous) and this feeling lasts a long time. You cannot stop vomiting. These symptoms may be an emergency. Get help right away. Call your local emergency services (911 in the [place] Do not wait to see if the symptoms will go away. Do not drive yourself to the hospital. Summary A sore throat is a painful, burning, irritated, or scratchy throat. Many things can cause a sore throat. Take over-the-counter medicines only as told by your doctor. Get plenty of rest. Drink enough fluid to keep your pee (urine) pale yellow. Contact a doctor if your symptoms get worse or your sore throat does not get better within 7 days. This information is not intended to replace advice given to you by your health care provider. Make sure you discuss any questions you have with your health care provider. Document Revised: 12/04/2020 Document Reviewed: 12/04/2020 Elsevier Patient Education  2023 Elsevier Inc.  

## 2022-06-19 LAB — POCT RAPID STREP A (OFFICE): Rapid Strep A Screen: NEGATIVE

## 2022-07-15 ENCOUNTER — Other Ambulatory Visit (INDEPENDENT_AMBULATORY_CARE_PROVIDER_SITE_OTHER): Payer: Medicare PPO

## 2022-07-15 ENCOUNTER — Ambulatory Visit: Payer: Medicare PPO | Admitting: Gastroenterology

## 2022-07-15 ENCOUNTER — Encounter: Payer: Self-pay | Admitting: Gastroenterology

## 2022-07-15 VITALS — BP 110/60 | HR 73 | Ht 70.0 in | Wt 231.0 lb

## 2022-07-15 DIAGNOSIS — J029 Acute pharyngitis, unspecified: Secondary | ICD-10-CM

## 2022-07-15 DIAGNOSIS — K7469 Other cirrhosis of liver: Secondary | ICD-10-CM

## 2022-07-15 DIAGNOSIS — R1319 Other dysphagia: Secondary | ICD-10-CM

## 2022-07-15 LAB — CBC WITH DIFFERENTIAL/PLATELET
Basophils Absolute: 0.1 10*3/uL (ref 0.0–0.1)
Basophils Relative: 1.3 % (ref 0.0–3.0)
Eosinophils Absolute: 0.1 10*3/uL (ref 0.0–0.7)
Eosinophils Relative: 2.6 % (ref 0.0–5.0)
HCT: 40 % (ref 39.0–52.0)
Hemoglobin: 13.4 g/dL (ref 13.0–17.0)
Lymphocytes Relative: 40.6 % (ref 12.0–46.0)
Lymphs Abs: 2.1 10*3/uL (ref 0.7–4.0)
MCHC: 33.6 g/dL (ref 30.0–36.0)
MCV: 89.8 fl (ref 78.0–100.0)
Monocytes Absolute: 0.3 10*3/uL (ref 0.1–1.0)
Monocytes Relative: 6.5 % (ref 3.0–12.0)
Neutro Abs: 2.6 10*3/uL (ref 1.4–7.7)
Neutrophils Relative %: 49 % (ref 43.0–77.0)
Platelets: 166 10*3/uL (ref 150.0–400.0)
RBC: 4.45 Mil/uL (ref 4.22–5.81)
RDW: 13.5 % (ref 11.5–15.5)
WBC: 5.3 10*3/uL (ref 4.0–10.5)

## 2022-07-15 LAB — COMPREHENSIVE METABOLIC PANEL
ALT: 32 U/L (ref 0–53)
AST: 38 U/L — ABNORMAL HIGH (ref 0–37)
Albumin: 4.7 g/dL (ref 3.5–5.2)
Alkaline Phosphatase: 59 U/L (ref 39–117)
BUN: 31 mg/dL — ABNORMAL HIGH (ref 6–23)
CO2: 29 mEq/L (ref 19–32)
Calcium: 9.8 mg/dL (ref 8.4–10.5)
Chloride: 102 mEq/L (ref 96–112)
Creatinine, Ser: 1.7 mg/dL — ABNORMAL HIGH (ref 0.40–1.50)
GFR: 44.82 mL/min — ABNORMAL LOW (ref >60.00)
Glucose, Bld: 187 mg/dL — ABNORMAL HIGH (ref 70–99)
Potassium: 4.3 mEq/L (ref 3.5–5.1)
Sodium: 138 mEq/L (ref 135–145)
Total Bilirubin: 0.6 mg/dL (ref 0.2–1.2)
Total Protein: 7.7 g/dL (ref 6.0–8.3)

## 2022-07-15 LAB — PROTIME-INR
INR: 1.1 ratio — ABNORMAL HIGH (ref 0.8–1.0)
Prothrombin Time: 11.7 s (ref 9.6–13.1)

## 2022-07-15 NOTE — Progress Notes (Signed)
07/15/2022 Stephen Chase [id number] 1966-01-23   HISTORY OF PRESENT ILLNESS: This is a 56 year old male who is a patient of Dr. Doyne Keel.  He is deaf and speaks with sign Ecologist.  He has a history of cirrhosis which is thought to be related to fatty liver disease.  He has a history of small esophageal varices and is taking Coreg for this and hypertension.  He has not had any other signs of decompensation that we are aware of.  He has had a negative serologic work-up previously.  He endorses drinking alcohol heavily much earlier in life in his 55s but has not been using any alcohol in recent years.  He has not been seen here since August/September 2020.  He is really here today for complaints of dysphagia.  He had this complaint previously and last EGD was in September 2020.  Esophagus appeared normal at that time as below, but was empirically dilated.  He says that this feels different.  This is higher more in his throat.  He is having issues with swallowing liquids and thick things such as peanut butter.  He says that his throat feels dry and his throat or glands feel sore.  He says that everything lower in his chest feels fine, he does not feel like he has heartburn or reflux.  He is still on omeprazole low-dose once daily.   Prior workup: Barium swallow 07/30/2016 - normal exam EGD 02/03/2017 - small esophageal varices, normal esophagus otherwise without stenosis, gastric polyps removed (hyperplastic) and gastritis (HP negative) Colonoscopy 02/03/2017 - diverticulosis, 2 small adenomas, hemorrhoids   CT scan 03/10/17 - diffuse hepatic steatosis, cirrhotic changes, no mass lesions, splenomegaly, mild ascites, stable reactive benign porta hepatis adenopathy   US abdomen 06/21/18 - fatty liver / cirrhosis, no mass lesion, no ascites  EGD 05/2019: - Esophagogastric landmarks identified. - Small (< 5 mm) esophageal varices. - Normal esophagus otherwise - empiric dilation performed  to 51m. - Multiple benign appearing gastric polyps. Biopsied. - Small AVM in the proximal stomach. - The examination was otherwise normal. - Normal duodenal bulb and second portion of the duodenum.  Surgical [P], gastric antrum polyp (2) - MILD CHRONIC GASTRITIS WITH REACTIVE AND HYPERPLASTIC CHANGES - NO H. PYLORI OR INTESTINAL METAPLASIA IDENTIFIED  Ultrasound 05/2019: 1. Increased hepatic echogenicity compatible with hepatic steatosis. No morphologic changes of cirrhosis identified on this examination. No suspicious focal hepatic abnormalities/masses. 2. Normal hepatopetal portal vein flow. 3. Unremarkable gallbladder.  No biliary dilatation.  Past Medical History:  Diagnosis Date   Allergy    Arthritis    SHOULDERS   Balanitis    Deafness, sensorineural, childhood onset    BILATERAL SINCE AGE 51  SECONDARY TO UNKNOWN ILLNESS   GERD (gastroesophageal reflux disease)    Hyperlipidemia    Hypertension    No blood products    Patient is Jehovah Witness no blood products.   OSA on CPAP    SEVERE PER STUDY 03-09-2009   Sleep apnea    uses a cpap   Smokers' cough ([place]    Type 2 diabetes mellitus ([place]    Past Surgical History:  Procedure Laterality Date   CIRCUMCISION N/A 12/19/2013   Procedure: CIRCUMCISION ADULT;  Surgeon: MLowella Bandy MD;  [facility]  Service: Urology;  Laterality: N/A;   SHOULDER SURGERY Right    SHOULDER SURGERY Left 10/15/2021   WRIST SURGERY Left     reports that he has quit smoking. His  smoking use included cigarettes. He has a 30.00 pack-year smoking history. He has never used smokeless tobacco. He reports that he does not currently use alcohol. He reports that he does not use drugs. family history includes Cancer in his mother and another family member; Healthy in his brother; Liver disease in his father. No Known Allergies    Outpatient Encounter Medications as of 07/15/2022  Medication Sig   Accu-Chek Softclix  Lancets lancets Use as instructed to test blood sugars 3 times a day dx code e11.65   aspirin EC 81 MG tablet Take 81 mg by mouth daily.   atorvastatin (LIPITOR) 80 MG tablet TAKE 1 TABLET(40 MG) BY MOUTH AT BEDTIME   Blood Glucose Monitoring Suppl (ACCU-CHEK GUIDE) w/Device KIT Use to check blood sugars three times daily E11.9   carvedilol (COREG) 6.25 MG tablet TAKE 1 TABLET(6.25 MG) BY MOUTH TWICE DAILY WITH A MEAL   cholecalciferol (VITAMIN D3) 25 MCG (1000 UNIT) tablet Take 1,000 Units by mouth daily.   Cyanocobalamin (B-12 PO) Take by mouth daily.   cyclobenzaprine (FLEXERIL) 10 MG tablet Take 1 tablet (10 mg total) by mouth 3 (three) times daily as needed for muscle spasms.   empagliflozin (JARDIANCE) 10 MG TABS tablet Take 1 tablet (10 mg total) by mouth daily before breakfast.   fenofibrate (TRICOR) 145 MG tablet TAKE 1 TABLET(145 MG) BY MOUTH DAILY   glucose blood (ACCU-CHEK GUIDE) test strip CHECK BLOOD SUGAR 3 TIMES A DAY BEFORE BREAKFAST, LUNCH, AND DINNER   glucose blood test strip Use as instructed to test blood sugars 3 times a day dx code e11.65   Icosapent Ethyl (VASCEPA) 1 g CAPS One bid for elevated triglycerides   Insulin Pen Needle (PEN NEEDLES) 32G X 4 MM MISC Inject 1 each into the skin daily.   lidocaine (XYLOCAINE) 2 % solution Use as directed 15 mLs in the mouth or throat as needed for mouth pain.   Magnesium 250 MG TABS Take 1 tablet (250 mg total) by mouth every evening. With evening meals   modafinil (PROVIGIL) 200 MG tablet Take 1 tablet (200 mg total) by mouth daily.   Multiple Vitamins-Minerals (MULTIVITAMIN WITH MINERALS) tablet Take 1 tablet by mouth daily.   naproxen (NAPROSYN) 375 MG tablet Take 1 tablet (375 mg total) by mouth 2 (two) times daily with a meal.   olmesartan-hydrochlorothiazide (BENICAR HCT) 40-25 MG tablet Take 1 tablet by mouth daily.   Omega-3 Fatty Acids (FISH OIL) 1000 MG CPDR Take by mouth. Take one tablet daily   omeprazole (PRILOSEC) 10  MG capsule TAKE 1 CAPSULE(10 MG) BY MOUTH DAILY   OVER THE COUNTER MEDICATION Vitamin D 3 One capsule daily.   Semaglutide, 2 MG/DOSE, (OZEMPIC, 2 MG/DOSE,) 8 MG/3ML SOPN Inject 2 mg into the skin once a week.   tamsulosin (FLOMAX) 0.4 MG CAPS capsule TAKE 1 CAPSULE BY MOUTH EVERY DAY 30 MINUTES AFTER THE SAME MEAL   tiZANidine (ZANAFLEX) 4 MG tablet Take 1 tablet (4 mg total) by mouth at bedtime.   UNABLE TO FIND CPAP for OSA (6cm-19cm)   No facility-administered encounter medications on file as of 07/15/2022.     REVIEW OF SYSTEMS  : All other systems reviewed and negative except where noted in the History of Present Illness.   PHYSICAL EXAM: BP 110/60   Pulse 73   Ht _0  (1.778 m)   Wt 231 lb (104.8 kg)   BMI 33.15 kg/m  General: Well developed male in no acute distress  Head: Normocephalic and atraumatic Eyes:  Sclerae anicteric, conjunctiva pink. Ears: Normal auditory acuity Mouth:  No thrush noted but I could not see his throat well. Lungs: Clear throughout to auscultation; no W/R/R Heart: Regular rate and rhythm; no M/R/G Abdomen: Soft, non-distended.  BS present.  Non-tender. Musculoskeletal: Symmetrical with no gross deformities  Skin: No lesions on visible extremities Extremities: No edema  Neurological: Alert oriented x 4, grossly non-focal Psychological:  Alert and cooperative. Normal mood and affect  ASSESSMENT AND PLAN: 56 year old male here for reassessment of the following:   Cirrhosis / Esophageal varices - patient had small esophageal varices, on Coreg, he has not had any other evidence of decompensation.  He has been doing a good job at weight loss, over the past year or so, he should continue to work on this to try to normalize his body mass index.  He is due for CBC, INR, AFP, and screening right upper quadrant ultrasound.  He will continue to avoid alcohol.   Dysphagia and sore throat: He says that this feels different than what he was experiencing  before his esophageal dilation in 2020.  He feels this higher in his throat and is having issues with swallowing sometimes liquids and thick foods such as peanut butter.  He says his throat feels dry and feels like his throat or his glands are sore.  We discussed repeating endoscopy, but he says that everything lower in his chest feels fine and does not feel like it is acid reflux related.  Will refer to ENT.   CC:  [name], FNP

## 2022-07-15 NOTE — Patient Instructions (Signed)
Your provider has requested that you go to the basement level for lab work before leaving today. Press "B" on the elevator. The lab is located at the first door on the left as you exit the elevator.  Due to recent changes in healthcare laws, you may see the results of your imaging and laboratory studies on MyChart before your provider has had a chance to review them.  We understand that in some cases there may be results that are confusing or concerning to you. Not all laboratory results come back in the same time frame and the provider may be waiting for multiple results in order to interpret others.  Please give [place] 48 hours in order for your provider to thoroughly review all the results before contacting the office for clarification of your results.   We will refer you to [facility] located at [address] [place], phone # [phone]. They will contact you about an appointment.   You will be contacted by [facility] in the next 2 days to arrange a Ultasound.  The number on your caller ID will be [phone], please answer when they call.  If you have not heard from them in 2 days please call [phone] to schedule.     You have been scheduled for an abdominal ultrasound at [facility] (1st floor of hospital) on ________________ at ______________. Please arrive 30 minutes prior to your appointment for registration. Make certain not to have anything to eat or drink 6 hours prior to your appointment. Should you need to reschedule your appointment, please contact radiology at [phone]. This test typically takes about 30 minutes to perform.     I appreciate the opportunity to care for you. Alonza Bogus, PA-C

## 2022-07-16 LAB — AFP TUMOR MARKER: AFP-Tumor Marker: 3.3 ng/mL (ref <6.1)

## 2022-07-16 NOTE — Progress Notes (Signed)
Agree with assessment and plan as outlined. Symptoms sound more so oropharyngeal. Can see what ENT says, we could also consider modified barium swallow as well.

## 2022-07-22 ENCOUNTER — Ambulatory Visit (HOSPITAL_COMMUNITY)
Admission: RE | Admit: 2022-07-22 | Discharge: 2022-07-22 | Disposition: A | Discharge status: Home / Self Care | Payer: Medicare PPO | Source: Ambulatory Visit | Attending: Gastroenterology | Admitting: Gastroenterology

## 2022-07-22 DIAGNOSIS — K7469 Other cirrhosis of liver: Secondary | ICD-10-CM

## 2022-07-22 DIAGNOSIS — K7460 Unspecified cirrhosis of liver: Secondary | ICD-10-CM | POA: Diagnosis not present

## 2022-07-22 DIAGNOSIS — R1319 Other dysphagia: Secondary | ICD-10-CM

## 2022-07-22 DIAGNOSIS — J029 Acute pharyngitis, unspecified: Secondary | ICD-10-CM | POA: Diagnosis not present

## 2022-08-20 ENCOUNTER — Encounter: Payer: Medicare PPO | Admitting: Nurse Practitioner

## 2022-08-23 ENCOUNTER — Ambulatory Visit (HOSPITAL_COMMUNITY)
Admission: EM | Admit: 2022-08-23 | Discharge: 2022-08-23 | Disposition: A | Discharge status: Home / Self Care | Payer: Medicare PPO | Attending: Emergency Medicine | Admitting: Emergency Medicine

## 2022-08-23 ENCOUNTER — Encounter (HOSPITAL_COMMUNITY): Payer: Self-pay | Admitting: *Deleted

## 2022-08-23 ENCOUNTER — Other Ambulatory Visit: Payer: Self-pay

## 2022-08-23 DIAGNOSIS — U071 COVID-19: Secondary | ICD-10-CM | POA: Insufficient documentation

## 2022-08-23 DIAGNOSIS — B349 Viral infection, unspecified: Secondary | ICD-10-CM

## 2022-08-23 LAB — RESP PANEL BY RT-PCR (FLU A&B, COVID) ARPGX2
Influenza A by PCR: NEGATIVE
Influenza B by PCR: NEGATIVE
SARS Coronavirus 2 by RT PCR: POSITIVE — AB

## 2022-08-23 MED ORDER — BENZONATATE 100 MG PO CAPS: 100.0000 mg | ORAL_CAPSULE | Freq: Three times a day (TID) | ORAL | 0 refills | Status: DC

## 2022-08-23 NOTE — ED Triage Notes (Signed)
Pt wants a COVID Test

## 2022-08-23 NOTE — ED Triage Notes (Signed)
Pt reports HA ,dry cough and general body aches.

## 2022-08-23 NOTE — Discharge Instructions (Addendum)
We will call you if your test results warrant a change in your plan of care.   Tessalon has been sent to the pharmacy for cough, you can use this medication every 8 hours as needed.   Viral illnesses usually takes 7 to 10 days to resolve.  Using over-the-counter medications can help treat the symptoms.  You may rotate Tylenol and ibuprofen every 4-6 hours.  For example, take Tylenol now and then 4 to 6 hours later take ibuprofen.  You can use Tylenol for fever and moderate pain, you can take this medication every 4-6 hours, please do not take more than 3000 mg in a 24-hour day.  You can take ibuprofen every 6 hours, do not take more than 2400 mg in a 24-hour day.  I advised that you do not take ibuprofen on an empty stomach, ibuprofen can cause GI problems such as GI bleeding.  Chloraseptic throat spray and lozenges can be used for sore throat.  You may use warm liquids and honey for symptom management.  Maintaining hydration status is very important, please drink at least 8 cups of water daily.  Please try to intake nutrient dense meals.   Please go to the nearest emergency department if you begin having worsening shortness of breath, chest pain, palpitations, worsening lightheadedness, or any new/concerning symptoms.

## 2022-08-23 NOTE — ED Provider Notes (Signed)
[place]    CSN: [id number] Arrival date & time: 08/23/22  1024      History   Chief Complaint Chief Complaint  Patient presents with   Cough   Generalized Body Aches   Headache    HPI Stephen Chase is a 56 y.o. male.  Patient presents complaining of headache, productive cough, generalized body aches, and chills that started yesterday.  Patient reports a productive cough with green sputum.  Patient reports a sore throat.  Patient denies any recent exposure to any viral illness.  Patient has taken NyQuil, BC powder, and a homeopathic influenza medication with some relief of symptoms.  Patient has a history of diabetes and reports that he checks his blood sugars at home.  Sign language interpretor used.  Patient's significant other is at bedside.   Cough Associated symptoms: chills, fever, headaches, myalgias, rhinorrhea and sore throat   Associated symptoms: no chest pain, no ear pain, no shortness of breath and no wheezing   Headache Associated symptoms: cough, fatigue, fever, myalgias and sore throat   Associated symptoms: no abdominal pain, no congestion, no ear pain, no nausea, no sinus pressure and no vomiting     Past Medical History:  Diagnosis Date   Allergy    Arthritis    SHOULDERS   Balanitis    Deafness, sensorineural, childhood onset    BILATERAL SINCE AGE 25  SECONDARY TO UNKNOWN ILLNESS   GERD (gastroesophageal reflux disease)    Hyperlipidemia    Hypertension    No blood products    Patient is Jehovah Witness no blood products.   OSA on CPAP    SEVERE PER STUDY 03-09-2009   Sleep apnea    uses a cpap   Smokers' cough ([place])    Type 2 diabetes mellitus ([place])     Patient Active Problem List   Diagnosis Date Noted   Other dysphagia 07/15/2022   Other cirrhosis of liver ([place]) 07/15/2022   Sore throat 07/15/2022   Hypersomnia, persistent 05/09/2018   Type 2 diabetes mellitus with diabetic neuropathy, without long-term current use of insulin  ([place]) 05/09/2018   Uncontrolled type 2 diabetes mellitus with hyperglycemia ([place]) 05/09/2018   OSA on CPAP 05/09/2018   Esophageal varices without bleeding ([place]) 05/09/2018   Bilateral deafness 05/09/2018    Past Surgical History:  Procedure Laterality Date   CIRCUMCISION N/A 12/19/2013   Procedure: CIRCUMCISION ADULT;  Surgeon: Lowella Bandy, MD;  Location: [facility];  Service: Urology;  Laterality: N/A;   SHOULDER SURGERY Right    SHOULDER SURGERY Left 10/15/2021   WRIST SURGERY Left        Home Medications    Prior to Admission medications   Medication Sig Start Date End Date Taking? Authorizing Provider  benzonatate (TESSALON) 100 MG capsule Take 1 capsule (100 mg total) by mouth every 8 (eight) hours. 08/23/22  Yes Flossie Dibble, NP  Accu-Chek Softclix Lancets lancets Use as instructed to test blood sugars 3 times a day dx code e11.65 09/01/21   [name], FNP  aspirin EC 81 MG tablet Take 81 mg by mouth daily.    [provider]  atorvastatin (LIPITOR) 80 MG tablet TAKE 1 TABLET(40 MG) BY MOUTH AT BEDTIME 04/16/22   [name], FNP  Blood Glucose Monitoring Suppl (ACCU-CHEK GUIDE) w/Device KIT Use to check blood sugars three times daily E11.9 02/06/20   [name], FNP  carvedilol (COREG) 6.25 MG tablet TAKE 1 TABLET(6.25 MG) BY MOUTH TWICE  DAILY WITH A MEAL 01/19/22   [name], FNP  cholecalciferol (VITAMIN D3) 25 MCG (1000 UNIT) tablet Take 1,000 Units by mouth daily.    [provider]  Cyanocobalamin (B-12 PO) Take by mouth daily.    [provider]  cyclobenzaprine (FLEXERIL) 10 MG tablet Take 1 tablet (10 mg total) by mouth 3 (three) times daily as needed for muscle spasms. 10/02/20   [name], FNP  empagliflozin (JARDIANCE) 10 MG TABS tablet Take 1 tablet (10 mg total) by mouth daily before breakfast. 04/15/22   [name], FNP  fenofibrate (TRICOR) 145 MG tablet TAKE 1 TABLET(145 MG) BY MOUTH DAILY 04/15/22    [name], FNP  glucose blood (ACCU-CHEK GUIDE) test strip CHECK BLOOD SUGAR 3 TIMES A DAY BEFORE BREAKFAST, LUNCH, AND DINNER 10/11/20   [name], FNP  glucose blood test strip Use as instructed to test blood sugars 3 times a day dx code e11.65 06/04/22   [name], FNP  Icosapent Ethyl (VASCEPA) 1 g CAPS One bid for elevated triglycerides 05/22/19   Rodriguez-Southworth, Sunday Spillers, PA-C  Insulin Pen Needle (PEN NEEDLES) 32G X 4 MM MISC Inject 1 each into the skin daily. 12/23/18   Rodriguez-Southworth, Sunday Spillers, PA-C  lidocaine (XYLOCAINE) 2 % solution Use as directed 15 mLs in the mouth or throat as needed for mouth pain. 06/17/22   [name], FNP  Magnesium 250 MG TABS Take 1 tablet (250 mg total) by mouth every evening. With evening meals 04/10/20   [name], FNP  modafinil (PROVIGIL) 200 MG tablet Take 1 tablet (200 mg total) by mouth daily. 05/07/22   Ward Givens, NP  Multiple Vitamins-Minerals (MULTIVITAMIN WITH MINERALS) tablet Take 1 tablet by mouth daily.    [provider]  naproxen (NAPROSYN) 375 MG tablet Take 1 tablet (375 mg total) by mouth 2 (two) times daily with a meal. 08/07/21   Jaynee Eagles, PA-C  olmesartan-hydrochlorothiazide (BENICAR HCT) 40-25 MG tablet Take 1 tablet by mouth daily. 04/06/22   [name], FNP  Omega-3 Fatty Acids (FISH OIL) 1000 MG CPDR Take by mouth. Take one tablet daily    [provider]  omeprazole (PRILOSEC) 10 MG capsule TAKE 1 CAPSULE(10 MG) BY MOUTH DAILY 01/19/22   [name], FNP  OVER THE COUNTER MEDICATION Vitamin D 3 One capsule daily.    [provider]  Semaglutide, 2 MG/DOSE, (OZEMPIC, 2 MG/DOSE,) 8 MG/3ML SOPN Inject 2 mg into the skin once a week. 11/19/21   [name], FNP  tamsulosin (FLOMAX) 0.4 MG CAPS capsule TAKE 1 CAPSULE BY MOUTH EVERY DAY 30 MINUTES AFTER THE SAME MEAL 04/06/22   [name], FNP  tiZANidine (ZANAFLEX) 4 MG tablet Take 1 tablet (4 mg total) by mouth at bedtime. 08/07/21    Jaynee Eagles, PA-C  UNABLE TO FIND CPAP for OSA (6cm-19cm)    [provider]    Family History Family History  Problem Relation Age of Onset   Cancer Mother        type unknown pt was 89 years old at that time   Liver disease Father    Healthy Brother    Cancer Other         alot of family members have cancer but doesnt know the type   Colon cancer Neg Hx    Esophageal cancer Neg Hx    Rectal cancer Neg Hx    Stomach cancer Neg Hx    Sleep apnea Neg Hx     Social  History Social History   Tobacco Use   Smoking status: Former    Packs/day: 1.00    Years: 30.00    Total pack years: 30.00    Types: Cigarettes   Smokeless tobacco: Never   Tobacco comments:    quit 4 years ago  Vaping Use   Vaping Use: Never used  Substance Use Topics   Alcohol use: Not Currently    Comment: rarely   Drug use: No     Allergies   Patient has no known allergies.   Review of Systems Review of Systems  Constitutional:  Positive for activity change, chills, fatigue and fever. Negative for appetite change.  HENT:  Positive for rhinorrhea and sore throat. Negative for congestion, ear discharge, ear pain, sinus pressure, sinus pain, trouble swallowing and voice change.   Eyes: Negative.   Respiratory:  Positive for cough. Negative for chest tightness, shortness of breath and wheezing.   Cardiovascular:  Negative for chest pain and palpitations.  Gastrointestinal:  Negative for abdominal pain, nausea and vomiting.  Musculoskeletal:  Positive for myalgias.  Neurological:  Positive for headaches.     Physical Exam Triage Vital Signs ED Triage Vitals  Enc Vitals Group     BP 08/23/22 1053 (!) 103/51     Pulse Rate 08/23/22 1053 85     Resp 08/23/22 1053 20     Temp 08/23/22 1053 99.8 F (37.7 C)     Temp src --      SpO2 08/23/22 1053 97 %     Weight --      Height --      Head Circumference --      Peak Flow --      Pain Score 08/23/22 1051 5     Pain Loc --       Pain Edu? --      Excl. in [place]? --    No data found.  Updated Vital Signs BP (!) 103/51   Pulse 85   Temp 99.8 F (37.7 C)   Resp 20   SpO2 97%     Physical Exam Vitals and nursing note reviewed.  Constitutional:      Appearance: He is well-developed.  HENT:     Right Ear: Hearing, tympanic membrane, ear canal and external ear normal.     Left Ear: Hearing, tympanic membrane, ear canal and external ear normal.     Nose: Rhinorrhea present. No congestion. Rhinorrhea is clear.     Right Turbinates: Not enlarged, swollen or pale.     Left Turbinates: Not enlarged, swollen or pale.     Mouth/Throat:     Mouth: Mucous membranes are moist.     Dentition: Normal dentition.     Pharynx: Posterior oropharyngeal erythema present. No pharyngeal swelling, oropharyngeal exudate or uvula swelling.     Tonsils: No tonsillar exudate or tonsillar abscesses. 0 on the right. 0 on the left.  Cardiovascular:     Rate and Rhythm: Normal rate and regular rhythm.     Heart sounds: Normal heart sounds, S1 normal and S2 normal.  Pulmonary:     Effort: Pulmonary effort is normal.     Breath sounds: Normal breath sounds and air entry. No decreased breath sounds, wheezing, rhonchi or rales.  Lymphadenopathy:     Cervical: Cervical adenopathy present.     Right cervical: Deep cervical adenopathy present. No superficial or posterior cervical adenopathy.    Left cervical: No superficial, deep or posterior cervical adenopathy.  Neurological:     Mental Status: He is alert.  Psychiatric:        Behavior: Behavior is cooperative.      UC Treatments / Results  Labs (all labs ordered are listed, but only abnormal results are displayed) Labs Reviewed  RESP PANEL BY RT-PCR (FLU A&B, COVID) [id number]    EKG   Radiology No results found.  Procedures Procedures (including critical care time)  Medications Ordered in UC Medications - No data to display  Initial Impression / Assessment and Plan / UC  Course  I have reviewed the triage vital signs and the nursing notes.  Pertinent labs & imaging results that were available during my care of the patient were reviewed by me and considered in my medical decision making (see chart for details).     Viral illness. Tessalon sent to pharmacy for cough. COVID and Flu test is pending. Possible candidate for antiviral medication for COVID (molnupiravir) or Flu (TamiFlu) .  Patient was made aware of symptom management of a viral illness and timeline for resolution of symptoms.  Patient was made aware of red flag symptoms that warrant an emergency department visit. Patient made aware of timeline for symptom resolution and when follow-up would be necessary.  Patient made aware of results reporting protocol and MyChart.  Patient verbalized understanding of instructions.    Charting was provided using a a verbal dictation system, charting was proofread for errors, errors may occur which could change the meaning of the information charted.   Final Clinical Impressions(s) / UC Diagnoses   Final diagnoses:  Viral illness     Discharge Instructions      We will call you if your test results warrant a change in your plan of care.   Tessalon has been sent to the pharmacy for cough, you can use this medication every 8 hours as needed.   Viral illnesses usually takes 7 to 10 days to resolve.  Using over-the-counter medications can help treat the symptoms.  You may rotate Tylenol and ibuprofen every 4-6 hours.  For example, take Tylenol now and then 4 to 6 hours later take ibuprofen.  You can use Tylenol for fever and moderate pain, you can take this medication every 4-6 hours, please do not take more than 3000 mg in a 24-hour day.  You can take ibuprofen every 6 hours, do not take more than 2400 mg in a 24-hour day.  I advised that you do not take ibuprofen on an empty stomach, ibuprofen can cause GI problems such as GI bleeding.  Chloraseptic throat spray and  lozenges can be used for sore throat.  You may use warm liquids and honey for symptom management.  Maintaining hydration status is very important, please drink at least 8 cups of water daily.  Please try to intake nutrient dense meals.   Please go to the nearest emergency department if you begin having worsening shortness of breath, chest pain, palpitations, worsening lightheadedness, or any new/concerning symptoms.    ED Prescriptions     Medication Sig Dispense Auth. Provider   benzonatate (TESSALON) 100 MG capsule Take 1 capsule (100 mg total) by mouth every 8 (eight) hours. 24 capsule Flossie Dibble, NP      PDMP not reviewed this encounter.   Flossie Dibble, NP 08/23/22 1327

## 2022-08-24 ENCOUNTER — Telehealth (HOSPITAL_COMMUNITY): Payer: Self-pay | Admitting: Emergency Medicine

## 2022-08-24 MED ORDER — MOLNUPIRAVIR EUA 200MG CAPSULE: 4.0000 | ORAL_CAPSULE | Freq: Two times a day (BID) | ORAL | 0 refills | Status: AC

## 2022-09-23 ENCOUNTER — Other Ambulatory Visit: Payer: Self-pay | Admitting: Nurse Practitioner

## 2022-09-30 ENCOUNTER — Encounter: Payer: Self-pay | Admitting: Nurse Practitioner

## 2022-10-06 ENCOUNTER — Telehealth: Payer: Self-pay | Admitting: Adult Health

## 2022-10-06 ENCOUNTER — Other Ambulatory Visit (HOSPITAL_COMMUNITY): Payer: Self-pay

## 2022-10-06 NOTE — Telephone Encounter (Signed)
Wonderful. I called pt's wife back and let her know. She was appreciative. Also called [facility]. They were able to refill. Pt will be due for more refills as current rx expires next month. Pt has pending appt on 10/12/22.

## 2022-10-06 NOTE — Telephone Encounter (Signed)
Pharmacy Patient Advocate Encounter  Prior Authorization for Modafinil '200MG'$  tablets has been approved.    PA# PA Case ID: [id number] Effective dates: 10/06/2022 through 09/21/2023

## 2022-10-06 NOTE — Telephone Encounter (Signed)
I am faxing the Modafinil 200 mg PA to [phone] now.

## 2022-10-06 NOTE — Telephone Encounter (Signed)
Called pharmacy and has refill available but requiring a PA.  Insurance has not changed.

## 2022-10-06 NOTE — Telephone Encounter (Signed)
Pharmacy Patient Advocate Encounter   Received notification from [facility] that prior authorization for Modafinil '200MG'$  tablets is required/requested.    PA submitted on 10/06/2022 to (ins) [employer] via CoverMyMeds Key [id number] Status is pending

## 2022-10-06 NOTE — Telephone Encounter (Signed)
Pt is requesting a refill for modafinil (PROVIGIL) 200 MG tablet .  Pharmacy: [facility] [phone]  Wife reports pt will be out of the medication as of 01-19, she does not want him to go 3 days without medication (until appointment on 01-22) Wife confirms there has not been any changes to insurance with New Year

## 2022-10-08 ENCOUNTER — Encounter: Payer: Self-pay | Admitting: *Deleted

## 2022-10-12 ENCOUNTER — Ambulatory Visit: Payer: Medicare PPO | Admitting: Adult Health

## 2022-10-15 ENCOUNTER — Other Ambulatory Visit: Payer: Self-pay

## 2022-10-15 MED ORDER — OLMESARTAN MEDOXOMIL-HCTZ 40-25 MG PO TABS: 1.0000 | ORAL_TABLET | Freq: Every day | ORAL | 0 refills | Status: DC

## 2022-10-26 ENCOUNTER — Ambulatory Visit (INDEPENDENT_AMBULATORY_CARE_PROVIDER_SITE_OTHER): Payer: Medicare PPO | Admitting: Nurse Practitioner

## 2022-10-26 ENCOUNTER — Encounter: Payer: Self-pay | Admitting: Nurse Practitioner

## 2022-10-26 VITALS — BP 118/62 | HR 73 | Temp 98.2°F | Ht 70.0 in | Wt 232.0 lb

## 2022-10-26 DIAGNOSIS — E6609 Other obesity due to excess calories: Secondary | ICD-10-CM

## 2022-10-26 DIAGNOSIS — Z87891 Personal history of nicotine dependence: Secondary | ICD-10-CM

## 2022-10-26 DIAGNOSIS — I10 Essential (primary) hypertension: Secondary | ICD-10-CM | POA: Diagnosis not present

## 2022-10-26 DIAGNOSIS — E1140 Type 2 diabetes mellitus with diabetic neuropathy, unspecified: Secondary | ICD-10-CM

## 2022-10-26 DIAGNOSIS — Z23 Encounter for immunization: Secondary | ICD-10-CM

## 2022-10-26 DIAGNOSIS — Z6834 Body mass index (BMI) 34.0-34.9, adult: Secondary | ICD-10-CM

## 2022-10-26 DIAGNOSIS — Z79899 Other long term (current) drug therapy: Secondary | ICD-10-CM | POA: Diagnosis not present

## 2022-10-26 DIAGNOSIS — E782 Mixed hyperlipidemia: Secondary | ICD-10-CM | POA: Diagnosis not present

## 2022-10-26 NOTE — Progress Notes (Signed)
Subjective:     Patient ID: Stephen Chase , male    DOB: 12/11/1965 , 57 y.o.   MRN: [id number]   Chief Complaint  Patient presents with   Follow-up    HPI  He is here today for f/u of of diabetes. There is not an interpreter today. Using writing to communicate. Average blood sugar is 183 over 90 days.     Diabetes He presents for his follow-up diabetic visit. He has type 2 diabetes mellitus. His disease course has been stable. Pertinent negatives for hypoglycemia include no dizziness. Pertinent negatives for diabetes include no chest pain, no polydipsia, no polyphagia and no polyuria. There are no hypoglycemic complications. Symptoms are stable. There are no diabetic complications. Risk factors for coronary artery disease include obesity and sedentary lifestyle. Current diabetic treatment includes oral agent (dual therapy). His weight is stable. He is following a generally unhealthy diet. When asked about meal planning, he reported none. He has not had a previous visit with a dietitian. He participates in exercise intermittently (he is walking alot and working a strenuous job.). There is no change in his home blood glucose trend. (Reports his range of blood sugars 147-314, he is checking 2 hours before eating. ) An ACE inhibitor/angiotensin II receptor blocker is being taken. He does not see a podiatrist.Eye exam is current.  Hypertension This is a chronic problem. The current episode started more than 1 year ago. The problem has been gradually improving since onset. The problem is controlled. Pertinent negatives include no chest pain or palpitations. There are no associated agents to hypertension. Risk factors for coronary artery disease include obesity, sedentary lifestyle, dyslipidemia, diabetes mellitus and male gender. Past treatments include ACE inhibitors. There are no compliance problems.  There is no history of chronic renal disease.     Past Medical History:  Diagnosis Date    Allergy    Arthritis    SHOULDERS   Balanitis    Deafness, sensorineural, childhood onset    BILATERAL SINCE AGE 50  SECONDARY TO UNKNOWN ILLNESS   GERD (gastroesophageal reflux disease)    Hyperlipidemia    Hypertension    No blood products    Patient is Jehovah Witness no blood products.   OSA on CPAP    SEVERE PER STUDY 03-09-2009   Sleep apnea    uses a cpap   Smokers' cough ([place])    Type 2 diabetes mellitus ([place])      Family History  Problem Relation Age of Onset   Cancer Mother        type unknown pt was 47 years old at that time   Liver disease Father    Healthy Brother    Cancer Other         alot of family members have cancer but doesnt know the type   Colon cancer Neg Hx    Esophageal cancer Neg Hx    Rectal cancer Neg Hx    Stomach cancer Neg Hx    Sleep apnea Neg Hx      Current Outpatient Medications:    Accu-Chek Softclix Lancets lancets, Use as instructed to test blood sugars 3 times a day dx code e11.65, Disp: 200 each, Rfl: 3   aspirin EC 81 MG tablet, Take 81 mg by mouth daily., Disp: , Rfl:    atorvastatin (LIPITOR) 40 MG tablet, TAKE 1 TABLET(40 MG) BY MOUTH AT BEDTIME, Disp: 90 tablet, Rfl: 1   atorvastatin (LIPITOR) 80 MG tablet, TAKE  1 TABLET(40 MG) BY MOUTH AT BEDTIME, Disp: 90 tablet, Rfl: 1   benzonatate (TESSALON) 100 MG capsule, Take 1 capsule (100 mg total) by mouth every 8 (eight) hours., Disp: 24 capsule, Rfl: 0   Blood Glucose Monitoring Suppl (ACCU-CHEK GUIDE) w/Device KIT, Use to check blood sugars three times daily E11.9, Disp: 1 kit, Rfl: 0   carvedilol (COREG) 6.25 MG tablet, TAKE 1 TABLET(6.25 MG) BY MOUTH TWICE DAILY WITH A MEAL, Disp: 180 tablet, Rfl: 1   cholecalciferol (VITAMIN D3) 25 MCG (1000 UNIT) tablet, Take 1,000 Units by mouth daily., Disp: , Rfl:    Cyanocobalamin (B-12 PO), Take by mouth daily., Disp: , Rfl:    cyclobenzaprine (FLEXERIL) 10 MG tablet, Take 1 tablet (10 mg total) by mouth 3 (three) times daily as needed for  muscle spasms., Disp: 30 tablet, Rfl: 0   fenofibrate (TRICOR) 145 MG tablet, TAKE 1 TABLET(145 MG) BY MOUTH DAILY, Disp: 90 tablet, Rfl: 1   glucose blood (ACCU-CHEK GUIDE) test strip, CHECK BLOOD SUGAR 3 TIMES A DAY BEFORE BREAKFAST, LUNCH, AND DINNER, Disp: 150 strip, Rfl: 2   glucose blood test strip, Use as instructed to test blood sugars 3 times a day dx code e11.65, Disp: 100 each, Rfl: 12   Icosapent Ethyl (VASCEPA) 1 g CAPS, One bid for elevated triglycerides, Disp: 180 capsule, Rfl: 0   Insulin Pen Needle (PEN NEEDLES) 32G X 4 MM MISC, Inject 1 each into the skin daily., Disp: 30 each, Rfl: 2   lidocaine (XYLOCAINE) 2 % solution, Use as directed 15 mLs in the mouth or throat as needed for mouth pain., Disp: 100 mL, Rfl: 0   Magnesium 250 MG TABS, Take 1 tablet (250 mg total) by mouth every evening. With evening meals, Disp: 30 tablet, Rfl: 2   modafinil (PROVIGIL) 200 MG tablet, Take 1 tablet (200 mg total) by mouth daily., Disp: 30 tablet, Rfl: 5   Multiple Vitamins-Minerals (MULTIVITAMIN WITH MINERALS) tablet, Take 1 tablet by mouth daily., Disp: , Rfl:    naproxen (NAPROSYN) 375 MG tablet, Take 1 tablet (375 mg total) by mouth 2 (two) times daily with a meal., Disp: 30 tablet, Rfl: 0   olmesartan-hydrochlorothiazide (BENICAR HCT) 40-25 MG tablet, Take 1 tablet by mouth daily., Disp: 10 tablet, Rfl: 0   Omega-3 Fatty Acids (FISH OIL) 1000 MG CPDR, Take by mouth. Take one tablet daily, Disp: , Rfl:    Semaglutide, 2 MG/DOSE, (OZEMPIC, 2 MG/DOSE,) 8 MG/3ML SOPN, Inject 2 mg into the skin once a week., Disp: 3 mL, Rfl: 1   tamsulosin (FLOMAX) 0.4 MG CAPS capsule, TAKE 1 CAPSULE BY MOUTH EVERY DAY 30 MINUTES AFTER THE SAME MEAL, Disp: 90 capsule, Rfl: 1   tiZANidine (ZANAFLEX) 4 MG tablet, Take 1 tablet (4 mg total) by mouth at bedtime., Disp: 30 tablet, Rfl: 0   UNABLE TO FIND, CPAP for OSA (6cm-19cm), Disp: , Rfl:    JARDIANCE 10 MG TABS tablet, TAKE 1 TABLET(10 MG) BY MOUTH DAILY BEFORE  BREAKFAST, Disp: 90 tablet, Rfl: 1   omeprazole (PRILOSEC) 10 MG capsule, TAKE 1 CAPSULE(10 MG) BY MOUTH DAILY, Disp: 90 capsule, Rfl: 1   No Known Allergies   Review of Systems  Constitutional: Negative.   Respiratory: Negative.    Cardiovascular:  Negative for chest pain, palpitations and leg swelling.  Endocrine: Negative for polydipsia, polyphagia and polyuria.  Neurological:  Negative for dizziness.  Psychiatric/Behavioral: Negative.       Today's Vitals   10/26/22 0932  BP: 118/62  Pulse: 73  Temp: 98.2 F (36.8 C)  TempSrc: Oral  SpO2: 97%  Weight: 232 lb (105.2 kg)  Height: 5' 10"$  (1.778 m)   Body mass index is 33.29 kg/m.   Objective:  Physical Exam Vitals reviewed.  Constitutional:      General: He is not in acute distress.    Appearance: Normal appearance. He is obese.  HENT:     Head: Normocephalic and atraumatic.  Eyes:     Pupils: Pupils are equal, round, and reactive to light.  Cardiovascular:     Rate and Rhythm: Normal rate and regular rhythm.     Pulses: Normal pulses.     Heart sounds: Normal heart sounds. No murmur heard. Pulmonary:     Effort: Pulmonary effort is normal. No respiratory distress.     Breath sounds: Normal breath sounds. No wheezing.  Musculoskeletal:     Right shoulder: Normal.  Skin:    General: Skin is warm and dry.     Capillary Refill: Capillary refill takes less than 2 seconds.  Neurological:     General: No focal deficit present.     Mental Status: He is alert and oriented to person, place, and time.     Cranial Nerves: No cranial nerve deficit.     Motor: No weakness.  Psychiatric:        Mood and Affect: Mood normal.        Behavior: Behavior normal.        Thought Content: Thought content normal.        Judgment: Judgment normal.         Assessment And Plan:     1. Type 2 diabetes mellitus with diabetic neuropathy, without long-term current use of insulin ([place]) Comments: He will schedule an eye appt. -  Lipid panel - CMP14 + Anion Gap - Hemoglobin A1c  2. Mixed hyperlipidemia Comments: Poorly controlled, continue current medications. - Lipid panel - CMP14 + Anion Gap  3. Essential hypertension Comments: Blood pressure is well controlled. Continue current medications. - CMP14 + Anion Gap  4. Class 1 obesity due to excess calories with serious comorbidity and body mass index (BMI) of 34.0 to 34.9 in adult Comments: Encouraged to focus on weight loss and eating a healthy diet.  5. Encounter for immunization Comments: Shingrix #1 given in office. TransRx done - Zoster Recombinant (Shingrix )  6. History of smoking 25-50 pack years Comments: Will recheck low dose CT scan due to smoking history of 30 PPY. - CT CHEST LUNG CA SCREEN LOW DOSE W/O CM; Future  7. Other long term (current) drug therapy - TSH    Patient was given opportunity to ask questions. Patient verbalized understanding of the plan and was able to repeat key elements of the plan. All questions were answered to their satisfaction.  [name], FNP   I, [name], FNP, have reviewed all documentation for this visit. The documentation on 10/26/22 for the exam, diagnosis, procedures, and orders are all accurate and complete.   IF YOU HAVE BEEN REFERRED TO A SPECIALIST, IT MAY TAKE 1-2 WEEKS TO SCHEDULE/PROCESS THE REFERRAL. IF YOU HAVE NOT HEARD FROM US/SPECIALIST IN TWO WEEKS, PLEASE GIVE [place] A CALL AT [phone].   THE PATIENT IS ENCOURAGED TO PRACTICE SOCIAL DISTANCING DUE TO THE COVID-19 PANDEMIC.

## 2022-10-27 ENCOUNTER — Encounter: Payer: Self-pay | Admitting: Nurse Practitioner

## 2022-10-27 ENCOUNTER — Encounter: Payer: Self-pay | Admitting: Adult Health

## 2022-10-27 ENCOUNTER — Ambulatory Visit: Payer: Medicare PPO | Admitting: Adult Health

## 2022-10-27 ENCOUNTER — Other Ambulatory Visit: Payer: Self-pay | Admitting: Nurse Practitioner

## 2022-10-27 VITALS — BP 113/58 | HR 84 | Ht 69.0 in | Wt 234.0 lb

## 2022-10-27 DIAGNOSIS — G4733 Obstructive sleep apnea (adult) (pediatric): Secondary | ICD-10-CM

## 2022-10-27 DIAGNOSIS — G4710 Hypersomnia, unspecified: Secondary | ICD-10-CM

## 2022-10-27 DIAGNOSIS — E1140 Type 2 diabetes mellitus with diabetic neuropathy, unspecified: Secondary | ICD-10-CM

## 2022-10-27 LAB — CMP14 + ANION GAP
ALT: 30 IU/L (ref 0–44)
AST: 28 IU/L (ref 0–40)
Albumin/Globulin Ratio: 2 (ref 1.2–2.2)
Albumin: 4.8 g/dL (ref 3.8–4.9)
Alkaline Phosphatase: 70 IU/L (ref 44–121)
Anion Gap: 17 mmol/L (ref 10.0–18.0)
BUN/Creatinine Ratio: 17 (ref 9–20)
BUN: 24 mg/dL (ref 6–24)
Bilirubin Total: 0.4 mg/dL (ref 0.0–1.2)
CO2: 22 mmol/L (ref 20–29)
Calcium: 10 mg/dL (ref 8.7–10.2)
Chloride: 97 mmol/L (ref 96–106)
Creatinine, Ser: 1.38 mg/dL — ABNORMAL HIGH (ref 0.76–1.27)
Globulin, Total: 2.4 g/dL (ref 1.5–4.5)
Glucose: 172 mg/dL — ABNORMAL HIGH (ref 70–99)
Potassium: 4.4 mmol/L (ref 3.5–5.2)
Sodium: 136 mmol/L (ref 134–144)
Total Protein: 7.2 g/dL (ref 6.0–8.5)
eGFR: 60 mL/min/{1.73_m2} (ref >59)

## 2022-10-27 LAB — LIPID PANEL
Chol/HDL Ratio: 7 ratio — ABNORMAL HIGH (ref 0.0–5.0)
Cholesterol, Total: 174 mg/dL (ref 100–199)
HDL: 25 mg/dL — ABNORMAL LOW (ref >39)
LDL Chol Calc (NIH): 63 mg/dL (ref 0–99)
Triglycerides: 567 mg/dL (ref 0–149)
VLDL Cholesterol Cal: 86 mg/dL — ABNORMAL HIGH (ref 5–40)

## 2022-10-27 LAB — HEMOGLOBIN A1C
Est. average glucose Bld gHb Est-mCnc: 180 mg/dL
Hgb A1c MFr Bld: 7.9 % — ABNORMAL HIGH (ref 4.8–5.6)

## 2022-10-27 LAB — TSH: TSH: 2.11 u[IU]/mL (ref 0.450–4.500)

## 2022-10-27 NOTE — Progress Notes (Signed)
PATIENT: Stephen Chase DOB: [birth year]  REASON FOR VISIT: follow up HISTORY FROM: patient, sign language interpreter present  HISTORY OF PRESENT ILLNESS: Today 10/27/22:  Stephen Chase is a 57 y.o. male with a history of OSA on CPAP. Returns today for follow-up.  Reports that CPAP is working well for him.  Denies any new issues.  He states that he uses Provigil daily.  He states that it works well.  However he is noticing that sometimes he is having a hard time falling asleep at night.  His download is below     10/09/21: Stephen Chase is a 57 year old male with a history of obstructive sleep apnea on CPAP.  He returns today for follow-up.  He reports that CPAP works well for him.  He denies any new issues.  He returns today for follow-up.  10/09/20: Stephen Chase is a 57 year old male with a history of obstructive sleep apnea on CPAP.  Patient reports that he tolerates the CPAP well.  He does find it beneficial.  Denies any new issues.  His CPAP report is indicates:  Compliance Report Usage 09/09/2020 - 10/08/2020 Usage days 30/30 days (100%) >= 4 hours 27 days (90%) Average usage (days used) 5 hours 50 minutes  AirSense 10 AutoSet Serial number [id number] Mode AutoSet Min Pressure 6 cmH2O Max Pressure 19 cmH2O EPR Fulltime EPR level 3  Therapy Pressure - cmH2O Median: 10.0 95th percentile: 14.5 Maximum: 16.3 Leaks - L/min Median: 0.0 95th percentile: 2.1 Maximum: 32.8 Events per hour AI: 1.8 HI: 0.9 AHI: 2.7 Apnea Index Central: 0.2 Obstructive: 1.6 Unknown: 0.0   10/04/19: Stephen Chase is a 57 year old male with a history of obstructive sleep apnea on CPAP.  His download indicates that he uses machine 29 out of 30 days for compliance of 97%.  He uses machine greater than 4 hours 25 days for compliance of 83%.  On average he uses his machine 5 hours and 59 minutes.  His residual AHI is 2.7 on 6 to 19 cm of water with EPR of 3.  His leak in the 95th percentile is 6.6 L/min.  He states  that he continues to take modafinil.  He reports that this offers him good benefit.  He states that he was confused about Flomax and was taking it during the day and this was causing daytime sleepiness.  Now that he is taking it at bedtime his sleepiness has improved.  He returns today for an evaluation.  HISTORY (Copied from Dr.Dohmeier's note) Stephen Chase is a 57 y.o. male , seen here as in a referral  from [facility] for a re- evaluation of sleep apnea,in the presence of his sign Ecologist.  09-28-2018, follow up on CPAP titration and compliance.  Stephen Chase was evaluated by a home sleep study dated 08 June 2018 by watch pat device.  The total AHI was 16.6/h there was mild to moderate snoring noticed, oxygen nadir was 89%, average heart rate was 56 bpm.  The patient had already been on CPAP before and we continued CPAP treatment with an auto titration device.  I have the opportunity to see the excellent compliance the patient has used the machine 30 out of 30 days and 26 of those days over 4 hours.  Average use of time is 5 hours 58 minutes each night the AutoSet has a pressure window between 6 cmH2O and 19 cm water pressure with 3 cm expiratory pressure relief.  The 95th percentile pressure is 16 cmH2O  with an AHI of 6.2 this seems to be related to unknown events at 2.5/h and I am not quite sure where they come from.   He does not have central apneas and he has very minimal air leakage.  I noticed that only 3 out of 30 nights had a very high AHI and this may have been a artifactual count the general average AHI is truly under 2/h.    REVIEW OF SYSTEMS: Out of a complete 14 system review of symptoms, the patient complains only of the following symptoms, and all other reviewed systems are negative.  Epworth sleepiness score 10   ALLERGIES: No Known Allergies  HOME MEDICATIONS: Outpatient Medications Prior to Visit  Medication Sig Dispense Refill   Accu-Chek Softclix Lancets  lancets Use as instructed to test blood sugars 3 times a day dx code e11.65 200 each 3   aspirin EC 81 MG tablet Take 81 mg by mouth daily.     atorvastatin (LIPITOR) 40 MG tablet TAKE 1 TABLET(40 MG) BY MOUTH AT BEDTIME 90 tablet 1   atorvastatin (LIPITOR) 80 MG tablet TAKE 1 TABLET(40 MG) BY MOUTH AT BEDTIME 90 tablet 1   benzonatate (TESSALON) 100 MG capsule Take 1 capsule (100 mg total) by mouth every 8 (eight) hours. 24 capsule 0   Blood Glucose Monitoring Suppl (ACCU-CHEK GUIDE) w/Device KIT Use to check blood sugars three times daily E11.9 1 kit 0   carvedilol (COREG) 6.25 MG tablet TAKE 1 TABLET(6.25 MG) BY MOUTH TWICE DAILY WITH A MEAL 180 tablet 1   cholecalciferol (VITAMIN D3) 25 MCG (1000 UNIT) tablet Take 1,000 Units by mouth daily.     Cyanocobalamin (B-12 PO) Take by mouth daily.     cyclobenzaprine (FLEXERIL) 10 MG tablet Take 1 tablet (10 mg total) by mouth 3 (three) times daily as needed for muscle spasms. 30 tablet 0   empagliflozin (JARDIANCE) 10 MG TABS tablet Take 1 tablet (10 mg total) by mouth daily before breakfast. 90 tablet 1   fenofibrate (TRICOR) 145 MG tablet TAKE 1 TABLET(145 MG) BY MOUTH DAILY 90 tablet 1   glucose blood (ACCU-CHEK GUIDE) test strip CHECK BLOOD SUGAR 3 TIMES A DAY BEFORE BREAKFAST, LUNCH, AND DINNER 150 strip 2   glucose blood test strip Use as instructed to test blood sugars 3 times a day dx code e11.65 100 each 12   Icosapent Ethyl (VASCEPA) 1 g CAPS One bid for elevated triglycerides 180 capsule 0   Insulin Pen Needle (PEN NEEDLES) 32G X 4 MM MISC Inject 1 each into the skin daily. 30 each 2   lidocaine (XYLOCAINE) 2 % solution Use as directed 15 mLs in the mouth or throat as needed for mouth pain. 100 mL 0   Magnesium 250 MG TABS Take 1 tablet (250 mg total) by mouth every evening. With evening meals 30 tablet 2   modafinil (PROVIGIL) 200 MG tablet Take 1 tablet (200 mg total) by mouth daily. 30 tablet 5   Multiple Vitamins-Minerals  (MULTIVITAMIN WITH MINERALS) tablet Take 1 tablet by mouth daily.     naproxen (NAPROSYN) 375 MG tablet Take 1 tablet (375 mg total) by mouth 2 (two) times daily with a meal. 30 tablet 0   olmesartan-hydrochlorothiazide (BENICAR HCT) 40-25 MG tablet Take 1 tablet by mouth daily. 10 tablet 0   Omega-3 Fatty Acids (FISH OIL) 1000 MG CPDR Take by mouth. Take one tablet daily     omeprazole (PRILOSEC) 10 MG capsule TAKE 1 CAPSULE(10 MG) BY  MOUTH DAILY 90 capsule 1   Semaglutide, 2 MG/DOSE, (OZEMPIC, 2 MG/DOSE,) 8 MG/3ML SOPN Inject 2 mg into the skin once a week. 3 mL 1   tamsulosin (FLOMAX) 0.4 MG CAPS capsule TAKE 1 CAPSULE BY MOUTH EVERY DAY 30 MINUTES AFTER THE SAME MEAL 90 capsule 1   tiZANidine (ZANAFLEX) 4 MG tablet Take 1 tablet (4 mg total) by mouth at bedtime. 30 tablet 0   UNABLE TO FIND CPAP for OSA (6cm-19cm)     No facility-administered medications prior to visit.    PAST MEDICAL HISTORY: Past Medical History:  Diagnosis Date   Allergy    Arthritis    SHOULDERS   Balanitis    Deafness, sensorineural, childhood onset    BILATERAL SINCE AGE 36  SECONDARY TO UNKNOWN ILLNESS   GERD (gastroesophageal reflux disease)    Hyperlipidemia    Hypertension    No blood products    Patient is Jehovah Witness no blood products.   OSA on CPAP    SEVERE PER STUDY 03-09-2009   Sleep apnea    uses a cpap   Smokers' cough ([place])    Type 2 diabetes mellitus ([place])     PAST SURGICAL HISTORY: Past Surgical History:  Procedure Laterality Date   CIRCUMCISION N/A 12/19/2013   Procedure: CIRCUMCISION ADULT;  Surgeon: Lowella Bandy, MD;  Location: [facility];  Service: Urology;  Laterality: N/A;   SHOULDER SURGERY Right    SHOULDER SURGERY Left 10/15/2021   WRIST SURGERY Left     FAMILY HISTORY: Family History  Problem Relation Age of Onset   Cancer Mother        type unknown pt was 33 years old at that time   Liver disease Father    Healthy Brother    Cancer Other          alot of family members have cancer but doesnt know the type   Colon cancer Neg Hx    Esophageal cancer Neg Hx    Rectal cancer Neg Hx    Stomach cancer Neg Hx    Sleep apnea Neg Hx     SOCIAL HISTORY: Social History   Socioeconomic History   Marital status: Married    Spouse name: Not on file   Number of children: 4   Years of education: Not on file   Highest education level: Not on file  Occupational History   Occupation: unemployed  Tobacco Use   Smoking status: Former    Packs/day: 1.00    Years: 30.00    Total pack years: 30.00    Types: Cigarettes   Smokeless tobacco: Never   Tobacco comments:    quit 4 years ago  Vaping Use   Vaping Use: Never used  Substance and Sexual Activity   Alcohol use: Not Currently    Comment: rarely   Drug use: No   Sexual activity: Yes  Other Topics Concern   Not on file  Social History Narrative   Not on file   Social Determinants of Health   Financial Resource Strain: Low Risk  (04/23/2022)   Overall Financial Resource Strain (CARDIA)    Difficulty of Paying Living Expenses: Not hard at all  Food Insecurity: No Food Insecurity (04/23/2022)   Hunger Vital Sign    Worried About Running Out of Food in the Last Year: Never true    [place] in the Last Year: Never true  Transportation Needs: No Transportation Needs (04/23/2022)   [place] -  Hydrologist (Medical): No    Lack of Transportation (Non-Medical): No  Physical Activity: Inactive (04/23/2022)   Exercise Vital Sign    Days of Exercise per Week: 0 days    Minutes of Exercise per Session: 0 min  Stress: No Stress Concern Present (04/23/2022)   [place]    Feeling of Stress : Not at all  Social Connections: Not on file  Intimate Partner Violence: Not At Risk (03/09/2019)   Humiliation, Afraid, Rape, and Kick questionnaire    Fear of Current or Ex-Partner: No    Emotionally  Abused: No    Physically Abused: No    Sexually Abused: No      PHYSICAL EXAM  Vitals:   10/27/22 0909  BP: (!) 113/58  Pulse: 84  Weight: 234 lb (106.1 kg)  Height: '5\' 9"'$  (1.753 m)    Body mass index is 34.56 kg/m.  Generalized: Well developed, in no acute distress  Chest: Lungs clear to auscultation bilaterally  Neurological examination  Mentation: Alert oriented to time, place, history taking. Follows all commands speech and language fluent Cranial nerve II-XII: Extraocular movements were full, visual field were full on confrontational test Head turning and shoulder shrug  were normal and symmetric. Gait and station: Gait is normal.    DIAGNOSTIC DATA (LABS, IMAGING, TESTING) - I reviewed patient records, labs, notes, testing and imaging myself where available.  Lab Results  Component Value Date   WBC 5.3 07/15/2022   HGB 13.4 07/15/2022   HCT 40.0 07/15/2022   MCV 89.8 07/15/2022   PLT 166.0 07/15/2022      Component Value Date/Time   NA 136 10/26/2022 1017   K 4.4 10/26/2022 1017   CL 97 10/26/2022 1017   CO2 22 10/26/2022 1017   GLUCOSE 172 (H) 10/26/2022 1017   GLUCOSE 187 (H) 07/15/2022 1119   BUN 24 10/26/2022 1017   CREATININE 1.38 (H) 10/26/2022 1017   CALCIUM 10.0 10/26/2022 1017   PROT 7.2 10/26/2022 1017   ALBUMIN 4.8 10/26/2022 1017   AST 28 10/26/2022 1017   ALT 30 10/26/2022 1017   ALKPHOS 70 10/26/2022 1017   BILITOT 0.4 10/26/2022 1017   GFRNONAA 72 10/17/2020 1437   GFRAA 84 10/17/2020 1437   Lab Results  Component Value Date   CHOL 174 10/26/2022   HDL 25 (L) 10/26/2022   LDLCALC 63 10/26/2022   TRIG 567 (HH) 10/26/2022   CHOLHDL 7.0 (H) 10/26/2022   Lab Results  Component Value Date   HGBA1C 7.9 (H) 10/26/2022   No results found for: "VITAMINB12" Lab Results  Component Value Date   TSH 2.110 10/26/2022      ASSESSMENT AND PLAN 57 y.o. year old male  has a past medical history of Allergy, Arthritis, Balanitis,  Deafness, sensorineural, childhood onset, GERD (gastroesophageal reflux disease), Hyperlipidemia, Hypertension, No blood products, OSA on CPAP, Sleep apnea, Smokers' cough ([place]), and Type 2 diabetes mellitus ([place]). here with:  Obstructive sleep apnea on CPAP Hypersomnia  Good compliance Residual AHI is in normal range Encouraged patient to continue using CPAP nightly greater than 4 hours each night Continue Provigil 200 mg daily for hypersomnia-try reducing provigil to 100 mg to see if it helps with falling asleep. Follow-up in 1 year or sooner if needed      Ward Givens, MSN, NP-C 10/27/2022, 9:01 AM [facility] [address] [place], [place] [address] [phone]

## 2022-10-28 ENCOUNTER — Other Ambulatory Visit: Payer: Self-pay

## 2022-10-28 MED ORDER — OMEPRAZOLE 10 MG PO CPDR: DELAYED_RELEASE_CAPSULE | ORAL | 1 refills | Status: DC

## 2022-11-02 DIAGNOSIS — I10 Essential (primary) hypertension: Secondary | ICD-10-CM | POA: Insufficient documentation

## 2022-11-02 DIAGNOSIS — E782 Mixed hyperlipidemia: Secondary | ICD-10-CM | POA: Insufficient documentation

## 2022-11-03 ENCOUNTER — Other Ambulatory Visit: Payer: Self-pay | Admitting: Adult Health

## 2022-11-03 ENCOUNTER — Other Ambulatory Visit: Payer: Self-pay | Admitting: Neurology

## 2022-11-03 DIAGNOSIS — G4710 Hypersomnia, unspecified: Secondary | ICD-10-CM

## 2022-11-04 ENCOUNTER — Encounter: Payer: Self-pay | Admitting: Nurse Practitioner

## 2022-11-04 NOTE — Telephone Encounter (Signed)
Per chart there are no referrals or OV with the [facility].  Please advise, thanks!

## 2022-11-05 ENCOUNTER — Other Ambulatory Visit: Payer: Self-pay | Admitting: Nurse Practitioner

## 2022-11-05 ENCOUNTER — Encounter: Payer: Self-pay | Admitting: Nurse Practitioner

## 2022-11-05 DIAGNOSIS — E782 Mixed hyperlipidemia: Secondary | ICD-10-CM

## 2022-11-05 DIAGNOSIS — E781 Pure hyperglyceridemia: Secondary | ICD-10-CM

## 2022-11-11 DIAGNOSIS — R1319 Other dysphagia: Secondary | ICD-10-CM | POA: Diagnosis not present

## 2022-11-11 HISTORY — DX: Other dysphagia: R13.19

## 2022-11-12 ENCOUNTER — Other Ambulatory Visit: Payer: Self-pay | Admitting: Otolaryngology

## 2022-11-12 DIAGNOSIS — R1319 Other dysphagia: Secondary | ICD-10-CM

## 2022-11-15 ENCOUNTER — Other Ambulatory Visit: Payer: Self-pay | Admitting: Adult Health

## 2022-11-15 DIAGNOSIS — G4710 Hypersomnia, unspecified: Secondary | ICD-10-CM

## 2022-11-18 ENCOUNTER — Other Ambulatory Visit: Payer: Self-pay

## 2022-11-18 MED ORDER — OLMESARTAN MEDOXOMIL-HCTZ 40-25 MG PO TABS: 1.0000 | ORAL_TABLET | Freq: Every day | ORAL | 0 refills | Status: DC

## 2022-11-18 NOTE — Telephone Encounter (Signed)
Last visit 10/27/22 Next visit 11/03/23  Rx refill sent to [facility]

## 2022-11-19 ENCOUNTER — Ambulatory Visit (INDEPENDENT_AMBULATORY_CARE_PROVIDER_SITE_OTHER): Payer: Medicare PPO | Admitting: Nurse Practitioner

## 2022-11-19 ENCOUNTER — Encounter: Payer: Self-pay | Admitting: Nurse Practitioner

## 2022-11-19 VITALS — BP 115/58 | HR 85 | Temp 98.4°F | Ht 69.0 in | Wt 230.0 lb

## 2022-11-19 DIAGNOSIS — D229 Melanocytic nevi, unspecified: Secondary | ICD-10-CM

## 2022-11-19 MED ORDER — OLMESARTAN MEDOXOMIL-HCTZ 40-25 MG PO TABS: 1.0000 | ORAL_TABLET | Freq: Every day | ORAL | 2 refills | Status: DC

## 2022-11-19 NOTE — Patient Instructions (Addendum)
[facility] DELIVERY  Phone: [phone]   Dr Allyson Sabal - Dermatology - save this phone number so you will answer  Address: [address], [place], [place] [address] Phone: [phone]  You also need to call for an eye appt to your eye doctor

## 2022-11-19 NOTE — Progress Notes (Signed)
I,[name],acting as a Education administrator for [name], FNP.,have documented all relevant documentation on the behalf of [name], FNP,as directed by  [name], FNP while in the presence of [name], [place].    Subjective:     Patient ID: Stephen Chase , male    DOB: 10/08/1965 , 57 y.o.   MRN: [id number]   Chief Complaint  Patient presents with   Skin Concern    HPI  Patient presents today for concern about area of skin on his right temple. The area has grown in size over the past year, does itch some, does not bleed or have any other symptoms. Wants to make sure it is not cancer.   He is here today with an interpreter.      Past Medical History:  Diagnosis Date   Allergy    Arthritis    SHOULDERS   Balanitis    Deafness, sensorineural, childhood onset    BILATERAL SINCE AGE 9  SECONDARY TO UNKNOWN ILLNESS   GERD (gastroesophageal reflux disease)    Hyperlipidemia    Hypertension    No blood products    Patient is Jehovah Witness no blood products.   OSA on CPAP    SEVERE PER STUDY 03-09-2009   Sleep apnea    uses a cpap   Smokers' cough ([place])    Type 2 diabetes mellitus ([place])      Family History  Problem Relation Age of Onset   Cancer Mother        type unknown pt was 40 years old at that time   Liver disease Father    Healthy Brother    Cancer Other         alot of family members have cancer but doesnt know the type   Colon cancer Neg Hx    Esophageal cancer Neg Hx    Rectal cancer Neg Hx    Stomach cancer Neg Hx    Sleep apnea Neg Hx      Current Outpatient Medications:    Accu-Chek Softclix Lancets lancets, Use as instructed to test blood sugars 3 times a day dx code e11.65, Disp: 200 each, Rfl: 3   aspirin EC 81 MG tablet, Take 81 mg by mouth daily., Disp: , Rfl:    atorvastatin (LIPITOR) 40 MG tablet, TAKE 1 TABLET(40 MG) BY MOUTH AT BEDTIME, Disp: 90 tablet, Rfl: 1   atorvastatin (LIPITOR) 80 MG tablet, TAKE 1 TABLET(40 MG) BY MOUTH AT  BEDTIME, Disp: 90 tablet, Rfl: 1   Blood Glucose Monitoring Suppl (ACCU-CHEK GUIDE) w/Device KIT, Use to check blood sugars three times daily E11.9, Disp: 1 kit, Rfl: 0   carvedilol (COREG) 6.25 MG tablet, TAKE 1 TABLET(6.25 MG) BY MOUTH TWICE DAILY WITH A MEAL, Disp: 180 tablet, Rfl: 1   cholecalciferol (VITAMIN D3) 25 MCG (1000 UNIT) tablet, Take 1,000 Units by mouth daily., Disp: , Rfl:    Cyanocobalamin (B-12 PO), Take by mouth daily., Disp: , Rfl:    cyclobenzaprine (FLEXERIL) 10 MG tablet, Take 1 tablet (10 mg total) by mouth 3 (three) times daily as needed for muscle spasms., Disp: 30 tablet, Rfl: 0   fenofibrate (TRICOR) 145 MG tablet, TAKE 1 TABLET(145 MG) BY MOUTH DAILY, Disp: 90 tablet, Rfl: 1   glucose blood (ACCU-CHEK GUIDE) test strip, CHECK BLOOD SUGAR 3 TIMES A DAY BEFORE BREAKFAST, LUNCH, AND DINNER, Disp: 150 strip, Rfl: 2   glucose blood test strip, Use as instructed to test blood sugars 3 times  a day dx code e11.65, Disp: 100 each, Rfl: 12   Icosapent Ethyl (VASCEPA) 1 g CAPS, One bid for elevated triglycerides, Disp: 180 capsule, Rfl: 0   Insulin Pen Needle (PEN NEEDLES) 32G X 4 MM MISC, Inject 1 each into the skin daily., Disp: 30 each, Rfl: 2   JARDIANCE 10 MG TABS tablet, TAKE 1 TABLET(10 MG) BY MOUTH DAILY BEFORE BREAKFAST, Disp: 90 tablet, Rfl: 1   lidocaine (XYLOCAINE) 2 % solution, Use as directed 15 mLs in the mouth or throat as needed for mouth pain., Disp: 100 mL, Rfl: 0   Magnesium 250 MG TABS, Take 1 tablet (250 mg total) by mouth every evening. With evening meals, Disp: 30 tablet, Rfl: 2   modafinil (PROVIGIL) 200 MG tablet, TAKE 1 TABLET(200 MG) BY MOUTH DAILY, Disp: 30 tablet, Rfl: 5   Multiple Vitamins-Minerals (MULTIVITAMIN WITH MINERALS) tablet, Take 1 tablet by mouth daily., Disp: , Rfl:    naproxen (NAPROSYN) 375 MG tablet, Take 1 tablet (375 mg total) by mouth 2 (two) times daily with a meal., Disp: 30 tablet, Rfl: 0   Omega-3 Fatty Acids (FISH OIL) 1000 MG  CPDR, Take by mouth. Take one tablet daily, Disp: , Rfl:    omeprazole (PRILOSEC) 10 MG capsule, TAKE 1 CAPSULE(10 MG) BY MOUTH DAILY, Disp: 90 capsule, Rfl: 1   Semaglutide, 2 MG/DOSE, (OZEMPIC, 2 MG/DOSE,) 8 MG/3ML SOPN, Inject 2 mg into the skin once a week., Disp: 3 mL, Rfl: 1   tamsulosin (FLOMAX) 0.4 MG CAPS capsule, TAKE 1 CAPSULE BY MOUTH EVERY DAY 30 MINUTES AFTER THE SAME MEAL, Disp: 90 capsule, Rfl: 1   tiZANidine (ZANAFLEX) 4 MG tablet, Take 1 tablet (4 mg total) by mouth at bedtime., Disp: 30 tablet, Rfl: 0   UNABLE TO FIND, CPAP for OSA (6cm-19cm), Disp: , Rfl:    olmesartan-hydrochlorothiazide (BENICAR HCT) 40-25 MG tablet, Take 1 tablet by mouth daily., Disp: 90 tablet, Rfl: 2   No Known Allergies   Review of Systems  Constitutional: Negative.   Respiratory: Negative.    Cardiovascular: Negative.   Neurological: Negative.   Psychiatric/Behavioral: Negative.    All other systems reviewed and are negative.    Today's Vitals   11/19/22 1442  BP: (!) 115/58  Pulse: 85  Temp: 98.4 F (36.9 C)  TempSrc: Oral  SpO2: 94%  Weight: 230 lb (104.3 kg)  Height: '5\' 9"'$  (1.753 m)   Body mass index is 33.97 kg/m.   Objective:  Physical Exam Vitals reviewed.  Constitutional:      General: He is not in acute distress.    Appearance: Normal appearance. He is obese.  HENT:     Head: Normocephalic and atraumatic.  Eyes:     Pupils: Pupils are equal, round, and reactive to light.  Pulmonary:     Effort: Pulmonary effort is normal. No respiratory distress.     Breath sounds: No wheezing.  Musculoskeletal:     Right shoulder: Normal.  Skin:    General: Skin is warm and dry.     Capillary Refill: Capillary refill takes less than 2 seconds.  Neurological:     General: No focal deficit present.     Mental Status: He is alert and oriented to person, place, and time.     Cranial Nerves: No cranial nerve deficit.     Motor: No weakness.  Psychiatric:        Mood and Affect:  Mood normal.        Behavior:  Behavior normal.        Thought Content: Thought content normal.        Judgment: Judgment normal.         Assessment And Plan:     1. Atypical nevi Comments: Irregular shaped nevi to right temple, will refer to Dermatology for further evaluation - Ambulatory referral to Dermatology    Patient was given opportunity to ask questions. Patient verbalized understanding of the plan and was able to repeat key elements of the plan. All questions were answered to their satisfaction.  [name], FNP   I, [name], FNP, have reviewed all documentation for this visit. The documentation on 11/19/22 for the exam, diagnosis, procedures, and orders are all accurate and complete.   IF YOU HAVE BEEN REFERRED TO A SPECIALIST, IT MAY TAKE 1-2 WEEKS TO SCHEDULE/PROCESS THE REFERRAL. IF YOU HAVE NOT HEARD FROM US/SPECIALIST IN TWO WEEKS, PLEASE GIVE [place] A CALL AT [phone].   THE PATIENT IS ENCOURAGED TO PRACTICE SOCIAL DISTANCING DUE TO THE COVID-19 PANDEMIC.

## 2022-11-23 MED ORDER — TAMSULOSIN HCL 0.4 MG PO CAPS: ORAL_CAPSULE | ORAL | 3 refills | Status: DC

## 2022-11-23 MED ORDER — CARVEDILOL 6.25 MG PO TABS: ORAL_TABLET | ORAL | 3 refills | Status: DC

## 2022-11-23 NOTE — Telephone Encounter (Signed)
Rx's sent as requested. 

## 2022-11-25 ENCOUNTER — Ambulatory Visit
Admission: RE | Admit: 2022-11-25 | Discharge: 2022-11-25 | Disposition: A | Discharge status: Home / Self Care | Payer: Medicare PPO | Source: Ambulatory Visit | Attending: Nurse Practitioner | Admitting: Nurse Practitioner

## 2022-11-25 DIAGNOSIS — Z87891 Personal history of nicotine dependence: Secondary | ICD-10-CM

## 2022-12-07 ENCOUNTER — Ambulatory Visit
Admission: RE | Admit: 2022-12-07 | Discharge: 2022-12-07 | Disposition: A | Discharge status: Home / Self Care | Payer: Medicare PPO | Source: Ambulatory Visit | Attending: Otolaryngology | Admitting: Otolaryngology

## 2022-12-07 DIAGNOSIS — R1319 Other dysphagia: Secondary | ICD-10-CM

## 2022-12-08 ENCOUNTER — Ambulatory Visit (INDEPENDENT_AMBULATORY_CARE_PROVIDER_SITE_OTHER): Payer: Medicare PPO

## 2022-12-08 VITALS — BP 110/58 | HR 84 | Temp 98.5°F | Ht 69.0 in | Wt 230.0 lb

## 2022-12-08 DIAGNOSIS — Z23 Encounter for immunization: Secondary | ICD-10-CM

## 2022-12-08 NOTE — Progress Notes (Signed)
Patient presents today for a covid vaccine.  

## 2022-12-21 ENCOUNTER — Other Ambulatory Visit: Payer: Self-pay | Admitting: Otolaryngology

## 2022-12-21 ENCOUNTER — Ambulatory Visit
Admission: RE | Admit: 2022-12-21 | Discharge: 2022-12-21 | Disposition: A | Discharge status: Home / Self Care | Payer: Medicare PPO | Source: Ambulatory Visit | Attending: Otolaryngology | Admitting: Otolaryngology

## 2022-12-21 DIAGNOSIS — R1310 Dysphagia, unspecified: Secondary | ICD-10-CM | POA: Diagnosis not present

## 2022-12-21 DIAGNOSIS — R1319 Other dysphagia: Secondary | ICD-10-CM

## 2023-01-06 ENCOUNTER — Other Ambulatory Visit: Payer: Self-pay | Admitting: Pharmacist

## 2023-01-06 NOTE — Progress Notes (Signed)
Pharmacy Quality Measure Review   This patient is appearing on the insurance-provided list for being at risk of failing the adherence measure for diabetes medications this calendar year. Appears he has been filling Jardiance 10 mg consistently, but for a 30 day supply. Also prescribed Ozempic; there is a note that his [employer] enrollment ended earlier this year.   Also appears that he has both atorvastatin 40 mg and atorvastatin 80 mg on his active medication list, and both have recent fills.   Olmesartan fill appears to be up to date.   Most recent 2024 A1c is <9%; BP is <140/90. Patient does have a statin fill for 2024.   MyChart message sent to patient to discuss issues above.   [name], PharmD, BCACP, CPP [facility] [phone]

## 2023-01-28 DIAGNOSIS — D492 Neoplasm of unspecified behavior of bone, soft tissue, and skin: Secondary | ICD-10-CM | POA: Diagnosis not present

## 2023-01-28 DIAGNOSIS — L918 Other hypertrophic disorders of the skin: Secondary | ICD-10-CM | POA: Diagnosis not present

## 2023-01-28 DIAGNOSIS — L439 Lichen planus, unspecified: Secondary | ICD-10-CM | POA: Diagnosis not present

## 2023-02-05 DIAGNOSIS — D492 Neoplasm of unspecified behavior of bone, soft tissue, and skin: Secondary | ICD-10-CM | POA: Diagnosis not present

## 2023-02-18 DIAGNOSIS — M5416 Radiculopathy, lumbar region: Secondary | ICD-10-CM | POA: Diagnosis not present

## 2023-02-18 DIAGNOSIS — M5451 Vertebrogenic low back pain: Secondary | ICD-10-CM | POA: Diagnosis not present

## 2023-02-18 HISTORY — DX: Radiculopathy, lumbar region: M54.16

## 2023-02-22 ENCOUNTER — Other Ambulatory Visit: Payer: Self-pay | Admitting: Family Medicine

## 2023-02-22 DIAGNOSIS — M5416 Radiculopathy, lumbar region: Secondary | ICD-10-CM

## 2023-02-23 ENCOUNTER — Encounter: Payer: Self-pay | Admitting: Nurse Practitioner

## 2023-02-24 ENCOUNTER — Other Ambulatory Visit: Payer: Self-pay

## 2023-02-24 DIAGNOSIS — E1140 Type 2 diabetes mellitus with diabetic neuropathy, unspecified: Secondary | ICD-10-CM

## 2023-02-24 MED ORDER — OZEMPIC (2 MG/DOSE) 8 MG/3ML ~~LOC~~ SOPN: 2.0000 mg | PEN_INJECTOR | SUBCUTANEOUS | 1 refills | Status: DC

## 2023-03-11 ENCOUNTER — Other Ambulatory Visit: Payer: Self-pay | Admitting: Nurse Practitioner

## 2023-03-12 NOTE — Discharge Instructions (Signed)

## 2023-03-15 ENCOUNTER — Ambulatory Visit
Admission: RE | Admit: 2023-03-15 | Discharge: 2023-03-15 | Disposition: A | Discharge status: Home / Self Care | Payer: Medicare PPO | Source: Ambulatory Visit | Attending: Family Medicine | Admitting: Family Medicine

## 2023-03-15 DIAGNOSIS — M5416 Radiculopathy, lumbar region: Secondary | ICD-10-CM

## 2023-03-15 DIAGNOSIS — M4316 Spondylolisthesis, lumbar region: Secondary | ICD-10-CM | POA: Diagnosis not present

## 2023-03-15 DIAGNOSIS — M48062 Spinal stenosis, lumbar region with neurogenic claudication: Secondary | ICD-10-CM | POA: Diagnosis not present

## 2023-03-15 DIAGNOSIS — G4733 Obstructive sleep apnea (adult) (pediatric): Secondary | ICD-10-CM | POA: Diagnosis not present

## 2023-03-15 DIAGNOSIS — M5126 Other intervertebral disc displacement, lumbar region: Secondary | ICD-10-CM | POA: Diagnosis not present

## 2023-03-15 MED ORDER — ONDANSETRON HCL 4 MG/2ML IJ SOLN: 4.0000 mg | Freq: Once | INTRAMUSCULAR | Status: DC | PRN

## 2023-03-15 MED ORDER — MEPERIDINE HCL 50 MG/ML IJ SOLN: 50.0000 mg | Freq: Once | INTRAMUSCULAR | Status: DC | PRN

## 2023-03-15 MED ORDER — IOPAMIDOL (ISOVUE-M 200) INJECTION 41%
15.0000 mL | Freq: Once | INTRAMUSCULAR | Status: AC
  Administered 2023-03-15: 15 mL via INTRATHECAL

## 2023-03-15 MED ORDER — DIAZEPAM 5 MG PO TABS: 10.0000 mg | ORAL_TABLET | Freq: Once | ORAL | Status: DC

## 2023-04-05 ENCOUNTER — Encounter: Payer: Self-pay | Admitting: Nurse Practitioner

## 2023-04-08 ENCOUNTER — Encounter: Payer: Self-pay | Admitting: Nurse Practitioner

## 2023-04-08 ENCOUNTER — Other Ambulatory Visit: Payer: Self-pay

## 2023-04-08 DIAGNOSIS — E782 Mixed hyperlipidemia: Secondary | ICD-10-CM

## 2023-04-08 MED ORDER — FENOFIBRATE 145 MG PO TABS: ORAL_TABLET | ORAL | 1 refills | Status: DC

## 2023-04-08 MED ORDER — ATORVASTATIN CALCIUM 80 MG PO TABS
ORAL_TABLET | ORAL | 1 refills | Status: DC
Start: 2023-04-08 — End: 2023-05-06

## 2023-04-10 ENCOUNTER — Other Ambulatory Visit: Payer: Self-pay | Admitting: Nurse Practitioner

## 2023-04-15 ENCOUNTER — Other Ambulatory Visit: Payer: Self-pay

## 2023-04-15 MED ORDER — ATORVASTATIN CALCIUM 40 MG PO TABS: ORAL_TABLET | ORAL | 1 refills | Status: DC

## 2023-04-28 ENCOUNTER — Other Ambulatory Visit: Payer: Self-pay

## 2023-04-28 MED ORDER — CARVEDILOL 6.25 MG PO TABS: ORAL_TABLET | ORAL | 3 refills | Status: DC

## 2023-05-03 ENCOUNTER — Other Ambulatory Visit: Payer: Self-pay | Admitting: Nurse Practitioner

## 2023-05-03 DIAGNOSIS — E1140 Type 2 diabetes mellitus with diabetic neuropathy, unspecified: Secondary | ICD-10-CM

## 2023-05-06 ENCOUNTER — Other Ambulatory Visit: Payer: Medicare PPO | Admitting: Pharmacist

## 2023-05-06 ENCOUNTER — Encounter: Payer: Self-pay | Admitting: Nurse Practitioner

## 2023-05-06 ENCOUNTER — Ambulatory Visit (INDEPENDENT_AMBULATORY_CARE_PROVIDER_SITE_OTHER): Payer: Medicare PPO

## 2023-05-06 ENCOUNTER — Ambulatory Visit: Payer: Medicare PPO | Admitting: Nurse Practitioner

## 2023-05-06 VITALS — BP 118/66 | HR 69 | Temp 98.3°F | Ht 68.0 in | Wt 226.0 lb

## 2023-05-06 VITALS — BP 118/60 | HR 69 | Temp 98.0°F | Ht 68.0 in | Wt 226.6 lb

## 2023-05-06 DIAGNOSIS — W19XXXA Unspecified fall, initial encounter: Secondary | ICD-10-CM | POA: Diagnosis not present

## 2023-05-06 DIAGNOSIS — E782 Mixed hyperlipidemia: Secondary | ICD-10-CM

## 2023-05-06 DIAGNOSIS — G4733 Obstructive sleep apnea (adult) (pediatric): Secondary | ICD-10-CM

## 2023-05-06 DIAGNOSIS — E1140 Type 2 diabetes mellitus with diabetic neuropathy, unspecified: Secondary | ICD-10-CM | POA: Diagnosis not present

## 2023-05-06 DIAGNOSIS — E1169 Type 2 diabetes mellitus with other specified complication: Secondary | ICD-10-CM | POA: Diagnosis not present

## 2023-05-06 DIAGNOSIS — Z23 Encounter for immunization: Secondary | ICD-10-CM | POA: Diagnosis not present

## 2023-05-06 DIAGNOSIS — M25552 Pain in left hip: Secondary | ICD-10-CM

## 2023-05-06 DIAGNOSIS — E785 Hyperlipidemia, unspecified: Secondary | ICD-10-CM | POA: Diagnosis not present

## 2023-05-06 DIAGNOSIS — Z0000 Encounter for general adult medical examination without abnormal findings: Secondary | ICD-10-CM

## 2023-05-06 DIAGNOSIS — Z9989 Dependence on other enabling machines and devices: Secondary | ICD-10-CM

## 2023-05-06 DIAGNOSIS — E6609 Other obesity due to excess calories: Secondary | ICD-10-CM | POA: Diagnosis not present

## 2023-05-06 DIAGNOSIS — E1142 Type 2 diabetes mellitus with diabetic polyneuropathy: Secondary | ICD-10-CM

## 2023-05-06 DIAGNOSIS — I10 Essential (primary) hypertension: Secondary | ICD-10-CM

## 2023-05-06 DIAGNOSIS — Z6834 Body mass index (BMI) 34.0-34.9, adult: Secondary | ICD-10-CM

## 2023-05-06 DIAGNOSIS — E66811 Obesity, class 1: Secondary | ICD-10-CM | POA: Insufficient documentation

## 2023-05-06 MED ORDER — DAPAGLIFLOZIN PROPANEDIOL 10 MG PO TABS: 10.0000 mg | ORAL_TABLET | Freq: Every day | ORAL | 1 refills | Status: DC

## 2023-05-06 MED ORDER — ATORVASTATIN CALCIUM 40 MG PO TABS: ORAL_TABLET | ORAL | Status: DC

## 2023-05-06 NOTE — Assessment & Plan Note (Signed)
he is encouraged to strive for BMI less than 30 to decrease cardiac risk. Advised to aim for at least 150 minutes of exercise per week.

## 2023-05-06 NOTE — Assessment & Plan Note (Addendum)
HgbA1c is stable, blood sugars have been elevated at 200's likely due to not taking his medications regularly. He spoke with [facility] about patient assistance for Ozempic and Marcelline Deist (due to income threshold). Will continue current medications until receive info on patient assistance

## 2023-05-06 NOTE — Assessment & Plan Note (Addendum)
Cholesterol levels are slightly improved. He has filled his 40 mg atorvastatin, will change in the chart as the 80 mg dose was there but had 40 mg.  Will recheck lipid panel pending results will increase dose

## 2023-05-06 NOTE — Assessment & Plan Note (Signed)
Blood pressure is well controlled, continue current medications.

## 2023-05-06 NOTE — Progress Notes (Addendum)
[name], CMA,acting as a Neurosurgeon for Stephen Felts, FNP.,have documented all relevant documentation on the behalf of Stephen Felts, FNP,as directed by  Stephen Felts, FNP while in the presence of Stephen Felts, FNP.  Subjective:  Patient ID: Stephen Chase , male    DOB: 06/25/1966 , 57 y.o.   MRN: [id number]  Chief Complaint  Patient presents with   Hypertension   Diabetes    HPI  Patient presents today for a BP and DM follow up, patient reports compliance with medications. Patient denies any chest pain, SOB, and headaches. Patient has no other concerns today. He is having a difficult time paying for his jardiance $152. He is still taking the Ozempic and paying for it. He is taking less than 2 mg weekly to make is last longer, will run out soon. He has paid $500 for Ozempic. He is using a CPAP nightly.   BP Readings from Last 3 Encounters: 05/06/23 : 118/66 05/06/23 : 118/60 03/15/23 : (!) 108/51   Sign Language interpreter is present during visit  Diabetes He presents for his follow-up diabetic visit. He has type 2 diabetes mellitus. His disease course has been stable. Pertinent negatives for hypoglycemia include no dizziness. Pertinent negatives for diabetes include no chest pain, no polydipsia, no polyphagia and no polyuria. There are no hypoglycemic complications. Symptoms are stable. Diabetic complications include peripheral neuropathy. Risk factors for coronary artery disease include obesity, sedentary lifestyle, diabetes mellitus and male sex. Current diabetic treatment includes oral agent (dual therapy). His weight is stable. He is following a generally unhealthy diet. When asked about meal planning, he reported none. He has not had a previous visit with a dietitian. He participates in exercise intermittently (he is walking alot and working a strenuous job.). There is no change in his home blood glucose trend. (Average readings over 90 days 202. ) An ACE inhibitor/angiotensin II  receptor blocker is being taken. He does not see a podiatrist.Eye exam is current.  Hypertension This is a chronic problem. The current episode started more than 1 year ago. The problem has been gradually improving since onset. The problem is controlled. Pertinent negatives include no chest pain, palpitations or shortness of breath. There are no associated agents to hypertension. Risk factors for coronary artery disease include obesity, sedentary lifestyle, dyslipidemia, diabetes mellitus and male gender. Past treatments include ACE inhibitors. The current treatment provides significant improvement. There are no compliance problems.  There is no history of kidney disease. There is no history of chronic renal disease.     Past Medical History:  Diagnosis Date   Allergy    Arthritis    SHOULDERS   Balanitis    Deafness, sensorineural, childhood onset    BILATERAL SINCE AGE 48  SECONDARY TO UNKNOWN ILLNESS   GERD (gastroesophageal reflux disease)    Hyperlipidemia    Hypertension    No blood products    Patient is Jehovah Witness no blood products.   OSA on CPAP    SEVERE PER STUDY 03-09-2009   Sleep apnea    uses a cpap   Smokers' cough (HCC)    Type 2 diabetes mellitus (HCC)      Family History  Problem Relation Age of Onset   Cancer Mother        type unknown pt was 56 years old at that time   Liver disease Father    Healthy Brother    Cancer Other         alot of  family members have cancer but doesnt know the type   Colon cancer Neg Hx    Esophageal cancer Neg Hx    Rectal cancer Neg Hx    Stomach cancer Neg Hx    Sleep apnea Neg Hx      Current Outpatient Medications:    Accu-Chek Softclix Lancets lancets, Use as instructed to test blood sugars 3 times a day dx code e11.65, Disp: 200 each, Rfl: 3   atorvastatin (LIPITOR) 40 MG tablet, TAKE 1 TABLET(40 MG) BY MOUTH AT BEDTIME, Disp: , Rfl:    Blood Glucose Monitoring Suppl (ACCU-CHEK GUIDE) w/Device KIT, Use to check  blood sugars three times daily E11.9, Disp: 1 kit, Rfl: 0   carvedilol (COREG) 6.25 MG tablet, TAKE 1 TABLET(6.25 MG) BY MOUTH TWICE DAILY WITH A MEAL, Disp: 180 tablet, Rfl: 3   cholecalciferol (VITAMIN D3) 25 MCG (1000 UNIT) tablet, Take 1,000 Units by mouth daily., Disp: , Rfl:    Cyanocobalamin (B-12 PO), Take by mouth daily., Disp: , Rfl:    cyclobenzaprine (FLEXERIL) 10 MG tablet, Take 1 tablet (10 mg total) by mouth 3 (three) times daily as needed for muscle spasms., Disp: 30 tablet, Rfl: 0   dapagliflozin propanediol (FARXIGA) 10 MG TABS tablet, Take 1 tablet (10 mg total) by mouth daily before breakfast., Disp: 90 tablet, Rfl: 1   fenofibrate (TRICOR) 145 MG tablet, TAKE 1 TABLET(145 MG) BY MOUTH DAILY, Disp: 90 tablet, Rfl: 1   glucose blood (ACCU-CHEK GUIDE) test strip, CHECK BLOOD SUGAR 3 TIMES A DAY BEFORE BREAKFAST, LUNCH, AND DINNER, Disp: 150 strip, Rfl: 2   glucose blood test strip, Use as instructed to test blood sugars 3 times a day dx code e11.65, Disp: 100 each, Rfl: 12   icosapent Ethyl (VASCEPA) 1 g capsule, One bid for elevated triglycerides, Disp: 180 capsule, Rfl: 3   JARDIANCE 10 MG TABS tablet, TAKE 1 TABLET(10 MG) BY MOUTH DAILY BEFORE BREAKFAST, Disp: 90 tablet, Rfl: 1   lidocaine (XYLOCAINE) 2 % solution, Use as directed 15 mLs in the mouth or throat as needed for mouth pain., Disp: 100 mL, Rfl: 0   Magnesium 250 MG TABS, Take 1 tablet (250 mg total) by mouth every evening. With evening meals, Disp: 30 tablet, Rfl: 2   modafinil (PROVIGIL) 200 MG tablet, TAKE 1 TABLET(200 MG) BY MOUTH DAILY, Disp: 30 tablet, Rfl: 5   Multiple Vitamins-Minerals (MULTIVITAMIN WITH MINERALS) tablet, Take 1 tablet by mouth daily., Disp: , Rfl:    naproxen (NAPROSYN) 375 MG tablet, Take 1 tablet (375 mg total) by mouth 2 (two) times daily with a meal., Disp: 30 tablet, Rfl: 0   olmesartan-hydrochlorothiazide (BENICAR HCT) 40-25 MG tablet, Take 1 tablet by mouth daily., Disp: 90 tablet, Rfl:  2   Omega-3 Fatty Acids (FISH OIL) 1000 MG CPDR, Take by mouth. Take one tablet daily, Disp: , Rfl:    omeprazole (PRILOSEC) 10 MG capsule, TAKE 1 CAPSULE(10 MG) BY MOUTH DAILY, Disp: 90 capsule, Rfl: 1   Semaglutide, 2 MG/DOSE, (OZEMPIC, 2 MG/DOSE,) 8 MG/3ML SOPN, Inject 2 mg into the skin once a week., Disp: 3 mL, Rfl: 1   tamsulosin (FLOMAX) 0.4 MG CAPS capsule, TAKE 1 CAPSULE BY MOUTH EVERY DAY 30 MINUTES AFTER THE SAME MEAL, Disp: 90 capsule, Rfl: 3   tiZANidine (ZANAFLEX) 4 MG tablet, Take 1 tablet (4 mg total) by mouth at bedtime., Disp: 30 tablet, Rfl: 0   UNABLE TO FIND, CPAP for OSA (6cm-19cm), Disp: , Rfl:  No Known Allergies   Review of Systems  Constitutional: Negative.   HENT: Negative.    Eyes: Negative.   Respiratory: Negative.  Negative for shortness of breath.   Cardiovascular: Negative.  Negative for chest pain, palpitations and leg swelling.  Gastrointestinal: Negative.   Endocrine: Negative for polydipsia, polyphagia and polyuria.  Musculoskeletal: Negative.        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 More sore, able to move everything. Has been walking.   Neurological:  Negative for dizziness.  Psychiatric/Behavioral: Negative.       Today's Vitals   05/06/23 1051  BP: 118/66  Pulse: 69  Temp: 98.3 F (36.8 C)  TempSrc: Oral  Weight: 226 lb (102.5 kg)  Height: 5\' 8"  (1.727 m)  PainSc: 3    Body mass index is 34.36 kg/m.  Wt Readings from Last 3 Encounters:  05/18/23 230 lb 8 oz (104.6 kg)  05/06/23 226 lb (102.5 kg)  05/06/23 226 lb 9.6 oz (102.8 kg)      Objective:  Physical Exam Vitals reviewed.  Constitutional:      General: He is not in acute distress.    Appearance: Normal appearance. He is obese.  Eyes:     Pupils: Pupils are equal, round, and reactive to light.  Cardiovascular:     Rate and Rhythm: Normal rate and regular rhythm.      Pulses: Normal pulses.     Heart sounds: Normal heart sounds. No murmur heard. Pulmonary:     Effort: Pulmonary effort is normal. No respiratory distress.     Breath sounds: Normal breath sounds. No wheezing.  Musculoskeletal:     Right shoulder: Normal.  Skin:    General: Skin is warm and dry.     Capillary Refill: Capillary refill takes less than 2 seconds.  Neurological:     General: No focal deficit present.     Mental Status: He is alert and oriented to person, place, and time.     Cranial Nerves: No cranial nerve deficit.     Motor: No weakness.  Psychiatric:        Mood and Affect: Mood normal.        Behavior: Behavior normal.        Thought Content: Thought content normal.        Judgment: Judgment normal.         Assessment And Plan:  Essential hypertension Assessment & Plan: Blood pressure is well controlled, continue current medications  Orders: -     Basic metabolic panel  Type 2 diabetes mellitus with hyperlipidemia (HCC) Assessment & Plan: HgbA1c is stable, blood sugars have been elevated at 200's likely due to not taking his medications regularly. He spoke with [facility] about patient assistance for Ozempic and Marcelline Deist (due to income threshold). Will continue current medications until receive info on patient assistance  Orders: -     Basic metabolic panel -     Hemoglobin A1c -     Lipid panel -     Microalbumin / creatinine urine ratio -     Atorvastatin Calcium; TAKE 1 TABLET(40 MG) BY MOUTH AT BEDTIME  Type 2 diabetes mellitus with diabetic neuropathy, without long-term current use of insulin (HCC) Assessment & Plan: HgbA1c is stable, blood sugars have been elevated at 200's likely due to not taking his medications regularly. He spoke with [facility]  about patient assistance for Ozempic and Marcelline Deist (due to income threshold). Will continue current medications until receive info on patient assistance   OSA on  CPAP Assessment & Plan: He is doing well with his CPAP.  Benefits from wearing it nightly.  Last machine was done 5 years ago.   Class 1 obesity due to excess calories with serious comorbidity and body mass index (BMI) of 34.0 to 34.9 in adult Assessment & Plan: he is encouraged to strive for BMI less than 30 to decrease cardiac risk. Advised to aim for at least 150 minutes of exercise per week.    Fall, initial encounter Assessment & Plan: Occurred at work, did not go to be seen and does not feel he needs to at this point.    Left hip pain Assessment & Plan: Overall doing well has a little soreness.      Return for controlled DM check 4 months; [place] shingrix 2 weeks.  Patient was given opportunity to ask questions. Patient verbalized understanding of the plan and was able to repeat key elements of the plan. All questions were answered to their satisfaction.    Jeanell Sparrow, FNP, have reviewed all documentation for this visit. The documentation on 05/20/23 for the exam, diagnosis, procedures, and orders are all accurate and complete.   IF YOU HAVE BEEN REFERRED TO A SPECIALIST, IT MAY TAKE 1-2 WEEKS TO SCHEDULE/PROCESS THE REFERRAL. IF YOU HAVE NOT HEARD FROM US/SPECIALIST IN TWO WEEKS, PLEASE GIVE [place] A CALL AT [phone].

## 2023-05-06 NOTE — Assessment & Plan Note (Signed)
Occurred at work, did not go to be seen and does not feel he needs to at this point.

## 2023-05-06 NOTE — Assessment & Plan Note (Signed)
He is doing well with his CPAP.  Benefits from wearing it nightly.  Last machine was done 5 years ago.

## 2023-05-06 NOTE — Addendum Note (Signed)
Addended by: [facility] on: 05/06/2023 04:51 PM   Modules accepted: Orders

## 2023-05-06 NOTE — Patient Instructions (Signed)
Stephen Chase , Thank you for taking time to come for your Medicare Wellness Visit. I appreciate your ongoing commitment to your health goals. Please review the following plan we discussed and let me know if I can assist you in the future.   Referrals/Orders/Follow-Ups/Clinician Recommendations: none  This is a list of the screening recommended for you and due dates:  Health Maintenance  Topic Date Due   DTaP/Tdap/Td vaccine (1 - Tdap) Never done   Colon Cancer Screening  02/03/2022   Eye exam for diabetics  08/05/2022   Zoster (Shingles) Vaccine (2 of 2) 12/21/2022   COVID-19 Vaccine (6 - 2023-24 season) 02/02/2023   Yearly kidney health urinalysis for diabetes  04/16/2023   Complete foot exam   04/16/2023   Flu Shot  04/22/2023   Hemoglobin A1C  04/26/2023   Yearly kidney function blood test for diabetes  10/27/2023   Screening for Lung Cancer  11/25/2023   Medicare Annual Wellness Visit  05/05/2024   Hepatitis C Screening  Completed   HIV Screening  Completed   HPV Vaccine  Aged Out    Advanced directives: (In Chart) A copy of your advanced directives are scanned into your chart should your provider ever need it.  Next Medicare Annual Wellness Visit scheduled for next year: No, office will make appointment  Preventive Care 40-64 Years, Male Preventive care refers to lifestyle choices and visits with your health care provider that can promote health and wellness. What does preventive care include? A yearly physical exam. This is also called an annual well check. Dental exams once or twice a year. Routine eye exams. Ask your health care provider how often you should have your eyes checked. Personal lifestyle choices, including: Daily care of your teeth and gums. Regular physical activity. Eating a healthy diet. Avoiding tobacco and drug use. Limiting alcohol use. Practicing safe sex. Taking low-dose aspirin every day starting at age 72. What happens during an annual well  check? The services and screenings done by your health care provider during your annual well check will depend on your age, overall health, lifestyle risk factors, and family history of disease. Counseling  Your health care provider may ask you questions about your: Alcohol use. Tobacco use. Drug use. Emotional well-being. Home and relationship well-being. Sexual activity. Eating habits. Work and work Astronomer. Screening  You may have the following tests or measurements: Height, weight, and BMI. Blood pressure. Lipid and cholesterol levels. These may be checked every 5 years, or more frequently if you are over 70 years old. Skin check. Lung cancer screening. You may have this screening every year starting at age 4 if you have a 30-pack-year history of smoking and currently smoke or have quit within the past 15 years. Fecal occult blood test (FOBT) of the stool. You may have this test every year starting at age 10. Flexible sigmoidoscopy or colonoscopy. You may have a sigmoidoscopy every 5 years or a colonoscopy every 10 years starting at age 40. Prostate cancer screening. Recommendations will vary depending on your family history and other risks. Hepatitis C blood test. Hepatitis B blood test. Sexually transmitted disease (STD) testing. Diabetes screening. This is done by checking your blood sugar (glucose) after you have not eaten for a while (fasting). You may have this done every 1-3 years. Discuss your test results, treatment options, and if necessary, the need for more tests with your health care provider. Vaccines  Your health care provider may recommend certain vaccines, such as: Influenza  vaccine. This is recommended every year. Tetanus, diphtheria, and acellular pertussis (Tdap, Td) vaccine. You may need a Td booster every 10 years. Zoster vaccine. You may need this after age 53. Pneumococcal 13-valent conjugate (PCV13) vaccine. You may need this if you have certain  conditions and have not been vaccinated. Pneumococcal polysaccharide (PPSV23) vaccine. You may need one or two doses if you smoke cigarettes or if you have certain conditions. Talk to your health care provider about which screenings and vaccines you need and how often you need them. This information is not intended to replace advice given to you by your health care provider. Make sure you discuss any questions you have with your health care provider. Document Released: 10/04/2015 Document Revised: 05/27/2016 Document Reviewed: 07/09/2015 Elsevier Interactive Patient Education  2017 [employer].  Fall Prevention in the Home Falls can cause injuries. They can happen to people of all ages. There are many things you can do to make your home safe and to help prevent falls. What can I do on the outside of my home? Regularly fix the edges of walkways and driveways and fix any cracks. Remove anything that might make you trip as you walk through a door, such as a raised step or threshold. Trim any bushes or trees on the path to your home. Use bright outdoor lighting. Clear any walking paths of anything that might make someone trip, such as rocks or tools. Regularly check to see if handrails are loose or broken. Make sure that both sides of any steps have handrails. Any raised decks and porches should have guardrails on the edges. Have any leaves, snow, or ice cleared regularly. Use sand or salt on walking paths during winter. Clean up any spills in your garage right away. This includes oil or grease spills. What can I do in the bathroom? Use night lights. Install grab bars by the toilet and in the tub and shower. Do not use towel bars as grab bars. Use non-skid mats or decals in the tub or shower. If you need to sit down in the shower, use a plastic, non-slip stool. Keep the floor dry. Clean up any water that spills on the floor as soon as it happens. Remove soap buildup in the tub or shower  regularly. Attach bath mats securely with double-sided non-slip rug tape. Do not have throw rugs and other things on the floor that can make you trip. What can I do in the bedroom? Use night lights. Make sure that you have a light by your bed that is easy to reach. Do not use any sheets or blankets that are too big for your bed. They should not hang down onto the floor. Have a firm chair that has side arms. You can use this for support while you get dressed. Do not have throw rugs and other things on the floor that can make you trip. What can I do in the kitchen? Clean up any spills right away. Avoid walking on wet floors. Keep items that you use a lot in easy-to-reach places. If you need to reach something above you, use a strong step stool that has a grab bar. Keep electrical cords out of the way. Do not use floor polish or wax that makes floors slippery. If you must use wax, use non-skid floor wax. Do not have throw rugs and other things on the floor that can make you trip. What can I do with my stairs? Do not leave any items on the  stairs. Make sure that there are handrails on both sides of the stairs and use them. Fix handrails that are broken or loose. Make sure that handrails are as long as the stairways. Check any carpeting to make sure that it is firmly attached to the stairs. Fix any carpet that is loose or worn. Avoid having throw rugs at the top or bottom of the stairs. If you do have throw rugs, attach them to the floor with carpet tape. Make sure that you have a light switch at the top of the stairs and the bottom of the stairs. If you do not have them, ask someone to add them for you. What else can I do to help prevent falls? Wear shoes that: Do not have high heels. Have rubber bottoms. Are comfortable and fit you well. Are closed at the toe. Do not wear sandals. If you use a stepladder: Make sure that it is fully opened. Do not climb a closed stepladder. Make sure that  both sides of the stepladder are locked into place. Ask someone to hold it for you, if possible. Clearly mark and make sure that you can see: Any grab bars or handrails. First and last steps. Where the edge of each step is. Use tools that help you move around (mobility aids) if they are needed. These include: Canes. Walkers. Scooters. Crutches. Turn on the lights when you go into a dark area. Replace any light bulbs as soon as they burn out. Set up your furniture so you have a clear path. Avoid moving your furniture around. If any of your floors are uneven, fix them. If there are any pets around you, be aware of where they are. Review your medicines with your doctor. Some medicines can make you feel dizzy. This can increase your chance of falling. Ask your doctor what other things that you can do to help prevent falls. This information is not intended to replace advice given to you by your health care provider. Make sure you discuss any questions you have with your health care provider. Document Released: 07/04/2009 Document Revised: 02/13/2016 Document Reviewed: 10/12/2014 Elsevier Interactive Patient Education  2017 [employer].

## 2023-05-06 NOTE — Progress Notes (Signed)
Care Coordination   Met with patient while in clinic today. Reports he has hit the Medicare Coverage Gap, as copays for Ozempic and Jardiance have increased. He had applied for Ozempic assistance in 2023, but not 2024.   Patient is unsure if his income will meet criteria for Jardiance, so recommend to change to Farxiga 10 mg daily as the income cut off is higher for [employer]. Will collaborate with PCP and CPhT team to apply for assistance.   [name], PharmD, BCACP, CPP Clinical Pharmacist [facility] [phone]

## 2023-05-06 NOTE — Assessment & Plan Note (Signed)
Overall doing well has a little soreness.

## 2023-05-06 NOTE — Progress Notes (Signed)
TDAP was administered today and billed through [employer] per consent of patient.   Subjective:   Stephen Chase is a 57 y.o. male who presents for Medicare Annual/Subsequent preventive examination.  Visit Complete: In person    Review of Systems     Cardiac Risk Factors include: diabetes mellitus;male gender;obesity (BMI >30kg/m2)     Objective:    Today's Vitals   05/06/23 1014 05/06/23 1015  BP: 118/60   Pulse: 69   Temp: 98 F (36.7 C)   TempSrc: Oral   SpO2: 96%   Weight: 226 lb 9.6 oz (102.8 kg)   Height: 5\' 8"  (1.727 m)   PainSc:  3    Body mass index is 34.45 kg/m.     05/06/2023   10:31 AM 04/23/2022    8:48 AM 10/28/2021    2:46 PM 04/17/2021    9:27 AM 04/10/2020   11:07 AM 03/09/2019    1:00 PM 12/19/2013    9:35 AM  Advanced Directives  Does Patient Have a Medical Advance Directive? Yes Yes No Yes Yes No Patient does not have advance directive;Patient would like information  Type of Advance Directive Healthcare Power of [place];Living will Healthcare Power of [place];Living will  Healthcare Power of [employer] of [place];Living will    Copy of Healthcare Power of Attorney in Chart? Yes - validated most recent copy scanned in chart (See row information) Yes - validated most recent copy scanned in chart (See row information)  Yes - validated most recent copy scanned in chart (See row information) Yes - validated most recent copy scanned in chart (See row information)    Would patient like information on creating a medical advance directive?   No - Patient declined   No - Patient declined Advance directive packet given    Current Medications (verified) Outpatient Encounter Medications as of 05/06/2023  Medication Sig   Accu-Chek Softclix Lancets lancets Use as instructed to test blood sugars 3 times a day dx code e11.65   atorvastatin (LIPITOR) 80 MG tablet TAKE 1 TABLET(40 MG) BY MOUTH AT BEDTIME   Blood Glucose Monitoring Suppl (ACCU-CHEK  GUIDE) w/Device KIT Use to check blood sugars three times daily E11.9   carvedilol (COREG) 6.25 MG tablet TAKE 1 TABLET(6.25 MG) BY MOUTH TWICE DAILY WITH A MEAL   cholecalciferol (VITAMIN D3) 25 MCG (1000 UNIT) tablet Take 1,000 Units by mouth daily.   Cyanocobalamin (B-12 PO) Take by mouth daily.   cyclobenzaprine (FLEXERIL) 10 MG tablet Take 1 tablet (10 mg total) by mouth 3 (three) times daily as needed for muscle spasms.   fenofibrate (TRICOR) 145 MG tablet TAKE 1 TABLET(145 MG) BY MOUTH DAILY   glucose blood (ACCU-CHEK GUIDE) test strip CHECK BLOOD SUGAR 3 TIMES A DAY BEFORE BREAKFAST, LUNCH, AND DINNER   glucose blood test strip Use as instructed to test blood sugars 3 times a day dx code e11.65   Icosapent Ethyl (VASCEPA) 1 g CAPS One bid for elevated triglycerides   JARDIANCE 10 MG TABS tablet TAKE 1 TABLET(10 MG) BY MOUTH DAILY BEFORE BREAKFAST   lidocaine (XYLOCAINE) 2 % solution Use as directed 15 mLs in the mouth or throat as needed for mouth pain.   Magnesium 250 MG TABS Take 1 tablet (250 mg total) by mouth every evening. With evening meals   modafinil (PROVIGIL) 200 MG tablet TAKE 1 TABLET(200 MG) BY MOUTH DAILY   Multiple Vitamins-Minerals (MULTIVITAMIN WITH MINERALS) tablet Take 1 tablet by mouth daily.   naproxen (  NAPROSYN) 375 MG tablet Take 1 tablet (375 mg total) by mouth 2 (two) times daily with a meal.   olmesartan-hydrochlorothiazide (BENICAR HCT) 40-25 MG tablet Take 1 tablet by mouth daily.   Omega-3 Fatty Acids (FISH OIL) 1000 MG CPDR Take by mouth. Take one tablet daily   omeprazole (PRILOSEC) 10 MG capsule TAKE 1 CAPSULE(10 MG) BY MOUTH DAILY   Semaglutide, 2 MG/DOSE, (OZEMPIC, 2 MG/DOSE,) 8 MG/3ML SOPN Inject 2 mg into the skin once a week.   tamsulosin (FLOMAX) 0.4 MG CAPS capsule TAKE 1 CAPSULE BY MOUTH EVERY DAY 30 MINUTES AFTER THE SAME MEAL   tiZANidine (ZANAFLEX) 4 MG tablet Take 1 tablet (4 mg total) by mouth at bedtime.   UNABLE TO FIND CPAP for OSA  (6cm-19cm)   aspirin EC 81 MG tablet Take 81 mg by mouth daily. (Patient not taking: Reported on 05/06/2023)   atorvastatin (LIPITOR) 40 MG tablet TAKE 1 TABLET(40 MG) BY MOUTH AT BEDTIME (Patient not taking: Reported on 05/06/2023)   No facility-administered encounter medications on file as of 05/06/2023.    Allergies (verified) Patient has no known allergies.   History: Past Medical History:  Diagnosis Date   Allergy    Arthritis    SHOULDERS   Balanitis    Deafness, sensorineural, childhood onset    BILATERAL SINCE AGE 39  SECONDARY TO UNKNOWN ILLNESS   GERD (gastroesophageal reflux disease)    Hyperlipidemia    Hypertension    No blood products    Patient is Jehovah Witness no blood products.   OSA on CPAP    SEVERE PER STUDY 03-09-2009   Sleep apnea    uses a cpap   Smokers' cough (HCC)    Type 2 diabetes mellitus (HCC)    Past Surgical History:  Procedure Laterality Date   CIRCUMCISION N/A 12/19/2013   Procedure: CIRCUMCISION ADULT;  Surgeon: Su Grand, MD;  Location: [facility];  Service: Urology;  Laterality: N/A;   SHOULDER SURGERY Right    SHOULDER SURGERY Left 10/15/2021   WRIST SURGERY Left    Family History  Problem Relation Age of Onset   Cancer Mother        type unknown pt was 31 years old at that time   Liver disease Father    Healthy Brother    Cancer Other         alot of family members have cancer but doesnt know the type   Colon cancer Neg Hx    Esophageal cancer Neg Hx    Rectal cancer Neg Hx    Stomach cancer Neg Hx    Sleep apnea Neg Hx    Social History   Socioeconomic History   Marital status: Married    Spouse name: Not on file   Number of children: 4   Years of education: Not on file   Highest education level: Not on file  Occupational History   Occupation: unemployed  Tobacco Use   Smoking status: Former    Current packs/day: 1.00    Average packs/day: 1 pack/day for 30.0 years (30.0 ttl pk-yrs)    Types:  Cigarettes   Smokeless tobacco: Never   Tobacco comments:    quit 4 years ago  Vaping Use   Vaping status: Never Used  Substance and Sexual Activity   Alcohol use: Not Currently    Comment: rarely   Drug use: No   Sexual activity: Yes  Other Topics Concern   Not on file  Social  History Narrative   Not on file   Social Determinants of Health   Financial Resource Strain: Low Risk  (05/06/2023)   Overall Financial Resource Strain (CARDIA)    Difficulty of Paying Living Expenses: Not hard at all  Food Insecurity: No Food Insecurity (05/06/2023)   Hunger Vital Sign    Worried About Running Out of Food in the Last Year: Never true    Ran Out of Food in the Last Year: Never true  Transportation Needs: No Transportation Needs (05/06/2023)   PRAPARE - Administrator, Civil Service (Medical): No    Lack of Transportation (Non-Medical): No  Physical Activity: Insufficiently Active (05/06/2023)   Exercise Vital Sign    Days of Exercise per Week: 5 days    Minutes of Exercise per Session: 20 min  Stress: No Stress Concern Present (05/06/2023)   [employer] of Occupational Health - Occupational Stress Questionnaire    Feeling of Stress : Not at all  Social Connections: Moderately Integrated (05/06/2023)   Social Connection and Isolation Panel [NHANES]    Frequency of Communication with Friends and Family: More than three times a week    Frequency of Social Gatherings with Friends and Family: More than three times a week    Attends Religious Services: More than 4 times per year    Active Member of [employer] or Organizations: No    Attends Engineer, structural: Never    Marital Status: Married    Tobacco Counseling Counseling given: Not Answered Tobacco comments: quit 4 years ago   Clinical Intake:  Pre-visit preparation completed: Yes  Pain : 0-10 Pain Score: 3  Pain Type: Acute pain Pain Location: Hip Pain Orientation: Left Pain Descriptors / Indicators:  Aching Pain Onset: Yesterday Pain Frequency: Intermittent     Nutritional Status: BMI > 30  Obese Nutritional Risks: None Diabetes: Yes CBG done?: No Did pt. bring in CBG monitor from home?: No  How often do you need to have someone help you when you read instructions, pamphlets, or other written materials from your doctor or pharmacy?: 1 - Never  Interpreter Needed?: Yes Interpreter Agency: [facility] Interpreter Name: Alesia Richards Patient Declined Interpreter : No  Information entered by :: NAllen LPN   Activities of Daily Living    05/06/2023   10:18 AM  In your present state of health, do you have any difficulty performing the following activities:  Hearing? 1  Comment patient deaf  Vision? 0  Difficulty concentrating or making decisions? 1  Comment sometimes  Walking or climbing stairs? 0  Dressing or bathing? 1  Doing errands, shopping? 1  Preparing Food and eating ? N  Using the Toilet? N  In the past six months, have you accidently leaked urine? N  Do you have problems with loss of bowel control? N  Managing your Medications? N  Managing your Finances? N  Housekeeping or managing your Housekeeping? N    Patient Care Team: [name], FNP as PCP - General (General Practice) Caud[place], Maryjane Hurter, [facility] (Inactive) (Pharmacist)  Indicate any recent Medical Services you may have received from other than [facility] providers in the past year (date may be approximate).     Assessment:   This is a routine wellness examination for Stephen Chase.  Hearing/Vision screen Hearing Screening - Comments:: Patient is deaf Vision Screening - Comments:: Regular eye exams,   Dietary issues and exercise activities discussed:     Goals Addressed  This Visit's Progress    Patient Stated       05/06/2023, take care of self, keep active and moving       Depression Screen    05/06/2023   10:35 AM 06/17/2022   10:08 AM 04/23/2022    8:53 AM 04/15/2022   10:44 AM  04/17/2021    9:29 AM 04/10/2020   11:09 AM 04/20/2019    8:39 AM  PHQ 2/9 Scores  PHQ - 2 Score 0 0 0 0 0 0 0    Fall Risk    05/06/2023   10:34 AM 06/17/2022   10:08 AM 04/23/2022    8:50 AM 04/15/2022   10:44 AM 04/17/2021    9:28 AM  Fall Risk   Falls in the past year? 1 1 1  0 1  Comment tripped  slipped taking the dog out  did not clear a step  Number falls in past yr: 0 0 0 0 0  Injury with Fall? 0 0 0 0 0  Risk for fall due to : Medication side effect No Fall Risks Medication side effect No Fall Risks Medication side effect  Follow up Falls prevention discussed;Falls evaluation completed Falls evaluation completed Falls evaluation completed;Education provided;Falls prevention discussed Falls evaluation completed Falls evaluation completed;Education provided;Falls prevention discussed    MEDICARE RISK AT HOME:  Medicare Risk at Home - 05/06/23 1034     Any stairs in or around the home? Yes    If so, are there any without handrails? No    Home free of loose throw rugs in walkways, pet beds, electrical cords, etc? Yes    Adequate lighting in your home to reduce risk of falls? Yes    Life alert? No    Use of a cane, walker or w/c? No    Grab bars in the bathroom? No    Shower chair or bench in shower? No    Elevated toilet seat or a handicapped toilet? No             TIMED UP AND GO:  Was the test performed?  Yes  Length of time to ambulate 10 feet: 5 sec Gait steady and fast without use of assistive device    Cognitive Function:        Immunizations Immunization History  Administered Date(s) Administered   COVID-19, mRNA, vaccine(Comirnaty)12 years and older 12/08/2022   Hepatitis B, ADULT 03/03/2017   Hepb-cpg 07/13/2018, 08/17/2018, 05/12/2019   Influenza,inj,Quad PF,6+ Mos 07/13/2018, 05/31/2019, 07/17/2020, 08/11/2021, 06/17/2022   Janssen (J&J) SARS-COV-2 Vaccination 12/28/2019   Moderna Covid-19 Vaccine Bivalent Booster 67yrs & up 09/16/2021   Moderna  Sars-Covid-2 Vaccination 08/06/2020, 04/25/2021   Pneumococcal Polysaccharide-23 08/11/2021   Tdap 05/06/2023   Zoster Recombinant(Shingrix) 10/26/2022    TDAP status: Completed at today's visit  Flu Vaccine status: Due, Education has been provided regarding the importance of this vaccine. Advised may receive this vaccine at local pharmacy or Health Dept. Aware to provide a copy of the vaccination record if obtained from local pharmacy or Health Dept. Verbalized acceptance and understanding.  Pneumococcal vaccine status: Up to date  Covid-19 vaccine status: Completed vaccines  Qualifies for Shingles Vaccine? Yes   Zostavax completed No   Shingrix Completed?: No.    Education has been provided regarding the importance of this vaccine. Patient has been advised to call insurance company to determine out of pocket expense if they have not yet received this vaccine. Advised may also receive vaccine at local pharmacy or Health  Dept. Verbalized acceptance and understanding.  Screening Tests Health Maintenance  Topic Date Due   Colonoscopy  02/03/2022   OPHTHALMOLOGY EXAM  08/05/2022   Zoster Vaccines- Shingrix (2 of 2) 12/21/2022   COVID-19 Vaccine (6 - 2023-24 season) 02/02/2023   Diabetic kidney evaluation - Urine ACR  04/16/2023   FOOT EXAM  04/16/2023   INFLUENZA VACCINE  04/22/2023   HEMOGLOBIN A1C  04/26/2023   Diabetic kidney evaluation - eGFR measurement  10/27/2023   Lung Cancer Screening  11/25/2023   Medicare Annual Wellness (AWV)  05/05/2024   DTaP/Tdap/Td (2 - Td or Tdap) 05/05/2033   Hepatitis C Screening  Completed   HIV Screening  Completed   HPV VACCINES  Aged Out    Health Maintenance  Health Maintenance Due  Topic Date Due   Colonoscopy  02/03/2022   OPHTHALMOLOGY EXAM  08/05/2022   Zoster Vaccines- Shingrix (2 of 2) 12/21/2022   COVID-19 Vaccine (6 - 2023-24 season) 02/02/2023   Diabetic kidney evaluation - Urine ACR  04/16/2023   FOOT EXAM  04/16/2023    INFLUENZA VACCINE  04/22/2023   HEMOGLOBIN A1C  04/26/2023    Colorectal cancer screening: Type of screening: Colonoscopy. Completed 02/03/2017. Repeat every 5 years  Lung Cancer Screening: (Low Dose CT Chest recommended if Age 79-80 years, 20 pack-year currently smoking OR have quit w/in 15years.) does qualify.   Lung Cancer Screening Referral: CT scan 11/25/2022  Additional Screening:  Hepatitis C Screening: does qualify; Completed 02/05/2017  Vision Screening: Recommended annual ophthalmology exams for early detection of glaucoma and other disorders of the eye. Is the patient up to date with their annual eye exam?  No  Who is the provider or what is the name of the office in which the patient attends annual eye exams? Can't remember name If pt is not established with a provider, would they like to be referred to a provider to establish care? No .   Dental Screening: Recommended annual dental exams for proper oral hygiene  Diabetic Foot Exam: Diabetic Foot Exam: Overdue, Pt has been advised about the importance in completing this exam. Pt is scheduled for diabetic foot exam on next appointment.  Community Resource Referral / Chronic Care Management: CRR required this visit?  No   CCM required this visit?  No     Plan:     I have personally reviewed and noted the following in the patient's chart:   Medical and social history Use of alcohol, tobacco or illicit drugs  Current medications and supplements including opioid prescriptions. Patient is not currently taking opioid prescriptions. Functional ability and status Nutritional status Physical activity Advanced directives List of other physicians Hospitalizations, surgeries, and ER visits in previous 12 months Vitals Screenings to include cognitive, depression, and falls Referrals and appointments  In addition, I have reviewed and discussed with patient certain preventive protocols, quality metrics, and best practice  recommendations. A written personalized care plan for preventive services as well as general preventive health recommendations were provided to patient.     Barb Merino, LPN   12/28/8117   After Visit Summary: in person  Nurse Notes: none

## 2023-05-07 DIAGNOSIS — M47816 Spondylosis without myelopathy or radiculopathy, lumbar region: Secondary | ICD-10-CM | POA: Diagnosis not present

## 2023-05-07 HISTORY — DX: Spondylosis without myelopathy or radiculopathy, lumbar region: M47.816

## 2023-05-07 LAB — BASIC METABOLIC PANEL
BUN/Creatinine Ratio: 19 (ref 9–20)
BUN: 29 mg/dL — ABNORMAL HIGH (ref 6–24)
CO2: 24 mmol/L (ref 20–29)
Calcium: 10.1 mg/dL (ref 8.7–10.2)
Chloride: 99 mmol/L (ref 96–106)
Creatinine, Ser: 1.51 mg/dL — ABNORMAL HIGH (ref 0.76–1.27)
Glucose: 183 mg/dL — ABNORMAL HIGH (ref 70–99)
Potassium: 5 mmol/L (ref 3.5–5.2)
Sodium: 137 mmol/L (ref 134–144)
eGFR: 54 mL/min/{1.73_m2} — ABNORMAL LOW (ref >59)

## 2023-05-07 LAB — LIPID PANEL
Chol/HDL Ratio: 9.3 ratio — ABNORMAL HIGH (ref 0.0–5.0)
Cholesterol, Total: 223 mg/dL — ABNORMAL HIGH (ref 100–199)
HDL: 24 mg/dL — ABNORMAL LOW (ref >39)
LDL Chol Calc (NIH): 79 mg/dL (ref 0–99)
Triglycerides: 748 mg/dL (ref 0–149)
VLDL Cholesterol Cal: 120 mg/dL — ABNORMAL HIGH (ref 5–40)

## 2023-05-07 LAB — MICROALBUMIN / CREATININE URINE RATIO
Creatinine, Urine: 66.3 mg/dL
Microalb/Creat Ratio: 5 mg/g{creat} (ref 0–29)
Microalbumin, Urine: 3.4 ug/mL

## 2023-05-07 LAB — HEMOGLOBIN A1C
Est. average glucose Bld gHb Est-mCnc: 197 mg/dL
Hgb A1c MFr Bld: 8.5 % — ABNORMAL HIGH (ref 4.8–5.6)

## 2023-05-10 ENCOUNTER — Other Ambulatory Visit: Payer: Self-pay | Admitting: Physical Medicine and Rehabilitation

## 2023-05-10 DIAGNOSIS — N2889 Other specified disorders of kidney and ureter: Secondary | ICD-10-CM

## 2023-05-11 ENCOUNTER — Telehealth: Payer: Self-pay | Admitting: Internal Medicine

## 2023-05-11 NOTE — Telephone Encounter (Signed)
Per patient's appointment desk, there is an interpreter confirmed for patient appointment. Patient and wife notified.

## 2023-05-11 NOTE — Telephone Encounter (Signed)
Pt's wife calling to make sure there is going to be an onsite interpreter at the office visit on  8/27. Please advise.

## 2023-05-13 ENCOUNTER — Other Ambulatory Visit: Payer: Self-pay | Admitting: Adult Health

## 2023-05-13 DIAGNOSIS — G4710 Hypersomnia, unspecified: Secondary | ICD-10-CM

## 2023-05-18 ENCOUNTER — Encounter (HOSPITAL_BASED_OUTPATIENT_CLINIC_OR_DEPARTMENT_OTHER): Payer: Self-pay | Admitting: Internal Medicine

## 2023-05-18 ENCOUNTER — Ambulatory Visit (HOSPITAL_BASED_OUTPATIENT_CLINIC_OR_DEPARTMENT_OTHER): Payer: Medicare PPO | Admitting: Internal Medicine

## 2023-05-18 VITALS — BP 95/61 | HR 62 | Ht 69.0 in | Wt 230.5 lb

## 2023-05-18 DIAGNOSIS — J029 Acute pharyngitis, unspecified: Secondary | ICD-10-CM

## 2023-05-18 DIAGNOSIS — R5383 Other fatigue: Secondary | ICD-10-CM

## 2023-05-18 DIAGNOSIS — I2510 Atherosclerotic heart disease of native coronary artery without angina pectoris: Secondary | ICD-10-CM

## 2023-05-18 DIAGNOSIS — I2584 Coronary atherosclerosis due to calcified coronary lesion: Secondary | ICD-10-CM | POA: Diagnosis not present

## 2023-05-18 DIAGNOSIS — R0602 Shortness of breath: Secondary | ICD-10-CM | POA: Diagnosis not present

## 2023-05-18 DIAGNOSIS — H524 Presbyopia: Secondary | ICD-10-CM | POA: Diagnosis not present

## 2023-05-18 DIAGNOSIS — E782 Mixed hyperlipidemia: Secondary | ICD-10-CM | POA: Diagnosis not present

## 2023-05-18 DIAGNOSIS — E119 Type 2 diabetes mellitus without complications: Secondary | ICD-10-CM | POA: Diagnosis not present

## 2023-05-18 LAB — HM DIABETES EYE EXAM

## 2023-05-18 MED ORDER — ICOSAPENT ETHYL 1 G PO CAPS: ORAL_CAPSULE | ORAL | 3 refills | Status: DC

## 2023-05-18 NOTE — Patient Instructions (Addendum)
Medication Instructions:  START- Vascepa 1000 mg by mouth twice a day  *If you need a refill on your cardiac medications before your next appointment, please call your pharmacy*   Lab Work: None Ordered   Testing/Proc How to Prepare for Your Cardiac PET/CT Stress Test:  1. Please do not take these medications before your test:   Medications that may interfere with the cardiac pharmacological stress agent (ex. nitrates - including erectile dysfunction medications, isosorbide mononitrate, tamulosin or beta-blockers) the day of the exam. (Erectile dysfunction medication should be held for at least 72 hrs prior to test) Theophylline containing medications for 12 hours. Dipyridamole 48 hours prior to the test. Your remaining medications may be taken with water.  2. Nothing to eat or drink, except water, 3 hours prior to arrival time.   NO caffeine/decaffeinated products, or chocolate 12 hours prior to arrival.  3. NO perfume, cologne or lotion  4. Total time is 1 to 2 hours; you may want to bring reading material for the waiting time.  5. Please report to Radiology at the [facility] Main Entrance 30 minutes early for your test.  [address] [place], [place] [address]  6. Please report to Radiology at [facility] Main Entrance, medical mall, 30 mins prior to your test.  [address]  [place], [place]  [phone]  Diabetic Preparation:  Hold oral medications. You may take NPH and Lantus insulin. Do not take Humalog or Humulin R (Regular Insulin) the day of your test. Check blood sugars prior to leaving the house. If able to eat breakfast prior to 3 hour fasting, you may take all medications, including your insulin, Do not worry if you miss your breakfast dose of insulin - start at your next meal.  IF YOU THINK YOU MAY BE PREGNANT, OR ARE NURSING PLEASE INFORM THE TECHNOLOGIST.  In preparation for your appointment, medication and  supplies will be purchased.  Appointment availability is limited, so if you need to cancel or reschedule, please call the Radiology Department at [phone] [facility]) OR [phone] [facility])  24 hours in advance to avoid a cancellation fee of $100.00  What to Expect After you Arrive:  Once you arrive and check in for your appointment, you will be taken to a preparation room within the Radiology Department.  A technologist or Nurse will obtain your medical history, verify that you are correctly prepped for the exam, and explain the procedure.  Afterwards,  an IV will be started in your arm and electrodes will be placed on your skin for EKG monitoring during the stress portion of the exam. Then you will be escorted to the PET/CT scanner.  There, staff will get you positioned on the scanner and obtain a blood pressure and EKG.  During the exam, you will continue to be connected to the EKG and blood pressure machines.  A small, safe amount of a radioactive tracer will be injected in your IV to obtain a series of pictures of your heart along with an injection of a stress agent.    After your Exam:  It is recommended that you eat a meal and drink a caffeinated beverage to counter act any effects of the stress agent.  Drink plenty of fluids for the remainder of the day and urinate frequently for the first couple of hours after the exam.  Your doctor will inform you of your test results within 7-10 business days.  For more information and frequently  asked questions, please visit our website : [email or web address]  For questions about your test or how to prepare for your test, please call: Cardiac Imaging Nurse Navigators Office: [phone]   Follow-Up: At [facility], you and your health needs are our priority.  As part of our continuing mission to provide you with exceptional heart care, we have created designated Provider Care Teams.  These Care Teams include your primary  Cardiologist (physician) and Advanced Practice Providers (APPs -  Physician Assistants and Nurse Practitioners) who all work together to provide you with the care you need, when you need it.  We recommend signing up for the patient portal called "MyChart".  Sign up information is provided on this After Visit Summary.  MyChart is used to connect with patients for Virtual Visits (Telemedicine).  Patients are able to view lab/test results, encounter notes, upcoming appointments, etc.  Non-urgent messages can be sent to your provider as well.   To learn more about what you can do with MyChart, go to [email or web address].    Your next appointment:   3-4 month(s)  Provider:   K. Italy Hilty, MD    Other Instructions

## 2023-05-18 NOTE — Progress Notes (Signed)
LIPID CLINIC CONSULT NOTE  Chief Complaint:  Manage dyslipidemia  Primary Care Physician: [name], FNP  Primary Cardiologist:  None  HPI:  Stephen Chase is a 57 y.o. male who is being seen today for the evaluation of dyslipidemia at the request of [name], FNP.  This is a pleasant 57 year old male kindly referred for evaluation management of dyslipidemia.  He is seen today with his partner and a professional sign language interpreter.  He is referred for evaluation management of dyslipidemia, specifically elevated triglycerides.  His last lipid profile showed a total cholesterol of 223, triglycerides 748, HDL 24 and LDL 79.  He has had intermittent high triglycerides in the past.  Therapy for this includes atorvastatin 40 mg nightly, fenofibrate on 45 mg daily and intermittently he takes fish oil.  He was prescribed Vascepa at 1 point however he said they had not filled that medication.  Of note he has a past smoking history of about 30 pack years.  He quit 8 years ago.  He had a CT scan for lung cancer screening in March of this year, which showed no evidence of mass or tumor, however there were findings consistent with COPD, hepatic steatosis, aortic atherosclerosis, and left main and three-vessel coronary artery calcification.  I did explain these findings to him today indicating that he does have coronary artery disease.  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  He is not currently on any inhalers or treatments for COPD.  He specifically denied any chest pain.  PMHx:  Past Medical History:  Diagnosis Date   Allergy    Arthritis    SHOULDERS   Balanitis    Deafness, sensorineural, childhood onset    BILATERAL SINCE AGE 57  SECONDARY TO UNKNOWN ILLNESS   GERD (gastroesophageal reflux disease)    Hyperlipidemia    Hypertension    No blood products     Patient is Jehovah Witness no blood products.   OSA on CPAP    SEVERE PER STUDY 03-09-2009   Sleep apnea    uses a cpap   Smokers' cough (HCC)    Type 2 diabetes mellitus (HCC)     Past Surgical History:  Procedure Laterality Date   CIRCUMCISION N/A 12/19/2013   Procedure: CIRCUMCISION ADULT;  Surgeon: Su Grand, MD;  [facility];  Service: Urology;  Laterality: N/A;   SHOULDER SURGERY Right    SHOULDER SURGERY Left 10/15/2021   WRIST SURGERY Left     FAMHx:  Family History  Problem Relation Age of Onset   Cancer Mother        type unknown pt was 54 years old at that time   Liver disease Father    Healthy Brother    Cancer Other         alot of family members have cancer but doesnt know the type   Colon cancer Neg Hx    Esophageal cancer Neg Hx    Rectal cancer Neg Hx    Stomach cancer Neg Hx    Sleep apnea Neg Hx     SOCHx:   reports that he has quit smoking. His smoking use included cigarettes. He has a 30 pack-year smoking history. He has never used smokeless tobacco. He reports that he does not currently use alcohol. He reports that he does not use drugs.  ALLERGIES:  No Known Allergies  ROS: Pertinent items noted in HPI and remainder of comprehensive ROS otherwise negative.  HOME MEDS: Current Outpatient Medications on File Prior to Visit  Medication Sig Dispense Refill   Accu-Chek Softclix Lancets lancets Use as instructed to test blood sugars 3 times a day dx code e11.65 200 each 3   atorvastatin (LIPITOR) 40 MG tablet TAKE 1 TABLET(40 MG) BY MOUTH AT BEDTIME     Blood Glucose Monitoring Suppl (ACCU-CHEK GUIDE) w/Device KIT Use to check blood sugars three times daily E11.9 1 kit 0   carvedilol (COREG) 6.25 MG tablet TAKE 1 TABLET(6.25 MG) BY MOUTH TWICE DAILY WITH A MEAL 180 tablet 3   cholecalciferol (VITAMIN D3) 25 MCG (1000 UNIT) tablet Take 1,000 Units by mouth daily.     Cyanocobalamin (B-12 PO) Take by mouth daily.      cyclobenzaprine (FLEXERIL) 10 MG tablet Take 1 tablet (10 mg total) by mouth 3 (three) times daily as needed for muscle spasms. 30 tablet 0   dapagliflozin propanediol (FARXIGA) 10 MG TABS tablet Take 1 tablet (10 mg total) by mouth daily before breakfast. 90 tablet 1   fenofibrate (TRICOR) 145 MG tablet TAKE 1 TABLET(145 MG) BY MOUTH DAILY 90 tablet 1   glucose blood (ACCU-CHEK GUIDE) test strip CHECK BLOOD SUGAR 3 TIMES A DAY BEFORE BREAKFAST, LUNCH, AND DINNER 150 strip 2   glucose blood test strip Use as instructed to test blood sugars 3 times a day dx code e11.65 100 each 12   JARDIANCE 10 MG TABS tablet TAKE 1 TABLET(10 MG) BY MOUTH DAILY BEFORE BREAKFAST 90 tablet 1   lidocaine (XYLOCAINE) 2 % solution Use as directed 15 mLs in the mouth or throat as needed for mouth pain. 100 mL 0   Magnesium 250 MG TABS Take 1 tablet (250 mg total) by mouth every evening. With evening meals 30 tablet 2   modafinil (PROVIGIL) 200 MG tablet TAKE 1 TABLET(200 MG) BY MOUTH DAILY 30 tablet 5   Multiple Vitamins-Minerals (MULTIVITAMIN WITH MINERALS) tablet Take 1 tablet by mouth daily.     naproxen (NAPROSYN) 375 MG tablet Take 1 tablet (375 mg total) by mouth 2 (two) times daily with a meal. 30 tablet 0   olmesartan-hydrochlorothiazide (BENICAR HCT) 40-25 MG tablet Take 1 tablet by mouth daily. 90 tablet 2   Omega-3 Fatty Acids (FISH OIL) 1000 MG CPDR Take by mouth. Take one tablet daily     omeprazole (PRILOSEC) 10 MG capsule TAKE 1 CAPSULE(10 MG) BY MOUTH DAILY 90 capsule 1   Semaglutide, 2 MG/DOSE, (OZEMPIC, 2 MG/DOSE,) 8 MG/3ML SOPN Inject 2 mg into the skin once a week. 3 mL 1   tamsulosin (FLOMAX) 0.4 MG CAPS capsule TAKE 1 CAPSULE BY MOUTH EVERY DAY 30 MINUTES AFTER THE SAME MEAL 90 capsule 3   tiZANidine (ZANAFLEX) 4 MG tablet Take 1 tablet (4 mg total) by mouth at bedtime. 30 tablet 0   UNABLE TO FIND CPAP for OSA (6cm-19cm)     No current facility-administered medications on file prior to visit.     LABS/IMAGING: No results found for this or any previous visit (from the past 48 hour(s)). No results found.  LIPID PANEL:    Component Value Date/Time   CHOL 223 (H) 05/06/2023 1148   TRIG 748 (HH) 05/06/2023 1148   HDL 24 (L) 05/06/2023 1148   CHOLHDL 9.3 (H) 05/06/2023 1148   LDLCALC 79 05/06/2023 1148    WEIGHTS: Wt Readings from Last 3 Encounters:  05/18/23 230 lb 8 oz (104.6 kg)  05/06/23 226 lb (102.5 kg)  05/06/23 226 lb 9.6 oz (102.8 kg)    VITALS: BP 95/61 (BP Location: Left Arm, Patient Position: Sitting, Cuff Size: Large)   Pulse 62   Ht 5\' 9"  (1.753 m)   Wt 230 lb 8 oz (104.6 kg)   SpO2 96%   BMI 34.04 kg/m   EXAM: Deferred  EKG: Deferred  ASSESSMENT: Mixed dyslipidemia with high triglycerides Progressive dyspnea on exertion and fatigue Left main and multivessel coronary artery calcification Aortic atherosclerosis 30-pack-year smoking history COPD Hearing impaired  PLAN: 1.   Mr. Wool has mixed dyslipidemia with high triglycerides.  This is likely genetic.  That being said, there could be significant dietary improvements made as well including reducing saturated fats and fried foods in his diet.  Unfortunately, CT scan showed multivessel and left main coronary artery calcification.  He has reported some progressive fatigue and shortness of breath with activities including lawnmowing.  This could be due to obstructive coronary artery disease or possibly COPD.  I have recommended cardiac PET scan to rule out obstructive coronary artery disease.  In addition he would benefit from additional triglyceride lowering.  I agree with Vascepa but he never had this medication filled, possibly because it may not have been prior authorized.  Will reach out for that prior authorization.  Continue his other current medications.  And plan follow-up with me in about 3 to 4 months.  Thanks again for the kind referral.  Chrystie Nose, MD, [facility]  [place]   [facility]  Medical Director of the Advanced Lipid Disorders &  Cardiovascular Risk Reduction Clinic Diplomate of the American Board of Clinical Lipidology Attending Cardiologist  Direct Dial: [phone]  Fax: [phone]  Website:  [email or web address] 05/18/2023, 3:56 PM

## 2023-05-19 NOTE — Telephone Encounter (Signed)
Pt's wife checking on refill request for modafinil (PROVIGIL) 200 MG tablet

## 2023-05-20 NOTE — Telephone Encounter (Signed)
Requested Prescriptions   Pending Prescriptions Disp Refills   modafinil (PROVIGIL) 200 MG tablet [Pharmacy Med Name: MODAFINIL 200MG  TABLETS] 30 tablet     Sig: TAKE 1 TABLET(200 MG) BY MOUTH DAILY   Last seen 10/27/22, next appt 11/03/23 Dispenses   Routing to provider to fill Dispensed Days Supply Quantity Provider Pharmacy  MODAFINIL 200MG  TABLETS 04/15/2023 30 30 each Butch Penny, NP [facility]  MODAFINIL 200MG  TABLETS 03/15/2023 30 30 each Butch Penny, NP [facility]  MODAFINIL 200MG  TABLETS 02/13/2023 30 30 each Butch Penny, NP [facility]  MODAFINIL 200MG  TABLETS 01/11/2023 30 30 each Butch Penny, NP [facility]  MODAFINIL 200MG  TABLETS 12/17/2022 30 30 each Butch Penny, NP [facility]  MODAFINIL 200MG  TABLETS 11/18/2022 30 30 each Butch Penny, NP [facility]  MODAFINIL 200MG  TABLETS 10/06/2022 30 30 each Butch Penny, NP [facility]  MODAFINIL 200MG  TABLETS 09/02/2022 30 30 each Butch Penny, NP [facility]  MODAFINIL 200MG  TABLETS 08/04/2022 30 30 each Butch Penny, NP [facility]  MODAFINIL 200MG  TABLETS 07/06/2022 30 30 each Butch Penny, NP [facility]  MODAFINIL 200MG  TABLETS 06/04/2022 30 30 each Butch Penny, NP [facility].Marland KitchenMarland Kitchen

## 2023-05-21 ENCOUNTER — Ambulatory Visit (INDEPENDENT_AMBULATORY_CARE_PROVIDER_SITE_OTHER): Payer: Medicare PPO

## 2023-05-21 ENCOUNTER — Encounter (HOSPITAL_COMMUNITY): Payer: Self-pay

## 2023-05-21 ENCOUNTER — Other Ambulatory Visit: Payer: Self-pay

## 2023-05-21 ENCOUNTER — Telehealth (HOSPITAL_COMMUNITY): Payer: Self-pay | Admitting: *Deleted

## 2023-05-21 ENCOUNTER — Telehealth: Payer: Self-pay

## 2023-05-21 ENCOUNTER — Telehealth: Payer: Self-pay | Admitting: Adult Health

## 2023-05-21 VITALS — BP 120/70 | HR 85 | Temp 98.5°F | Ht 69.0 in | Wt 230.0 lb

## 2023-05-21 DIAGNOSIS — G4710 Hypersomnia, unspecified: Secondary | ICD-10-CM

## 2023-05-21 DIAGNOSIS — Z23 Encounter for immunization: Secondary | ICD-10-CM

## 2023-05-21 MED ORDER — MODAFINIL 200 MG PO TABS
200.0000 mg | ORAL_TABLET | Freq: Every day | ORAL | 5 refills | Status: DC
Start: 2023-05-21 — End: 2023-11-22

## 2023-05-21 NOTE — Telephone Encounter (Signed)
Rx should got to [facility] on [address] based on below message. You sent it today to [facility] on [address]   I have Rx attached to correct pharmacy now.

## 2023-05-21 NOTE — Telephone Encounter (Signed)
-----   Message from [employer] sent at 05/20/2023  3:26 PM EDT ----- Ozempic 2 mg for Novo, Farxiga 10 mg for AZ. Thanks! ----- Message ----- From: Earnest Conroy, CPhT Sent: 05/20/2023   3:23 PM EDT To: Alden Hipp, RPH-CPP  Weil Yes for novo.. what is the medicine? No for [employer] does not have online any more. I can mail the application.  What is the drug? ----- Message ----- From: Alden Hipp, RPH-CPP Sent: 05/20/2023   2:35 PM EDT To: Earnest Conroy, CPhT  I've definitely already shred it. Can you please start online for Novo and AZ?  Catie ----- Message ----- From: Earnest Conroy, CPhT Sent: 05/20/2023   2:28 PM EDT To: Alden Hipp, RPH-CPP  No we did not received it. ----- Message ----- From: Alden Hipp, RPH-CPP Sent: 05/19/2023   2:37 PM EDT To: Earnest Conroy, CPhT  Lina Sayre,   I did fax this in on 8/15. Was it not received?   Thanks! ----- Message ----- From: Earnest Conroy, CPhT Sent: 05/19/2023  10:43 AM EDT To: Alden Hipp, RPH-CPP  Are you faxing the pat and provider portion to [place] to send to company. ----- Message ----- From: Alden Hipp, RPH-CPP Sent: 05/06/2023  12:06 PM EDT To: Rx Med Assistance Team  Received patient and provider portions in clinic, will fax in to y'all. Patient did not know his household income, but his 2023 Novo app should be in a folder in the box of folders I put in Rachael's office last week.   Thanks!  Catie

## 2023-05-21 NOTE — Telephone Encounter (Signed)
Patient's wife calling about her husband's upcoming cardiac imaging study; pt's wife verbalizes understanding of appt date/time, parking situation and where to check in, pre-test NPO status, and verified current allergies; name and call back number provided for further questions should they arise  Larey Brick RN Navigator Cardiac Imaging [facility] [phone] office [phone] cell  Patient's wife aware patient is to avoid caffeine 12 hours prior to his cardiac PET scan.

## 2023-05-21 NOTE — Telephone Encounter (Signed)
done

## 2023-05-21 NOTE — Telephone Encounter (Signed)
Pt's wife called stating that her husband is needing a refill on his modafinil (PROVIGIL) 200 MG tablet but their normal pharmacy does not have it but informed them that the [facility] on [address] has it. Pt would like to know if a Rx can be sent to that [facility] so that he can pick it up there. Please advise.

## 2023-05-21 NOTE — Telephone Encounter (Signed)
Pt's wife has called back to report that [id number] states a prescription needs to be sent directly yo them for the  modafinil (PROVIGIL) 200 MG tablet Since the other pharmacy does not have it available before Tuesday.

## 2023-05-21 NOTE — Progress Notes (Signed)
Patient presents today for 2nd shingles vaccine.  ?

## 2023-05-24 ENCOUNTER — Telehealth (HOSPITAL_COMMUNITY): Payer: Self-pay | Admitting: Cardiology

## 2023-05-24 NOTE — Telephone Encounter (Signed)
Outpatient service line: NM Stress test medication question:  Patient asking which medications to hold or take before his study tomorrow. I have advised him to hold his carvedilol and he may continue the rest of his medications.

## 2023-05-25 ENCOUNTER — Ambulatory Visit (HOSPITAL_COMMUNITY)
Admission: RE | Admit: 2023-05-25 | Discharge: 2023-05-25 | Disposition: A | Discharge status: Home / Self Care | Payer: Medicare PPO | Source: Ambulatory Visit | Attending: Internal Medicine | Admitting: Internal Medicine

## 2023-05-25 ENCOUNTER — Encounter: Payer: Self-pay | Admitting: Nurse Practitioner

## 2023-05-25 DIAGNOSIS — I700 Atherosclerosis of aorta: Secondary | ICD-10-CM | POA: Diagnosis not present

## 2023-05-25 DIAGNOSIS — I2584 Coronary atherosclerosis due to calcified coronary lesion: Secondary | ICD-10-CM | POA: Diagnosis not present

## 2023-05-25 DIAGNOSIS — R0602 Shortness of breath: Secondary | ICD-10-CM | POA: Diagnosis not present

## 2023-05-25 DIAGNOSIS — R5383 Other fatigue: Secondary | ICD-10-CM | POA: Insufficient documentation

## 2023-05-25 DIAGNOSIS — I2510 Atherosclerotic heart disease of native coronary artery without angina pectoris: Secondary | ICD-10-CM | POA: Diagnosis not present

## 2023-05-25 LAB — NM PET CT CARDIAC PERFUSION MULTI W/ABSOLUTE BLOODFLOW
LV dias vol: 113 mL (ref 62–150)
LV sys vol: 60 mL
MBFR: 1.98
Nuc Rest EF: 47 %
Nuc Stress EF: 57 %
Rest MBF: 0.85 ml/g/min
Rest Nuclear Isotope Dose: 27 mCi
ST Depression (mm): 0 mm
Stress MBF: 1.68 ml/g/min
Stress Nuclear Isotope Dose: 27 mCi

## 2023-05-25 MED ORDER — REGADENOSON 0.4 MG/5ML IV SOLN
INTRAVENOUS | Status: AC
  Filled 2023-05-25: qty 5

## 2023-05-25 MED ORDER — RUBIDIUM RB82 GENERATOR (RUBYFILL)
27.0400 | PACK | Freq: Once | INTRAVENOUS | Status: AC
  Administered 2023-05-25: 27.04 via INTRAVENOUS

## 2023-05-25 MED ORDER — REGADENOSON 0.4 MG/5ML IV SOLN
0.4000 mg | Freq: Once | INTRAVENOUS | Status: AC
  Administered 2023-05-25: 0.4 mg via INTRAVENOUS

## 2023-05-25 MED ORDER — RUBIDIUM RB82 GENERATOR (RUBYFILL)
27.0100 | PACK | Freq: Once | INTRAVENOUS | Status: AC
  Administered 2023-05-25: 27.01 via INTRAVENOUS

## 2023-05-27 ENCOUNTER — Other Ambulatory Visit (HOSPITAL_COMMUNITY): Payer: Self-pay | Admitting: *Deleted

## 2023-05-27 ENCOUNTER — Encounter (HOSPITAL_BASED_OUTPATIENT_CLINIC_OR_DEPARTMENT_OTHER): Payer: Self-pay | Admitting: Internal Medicine

## 2023-05-31 NOTE — Telephone Encounter (Signed)
Contacted the patient today and explained findings to him and wife with assistance of a sign language interpreter.  Dr. Rennis Golden

## 2023-06-02 ENCOUNTER — Inpatient Hospital Stay: Admission: RE | Admit: 2023-06-02 | Payer: Medicare PPO | Source: Ambulatory Visit

## 2023-06-04 ENCOUNTER — Ambulatory Visit
Admission: RE | Admit: 2023-06-04 | Discharge: 2023-06-04 | Disposition: A | Discharge status: Home / Self Care | Payer: Medicare PPO | Source: Ambulatory Visit | Attending: Physical Medicine and Rehabilitation | Admitting: Physical Medicine and Rehabilitation

## 2023-06-04 DIAGNOSIS — I700 Atherosclerosis of aorta: Secondary | ICD-10-CM | POA: Diagnosis not present

## 2023-06-04 DIAGNOSIS — N2889 Other specified disorders of kidney and ureter: Secondary | ICD-10-CM | POA: Diagnosis not present

## 2023-06-04 DIAGNOSIS — K7460 Unspecified cirrhosis of liver: Secondary | ICD-10-CM | POA: Diagnosis not present

## 2023-06-04 MED ORDER — IOPAMIDOL (ISOVUE-300) INJECTION 61%
200.0000 mL | Freq: Once | INTRAVENOUS | Status: AC | PRN
  Administered 2023-06-04: 100 mL via INTRAVENOUS

## 2023-06-04 NOTE — Telephone Encounter (Signed)
Rec'd patients portion of [employer] app with proof of income. Faxing provider page to PCP.

## 2023-06-07 ENCOUNTER — Telehealth: Payer: Self-pay | Admitting: Adult Health

## 2023-06-07 NOTE — Telephone Encounter (Signed)
We only have data to review through 10/26/22.

## 2023-06-07 NOTE — Telephone Encounter (Signed)
Pt's wife has called back to report there is a message stating motor life has exceeded life expectancy, she said she was told to relay that the settings on CPAP are on auto  CPAP pressure 6-19. Please call pt's wife to address.

## 2023-06-07 NOTE — Telephone Encounter (Signed)
Pt's wife called and stated that the pt's Cpap machine is not working well. I advised wife to call the DME to report the issue.

## 2023-06-08 ENCOUNTER — Other Ambulatory Visit: Payer: Self-pay | Admitting: Nurse Practitioner

## 2023-06-08 ENCOUNTER — Encounter: Payer: Self-pay | Admitting: Nurse Practitioner

## 2023-06-08 DIAGNOSIS — M5451 Vertebrogenic low back pain: Secondary | ICD-10-CM | POA: Diagnosis not present

## 2023-06-08 NOTE — Telephone Encounter (Signed)
Received notification from [employer] regarding approval for OZEMPIC. Patient assistance approved from 06/04/23 to 05/28/24.  Medication will ship to providers office in 10-14 business days  Phone: [phone]

## 2023-06-08 NOTE — Telephone Encounter (Signed)
I called the patient's wife back.  I got him scheduled for this Thursday September 19th at 9:30 AM arrival 9 am.  She was advised to bring CPAP machine and power cord.  Patient will need sign language interpreter for the appointment.  I have called the interpreter department and they have made a note.

## 2023-06-08 NOTE — Telephone Encounter (Signed)
Submitted application for FARXIGA to [employer] for patient assistance.   Phone: [phone]

## 2023-06-09 ENCOUNTER — Encounter: Payer: Self-pay | Admitting: *Deleted

## 2023-06-09 ENCOUNTER — Other Ambulatory Visit: Payer: Self-pay | Admitting: Pharmacist

## 2023-06-09 NOTE — Progress Notes (Unsigned)
PATIENT: Stephen Chase DOB: August 29, 1966  REASON FOR VISIT: follow up HISTORY FROM: patient, sign language interpreter present  HISTORY OF PRESENT ILLNESS: Today 06/09/23:  10/27/22: Stephen Chase is a 57 y.o. male with a history of OSA on CPAP. Returns today for follow-up.  Reports that CPAP is working well for him.  Denies any new issues.  He states that he uses Provigil daily.  He states that it works well.  However he is noticing that sometimes he is having a hard time falling asleep at night.  His download is below     10/09/21: Stephen Chase is a 57 year old male with a history of obstructive sleep apnea on CPAP.  He returns today for follow-up.  He reports that CPAP works well for him.  He denies any new issues.  He returns today for follow-up.  10/09/20: Stephen Chase is a 57 year old male with a history of obstructive sleep apnea on CPAP.  Patient reports that he tolerates the CPAP well.  He does find it beneficial.  Denies any new issues.  His CPAP report is indicates:  Compliance Report Usage 09/09/2020 - 10/08/2020 Usage days 30/30 days (100%) >= 4 hours 27 days (90%) Average usage (days used) 5 hours 50 minutes  AirSense 10 AutoSet Serial number [id number] Mode AutoSet Min Pressure 6 cmH2O Max Pressure 19 cmH2O EPR Fulltime EPR level 3  Therapy Pressure - cmH2O Median: 10.0 95th percentile: 14.5 Maximum: 16.3 Leaks - L/min Median: 0.0 95th percentile: 2.1 Maximum: 32.8 Events per hour AI: 1.8 HI: 0.9 AHI: 2.7 Apnea Index Central: 0.2 Obstructive: 1.6 Unknown: 0.0   10/04/19: Stephen Chase is a 57 year old male with a history of obstructive sleep apnea on CPAP.  His download indicates that he uses machine 29 out of 30 days for compliance of 97%.  He uses machine greater than 4 hours 25 days for compliance of 83%.  On average he uses his machine 5 hours and 59 minutes.  His residual AHI is 2.7 on 6 to 19 cm of water with EPR of 3.  His leak in the 95th percentile is 6.6 L/min.  He  states that he continues to take modafinil.  He reports that this offers him good benefit.  He states that he was confused about Flomax and was taking it during the day and this was causing daytime sleepiness.  Now that he is taking it at bedtime his sleepiness has improved.  He returns today for an evaluation.  HISTORY (Copied from Dr.Dohmeier's note) Stephen Chase is a 57 y.o. male , seen here as in a referral  from PA [name] for a re- evaluation of sleep apnea,in the presence of his sign Presenter, broadcasting.  09-28-2018, follow up on CPAP titration and compliance.  Stephen Chase was evaluated by a home sleep study dated 08 June 2018 by watch pat device.  The total AHI was 16.6/h there was mild to moderate snoring noticed, oxygen nadir was 89%, average heart rate was 56 bpm.  The patient had already been on CPAP before and we continued CPAP treatment with an auto titration device.  I have the opportunity to see the excellent compliance the patient has used the machine 30 out of 30 days and 26 of those days over 4 hours.  Average use of time is 5 hours 58 minutes each night the AutoSet has a pressure window between 6 cmH2O and 19 cm water pressure with 3 cm expiratory pressure relief.  The 95th percentile pressure is 16  cmH2O with an AHI of 6.2 this seems to be related to unknown events at 2.5/h and I am not quite sure where they come from.   He does not have central apneas and he has very minimal air leakage.  I noticed that only 3 out of 30 nights had a very high AHI and this may have been a artifactual count the general average AHI is truly under 2/h.    REVIEW OF SYSTEMS: Out of a complete 14 system review of symptoms, the patient complains only of the following symptoms, and all other reviewed systems are negative.  Epworth sleepiness score 10   ALLERGIES: No Known Allergies  HOME MEDICATIONS: Outpatient Medications Prior to Visit  Medication Sig Dispense Refill   Accu-Chek Softclix  Lancets lancets Use as instructed to test blood sugars 3 times a day dx code e11.65 200 each 3   aspirin EC 81 MG tablet Take 81 mg by mouth daily. Swallow whole.     atorvastatin (LIPITOR) 40 MG tablet TAKE 1 TABLET(40 MG) BY MOUTH AT BEDTIME     Blood Glucose Monitoring Suppl (ACCU-CHEK GUIDE) w/Device KIT Use to check blood sugars three times daily E11.9 1 kit 0   carvedilol (COREG) 6.25 MG tablet TAKE 1 TABLET(6.25 MG) BY MOUTH TWICE DAILY WITH A MEAL 180 tablet 3   cholecalciferol (VITAMIN D3) 25 MCG (1000 UNIT) tablet Take 1,000 Units by mouth daily.     Cyanocobalamin (B-12 PO) Take by mouth daily.     cyclobenzaprine (FLEXERIL) 10 MG tablet Take 1 tablet (10 mg total) by mouth 3 (three) times daily as needed for muscle spasms. 30 tablet 0   dapagliflozin propanediol (FARXIGA) 10 MG TABS tablet Take 1 tablet (10 mg total) by mouth daily before breakfast. 90 tablet 1   fenofibrate (TRICOR) 145 MG tablet TAKE 1 TABLET(145 MG) BY MOUTH DAILY 90 tablet 1   glucose blood (ACCU-CHEK GUIDE) test strip CHECK BLOOD SUGAR 3 TIMES A DAY BEFORE BREAKFAST, LUNCH, AND DINNER 150 strip 2   glucose blood test strip Use as instructed to test blood sugars 3 times a day dx code e11.65 100 each 12   icosapent Ethyl (VASCEPA) 1 g capsule One bid for elevated triglycerides 180 capsule 3   JARDIANCE 10 MG TABS tablet TAKE 1 TABLET(10 MG) BY MOUTH DAILY BEFORE BREAKFAST 90 tablet 1   lidocaine (XYLOCAINE) 2 % solution Use as directed 15 mLs in the mouth or throat as needed for mouth pain. 100 mL 0   Magnesium 250 MG TABS Take 1 tablet (250 mg total) by mouth every evening. With evening meals 30 tablet 2   modafinil (PROVIGIL) 200 MG tablet Take 1 tablet (200 mg total) by mouth daily. 30 tablet 5   Multiple Vitamins-Minerals (MULTIVITAMIN WITH MINERALS) tablet Take 1 tablet by mouth daily.     naproxen (NAPROSYN) 375 MG tablet Take 1 tablet (375 mg total) by mouth 2 (two) times daily with a meal. 30 tablet 0    olmesartan-hydrochlorothiazide (BENICAR HCT) 40-25 MG tablet Take 1 tablet by mouth daily. 90 tablet 2   Omega-3 Fatty Acids (FISH OIL) 1000 MG CPDR Take by mouth. Take one tablet daily     omeprazole (PRILOSEC) 10 MG capsule TAKE 1 CAPSULE(10 MG) BY MOUTH DAILY 90 capsule 1   Semaglutide, 2 MG/DOSE, (OZEMPIC, 2 MG/DOSE,) 8 MG/3ML SOPN Inject 2 mg into the skin once a week. 3 mL 1   tamsulosin (FLOMAX) 0.4 MG CAPS capsule TAKE 1 CAPSULE BY  MOUTH EVERY DAY 30 MINUTES AFTER THE SAME MEAL 90 capsule 3   tiZANidine (ZANAFLEX) 4 MG tablet Take 1 tablet (4 mg total) by mouth at bedtime. 30 tablet 0   UNABLE TO FIND CPAP for OSA (6cm-19cm)     No facility-administered medications prior to visit.    PAST MEDICAL HISTORY: Past Medical History:  Diagnosis Date   Allergy    Arthritis    SHOULDERS   Balanitis    Deafness, sensorineural, childhood onset    BILATERAL SINCE AGE 35  SECONDARY TO UNKNOWN ILLNESS   GERD (gastroesophageal reflux disease)    Hyperlipidemia    Hypertension    No blood products    Patient is Jehovah Witness no blood products.   OSA on CPAP    SEVERE PER STUDY 03-09-2009   Sleep apnea    uses a cpap   Smokers' cough (HCC)    Type 2 diabetes mellitus (HCC)     PAST SURGICAL HISTORY: Past Surgical History:  Procedure Laterality Date   CIRCUMCISION N/A 12/19/2013   Procedure: CIRCUMCISION ADULT;  Surgeon: Su Grand, MD;  Location: [facility];  Service: Urology;  Laterality: N/A;   SHOULDER SURGERY Right    SHOULDER SURGERY Left 10/15/2021   WRIST SURGERY Left     FAMILY HISTORY: Family History  Problem Relation Age of Onset   Cancer Mother        type unknown pt was 58 years old at that time   Liver disease Father    Healthy Brother    Cancer Other         alot of family members have cancer but doesnt know the type   Colon cancer Neg Hx    Esophageal cancer Neg Hx    Rectal cancer Neg Hx    Stomach cancer Neg Hx    Sleep apnea Neg Hx      SOCIAL HISTORY: Social History   Socioeconomic History   Marital status: Married    Spouse name: Not on file   Number of children: 4   Years of education: Not on file   Highest education level: Not on file  Occupational History   Occupation: unemployed  Tobacco Use   Smoking status: Former    Current packs/day: 1.00    Average packs/day: 1 pack/day for 30.0 years (30.0 ttl pk-yrs)    Types: Cigarettes   Smokeless tobacco: Never   Tobacco comments:    quit 4 years ago  Vaping Use   Vaping status: Never Used  Substance and Sexual Activity   Alcohol use: Not Currently    Comment: rarely   Drug use: No   Sexual activity: Yes  Other Topics Concern   Not on file  Social History Narrative   Not on file   Social Determinants of Health   Financial Resource Strain: Low Risk  (05/06/2023)   Overall Financial Resource Strain (CARDIA)    Difficulty of Paying Living Expenses: Not hard at all  Food Insecurity: No Food Insecurity (05/06/2023)   Hunger Vital Sign    Worried About Running Out of Food in the Last Year: Never true    Ran Out of Food in the Last Year: Never true  Transportation Needs: No Transportation Needs (05/06/2023)   PRAPARE - Administrator, Civil Service (Medical): No    Lack of Transportation (Non-Medical): No  Physical Activity: Insufficiently Active (05/06/2023)   Exercise Vital Sign    Days of Exercise per Week: 5  days    Minutes of Exercise per Session: 20 min  Stress: No Stress Concern Present (05/06/2023)   [employer] of Occupational Health - Occupational Stress Questionnaire    Feeling of Stress : Not at all  Social Connections: Moderately Integrated (05/06/2023)   Social Connection and Isolation Panel [NHANES]    Frequency of Communication with Friends and Family: More than three times a week    Frequency of Social Gatherings with Friends and Family: More than three times a week    Attends Religious Services: More than 4 times per  year    Active Member of [employer] or Organizations: No    Attends Banker Meetings: Never    Marital Status: Married  Catering manager Violence: Not At Risk (05/06/2023)   Humiliation, Afraid, Rape, and Kick questionnaire    Fear of Current or Ex-Partner: No    Emotionally Abused: No    Physically Abused: No    Sexually Abused: No      PHYSICAL EXAM  There were no vitals filed for this visit.   There is no height or weight on file to calculate BMI.  Generalized: Well developed, in no acute distress  Chest: Lungs clear to auscultation bilaterally  Neurological examination  Mentation: Alert oriented to time, place, history taking. Follows all commands speech and language fluent Cranial nerve II-XII: Extraocular movements were full, visual field were full on confrontational test Head turning and shoulder shrug  were normal and symmetric. Gait and station: Gait is normal.    DIAGNOSTIC DATA (LABS, IMAGING, TESTING) - I reviewed patient records, labs, notes, testing and imaging myself where available.  Lab Results  Component Value Date   WBC 5.3 07/15/2022   HGB 13.4 07/15/2022   HCT 40.0 07/15/2022   MCV 89.8 07/15/2022   PLT 166.0 07/15/2022      Component Value Date/Time   NA 137 05/06/2023 1148   K 5.0 05/06/2023 1148   CL 99 05/06/2023 1148   CO2 24 05/06/2023 1148   GLUCOSE 183 (H) 05/06/2023 1148   GLUCOSE 187 (H) 07/15/2022 1119   BUN 29 (H) 05/06/2023 1148   CREATININE 1.51 (H) 05/06/2023 1148   CALCIUM 10.1 05/06/2023 1148   PROT 7.2 10/26/2022 1017   ALBUMIN 4.8 10/26/2022 1017   AST 28 10/26/2022 1017   ALT 30 10/26/2022 1017   ALKPHOS 70 10/26/2022 1017   BILITOT 0.4 10/26/2022 1017   GFRNONAA 72 10/17/2020 1437   GFRAA 84 10/17/2020 1437   Lab Results  Component Value Date   CHOL 223 (H) 05/06/2023   HDL 24 (L) 05/06/2023   LDLCALC 79 05/06/2023   TRIG 748 (HH) 05/06/2023   CHOLHDL 9.3 (H) 05/06/2023   Lab Results  Component  Value Date   HGBA1C 8.5 (H) 05/06/2023   No results found for: "VITAMINB12" Lab Results  Component Value Date   TSH 2.110 10/26/2022      ASSESSMENT AND PLAN 57 y.o. year old male  has a past medical history of Allergy, Arthritis, Balanitis, Deafness, sensorineural, childhood onset, GERD (gastroesophageal reflux disease), Hyperlipidemia, Hypertension, No blood products, OSA on CPAP, Sleep apnea, Smokers' cough (HCC), and Type 2 diabetes mellitus (HCC). here with:  Obstructive sleep apnea on CPAP Hypersomnia  Good compliance Residual AHI is in normal range Encouraged patient to continue using CPAP nightly greater than 4 hours each night Continue Provigil 200 mg daily for hypersomnia-try reducing provigil to 100 mg to see if it helps with falling asleep. Follow-up in  1 year or sooner if needed      Butch Penny, MSN, NP-C 06/09/2023, 4:02 PM [facility] [address] [place], [place] [address] [phone]

## 2023-06-09 NOTE — Progress Notes (Signed)
Care Coordination Call  Received message from provider that she started Jardiance. Will collaborate with technician team to pursue Jardiance assistance.   [name], PharmD, BCACP, CPP Clinical Pharmacist [facility] [phone]

## 2023-06-10 ENCOUNTER — Encounter: Payer: Self-pay | Admitting: Adult Health

## 2023-06-10 ENCOUNTER — Ambulatory Visit: Payer: Medicare PPO | Admitting: Adult Health

## 2023-06-10 VITALS — BP 108/53 | HR 61 | Ht 69.0 in | Wt 228.0 lb

## 2023-06-10 DIAGNOSIS — G4733 Obstructive sleep apnea (adult) (pediatric): Secondary | ICD-10-CM | POA: Diagnosis not present

## 2023-06-10 NOTE — Patient Instructions (Signed)
Your Plan:  Continue using CPAP machine Repeat home sleep test Pending results we will order new machine.      Thank you for coming to see [place] at [facility]. I hope we have been able to provide you high quality care today.  You may receive a patient satisfaction survey over the next few weeks. We would appreciate your feedback and comments so that we may continue to improve ourselves and the health of our patients.

## 2023-06-10 NOTE — Progress Notes (Addendum)
PATIENT: Stephen Chase DOB: 09-30-1965  REASON FOR VISIT: follow up HISTORY FROM: patient, sign language interpreter present  Chief Complaint  Patient presents with   Follow-up    Pt in 9 with interpreter Pt states little air pressure coming from mask Pt states need a new CPAP machine  Start date 04/19/2014     HISTORY OF PRESENT ILLNESS: Today 06/10/23:  Stephen Chase is a 57 y.o. male with a history of OSA on CPAP. Returns today for follow-up.  Reports that CPAP is working well for him.  He would like a new machine.  His set up date for this machine was 2015.  His download is below        10/27/22: Stephen Chase is a 57 y.o. male with a history of OSA on CPAP. Returns today for follow-up.  Reports that CPAP is working well for him.  Denies any new issues.  He states that he uses Provigil daily.  He states that it works well.  However he is noticing that sometimes he is having a hard time falling asleep at night.  His download is below     10/09/21: Stephen Chase is a 58 year old male with a history of obstructive sleep apnea on CPAP.  He returns today for follow-up.  He reports that CPAP works well for him.  He denies any new issues.  He returns today for follow-up.  10/09/20: Stephen Chase is a 57 year old male with a history of obstructive sleep apnea on CPAP.  Patient reports that he tolerates the CPAP well.  He does find it beneficial.  Denies any new issues.  His CPAP report is indicates:  Compliance Report Usage 09/09/2020 - 10/08/2020 Usage days 30/30 days (100%) >= 4 hours 27 days (90%) Average usage (days used) 5 hours 50 minutes  AirSense 10 AutoSet Serial number [id number] Mode AutoSet Min Pressure 6 cmH2O Max Pressure 19 cmH2O EPR Fulltime EPR level 3  Therapy Pressure - cmH2O Median: 10.0 95th percentile: 14.5 Maximum: 16.3 Leaks - L/min Median: 0.0 95th percentile: 2.1 Maximum: 32.8 Events per hour AI: 1.8 HI: 0.9 AHI: 2.7 Apnea Index Central: 0.2 Obstructive: 1.6  Unknown: 0.0   10/04/19: Stephen Chase is a 57 year old male with a history of obstructive sleep apnea on CPAP.  His download indicates that he uses machine 29 out of 30 days for compliance of 97%.  He uses machine greater than 4 hours 25 days for compliance of 83%.  On average he uses his machine 5 hours and 59 minutes.  His residual AHI is 2.7 on 6 to 19 cm of water with EPR of 3.  His leak in the 95th percentile is 6.6 L/min.  He states that he continues to take modafinil.  He reports that this offers him good benefit.  He states that he was confused about Flomax and was taking it during the day and this was causing daytime sleepiness.  Now that he is taking it at bedtime his sleepiness has improved.  He returns today for an evaluation.  HISTORY (Copied from Dr.Dohmeier's note) Stephen Chase is a 57 y.o. male , seen here as in a referral  from PA [name] for a re- evaluation of sleep apnea,in the presence of his sign Presenter, broadcasting.  09-28-2018, follow up on CPAP titration and compliance.  Stephen Chase was evaluated by a home sleep study dated 08 June 2018 by watch pat device.  The total AHI was 16.6/h there was mild to moderate snoring noticed, oxygen  nadir was 89%, average heart rate was 56 bpm.  The patient had already been on CPAP before and we continued CPAP treatment with an auto titration device.  I have the opportunity to see the excellent compliance the patient has used the machine 30 out of 30 days and 26 of those days over 4 hours.  Average use of time is 5 hours 58 minutes each night the AutoSet has a pressure window between 6 cmH2O and 19 cm water pressure with 3 cm expiratory pressure relief.  The 95th percentile pressure is 16 cmH2O with an AHI of 6.2 this seems to be related to unknown events at 2.5/h and I am not quite sure where they come from.   He does not have central apneas and he has very minimal air leakage.  I noticed that only 3 out of 30 nights had a very high AHI and this  may have been a artifactual count the general average AHI is truly under 2/h.    REVIEW OF SYSTEMS: Out of a complete 14 system review of symptoms, the patient complains only of the following symptoms, and all other reviewed systems are negative.  Epworth sleepiness score 14 FSS 41  ALLERGIES: No Known Allergies  HOME MEDICATIONS: Outpatient Medications Prior to Visit  Medication Sig Dispense Refill   Accu-Chek Softclix Lancets lancets Use as instructed to test blood sugars 3 times a day dx code e11.65 200 each 3   aspirin EC 81 MG tablet Take 81 mg by mouth daily. Swallow whole.     atorvastatin (LIPITOR) 40 MG tablet TAKE 1 TABLET(40 MG) BY MOUTH AT BEDTIME     Blood Glucose Monitoring Suppl (ACCU-CHEK GUIDE) w/Device KIT Use to check blood sugars three times daily E11.9 1 kit 0   carvedilol (COREG) 6.25 MG tablet TAKE 1 TABLET(6.25 MG) BY MOUTH TWICE DAILY WITH A MEAL 180 tablet 3   cholecalciferol (VITAMIN D3) 25 MCG (1000 UNIT) tablet Take 1,000 Units by mouth daily.     Cyanocobalamin (B-12 PO) Take by mouth daily.     cyclobenzaprine (FLEXERIL) 10 MG tablet Take 1 tablet (10 mg total) by mouth 3 (three) times daily as needed for muscle spasms. 30 tablet 0   dapagliflozin propanediol (FARXIGA) 10 MG TABS tablet Take 1 tablet (10 mg total) by mouth daily before breakfast. 90 tablet 1   fenofibrate (TRICOR) 145 MG tablet TAKE 1 TABLET(145 MG) BY MOUTH DAILY 90 tablet 1   glucose blood (ACCU-CHEK GUIDE) test strip CHECK BLOOD SUGAR 3 TIMES A DAY BEFORE BREAKFAST, LUNCH, AND DINNER 150 strip 2   glucose blood test strip Use as instructed to test blood sugars 3 times a day dx code e11.65 100 each 12   icosapent Ethyl (VASCEPA) 1 g capsule One bid for elevated triglycerides 180 capsule 3   JARDIANCE 10 MG TABS tablet TAKE 1 TABLET(10 MG) BY MOUTH DAILY BEFORE BREAKFAST 90 tablet 1   lidocaine (XYLOCAINE) 2 % solution Use as directed 15 mLs in the mouth or throat as needed for mouth pain.  100 mL 0   Magnesium 250 MG TABS Take 1 tablet (250 mg total) by mouth every evening. With evening meals 30 tablet 2   modafinil (PROVIGIL) 200 MG tablet Take 1 tablet (200 mg total) by mouth daily. 30 tablet 5   Multiple Vitamins-Minerals (MULTIVITAMIN WITH MINERALS) tablet Take 1 tablet by mouth daily.     naproxen (NAPROSYN) 375 MG tablet Take 1 tablet (375 mg total) by mouth 2 (  two) times daily with a meal. 30 tablet 0   olmesartan-hydrochlorothiazide (BENICAR HCT) 40-25 MG tablet Take 1 tablet by mouth daily. 90 tablet 2   Omega-3 Fatty Acids (FISH OIL) 1000 MG CPDR Take by mouth. Take one tablet daily     omeprazole (PRILOSEC) 10 MG capsule TAKE 1 CAPSULE(10 MG) BY MOUTH DAILY 90 capsule 1   Semaglutide, 2 MG/DOSE, (OZEMPIC, 2 MG/DOSE,) 8 MG/3ML SOPN Inject 2 mg into the skin once a week. 3 mL 1   tamsulosin (FLOMAX) 0.4 MG CAPS capsule TAKE 1 CAPSULE BY MOUTH EVERY DAY 30 MINUTES AFTER THE SAME MEAL 90 capsule 3   tiZANidine (ZANAFLEX) 4 MG tablet Take 1 tablet (4 mg total) by mouth at bedtime. 30 tablet 0   UNABLE TO FIND CPAP for OSA (6cm-19cm)     No facility-administered medications prior to visit.    PAST MEDICAL HISTORY: Past Medical History:  Diagnosis Date   Allergy    Arthritis    SHOULDERS   Balanitis    Deafness, sensorineural, childhood onset    BILATERAL SINCE AGE 13  SECONDARY TO UNKNOWN ILLNESS   GERD (gastroesophageal reflux disease)    Hyperlipidemia    Hypertension    No blood products    Patient is Jehovah Witness no blood products.   OSA on CPAP    SEVERE PER STUDY 03-09-2009   Sleep apnea    uses a cpap   Smokers' cough (HCC)    Type 2 diabetes mellitus (HCC)     PAST SURGICAL HISTORY: Past Surgical History:  Procedure Laterality Date   CIRCUMCISION N/A 12/19/2013   Procedure: CIRCUMCISION ADULT;  Surgeon: Su Grand, MD;  Location: [facility];  Service: Urology;  Laterality: N/A;   SHOULDER SURGERY Right    SHOULDER SURGERY Left  10/15/2021   WRIST SURGERY Left     FAMILY HISTORY: Family History  Problem Relation Age of Onset   Cancer Mother        type unknown pt was 16 years old at that time   Liver disease Father    Healthy Brother    Cancer Other         alot of family members have cancer but doesnt know the type   Colon cancer Neg Hx    Esophageal cancer Neg Hx    Rectal cancer Neg Hx    Stomach cancer Neg Hx    Sleep apnea Neg Hx     SOCIAL HISTORY: Social History   Socioeconomic History   Marital status: Married    Spouse name: Not on file   Number of children: 4   Years of education: Not on file   Highest education level: Not on file  Occupational History   Occupation: unemployed  Tobacco Use   Smoking status: Former    Current packs/day: 1.00    Average packs/day: 1 pack/day for 30.0 years (30.0 ttl pk-yrs)    Types: Cigarettes   Smokeless tobacco: Never   Tobacco comments:    quit 4 years ago  Vaping Use   Vaping status: Never Used  Substance and Sexual Activity   Alcohol use: Not Currently    Comment: rarely   Drug use: No   Sexual activity: Yes  Other Topics Concern   Not on file  Social History Narrative   Not on file   Social Determinants of Health   Financial Resource Strain: Low Risk  (05/06/2023)   Overall Financial Resource Strain (CARDIA)  Difficulty of Paying Living Expenses: Not hard at all  Food Insecurity: No Food Insecurity (05/06/2023)   Hunger Vital Sign    Worried About Running Out of Food in the Last Year: Never true    Ran Out of Food in the Last Year: Never true  Transportation Needs: No Transportation Needs (05/06/2023)   PRAPARE - Administrator, Civil Service (Medical): No    Lack of Transportation (Non-Medical): No  Physical Activity: Insufficiently Active (05/06/2023)   Exercise Vital Sign    Days of Exercise per Week: 5 days    Minutes of Exercise per Session: 20 min  Stress: No Stress Concern Present (05/06/2023)   [employer] of Occupational Health - Occupational Stress Questionnaire    Feeling of Stress : Not at all  Social Connections: Moderately Integrated (05/06/2023)   Social Connection and Isolation Panel [NHANES]    Frequency of Communication with Friends and Family: More than three times a week    Frequency of Social Gatherings with Friends and Family: More than three times a week    Attends Religious Services: More than 4 times per year    Active Member of [employer] or Organizations: No    Attends Banker Meetings: Never    Marital Status: Married  Catering manager Violence: Not At Risk (05/06/2023)   Humiliation, Afraid, Rape, and Kick questionnaire    Fear of Current or Ex-Partner: No    Emotionally Abused: No    Physically Abused: No    Sexually Abused: No      PHYSICAL EXAM  Vitals:   06/10/23 0934  BP: (!) 108/53  Pulse: 61  Weight: 228 lb (103.4 kg)  Height: 5\' 9"  (1.753 m)     Body mass index is 33.67 kg/m.  Generalized: Well developed, in no acute distress  Chest: Lungs clear to auscultation bilaterally  Neurological examination  Mentation: Alert oriented to time, place, history taking. Follows all commands speech and language fluent Cranial nerve II-XII: Facial symmetry noted Gait and station: Gait is normal.    DIAGNOSTIC DATA (LABS, IMAGING, TESTING) - I reviewed patient records, labs, notes, testing and imaging myself where available.  Lab Results  Component Value Date   WBC 5.3 07/15/2022   HGB 13.4 07/15/2022   HCT 40.0 07/15/2022   MCV 89.8 07/15/2022   PLT 166.0 07/15/2022      Component Value Date/Time   NA 137 05/06/2023 1148   K 5.0 05/06/2023 1148   CL 99 05/06/2023 1148   CO2 24 05/06/2023 1148   GLUCOSE 183 (H) 05/06/2023 1148   GLUCOSE 187 (H) 07/15/2022 1119   BUN 29 (H) 05/06/2023 1148   CREATININE 1.51 (H) 05/06/2023 1148   CALCIUM 10.1 05/06/2023 1148   PROT 7.2 10/26/2022 1017   ALBUMIN 4.8 10/26/2022 1017   AST 28  10/26/2022 1017   ALT 30 10/26/2022 1017   ALKPHOS 70 10/26/2022 1017   BILITOT 0.4 10/26/2022 1017   GFRNONAA 72 10/17/2020 1437   GFRAA 84 10/17/2020 1437   Lab Results  Component Value Date   CHOL 223 (H) 05/06/2023   HDL 24 (L) 05/06/2023   LDLCALC 79 05/06/2023   TRIG 748 (HH) 05/06/2023   CHOLHDL 9.3 (H) 05/06/2023   Lab Results  Component Value Date   HGBA1C 8.5 (H) 05/06/2023   No results found for: "VITAMINB12" Lab Results  Component Value Date   TSH 2.110 10/26/2022      ASSESSMENT AND PLAN 57 y.o. year  old male  has a past medical history of Allergy, Arthritis, Balanitis, Deafness, sensorineural, childhood onset, GERD (gastroesophageal reflux disease), Hyperlipidemia, Hypertension, No blood products, OSA on CPAP, Sleep apnea, Smokers' cough (HCC), and Type 2 diabetes mellitus (HCC). here with:  Obstructive sleep apnea on CPAP Hypersomnia  Good compliance Residual AHI is in normal range Encouraged patient to continue using CPAP nightly greater than 4 hours each night Continue Provigil 200 mg daily for hypersomnia Repeat home sleep test pending results we will order new machine Follow-up after home sleep test      Butch Penny, MSN, NP-C 06/10/2023, 9:42 AM [facility] [address] [place], [place] [address] [phone]

## 2023-06-14 DIAGNOSIS — M47896 Other spondylosis, lumbar region: Secondary | ICD-10-CM | POA: Diagnosis not present

## 2023-06-15 ENCOUNTER — Telehealth: Payer: Self-pay

## 2023-06-15 NOTE — Telephone Encounter (Signed)
-----   Message from [employer] sent at 06/09/2023 10:46 AM EDT ----- Can we please mail Jardiance app? Thanks!

## 2023-06-15 NOTE — Telephone Encounter (Signed)
Mailing [employer] application to patients home for [place] assistance.

## 2023-06-29 ENCOUNTER — Ambulatory Visit: Payer: Self-pay

## 2023-06-29 NOTE — Patient Outreach (Signed)
Care Coordination   Follow Up Visit Note   06/29/2023 Name: Stephen Chase MRN: [id number] DOB: 17-Aug-1966  Stephen Chase is a 57 y.o. year old male who sees [name], FNP for primary care. I  spoke with the patients spouse by phone to assist with care coordination needs.  What matters to the patients health and wellness today?  Discussed patient has concerns with recent MRI results and will see Urologist on 10/9. Patient has mad diet and medication changes to better manage Cholesterol.     Goals Addressed             This Visit's Progress    COMPLETED: Care Coordination Activities       Care Coordination Interventions: Collaboration with PharmD Catie Feliz Beam who requests SW assistance with a Medicaid application. The patient must apply for Medicaid prior to moving forward with patient assistance application per drug company. Once the patient receives a Medicaid denial, pharmacy team can move forward with application for patient assistance Explained above to patients spouse who opted for a mailed application - application mailed to patients home Spouse reports patient has recently seen cardiologist and been diagnosed with high cholesterol. Patient is now taking Vascepa and has changed his diet to better manage cholesterol. Patient has been losing weight due to diet changes Spouse also reports the patient had been seen by the Orthopedic specialist related to back pain. Patient underwent an MRI which resulted in the appearance of an abdominal mass. The patient has since been referred to a Urologist Discussed the patient has an appointment to see Dr. Liliane Shi with [facility] on 10/9; SW encouraged the patient to request a detailed report of the appointment be sent to his primary care providers office Reviewed patients next scheduled appointment with his primary care provider is on 12/24 - spouse inquires if the patient needs to be seen sooner considering changes in medical conditions. Wife  also inquires if A1C will need to be checked sooner due to weight loss and blood sugar readings trending more towards 164 than 200 Collaboration with patients primary care provider to inform of above and request the patient be contacted to schedule a sooner appointment if needed Collaboration with PharmD to advise spouse has been mailed a Medicaid application and understands to turn this in to DSS. Spouse is to contact PharmD once a denial letter is received in order to move forward with patient assistance applications         SDOH assessments and interventions completed:  No     Care Coordination Interventions:  Yes, provided   Interventions Today    Flowsheet Row Most Recent Value  Chronic Disease   Chronic disease during today's visit Other  [High Cholesterol, possible abdominal mass, medication costs]  General Interventions   General Interventions Discussed/Reviewed General Interventions Discussed, Communication with, Doctor Visits, [employer] SW will mail the paitent a [place] application]  Doctor Visits Discussed/Reviewed Doctor Visits Discussed  [patient has an appt to see Dr. Liliane Shi with [facility] on 10/9]  Communication with PCP/Specialists  [advised of medical updates]        Follow up plan: No further intervention required. Encouraged patient and/or his spouse to contact SW as needed. The patient will continue to be followed by pharmacist to assist with patient assistance application.    Encounter Outcome:  Patient Visit Completed   Bevelyn Ngo, BSW, CDP [facility]  [facility], [facility] Social Worker Direct Dial: [phone]  Fax: [phone]

## 2023-06-29 NOTE — Telephone Encounter (Signed)
Contacted az&me to follow up on application status.   Received notification from AZ&ME regarding patient assistance DENIAL for FARXIGA.   Phone:[phone]  Pt should qualify for [place] medicaid. Pt will need to apply for medicaid. If denied, send the letter to az&me for reconsideration/appeal. If not eligible for medicaid (per state guidelines), az&me will accept an attestation letter from the office explaining why he isnt eligible along with a copy of the medicaid guidelines attached.

## 2023-06-29 NOTE — Patient Instructions (Signed)
Visit Information  Thank you for taking time to visit with me today. Please don't hesitate to contact me if I can be of assistance to you.   Following are the goals we discussed today:  - Attend Urology appointment on 10/9, request Dr. Liliane Shi route outcome of visit to [name] FNP - Complete Medicaid application and turn it into DSS. I am mailing you an application. You may also complete this online at [email or web address] - Notify Catie Feliz Beam, pharmacist once you determine if you are approved or denied for Medicaid in order to move forward with patient assistance application - Contact your primary care provider as needed  If you are experiencing a Mental Health or Behavioral Health Crisis or need someone to talk to, please call 1-800-273-TALK (toll free, 24 hour hotline) go to [facility] [address] [place] [phone]) call 911  Patient verbalizes understanding of instructions and care plan provided today and agrees to view in MyChart. Active MyChart status and patient understanding of how to access instructions and care plan via MyChart confirmed with patient.     No further follow up required: Please contact me as needed.  Bevelyn Ngo, BSW, CDP [facility]  [facility], [facility] Social Worker Direct Dial: [phone]  Fax: [phone]

## 2023-06-30 DIAGNOSIS — R1901 Right upper quadrant abdominal swelling, mass and lump: Secondary | ICD-10-CM | POA: Diagnosis not present

## 2023-06-30 DIAGNOSIS — D49511 Neoplasm of unspecified behavior of right kidney: Secondary | ICD-10-CM | POA: Diagnosis not present

## 2023-06-30 DIAGNOSIS — C641 Malignant neoplasm of right kidney, except renal pelvis: Secondary | ICD-10-CM | POA: Diagnosis not present

## 2023-06-30 DIAGNOSIS — I700 Atherosclerosis of aorta: Secondary | ICD-10-CM | POA: Diagnosis not present

## 2023-07-02 ENCOUNTER — Telehealth: Payer: Self-pay | Admitting: *Deleted

## 2023-07-02 ENCOUNTER — Other Ambulatory Visit: Payer: Self-pay | Admitting: Urology

## 2023-07-02 NOTE — Telephone Encounter (Signed)
OK to proceed with urologic surgery without further cardiac testing  Dr Rennis Golden

## 2023-07-02 NOTE — Telephone Encounter (Signed)
Patient Name: Stephen Chase  DOB: Aug 19, 1966 MRN: [id number]  Primary Cardiologist: None  Chart reviewed as part of pre-operative protocol coverage. Given past medical history and time since last visit, based on ACC/AHA guidelines, [name] is at acceptable risk for the planned procedure without further cardiovascular testing.  Patient was last seen in office on 05/18/2023 by Dr. Rennis Golden.  Recent stress test was normal.    Per Dr. Rennis Golden, "OK to proceed with urologic surgery without further cardiac testing   Dr Rennis Golden"  Per office protocol, he may hold Aspirin for 5-7 days prior to procedure. Please resume Aspirin as soon as possible postprocedure, at the discretion of the surgeon.   I will route this recommendation to the requesting party via Epic fax function and remove from pre-op pool.  Please call with questions.  Joylene Grapes, NP 07/02/2023, 3:12 PM

## 2023-07-02 NOTE — Telephone Encounter (Signed)
Pre-operative Risk Assessment    Patient Name: Stephen Chase  DOB: 1965-12-02 MRN: [id number]      Request for Surgical Clearance    Procedure:   Robotic Right Partial Nephrectomy  Date of Surgery:  Clearance 09/07/23                                 Surgeon:  Dr. Rhoderick Moody Surgeon's Group or Practice Name:  [facility] Phone number:  [phone] Fax number:  [phone]   Type of Clearance Requested:   - Medical  - Pharmacy:  Hold Aspirin 5 days prior   Type of Anesthesia:  Not Indicated   Additional requests/questions:    Signed, Emmit Pomfret   07/02/2023, 10:26 AM

## 2023-07-06 NOTE — Telephone Encounter (Signed)
Rec'd completed patient pages of application.   Faxing provider pages to pcp

## 2023-07-13 NOTE — Telephone Encounter (Signed)
Faxed pcp pg.

## 2023-07-22 ENCOUNTER — Encounter: Payer: Self-pay | Admitting: Nurse Practitioner

## 2023-07-28 ENCOUNTER — Ambulatory Visit (INDEPENDENT_AMBULATORY_CARE_PROVIDER_SITE_OTHER): Payer: Medicare PPO | Admitting: Neurology

## 2023-07-28 DIAGNOSIS — G4733 Obstructive sleep apnea (adult) (pediatric): Secondary | ICD-10-CM

## 2023-07-29 NOTE — Progress Notes (Signed)
Stephen Chase Male, 57 y.o., 1966-05-17 MRN: [id number]   [facility]   HOME SLEEP TEST REPORT ( by Watch PAT)   STUDY DATE: 07-29-2023    ORDERING CLINICIAN: Butch Penny, NP  REFERRING CLINICIAN: [name], NP   CLINICAL INFORMATION/HISTORY: 06-10-2023;sign language interpreter present during the visit with MM, NP.  Stephen Chase is a 57 y.o. male with a history of OSA on CPAP. Returns today for follow-up.  Reports that CPAP is working well for him.  He would like a new machine.  His set up date for this machine was 2015.    Auto set 6-19cm water, 3 cm EPR, AHI 4.7/h  95% pressure was 15.5 cm water.  His last home sleep study was dated 08 June 2018 by watch pat device.  The total AHI was 16.6/h there was mild to moderate snoring noticed, oxygen nadir was 89%, average heart rate was 56 bpm.  The patient had already been on CPAP before and we continued CPAP treatment with an auto titration device.  I have the opportunity to see the excellent compliance the patient has used the machine 30 out of 30 days and 26 of those days over 4 hours.  Average use of time is 5 hours 58 minutes each night the AutoSet has a pressure window between 6 cmH2O and 19 cm water pressure with 3 cm expiratory pressure relief.  The 95th percentile pressure is 16 cmH2O with an AHI of 6.2        Epworth sleepiness score: 14/24. FSS 41/63 points.    BMI:  33.7 kg/m   Neck Circumference: na   FINDINGS:   Sleep Summary:   Total Recording Time (hours, min):    7 hours 45 minutes   Total Sleep Time (hours, min):     6 hours 7 minutes            Percent REM (%):   18.2%                                     Respiratory Indices following AASM criteria:   Calculated pAHI (per hour):    44.6/h                         REM pAHI:       38.8/h                                        NREM pAHI:     45.9/h                       Positional AHI:   This patient slept 191 minutes on his left side  with an AHI of 14.2/h versus 163 minutes supine with an AHI of 84/h . Snoring was present for over 40% of total sleep time and reached a mean volume of 41 dB.                                               Oxygen Saturation Statistics:    O2 Saturation Range (%): Between a nadir at 83 and a maximum saturation  of 99% with a mean saturation of 94%                                     O2 Saturation (minutes) <89%:   2.5 minutes        Pulse Rate Statistics:   Pulse Mean (bpm): 69 bpm               Pulse Range: Between 50 and 108 bpm.                IMPRESSION:  This HST confirms the presence of severe sleep apnea, all obstructive. The overall AHI was 44.6 and slightly higher in non-REM sleep.  There was a strong positional component noted as supine  apnea hypopnea index exceeded lateral positional AHI more than 5 times.     RECOMMENDATION: This patient should avoid sleeping in supine. A continuation of CPAP therapy is needed and a new auto titration device by [employer] will be ordered again set between 6 and 19 cm water pressure with 3 cm EPR, with heated humidification and a mask the patient feels comfortable with.  Follow-up with nurse practitioner Everlene Other within 90 days of therapy on the new device.    INTERPRETING PHYSICIAN:   Melvyn Novas, MD

## 2023-08-01 DIAGNOSIS — G4733 Obstructive sleep apnea (adult) (pediatric): Secondary | ICD-10-CM | POA: Insufficient documentation

## 2023-08-01 NOTE — Procedures (Signed)
Stephen Chase Male, 57 y.o., 1966-05-17 MRN: [id number]   [facility]   HOME SLEEP TEST REPORT ( by Watch PAT)   STUDY DATE: 07-29-2023    ORDERING CLINICIAN: Butch Penny, NP  REFERRING CLINICIAN: [name], NP   CLINICAL INFORMATION/HISTORY: 06-10-2023;sign language interpreter present during the visit with MM, NP.  Stephen Chase is a 57 y.o. male with a history of OSA on CPAP. Returns today for follow-up.  Reports that CPAP is working well for him.  He would like a new machine.  His set up date for this machine was 2015.    Auto set 6-19cm water, 3 cm EPR, AHI 4.7/h  95% pressure was 15.5 cm water.  His last home sleep study was dated 08 June 2018 by watch pat device.  The total AHI was 16.6/h there was mild to moderate snoring noticed, oxygen nadir was 89%, average heart rate was 56 bpm.  The patient had already been on CPAP before and we continued CPAP treatment with an auto titration device.  I have the opportunity to see the excellent compliance the patient has used the machine 30 out of 30 days and 26 of those days over 4 hours.  Average use of time is 5 hours 58 minutes each night the AutoSet has a pressure window between 6 cmH2O and 19 cm water pressure with 3 cm expiratory pressure relief.  The 95th percentile pressure is 16 cmH2O with an AHI of 6.2        Epworth sleepiness score: 14/24. FSS 41/63 points.    BMI:  33.7 kg/m   Neck Circumference: na   FINDINGS:   Sleep Summary:   Total Recording Time (hours, min):    7 hours 45 minutes   Total Sleep Time (hours, min):     6 hours 7 minutes            Percent REM (%):   18.2%                                     Respiratory Indices following AASM criteria:   Calculated pAHI (per hour):    44.6/h                         REM pAHI:       38.8/h                                        NREM pAHI:     45.9/h                       Positional AHI:   This patient slept 191 minutes on his left side  with an AHI of 14.2/h versus 163 minutes supine with an AHI of 84/h . Snoring was present for over 40% of total sleep time and reached a mean volume of 41 dB.                                               Oxygen Saturation Statistics:    O2 Saturation Range (%): Between a nadir at 83 and a maximum saturation  of 99% with a mean saturation of 94%                                     O2 Saturation (minutes) <89%:   2.5 minutes        Pulse Rate Statistics:   Pulse Mean (bpm): 69 bpm               Pulse Range: Between 50 and 108 bpm.                IMPRESSION:  This HST confirms the presence of severe sleep apnea, all obstructive. The overall AHI was 44.6 and slightly higher in non-REM sleep.  There was a strong positional component noted as supine  apnea hypopnea index exceeded lateral positional AHI more than 5 times.     RECOMMENDATION: This patient should avoid sleeping in supine. A continuation of CPAP therapy is needed and a new auto titration device by [employer] will be ordered again set between 6 and 19 cm water pressure with 3 cm EPR, with heated humidification and a mask the patient feels comfortable with.  Follow-up with nurse practitioner Everlene Other within 90 days of therapy on the new device.    INTERPRETING PHYSICIAN:   Melvyn Novas, MD

## 2023-08-02 NOTE — Addendum Note (Signed)
Addended by: Enedina Finner on: 08/02/2023 09:49 AM   Modules accepted: Orders

## 2023-08-03 ENCOUNTER — Telehealth: Payer: Self-pay | Admitting: *Deleted

## 2023-08-03 NOTE — Telephone Encounter (Addendum)
Pt viewed results Megan,NP sent on mychart .Sent mychart message for pt to be aware orders for  new pap machine was faxed to [facility] this Afternoon

## 2023-08-03 NOTE — Telephone Encounter (Signed)
-----   Message from Butch Penny sent at 08/02/2023  9:49 AM EST ----- Order placed for new machine

## 2023-08-06 ENCOUNTER — Telehealth: Payer: Self-pay | Admitting: Adult Health

## 2023-08-06 DIAGNOSIS — G4733 Obstructive sleep apnea (adult) (pediatric): Secondary | ICD-10-CM

## 2023-08-06 NOTE — Telephone Encounter (Signed)
Stephen Chase from [facility] called stating that the settings for the pt's Cpap machine are invalid and is needing to speak to an RN or MD so that it can be corrected. Please advise.

## 2023-08-09 NOTE — Addendum Note (Signed)
Addended by: Guy Begin on: 08/09/2023 01:32 PM   Modules accepted: Orders

## 2023-08-09 NOTE — Telephone Encounter (Signed)
Faxed over orders to [facility] this morning

## 2023-08-12 DIAGNOSIS — G4733 Obstructive sleep apnea (adult) (pediatric): Secondary | ICD-10-CM | POA: Diagnosis not present

## 2023-08-12 DIAGNOSIS — K209 Esophagitis, unspecified without bleeding: Secondary | ICD-10-CM | POA: Diagnosis not present

## 2023-08-25 NOTE — Progress Notes (Addendum)
Anesthesia Review:  PCP: Stephen Chase  LOV 08/12/23 Cardiologist : Stephen Chase  Telephone [place] on 07/02/23- Stephen Monge,NP clearance  Chest x-ray : EKG : 08/31/23  Echo : Stress test: 05/25/23  Cardiac Cath :  Activity level: can do a flight of stairs without difficutly  Sleep Study/ CPAP : has cpap  Fasting Blood Sugar :      / Checks Blood Sugar -- times a day:   Blood Thinner/ Instructions /Last Dose: ASA / Instructions/ Last Dose :    81 mg aspirin DM- type 2  Checks glucose once daily in am  Hgba1c- 08/31/23 - 7.2 - routed to DR Winter on 08/31/23.    Jardiance- Last dose on 09/03/2023  Ozempic- Last dose on 08/24/2023    DEAF and  so is wife .     Blood Refusal-  Consent on chart.   Consent form faxed to DR Winter along with Health Care Directive Blood Refusal Form faxed to [facility].   NOTed in FYI  Noted in Allergies  Order placed. In epic.     Deaf Interpreter present at preop appt.     BMP  done 08/31/23 faxed to DR christopher Winter on 08/31/23.

## 2023-08-25 NOTE — Patient Instructions (Addendum)
SURGICAL WAITING ROOM VISITATION  Patients having surgery or a procedure may have no more than 2 support people in the waiting area - these visitors may rotate.    Children under the age of 45 must have an adult with them who is not the patient.  Due to an increase in RSV and influenza rates and associated hospitalizations, children ages 27 and under may not visit patients in [facility] hospitals.  If the patient needs to stay at the hospital during part of their recovery, the visitor guidelines for inpatient rooms apply. Pre-op nurse will coordinate an appropriate time for 1 support person to accompany patient in pre-op.  This support person may not rotate.    Please refer to the [facility] website for the visitor guidelines for Inpatients (after your surgery is over and you are in a regular room).       Your procedure is scheduled on:  09/07/2023    Report to [facility] Main Entrance    Report to admitting at  0515 AM   Call this number if you have problems the morning of surgery [phone]   Do not eat food or drink liquids :After Midnight.   Water Non-Citrus Juices (without pulp, NO RED-Apple, White grape, White cranberry) Black Coffee (NO MILK/CREAM OR CREAMERS, sugar ok)  Clear Tea (NO MILK/CREAM OR CREAMERS, sugar ok) regular and decaf                             Plain Jell-O (NO RED)                                           Fruit ices (not with fruit pulp, NO RED)                                     Popsicles (NO RED)                                                               Sports drinks like Gatorade (NO RED)                            If you have questions, please contact your surgeon's office.       Oral Hygiene is also important to reduce your risk of infection.                                    Remember - BRUSH YOUR TEETH THE MORNING OF SURGERY WITH YOUR REGULAR TOOTHPASTE  DENTURES WILL BE REMOVED PRIOR TO SURGERY PLEASE DO NOT APPLY  "Poly grip" OR ADHESIVES!!!   Do NOT smoke after Midnight   Stop all vitamins and herbal supplements 7 days before surgery.   Take these medicines the morning of surgery with A SIP OF WATER: coreg, omeprazole             Farxiga- Hold for 72 hours prior to surgery  Jardiance- Hold for 72 hours prior to surgery.            Ozempic- Hold for 7 days prior to surgery.   DO NOT TAKE ANY ORAL DIABETIC MEDICATIONS DAY OF YOUR SURGERY  Bring CPAP mask and tubing day of surgery.                              You may not have any metal on your body including hair pins, jewelry, and body piercing             Do not wear make-up, lotions, powders, perfumes/cologne, or deodorant  Do not wear nail polish including gel and S&S, artificial/acrylic nails, or any other type of covering on natural nails including finger and toenails. If you have artificial nails, gel coating, etc. that needs to be removed by a nail salon please have this removed prior to surgery or surgery may need to be canceled/ delayed if the surgeon/ anesthesia feels like they are unable to be safely monitored.   Do not shave  48 hours prior to surgery.               Men may shave face and neck.   Do not bring valuables to the hospital. [facility] IS NOT             RESPONSIBLE   FOR VALUABLES.   Contacts, glasses, dentures or bridgework may not be worn into surgery.   Bring small overnight bag day of surgery.   DO NOT BRING YOUR HOME MEDICATIONS TO THE HOSPITAL. PHARMACY WILL DISPENSE MEDICATIONS LISTED ON YOUR MEDICATION LIST TO YOU DURING YOUR ADMISSION IN THE HOSPITAL!    Patients discharged on the day of surgery will not be allowed to drive home.  Someone NEEDS to stay with you for the first 24 hours after anesthesia.   Special Instructions: Bring a copy of your healthcare power of attorney and living will documents the day of surgery if you haven't scanned them before.              Please read over the  following fact sheets you were given: IF YOU HAVE QUESTIONS ABOUT YOUR PRE-OP INSTRUCTIONS PLEASE CALL [phone]   If you received a COVID test during your pre-op visit  it is requested that you wear a mask when out in public, stay away from anyone that may not be feeling well and notify your surgeon if you develop symptoms. If you test positive for Covid or have been in contact with anyone that has tested positive in the last 10 days please notify you surgeon.    [facility] - Preparing for Surgery Before surgery, you can play an important role.  Because skin is not sterile, your skin needs to be as free of germs as possible.  You can reduce the number of germs on your skin by washing with CHG (chlorahexidine gluconate) soap before surgery.  CHG is an antiseptic cleaner which kills germs and bonds with the skin to continue killing germs even after washing. Please DO NOT use if you have an allergy to CHG or antibacterial soaps.  If your skin becomes reddened/irritated stop using the CHG and inform your nurse when you arrive at Short Stay. Do not shave (including legs and underarms) for at least 48 hours prior to the first CHG shower.  You may shave your face/neck. Please follow these instructions carefully:  1.  Shower  with CHG Soap the night before surgery and the  morning of Surgery.  2.  If you choose to wash your hair, wash your hair first as usual with your  normal  shampoo.  3.  After you shampoo, rinse your hair and body thoroughly to remove the  shampoo.                           4.  Use CHG as you would any other liquid soap.  You can apply chg directly  to the skin and wash                       Gently with a scrungie or clean washcloth.  5.  Apply the CHG Soap to your body ONLY FROM THE NECK DOWN.   Do not use on face/ open                           Wound or open sores. Avoid contact with eyes, ears mouth and genitals (private parts).                       Wash face,  Genitals (private  parts) with your normal soap.             6.  Wash thoroughly, paying special attention to the area where your surgery  will be performed.  7.  Thoroughly rinse your body with warm water from the neck down.  8.  DO NOT shower/wash with your normal soap after using and rinsing off  the CHG Soap.                9.  Pat yourself dry with a clean towel.            10.  Wear clean pajamas.            11.  Place clean sheets on your bed the night of your first shower and do not  sleep with pets. Day of Surgery : Do not apply any lotions/deodorants the morning of surgery.  Please wear clean clothes to the hospital/surgery center.  FAILURE TO FOLLOW THESE INSTRUCTIONS MAY RESULT IN THE CANCELLATION OF YOUR SURGERY PATIENT SIGNATURE_________________________________  NURSE SIGNATURE__________________________________  ________________________________________________________________________

## 2023-08-26 ENCOUNTER — Other Ambulatory Visit: Payer: Self-pay | Admitting: Nurse Practitioner

## 2023-08-27 ENCOUNTER — Encounter: Payer: Self-pay | Admitting: Nurse Practitioner

## 2023-08-30 ENCOUNTER — Other Ambulatory Visit: Payer: Self-pay | Admitting: Nurse Practitioner

## 2023-08-31 ENCOUNTER — Other Ambulatory Visit: Payer: Self-pay

## 2023-08-31 ENCOUNTER — Encounter (HOSPITAL_COMMUNITY): Payer: Self-pay

## 2023-08-31 ENCOUNTER — Encounter: Payer: Self-pay | Admitting: Nurse Practitioner

## 2023-08-31 ENCOUNTER — Encounter (HOSPITAL_COMMUNITY)
Admission: RE | Admit: 2023-08-31 | Discharge: 2023-08-31 | Disposition: A | Discharge status: Home / Self Care | Payer: Medicare PPO | Source: Ambulatory Visit | Attending: Urology | Admitting: Urology

## 2023-08-31 VITALS — BP 110/61 | HR 78 | Temp 98.4°F | Resp 16 | Ht 69.5 in | Wt 219.0 lb

## 2023-08-31 DIAGNOSIS — I129 Hypertensive chronic kidney disease with stage 1 through stage 4 chronic kidney disease, or unspecified chronic kidney disease: Secondary | ICD-10-CM | POA: Insufficient documentation

## 2023-08-31 DIAGNOSIS — K7460 Unspecified cirrhosis of liver: Secondary | ICD-10-CM | POA: Insufficient documentation

## 2023-08-31 DIAGNOSIS — I2510 Atherosclerotic heart disease of native coronary artery without angina pectoris: Secondary | ICD-10-CM | POA: Diagnosis not present

## 2023-08-31 DIAGNOSIS — E1169 Type 2 diabetes mellitus with other specified complication: Secondary | ICD-10-CM | POA: Insufficient documentation

## 2023-08-31 DIAGNOSIS — I700 Atherosclerosis of aorta: Secondary | ICD-10-CM | POA: Insufficient documentation

## 2023-08-31 DIAGNOSIS — H9193 Unspecified hearing loss, bilateral: Secondary | ICD-10-CM | POA: Insufficient documentation

## 2023-08-31 DIAGNOSIS — K219 Gastro-esophageal reflux disease without esophagitis: Secondary | ICD-10-CM | POA: Insufficient documentation

## 2023-08-31 DIAGNOSIS — R0602 Shortness of breath: Secondary | ICD-10-CM | POA: Diagnosis not present

## 2023-08-31 DIAGNOSIS — N189 Chronic kidney disease, unspecified: Secondary | ICD-10-CM | POA: Insufficient documentation

## 2023-08-31 DIAGNOSIS — C641 Malignant neoplasm of right kidney, except renal pelvis: Secondary | ICD-10-CM | POA: Diagnosis not present

## 2023-08-31 DIAGNOSIS — E785 Hyperlipidemia, unspecified: Secondary | ICD-10-CM | POA: Insufficient documentation

## 2023-08-31 DIAGNOSIS — D49511 Neoplasm of unspecified behavior of right kidney: Secondary | ICD-10-CM | POA: Diagnosis not present

## 2023-08-31 DIAGNOSIS — Z01818 Encounter for other preprocedural examination: Secondary | ICD-10-CM | POA: Insufficient documentation

## 2023-08-31 DIAGNOSIS — G4733 Obstructive sleep apnea (adult) (pediatric): Secondary | ICD-10-CM | POA: Diagnosis not present

## 2023-08-31 HISTORY — DX: Chronic kidney disease, unspecified: N18.9

## 2023-08-31 HISTORY — DX: Other specified disorders of kidney and ureter: N28.89

## 2023-08-31 HISTORY — DX: Other chronic pain: G89.29

## 2023-08-31 LAB — GLUCOSE, CAPILLARY: Glucose-Capillary: 246 mg/dL — ABNORMAL HIGH (ref 70–99)

## 2023-08-31 LAB — BASIC METABOLIC PANEL
Anion gap: 11 (ref 5–15)
BUN: 27 mg/dL — ABNORMAL HIGH (ref 6–20)
CO2: 23 mmol/L (ref 22–32)
Calcium: 9.6 mg/dL (ref 8.9–10.3)
Chloride: 102 mmol/L (ref 98–111)
Creatinine, Ser: 1.88 mg/dL — ABNORMAL HIGH (ref 0.61–1.24)
GFR, Estimated: 41 mL/min — ABNORMAL LOW (ref >60)
Glucose, Bld: 249 mg/dL — ABNORMAL HIGH (ref 70–99)
Potassium: 4.2 mmol/L (ref 3.5–5.1)
Sodium: 136 mmol/L (ref 135–145)

## 2023-08-31 LAB — CBC
HCT: 40.8 % (ref 39.0–52.0)
Hemoglobin: 13.7 g/dL (ref 13.0–17.0)
MCH: 30.4 pg (ref 26.0–34.0)
MCHC: 33.6 g/dL (ref 30.0–36.0)
MCV: 90.5 fL (ref 80.0–100.0)
Platelets: 165 10*3/uL (ref 150–400)
RBC: 4.51 MIL/uL (ref 4.22–5.81)
RDW: 12.8 % (ref 11.5–15.5)
WBC: 6.2 10*3/uL (ref 4.0–10.5)
nRBC: 0 % (ref 0.0–0.2)

## 2023-08-31 LAB — NO BLOOD PRODUCTS

## 2023-08-31 LAB — HEMOGLOBIN A1C
Hgb A1c MFr Bld: 7.2 % — ABNORMAL HIGH (ref 4.8–5.6)
Mean Plasma Glucose: 159.94 mg/dL

## 2023-09-01 ENCOUNTER — Encounter (HOSPITAL_COMMUNITY): Payer: Self-pay

## 2023-09-01 ENCOUNTER — Encounter: Payer: Self-pay | Admitting: Nurse Practitioner

## 2023-09-01 NOTE — Anesthesia Preprocedure Evaluation (Addendum)
Anesthesia Evaluation  Patient identified by MRN, date of birth, ID band Patient awake    Reviewed: Allergy & Precautions, NPO status , Patient's Chart, lab work & pertinent test results  Airway Mallampati: III  TM Distance: >3 FB Neck ROM: Full    Dental no notable dental hx.    Pulmonary sleep apnea and Continuous Positive Airway Pressure Ventilation , former smoker   Pulmonary exam normal        Cardiovascular hypertension, Pt. on home beta blockers and Pt. on medications Normal cardiovascular exam     Neuro/Psych Deafness  negative psych ROS   GI/Hepatic ,GERD  Medicated and Controlled,,(+) Cirrhosis   Esophageal Varices      Endo/Other  diabetes  Patient on GLP-1 Agonist  Renal/GU Renal InsufficiencyRenal disease     Musculoskeletal  (+) Arthritis ,  Chronic back pain   Abdominal  (+) + obese  Peds  Hematology  (+) REFUSES BLOOD PRODUCTS, JEHOVAH'S WITNESS  Anesthesia Other Findings RIGHT RENAL MASS  Reproductive/Obstetrics                             Anesthesia Physical Anesthesia Plan  ASA: 3  Anesthesia Plan: General   Post-op Pain Management:    Induction: Intravenous  PONV Risk Score and Plan: 2 and Ondansetron, Midazolam and Treatment may vary due to age or medical condition  Airway Management Planned: Oral ETT  Additional Equipment:   Intra-op Plan:   Post-operative Plan: Extubation in OR  Informed Consent: I have reviewed the patients History and Physical, chart, labs and discussed the procedure including the risks, benefits and alternatives for the proposed anesthesia with the patient or authorized representative who has indicated his/her understanding and acceptance.     Dental advisory given  Plan Discussed with: CRNA  Anesthesia Plan Comments: (PAT note from 12/10 by Sherlie Ban PA-C )        Anesthesia Quick Evaluation

## 2023-09-01 NOTE — Progress Notes (Signed)
Case: [id number] Date/Time: 09/07/23 0715   Procedure: XI ROBOTIC ASSITED RIGHT PARTIAL NEPHRECTOMY (Right)   Anesthesia type: General   Pre-op diagnosis: RIGHT RENAL MASS   Location: [facility] RO[facility] 03 / [facility] ORS   Surgeons: [name], MD       DISCUSSION: Stephen Chase is a 57 yo male who who presents to PAT prior to surgery above.  Past medical history significant for hypertension, hyperlipidemia, CAD (by CT scan), COPD (by CT scan), OSA (uses CPAP), GERD, cirrhosis, CKD, renal mass, chronic back pain, deafness (uses sign language interpreter).  Patient had been seeing orthopedics for his back pain and incidentally on imaging a right renal mass was found and he was referred to urology.  Now scheduled for right nephrectomy.   Patient has been seeing cardiology for hyperlipidemia, hypertriglyceridemia, coronary and aortic atherosclerosis seen on CT scan, and shortness of breath.  He was seen on 05/18/2023 and it was recommended he undergo a cardiac PET scan to rule out obstructive CAD.  This was done on 9/3 and findings were consistent with ischemia in the RCA territory but was also a low risk study and there was no evidence of infarction.  Dr. Rennis Golden stated this would not explain his shortness of breath and fatigue and recommended to medically manage.  He states that if the patient develops chest pain would consider a cardiac cath. He was cleared for surgery:  "Chart reviewed as part of pre-operative protocol coverage. Given past medical history and time since last visit, based on ACC/AHA guidelines, Stephen Chase is at acceptable risk for the planned procedure without further cardiovascular testing.  Patient was last seen in office on 05/18/2023 by Dr. Rennis Golden.  Recent stress test was normal.     Per Dr. Rennis Golden, "OK to proceed with urologic surgery without further cardiac testing   Dr Rennis Golden"   Per office protocol, he may hold Aspirin for 5-7 days prior to procedure. Please resume Aspirin as soon  as possible postprocedure, at the discretion of the surgeon."    VS: BP 110/61   Pulse 78   Temp 36.9 C (Oral)   Resp 16   Ht 5' 9.5" (1.765 m)   Wt 99.3 kg   SpO2 97%   BMI 31.88 kg/m   PROVIDERS: [name], FNP Cardiology: Zoila Shutter, MD  LABS: Labs reviewed: Acceptable for surgery. (all labs ordered are listed, but only abnormal results are displayed)  Labs Reviewed  BASIC METABOLIC PANEL - Abnormal; Notable for the following components:      Result Value   Glucose, Bld 249 (*)    BUN 27 (*)    Creatinine, Ser 1.88 (*)    GFR, Estimated 41 (*)    All other components within normal limits  HEMOGLOBIN A1C - Abnormal; Notable for the following components:   Hgb A1c MFr Bld 7.2 (*)    All other components within normal limits  GLUCOSE, CAPILLARY - Abnormal; Notable for the following components:   Glucose-Capillary 246 (*)    All other components within normal limits  CBC  NO BLOOD PRODUCTS     IMAGES:  CT A/P 06/04/23:  IMPRESSION: 1. Right renal cell carcinoma.  No evidence of metastatic disease. 2. Cirrhosis. 3.  Aortic atherosclerosis (ICD10-I70.0).  CT Chest 11/25/22:  IMPRESSION: 1. Lung-RADS 2S, benign appearance or behavior. Continue annual screening with low-dose chest CT without contrast in 12 months. 2. The "S" modifier above refers to potentially clinically significant non lung cancer related findings. Specifically, there  is aortic atherosclerosis, in addition to left main and three-vessel coronary artery disease. Please note that although the presence of coronary artery calcium documents the presence of coronary artery disease, the severity of this disease and any potential stenosis cannot be assessed on this non-gated CT examination. Assessment for potential risk factor modification, dietary therapy or pharmacologic therapy may be warranted, if clinically indicated. 3. Mild diffuse bronchial wall thickening with mild centrilobular and  paraseptal emphysema; imaging findings suggestive of underlying COPD. 4. Hepatic steatosis.   Aortic Atherosclerosis (ICD10-I70.0) and Emphysema (ICD10-J43.9).    EKG 08/31/23  NSR, 78  CV: Narrative & Impression     Findings are consistent with ischemia in the right coronary artery territory. The study is low risk.   LV perfusion is abnormal. There is evidence of ischemia. There is no evidence of infarction. Defect 1: There is a medium defect with mild reduction in uptake present in the mid to basal inferior and inferolateral location(s) that is reversible. There is normal wall motion in the defect area. Consistent with ischemia. The defect is consistent with abnormal perfusion in the RCA territory. SDS 8.   Rest left ventricular function is normal. Rest EF: 47%. Stress left ventricular function is normal. Stress EF: 57%. End diastolic cavity size is normal. End systolic cavity size is normal.   Myocardial blood flow was computed to be 0.47ml/g/min at rest and 1.55ml/g/min at stress. Global myocardial blood flow reserve was 1.98 and was normal.   Coronary calcium was present on the attenuation correction CT images. Moderate coronary calcifications were present. Coronary calcifications were present in the left anterior descending artery, left circumflex artery and right coronary artery distribution(s).   Electronically Signed: Thurmon Fair, MD   Past Medical History:  Diagnosis Date   Allergy    Arthritis    SHOULDERS   Balanitis    Deafness, sensorineural, childhood onset    BILATERAL SINCE AGE 54  SECONDARY TO UNKNOWN ILLNESS   GERD (gastroesophageal reflux disease)    Hyperlipidemia    Hypertension    No blood products    Patient is Jehovah Witness no blood products.   OSA on CPAP    SEVERE PER STUDY 03-09-2009   Smokers' cough (HCC)    Type 2 diabetes mellitus (HCC)     Past Surgical History:  Procedure Laterality Date   CIRCUMCISION N/A 12/19/2013   Procedure:  CIRCUMCISION ADULT;  Surgeon: Su Grand, MD;  Location: [facility];  Service: Urology;  Laterality: N/A;   SHOULDER SURGERY Right    SHOULDER SURGERY Left 10/15/2021   WRIST SURGERY Left     MEDICATIONS:  Accu-Chek Softclix Lancets lancets   aspirin EC 81 MG tablet   Aspirin-Caffeine (BC FAST PAIN RELIEF ARTHRITIS PO)   Aspirin-Caffeine (BC FAST PAIN RELIEF PO)   atorvastatin (LIPITOR) 40 MG tablet   Blood Glucose Monitoring Suppl (ACCU-CHEK GUIDE) w/Device KIT   carvedilol (COREG) 6.25 MG tablet   cholecalciferol (VITAMIN D3) 25 MCG (1000 UNIT) tablet   fenofibrate (TRICOR) 145 MG tablet   glucose blood (ACCU-CHEK GUIDE) test strip   glucose blood test strip   icosapent Ethyl (VASCEPA) 1 g capsule   JARDIANCE 10 MG TABS tablet   Magnesium 250 MG TABS   modafinil (PROVIGIL) 200 MG tablet   Multiple Vitamins-Minerals (MULTIVITAMIN WITH MINERALS) tablet   olmesartan-hydrochlorothiazide (BENICAR HCT) 40-25 MG tablet   omeprazole (PRILOSEC) 10 MG capsule   Semaglutide, 2 MG/DOSE, (OZEMPIC, 2 MG/DOSE,) 8 MG/3ML SOPN   tamsulosin (  FLOMAX) 0.4 MG CAPS capsule   UNABLE TO FIND   No current facility-administered medications for this encounter.   Marcille Blanco [facility] Surgical Short Stay/Anesthesiology [facility] Phone [phone] 09/01/2023 11:51 AM

## 2023-09-08 NOTE — Telephone Encounter (Signed)
Medicaid denial rec'd.  Faxed reconsideration letter and denial letter to [employer].

## 2023-09-10 ENCOUNTER — Other Ambulatory Visit: Payer: Self-pay | Admitting: Internal Medicine

## 2023-09-10 DIAGNOSIS — E782 Mixed hyperlipidemia: Secondary | ICD-10-CM

## 2023-09-11 DIAGNOSIS — G4733 Obstructive sleep apnea (adult) (pediatric): Secondary | ICD-10-CM | POA: Diagnosis not present

## 2023-09-13 NOTE — Telephone Encounter (Signed)
Received notification from [employer] regarding approval for [facility]. Patient assistance approved from 09/10/23 to 09/09/24.  Medication will ship to PATIENTS HOME.  Pt ID: [id number]  Company phone: [phone]

## 2023-09-14 ENCOUNTER — Ambulatory Visit: Payer: Medicare PPO | Admitting: Nurse Practitioner

## 2023-09-14 DIAGNOSIS — G4733 Obstructive sleep apnea (adult) (pediatric): Secondary | ICD-10-CM | POA: Diagnosis not present

## 2023-09-16 ENCOUNTER — Ambulatory Visit: Payer: Self-pay | Admitting: Nurse Practitioner

## 2023-09-21 ENCOUNTER — Ambulatory Visit (INDEPENDENT_AMBULATORY_CARE_PROVIDER_SITE_OTHER): Payer: Medicare PPO | Admitting: Nurse Practitioner

## 2023-09-21 ENCOUNTER — Encounter: Payer: Self-pay | Admitting: Nurse Practitioner

## 2023-09-21 VITALS — BP 100/60 | HR 73 | Temp 97.9°F | Wt 217.2 lb

## 2023-09-21 DIAGNOSIS — E785 Hyperlipidemia, unspecified: Secondary | ICD-10-CM

## 2023-09-21 DIAGNOSIS — I10 Essential (primary) hypertension: Secondary | ICD-10-CM

## 2023-09-21 DIAGNOSIS — Z2821 Immunization not carried out because of patient refusal: Secondary | ICD-10-CM

## 2023-09-21 DIAGNOSIS — Z6831 Body mass index (BMI) 31.0-31.9, adult: Secondary | ICD-10-CM

## 2023-09-21 DIAGNOSIS — E66811 Obesity, class 1: Secondary | ICD-10-CM | POA: Diagnosis not present

## 2023-09-21 DIAGNOSIS — E1142 Type 2 diabetes mellitus with diabetic polyneuropathy: Secondary | ICD-10-CM | POA: Diagnosis not present

## 2023-09-21 DIAGNOSIS — E6609 Other obesity due to excess calories: Secondary | ICD-10-CM | POA: Insufficient documentation

## 2023-09-21 DIAGNOSIS — E1169 Type 2 diabetes mellitus with other specified complication: Secondary | ICD-10-CM | POA: Diagnosis not present

## 2023-09-21 DIAGNOSIS — E781 Pure hyperglyceridemia: Secondary | ICD-10-CM

## 2023-09-21 NOTE — Assessment & Plan Note (Signed)

## 2023-09-21 NOTE — Progress Notes (Signed)
 LILLETTE Kristeen JINNY Gladis, CMA,acting as a neurosurgeon for [name], FNP.,have documented all relevant documentation on the behalf of [name], FNP,as directed by  [name], FNP while in the presence of [name], FNP.  Subjective:  Patient ID: Stephen Chase , male    DOB: [birth year] , 57 y.o.   MRN: [id number]  Chief Complaint  Patient presents with   Diabetes    HPI  Patient presents today for a bp and dm follow up, Patient reports compliance with medication. Patient denies any chest pain, SOB, or headaches. Patient has no concerns today. He reports he is eating a healthier diet and cutting back on fried foods.   He has been cleared by Cardiology to have his renal mass removed. His surgery date is 10/20/2023.   Here today with deaf interpreter Richardson.   Diabetes He presents for his follow-up diabetic visit. He has type 2 diabetes mellitus. His disease course has been stable. Pertinent negatives for hypoglycemia include no dizziness. Pertinent negatives for diabetes include no chest pain, no polydipsia, no polyphagia and no polyuria. There are no hypoglycemic complications. Symptoms are stable. Diabetic complications include peripheral neuropathy. Risk factors for coronary artery disease include obesity, sedentary lifestyle, diabetes mellitus and male sex. Current diabetic treatment includes oral agent (dual therapy). His weight is stable. He is following a generally unhealthy diet. When asked about meal planning, he reported none. He has not had a previous visit with a dietitian. He participates in exercise intermittently (he is walking alot and working a strenuous job.). There is no change in his home blood glucose trend. (Blood sugar readings 129-213. ) An ACE inhibitor/angiotensin II receptor blocker is being taken. He does not see a podiatrist.Eye exam is current.  Hypertension This is a chronic problem. The current episode started more than 1 year ago. The problem has been gradually improving  since onset. The problem is controlled. Pertinent negatives include no chest pain, palpitations or shortness of breath. There are no associated agents to hypertension. Risk factors for coronary artery disease include obesity, sedentary lifestyle, dyslipidemia, diabetes mellitus and male gender. Past treatments include ACE inhibitors. The current treatment provides significant improvement. There are no compliance problems.  There is no history of kidney disease. Identifiable causes of hypertension include chronic renal disease.    Past Medical History:  Diagnosis Date   Allergy    Arthritis    SHOULDERS   Balanitis    Chronic back pain    CKD (chronic kidney disease)    Deafness, sensorineural, childhood onset    BILATERAL SINCE AGE 37  SECONDARY TO UNKNOWN ILLNESS   GERD (gastroesophageal reflux disease)    Hyperlipidemia    Hypertension    No blood products    Patient is Jehovah Witness no blood products.   OSA on CPAP    SEVERE PER STUDY 03-09-2009   Right renal mass    Smokers' cough (HCC)    Type 2 diabetes mellitus (HCC)      Family History  Problem Relation Age of Onset   Cancer Mother        type unknown pt was 54 years old at that time   Liver disease Father    Healthy Brother    Cancer Other         alot of family members have cancer but doesnt know the type   Colon cancer Neg Hx    Esophageal cancer Neg Hx    Rectal cancer Neg Hx    Stomach  cancer Neg Hx    Sleep apnea Neg Hx      Current Outpatient Medications:    Accu-Chek Softclix Lancets lancets, Use as instructed to test blood sugars 3 times a day dx code e11.65, Disp: 200 each, Rfl: 3   aspirin EC 81 MG tablet, Take 81 mg by mouth at bedtime. Swallow whole., Disp: , Rfl:    Aspirin-Caffeine (BC FAST PAIN RELIEF ARTHRITIS PO), Take 1 packet by mouth daily as needed (pain)., Disp: , Rfl:    Aspirin-Caffeine (BC FAST PAIN RELIEF PO), Take 1 packet by mouth daily as needed (pain)., Disp: , Rfl:    atorvastatin   (LIPITOR) 40 MG tablet, TAKE 1 TABLET(40 MG) BY MOUTH AT BEDTIME, Disp: , Rfl:    Blood Glucose Monitoring Suppl (ACCU-CHEK GUIDE) w/Device KIT, Use to check blood sugars three times daily E11.9, Disp: 1 kit, Rfl: 0   carvedilol  (COREG ) 6.25 MG tablet, TAKE 1 TABLET(6.25 MG) BY MOUTH TWICE DAILY WITH A MEAL, Disp: 180 tablet, Rfl: 3   cholecalciferol (VITAMIN D3) 25 MCG (1000 UNIT) tablet, Take 1,000 Units by mouth daily., Disp: , Rfl:    fenofibrate  (TRICOR ) 145 MG tablet, TAKE 1 TABLET(145 MG) BY MOUTH DAILY, Disp: 90 tablet, Rfl: 1   glucose blood (ACCU-CHEK GUIDE) test strip, CHECK BLOOD SUGAR 3 TIMES A DAY BEFORE BREAKFAST, LUNCH, AND DINNER, Disp: 150 strip, Rfl: 2   glucose blood test strip, Use as instructed to test blood sugars 3 times a day dx code e11.65, Disp: 100 each, Rfl: 12   icosapent  Ethyl (VASCEPA ) 1 g capsule, One bid for elevated triglycerides, Disp: 180 capsule, Rfl: 3   JARDIANCE  10 MG TABS tablet, TAKE 1 TABLET(10 MG) BY MOUTH DAILY BEFORE BREAKFAST, Disp: 90 tablet, Rfl: 1   Magnesium  250 MG TABS, Take 1 tablet (250 mg total) by mouth every evening. With evening meals, Disp: 30 tablet, Rfl: 2   modafinil  (PROVIGIL ) 200 MG tablet, Take 1 tablet (200 mg total) by mouth daily., Disp: 30 tablet, Rfl: 5   Multiple Vitamins-Minerals (MULTIVITAMIN WITH MINERALS) tablet, Take 1 tablet by mouth daily., Disp: , Rfl:    olmesartan -hydrochlorothiazide (BENICAR  HCT) 40-25 MG tablet, TAKE 1 TABLET EVERY DAY, Disp: 90 tablet, Rfl: 3   omeprazole  (PRILOSEC) 10 MG capsule, TAKE 1 CAPSULE(10 MG) BY MOUTH DAILY, Disp: 90 capsule, Rfl: 1   Semaglutide , 2 MG/DOSE, (OZEMPIC , 2 MG/DOSE,) 8 MG/3ML SOPN, Inject 2 mg into the skin once a week., Disp: 3 mL, Rfl: 1   tamsulosin  (FLOMAX ) 0.4 MG CAPS capsule, TAKE 1 CAPSULE BY MOUTH EVERY DAY 30 MINUTES AFTER THE SAME MEAL, Disp: 90 capsule, Rfl: 3   UNABLE TO FIND, CPAP for OSA (6cm-19cm), Disp: , Rfl:    Allergies  Allergen Reactions   Other      Blood Refusal      Review of Systems  Constitutional: Negative.   HENT: Negative.    Eyes: Negative.   Respiratory: Negative.  Negative for shortness of breath.   Cardiovascular: Negative.  Negative for chest pain and palpitations.  Gastrointestinal: Negative.   Endocrine: Negative for polydipsia, polyphagia and polyuria.  Neurological: Negative.  Negative for dizziness.  Psychiatric/Behavioral: Negative.       Today's Vitals   09/21/23 0946  BP: 100/60  Pulse: 73  Temp: 97.9 F (36.6 C)  TempSrc: Oral  Weight: 217 lb 3.2 oz (98.5 kg)  PainSc: 5   PainLoc: Back   Body mass index is 31.61 kg/m.  Wt Readings from Last  3 Encounters:  09/21/23 217 lb 3.2 oz (98.5 kg)  08/31/23 219 lb (99.3 kg)  06/10/23 228 lb (103.4 kg)      Objective:  Physical Exam Vitals reviewed.  Constitutional:      General: He is not in acute distress.    Appearance: Normal appearance. He is obese.  Eyes:     Pupils: Pupils are equal, round, and reactive to light.  Cardiovascular:     Rate and Rhythm: Normal rate and regular rhythm.     Pulses: Normal pulses.     Heart sounds: Normal heart sounds. No murmur heard. Pulmonary:     Effort: Pulmonary effort is normal. No respiratory distress.     Breath sounds: Normal breath sounds. No wheezing.  Musculoskeletal:     Right shoulder: Normal.  Skin:    General: Skin is warm and dry.     Capillary Refill: Capillary refill takes less than 2 seconds.  Neurological:     General: No focal deficit present.     Mental Status: He is alert and oriented to person, place, and time.     Cranial Nerves: No cranial nerve deficit.     Motor: No weakness.  Psychiatric:        Mood and Affect: Mood normal.        Behavior: Behavior normal.        Thought Content: Thought content normal.        Judgment: Judgment normal.      Title   Diabetic Foot Exam - detailed Date & Time: 09/21/2023 10:20 AM Diabetic Foot exam was performed with the following  findings: Yes  Visual Foot Exam completed.: Yes  Is there a history of foot ulcer?: No Is there a foot ulcer now?: No Is there swelling?: No Is there elevated skin temperature?: No Is there abnormal foot shape?: No Is there a claw toe deformity?: No Are the toenails long?: No Are the toenails thick?: No Are the toenails ingrown?: No Is the skin thin, fragile, shiny and hairless?: No Normal Range of Motion?: Yes Is there foot or ankle muscle weakness?: No Do you have pain in calf while walking?: No Are the shoes appropriate in style and fit?: Yes Can the patient see the bottom of their feet?: Yes Pulse Foot Exam completed.: Yes   Right Posterior Tibialis: Present Left posterior Tibialis: Present   Right Dorsalis Pedis: Present Left Dorsalis Pedis: Present     Sensory Foot Exam Completed.: Yes Semmes-Weinstein Monofilament Test + means has sensation and - means no sensation   R Site 1-Great Toe: Pos L Site 1-Great Toe: Pos   R Site 4: Pos L Site 4: Pos   R Site 6: Pos L Site 6: Pos     Image components are not supported.   Image components are not supported. Image components are not supported.  [place] Comments     Assessment And Plan:  Essential hypertension Assessment & Plan: Blood pressure is well controlled, continue current medications   Type 2 diabetes mellitus with hyperlipidemia (HCC) Assessment & Plan: Hemoglobin A1c is improving.  He is tolerating his medications well.  Diabetic foot exam done normal findings   Hypertriglyceridemia Assessment & Plan: Cholesterol levels were increased with the triglycerides.  Will recheck today  Orders: -     Lipid panel  Influenza vaccination declined Assessment & Plan: Patient declined influenza vaccination at this time. Patient is aware that influenza vaccine prevents illness in 70% of healthy people, and reduces  hospitalizations to 30-70% in elderly. This vaccine is recommended annually. Education  has been provided regarding the importance of this vaccine but patient still declined. Advised may receive this vaccine at local pharmacy or Health Dept.or vaccine clinic. Aware to provide a copy of the vaccination record if obtained from local pharmacy or Health Dept.  Pt is willing to accept risk associated with refusing vaccination.    COVID-19 vaccination declined Assessment & Plan: Declines covid 19 vaccine. Discussed risk of covid 73 and if he changes her mind about the vaccine to call the office. Education has been provided regarding the importance of this vaccine but patient still declined. Advised may receive this vaccine at local pharmacy or Health Dept.or vaccine clinic. Aware to provide a copy of the vaccination record if obtained from local pharmacy or Health Dept.  Encouraged to take multivitamin, vitamin d, vitamin c and zinc to increase immune system. Aware can call office if would like to have vaccine here at office. Verbalized acceptance and understanding.    Class 1 obesity due to excess calories with body mass index (BMI) of 31.0 to 31.9 in adult, unspecified whether serious comorbidity present Assessment & Plan: Congratulated him on his weight loss.  Continue focusing on healthy diet and regular physical activity with a goal BMI less than 30     Return for controlled DM check 4 months.  Patient was given opportunity to ask questions. Patient verbalized understanding of the plan and was able to repeat key elements of the plan. All questions were answered to their satisfaction.    LILLETTE [name], FNP, have reviewed all documentation for this visit. The documentation on 09/21/23 for the exam, diagnosis, procedures, and orders are all accurate and complete.   IF YOU HAVE BEEN REFERRED TO A SPECIALIST, IT MAY TAKE 1-2 WEEKS TO SCHEDULE/PROCESS THE REFERRAL. IF YOU HAVE NOT HEARD FROM US /SPECIALIST IN TWO WEEKS, PLEASE GIVE US  A CALL AT [phone].

## 2023-09-21 NOTE — Assessment & Plan Note (Addendum)
Hemoglobin A1c is improving.  He is tolerating his medications well.  Diabetic foot exam done normal findings

## 2023-09-21 NOTE — Assessment & Plan Note (Signed)
 Blood pressure is well controlled, continue current medications.

## 2023-09-21 NOTE — Assessment & Plan Note (Signed)

## 2023-09-21 NOTE — Assessment & Plan Note (Signed)
Congratulated him on his weight loss.  Continue focusing on healthy diet and regular physical activity with a goal BMI less than 30

## 2023-09-21 NOTE — Assessment & Plan Note (Signed)
Cholesterol levels were increased with the triglycerides.  Will recheck today

## 2023-09-22 LAB — LIPID PANEL
Chol/HDL Ratio: 7.7 {ratio} — ABNORMAL HIGH (ref 0.0–5.0)
Cholesterol, Total: 184 mg/dL (ref 100–199)
HDL: 24 mg/dL — ABNORMAL LOW (ref >39)
LDL Chol Calc (NIH): 78 mg/dL (ref 0–99)
Triglycerides: 510 mg/dL — ABNORMAL HIGH (ref 0–149)
VLDL Cholesterol Cal: 82 mg/dL — ABNORMAL HIGH (ref 5–40)

## 2023-09-24 DIAGNOSIS — E782 Mixed hyperlipidemia: Secondary | ICD-10-CM | POA: Diagnosis not present

## 2023-09-24 LAB — LIPID PANEL
Chol/HDL Ratio: 7.2 {ratio} — ABNORMAL HIGH (ref 0.0–5.0)
Cholesterol, Total: 181 mg/dL (ref 100–199)
HDL: 25 mg/dL — ABNORMAL LOW (ref >39)
LDL Chol Calc (NIH): 84 mg/dL (ref 0–99)
Triglycerides: 443 mg/dL — ABNORMAL HIGH (ref 0–149)
VLDL Cholesterol Cal: 72 mg/dL — ABNORMAL HIGH (ref 5–40)

## 2023-09-24 LAB — LDL CHOLESTEROL, DIRECT: LDL Direct: 89 mg/dL (ref 0–99)

## 2023-09-29 ENCOUNTER — Ambulatory Visit (HOSPITAL_BASED_OUTPATIENT_CLINIC_OR_DEPARTMENT_OTHER): Payer: Medicare PPO | Admitting: Internal Medicine

## 2023-09-29 VITALS — BP 98/50 | HR 73 | Ht 69.0 in | Wt 225.4 lb

## 2023-09-29 DIAGNOSIS — R42 Dizziness and giddiness: Secondary | ICD-10-CM | POA: Diagnosis not present

## 2023-09-29 DIAGNOSIS — E781 Pure hyperglyceridemia: Secondary | ICD-10-CM | POA: Diagnosis not present

## 2023-09-29 DIAGNOSIS — E785 Hyperlipidemia, unspecified: Secondary | ICD-10-CM

## 2023-09-29 MED ORDER — CARVEDILOL 3.125 MG PO TABS: 3.1250 mg | ORAL_TABLET | Freq: Two times a day (BID) | ORAL | 3 refills | Status: DC

## 2023-09-29 MED ORDER — EZETIMIBE 10 MG PO TABS: 10.0000 mg | ORAL_TABLET | Freq: Every day | ORAL | 3 refills | Status: DC

## 2023-09-29 NOTE — Patient Instructions (Signed)
 Medication Instructions:  DECREASE carvedilol  to 3.125mg  twice daily  START zetia  10mg  daily  CONTINUE all other current medications  *If you need a refill on your cardiac medications before your next appointment, please call your pharmacy*   Lab Work: FASTING lab work in 3-4 months  If you have labs (blood work) drawn today and your tests are completely normal, you will receive your results only by: [employer] (if you have MyChart) OR A paper copy in the mail If you have any lab test that is abnormal or we need to change your treatment, we will call you to review the results.   Follow-Up: At [facility], you and your health needs are our priority.  As part of our continuing mission to provide you with exceptional heart care, we have created designated Provider Care Teams.  These Care Teams include your primary Cardiologist (physician) and Advanced Practice Providers (APPs -  Physician Assistants and Nurse Practitioners) who all work together to provide you with the care you need, when you need it.  We recommend signing up for the patient portal called MyChart.  Sign up information is provided on this After Visit Summary.  MyChart is used to connect with patients for Virtual Visits (Telemedicine).  Patients are able to view lab/test results, encounter notes, upcoming appointments, etc.  Non-urgent messages can be sent to your provider as well.   To learn more about what you can do with MyChart, go to [email or web address].    Your next appointment:    3-4 months with Dr. Mona

## 2023-09-29 NOTE — Progress Notes (Signed)
 LIPID CLINIC CONSULT NOTE  Chief Complaint:  Manage dyslipidemia  Primary Care Physician: Georgina Speaks, FNP  Primary Cardiologist:  None  HPI:  Stephen Chase is a 58 y.o. male who is being seen today for the evaluation of dyslipidemia at the request of Georgina Speaks, FNP.  This is a pleasant 58 year old male kindly referred for evaluation management of dyslipidemia.  He is seen today with his partner and a professional sign language interpreter.  He is referred for evaluation management of dyslipidemia, specifically elevated triglycerides.  His last lipid profile showed a total cholesterol of 223, triglycerides 748, HDL 24 and LDL 79.  He has had intermittent high triglycerides in the past.  Therapy for this includes atorvastatin  40 mg nightly, fenofibrate  on 45 mg daily and intermittently he takes fish oil.  He was prescribed Vascepa  at 1 point however he said they had not filled that medication.  Of note he has a past smoking history of about 30 pack years.  He quit 8 years ago.  He had a CT scan for lung cancer screening in March of this year, which showed no evidence of mass or tumor, however there were findings consistent with COPD, hepatic steatosis, aortic atherosclerosis, and left main and three-vessel coronary artery calcification.  I did explain these findings to him today indicating that he does have coronary artery disease.  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  He is not currently on any inhalers or treatments for COPD.  He specifically denied any chest pain.  09/29/2023  Stephen Chase is seen today in follow-up with a sign language interpreter.  He had recent repeat lipid testing which shows some mild improvement in triglycerides.  Total cholesterol 181, triglycerides 443, HDL 25 and LDL 89.  He had recent imaging which unfortunately showed renal cancer.  He is  scheduled for surgical resection of this next month.  From a cardiac standpoint he should be okay to proceed with that procedure.  He did have a PET CT scan which showed mild reversible ischemia in the RCA distribution however he has had no anginal symptoms.  I started him on aspirin and working to intensify his lipids.  His goal LDL should be less than 70 and this still remains a target of therapy.  He has also had some low blood pressure issues.  He reports intermittent dizziness.  Today blood pressure was 98/50.  PMHx:  Past Medical History:  Diagnosis Date   Allergy    Arthritis    SHOULDERS   Balanitis    Chronic back pain    CKD (chronic kidney disease)    Deafness, sensorineural, childhood onset    BILATERAL SINCE AGE 60  SECONDARY TO UNKNOWN ILLNESS   GERD (gastroesophageal reflux disease)    Hyperlipidemia    Hypertension    No blood products    Patient is Jehovah Witness no blood products.   OSA on CPAP    SEVERE PER STUDY 03-09-2009   Right renal mass    Smokers' cough (HCC)    Type 2 diabetes mellitus (HCC)     Past Surgical History:  Procedure Laterality Date   CIRCUMCISION N/A 12/19/2013   Procedure: CIRCUMCISION ADULT;  Surgeon: Oliva Oiler, MD;  [facility];  Service: Urology;  Laterality: N/A;   SHOULDER SURGERY Right    SHOULDER SURGERY Left 10/15/2021  WRIST SURGERY Left     FAMHx:  Family History  Problem Relation Age of Onset   Cancer Mother        type unknown pt was 50 years old at that time   Liver disease Father    Healthy Brother    Cancer Other         alot of family members have cancer but doesnt know the type   Colon cancer Neg Hx    Esophageal cancer Neg Hx    Rectal cancer Neg Hx    Stomach cancer Neg Hx    Sleep apnea Neg Hx     SOCHx:   reports that he has quit smoking. His smoking use included cigarettes. He has a 30 pack-year smoking history. He has never used smokeless tobacco. He reports that he does not  currently use alcohol. He reports that he does not currently use drugs.  ALLERGIES:  Allergies  Allergen Reactions   Other     Blood Refusal     ROS: Pertinent items noted in HPI and remainder of comprehensive ROS otherwise negative.  HOME MEDS: Current Outpatient Medications on File Prior to Visit  Medication Sig Dispense Refill   Accu-Chek Softclix Lancets lancets Use as instructed to test blood sugars 3 times a day dx code e11.65 200 each 3   aspirin EC 81 MG tablet Take 81 mg by mouth at bedtime. Swallow whole.     Aspirin-Caffeine (BC FAST PAIN RELIEF ARTHRITIS PO) Take 1 packet by mouth daily as needed (pain).     Aspirin-Caffeine (BC FAST PAIN RELIEF PO) Take 1 packet by mouth daily as needed (pain).     atorvastatin  (LIPITOR) 40 MG tablet TAKE 1 TABLET(40 MG) BY MOUTH AT BEDTIME     Blood Glucose Monitoring Suppl (ACCU-CHEK GUIDE) w/Device KIT Use to check blood sugars three times daily E11.9 1 kit 0   carvedilol  (COREG ) 6.25 MG tablet TAKE 1 TABLET(6.25 MG) BY MOUTH TWICE DAILY WITH A MEAL 180 tablet 3   cholecalciferol (VITAMIN D3) 25 MCG (1000 UNIT) tablet Take 1,000 Units by mouth daily.     Dapagliflozin  Propanediol (FARXIGA  PO) Take by mouth.     fenofibrate  (TRICOR ) 145 MG tablet TAKE 1 TABLET(145 MG) BY MOUTH DAILY 90 tablet 1   glucose blood (ACCU-CHEK GUIDE) test strip CHECK BLOOD SUGAR 3 TIMES A DAY BEFORE BREAKFAST, LUNCH, AND DINNER 150 strip 2   glucose blood test strip Use as instructed to test blood sugars 3 times a day dx code e11.65 100 each 12   icosapent  Ethyl (VASCEPA ) 1 g capsule One bid for elevated triglycerides 180 capsule 3   JARDIANCE  10 MG TABS tablet TAKE 1 TABLET(10 MG) BY MOUTH DAILY BEFORE BREAKFAST 90 tablet 1   Magnesium  250 MG TABS Take 1 tablet (250 mg total) by mouth every evening. With evening meals 30 tablet 2   modafinil  (PROVIGIL ) 200 MG tablet Take 1 tablet (200 mg total) by mouth daily. 30 tablet 5   Multiple Vitamins-Minerals  (MULTIVITAMIN WITH MINERALS) tablet Take 1 tablet by mouth daily.     olmesartan -hydrochlorothiazide (BENICAR  HCT) 40-25 MG tablet TAKE 1 TABLET EVERY DAY 90 tablet 3   omeprazole  (PRILOSEC) 10 MG capsule TAKE 1 CAPSULE(10 MG) BY MOUTH DAILY 90 capsule 1   Semaglutide , 2 MG/DOSE, (OZEMPIC , 2 MG/DOSE,) 8 MG/3ML SOPN Inject 2 mg into the skin once a week. 3 mL 1   tamsulosin  (FLOMAX ) 0.4 MG CAPS capsule TAKE 1 CAPSULE BY MOUTH EVERY  DAY 30 MINUTES AFTER THE SAME MEAL 90 capsule 3   UNABLE TO FIND CPAP for OSA (6cm-19cm)     No current facility-administered medications on file prior to visit.    LABS/IMAGING: No results found for this or any previous visit (from the past 48 hours). No results found.  LIPID PANEL:    Component Value Date/Time   CHOL 181 09/24/2023 0821   TRIG 443 (H) 09/24/2023 0821   HDL 25 (L) 09/24/2023 0821   CHOLHDL 7.2 (H) 09/24/2023 0821   LDLCALC 84 09/24/2023 0821   LDLDIRECT 89 09/24/2023 0821    WEIGHTS: Wt Readings from Last 3 Encounters:  09/21/23 217 lb 3.2 oz (98.5 kg)  08/31/23 219 lb (99.3 kg)  06/10/23 228 lb (103.4 kg)    VITALS: There were no vitals taken for this visit.  EXAM: Deferred  EKG: Deferred  ASSESSMENT: Mixed dyslipidemia with high triglycerides Progressive dyspnea on exertion and fatigue Left main and multivessel coronary artery calcification Aortic atherosclerosis 30-pack-year smoking history COPD Hearing impaired Abnormal cardiac PET showing mild RCA distribution reversible ischemia (05/2023)  PLAN: 1.   Stephen Chase remains above a target LDL less than 70.  I would advise adding ezetimibe  10 mg daily for additional lipid-lowering which may also help his triglycerides.  In addition the blood pressure was low today and has had intermittent dizziness.  Will decrease his carvedilol  to 3.125 mg twice daily.  He is okay to proceed with upcoming kidney resection for cancer.  Will plan follow-up with him in about 3 to 4 months  with repeat lipids at that time.  Stephen KYM Maxcy, MD, [facility], FACP  [place]  [facility]  Medical Director of the Advanced Lipid Disorders &  Cardiovascular Risk Reduction Clinic Diplomate of the American Board of Clinical Lipidology Attending Cardiologist  Direct Dial: [phone]  Fax: [phone]  Website:  [email or web address] Stephen BROCKS Zoey Bidwell 09/29/2023, 9:42 AM

## 2023-09-30 ENCOUNTER — Other Ambulatory Visit: Payer: Self-pay

## 2023-09-30 ENCOUNTER — Encounter: Payer: Self-pay | Admitting: Nurse Practitioner

## 2023-09-30 MED ORDER — GLUCOSE BLOOD VI STRP: ORAL_STRIP | 12 refills | Status: DC

## 2023-10-01 NOTE — Addendum Note (Signed)
 Addended by: Lindell Spar on: 10/01/2023 07:04 AM   Modules accepted: Orders

## 2023-10-05 ENCOUNTER — Telehealth: Payer: Self-pay | Admitting: Adult Health

## 2023-10-05 NOTE — Telephone Encounter (Signed)
 Pt confirming appt details via voice interpreter

## 2023-10-05 NOTE — Telephone Encounter (Signed)
 Interpreter is unavailable so Virtual interpreter machine will need to be used.

## 2023-10-05 NOTE — Progress Notes (Signed)
 PATIENT: Stephen Chase DOB: 07-Oct-1965  REASON FOR VISIT: follow up HISTORY FROM: patient, sign language interpreter present  Chief Complaint  Patient presents with   Follow-up    Rm 19,  Felicia Newton, signs for deaf, initial cpap. Feels rested.     HISTORY OF PRESENT ILLNESS: Today 10/05/23:  Stephen Chase is a 58 y.o. male with a history of OSA on CPAP. Returns today for follow-up.  Reports that CPAP is working well for him.  He states that he sometimes runs out of water .  Has never adjusted his humidity.  He continues to find it beneficial.  His download is below.  He continues to use Provigil  and finds it beneficial.  He states if he misses the medication he can tell a big difference.     06/10/23: Stephen Chase is a 58 y.o. male with a history of OSA on CPAP. Returns today for follow-up.  Reports that CPAP is working well for him.  He would like a new machine.  His set up date for this machine was 2015.  His download is below        10/27/22: Stephen Chase is a 58 y.o. male with a history of OSA on CPAP. Returns today for follow-up.  Reports that CPAP is working well for him.  Denies any new issues.  He states that he uses Provigil  daily.  He states that it works well.  However he is noticing that sometimes he is having a hard time falling asleep at night.  His download is below     10/09/21: Stephen Chase is a 58 year old male with a history of obstructive sleep apnea on CPAP.  He returns today for follow-up.  He reports that CPAP works well for him.  He denies any new issues.  He returns today for follow-up.  10/09/20: Stephen Chase is a 58 year old male with a history of obstructive sleep apnea on CPAP.  Patient reports that he tolerates the CPAP well.  He does find it beneficial.  Denies any new issues.  His CPAP report is indicates:  Compliance Report Usage 09/09/2020 - 10/08/2020 Usage days 30/30 days (100%) >= 4 hours 27 days (90%) Average usage (days used) 5 hours 50  minutes  AirSense 10 AutoSet Serial number [id number] Mode AutoSet Min Pressure 6 cmH2O Max Pressure 19 cmH2O EPR Fulltime EPR level 3  Therapy Pressure - cmH2O Median: 10.0 95th percentile: 14.5 Maximum: 16.3 Leaks - L/min Median: 0.0 95th percentile: 2.1 Maximum: 32.8 Events per hour AI: 1.8 HI: 0.9 AHI: 2.7 Apnea Index Central: 0.2 Obstructive: 1.6 Unknown: 0.0   10/04/19: Stephen Chase is a 58 year old male with a history of obstructive sleep apnea on CPAP.  His download indicates that he uses machine 29 out of 30 days for compliance of 97%.  He uses machine greater than 4 hours 25 days for compliance of 83%.  On average he uses his machine 5 hours and 59 minutes.  His residual AHI is 2.7 on 6 to 19 cm of water  with EPR of 3.  His leak in the 95th percentile is 6.6 L/min.  He states that he continues to take modafinil .  He reports that this offers him good benefit.  He states that he was confused about Flomax  and was taking it during the day and this was causing daytime sleepiness.  Now that he is taking it at bedtime his sleepiness has improved.  He returns today for an evaluation.  HISTORY (Copied from Dr.Dohmeier's note) Stephen Chase  is a 58 y.o. male , seen here as in a referral  from PA Susanna Epley for a re- evaluation of sleep apnea,in the presence of his sign Presenter, broadcasting.  09-28-2018, follow up on CPAP titration and compliance.  Mr. Andy Bannister was evaluated by a home sleep study dated 08 June 2018 by watch pat device.  The total AHI was 16.6/h there was mild to moderate snoring noticed, oxygen nadir was 89%, average heart rate was 56 bpm.  The patient had already been on CPAP before and we continued CPAP treatment with an auto titration device.  I have the opportunity to see the excellent compliance the patient has used the machine 30 out of 30 days and 26 of those days over 4 hours.  Average use of time is 5 hours 58 minutes each night the AutoSet has a pressure window between 6  cmH2O and 19 cm water  pressure with 3 cm expiratory pressure relief.  The 95th percentile pressure is 16 cmH2O with an AHI of 6.2 this seems to be related to unknown events at 2.5/h and I am not quite sure where they come from.   He does not have central apneas and he has very minimal air leakage.  I noticed that only 3 out of 30 nights had a very high AHI and this may have been a artifactual count the general average AHI is truly under 2/h.    REVIEW OF SYSTEMS: Out of a complete 14 system review of symptoms, the patient complains only of the following symptoms, and all other reviewed systems are negative.  Epworth sleepiness score 14  ALLERGIES: Allergies  Allergen Reactions   Other     Blood Refusal     HOME MEDICATIONS: Outpatient Medications Prior to Visit  Medication Sig Dispense Refill   Accu-Chek Softclix Lancets lancets Use as instructed to test blood sugars 3 times a day dx code e11.65 200 each 3   aspirin EC 81 MG tablet Take 81 mg by mouth at bedtime. Swallow whole.     atorvastatin  (LIPITOR) 40 MG tablet TAKE 1 TABLET(40 MG) BY MOUTH AT BEDTIME     Blood Glucose Monitoring Suppl (ACCU-CHEK GUIDE) w/Device KIT Use to check blood sugars three times daily E11.9 1 kit 0   carvedilol  (COREG ) 3.125 MG tablet Take 1 tablet (3.125 mg total) by mouth 2 (two) times daily. 180 tablet 3   cholecalciferol (VITAMIN D3) 25 MCG (1000 UNIT) tablet Take 1,000 Units by mouth daily.     Dapagliflozin  Propanediol (FARXIGA  PO) Take 10 mg by mouth daily.     ezetimibe  (ZETIA ) 10 MG tablet Take 1 tablet (10 mg total) by mouth daily. 90 tablet 3   fenofibrate  (TRICOR ) 145 MG tablet TAKE 1 TABLET(145 MG) BY MOUTH DAILY 90 tablet 1   glucose blood (ACCU-CHEK GUIDE) test strip CHECK BLOOD SUGAR 3 TIMES A DAY BEFORE BREAKFAST, LUNCH, AND DINNER 150 strip 2   glucose blood test strip Use as instructed to test blood sugars 3 times a day dx code e11.65 100 each 12   icosapent  Ethyl (VASCEPA ) 1 g capsule  One bid for elevated triglycerides 180 capsule 3   Magnesium  250 MG TABS Take 1 tablet (250 mg total) by mouth every evening. With evening meals 30 tablet 2   modafinil  (PROVIGIL ) 200 MG tablet Take 1 tablet (200 mg total) by mouth daily. 30 tablet 5   Multiple Vitamins-Minerals (MULTIVITAMIN WITH MINERALS) tablet Take 1 tablet by mouth daily.     olmesartan -hydrochlorothiazide (  BENICAR  HCT) 40-25 MG tablet TAKE 1 TABLET EVERY DAY 90 tablet 3   omeprazole  (PRILOSEC) 10 MG capsule TAKE 1 CAPSULE(10 MG) BY MOUTH DAILY 90 capsule 1   Semaglutide , 2 MG/DOSE, (OZEMPIC , 2 MG/DOSE,) 8 MG/3ML SOPN Inject 2 mg into the skin once a week. 3 mL 1   tamsulosin  (FLOMAX ) 0.4 MG CAPS capsule TAKE 1 CAPSULE BY MOUTH EVERY DAY 30 MINUTES AFTER THE SAME MEAL 90 capsule 3   UNABLE TO FIND CPAP for OSA (6cm-19cm)     No facility-administered medications prior to visit.    PAST MEDICAL HISTORY: Past Medical History:  Diagnosis Date   Allergy    Arthritis    SHOULDERS   Balanitis    Chronic back pain    CKD (chronic kidney disease)    Deafness, sensorineural, childhood onset    BILATERAL SINCE AGE 35  SECONDARY TO UNKNOWN ILLNESS   GERD (gastroesophageal reflux disease)    Hyperlipidemia    Hypertension    No blood products    Patient is Jehovah Witness no blood products.   OSA on CPAP    SEVERE PER STUDY 03-09-2009   Right renal mass    Smokers' cough (HCC)    Type 2 diabetes mellitus (HCC)     PAST SURGICAL HISTORY: Past Surgical History:  Procedure Laterality Date   CIRCUMCISION N/A 12/19/2013   Procedure: CIRCUMCISION ADULT;  Surgeon: Jock Muller, MD;  Location[facility];  Service: Urology;  Laterality: N/A;   SHOULDER SURGERY Right    SHOULDER SURGERY Left 10/15/2021   WRIST SURGERY Left     FAMILY HISTORY: Family History  Problem Relation Age of Onset   Cancer Mother        type unknown pt was 38 years old at that time   Liver disease Father    Healthy Brother     Cancer Other         alot of family members have cancer but doesnt know the type   Colon cancer Neg Hx    Esophageal cancer Neg Hx    Rectal cancer Neg Hx    Stomach cancer Neg Hx    Sleep apnea Neg Hx     SOCIAL HISTORY: Social History   Socioeconomic History   Marital status: Married    Spouse name: Not on file   Number of children: 4   Years of education: Not on file   Highest education level: Not on file  Occupational History   Occupation: unemployed  Tobacco Use   Smoking status: Former    Current packs/day: 1.00    Average packs/day: 1 pack/day for 30.0 years (30.0 ttl pk-yrs)    Types: Cigarettes   Smokeless tobacco: Never   Tobacco comments:    quit 4 years ago  Vaping Use   Vaping status: Never Used  Substance and Sexual Activity   Alcohol use: Not Currently    Comment: rarely   Drug use: Not Currently   Sexual activity: Yes  Other Topics Concern   Not on file  Social History Narrative   Not on file   Social Drivers of Health   Financial Resource Strain: Low Risk  (05/06/2023)   Overall Financial Resource Strain (CARDIA)    Difficulty of Paying Living Expenses: Not hard at all  Food Insecurity: No Food Insecurity (05/06/2023)   Hunger Vital Sign    Worried About Running Out of Food in the Last Year: Never true    Ran Out of  Food in the Last Year: Never true  Transportation Needs: No Transportation Needs (05/06/2023)   PRAPARE - Administrator, Civil Service (Medical): No    Lack of Transportation (Non-Medical): No  Physical Activity: Insufficiently Active (05/06/2023)   Exercise Vital Sign    Days of Exercise per Week: 5 days    Minutes of Exercise per Session: 20 min  Stress: No Stress Concern Present (05/06/2023)   [employer] of Occupational Health - Occupational Stress Questionnaire    Feeling of Stress : Not at all  Social Connections: Moderately Integrated (05/06/2023)   Social Connection and Isolation Panel [NHANES]    Frequency  of Communication with Friends and Family: More than three times a week    Frequency of Social Gatherings with Friends and Family: More than three times a week    Attends Religious Services: More than 4 times per year    Active Member of [employer] or Organizations: No    Attends Banker Meetings: Never    Marital Status: Married  Catering manager Violence: Not At Risk (05/06/2023)   Humiliation, Afraid, Rape, and Kick questionnaire    Fear of Current or Ex-Partner: No    Emotionally Abused: No    Physically Abused: No    Sexually Abused: No      PHYSICAL EXAM  Vitals:   10/06/23 0949  BP: 108/62  Pulse: 78  Weight: 226 lb (102.5 kg)  Height: 5\' 9"  (1.753 m)      Body mass index is 33.37 kg/m.  Generalized: Well developed, in no acute distress  Chest: Lungs clear to auscultation bilaterally  Neurological examination  Mentation: Alert oriented to time, place, history taking. Follows all commands speech and language fluent Cranial nerve II-XII: Facial symmetry noted Gait and station: Gait is normal.    DIAGNOSTIC DATA (LABS, IMAGING, TESTING) - I reviewed patient records, labs, notes, testing and imaging myself where available.  Lab Results  Component Value Date   WBC 6.2 08/31/2023   HGB 13.7 08/31/2023   HCT 40.8 08/31/2023   MCV 90.5 08/31/2023   PLT 165 08/31/2023      Component Value Date/Time   NA 136 08/31/2023 1148   NA 137 05/06/2023 1148   K 4.2 08/31/2023 1148   CL 102 08/31/2023 1148   CO2 23 08/31/2023 1148   GLUCOSE 249 (H) 08/31/2023 1148   BUN 27 (H) 08/31/2023 1148   BUN 29 (H) 05/06/2023 1148   CREATININE 1.88 (H) 08/31/2023 1148   CALCIUM  9.6 08/31/2023 1148   PROT 7.2 10/26/2022 1017   ALBUMIN  4.8 10/26/2022 1017   AST 28 10/26/2022 1017   ALT 30 10/26/2022 1017   ALKPHOS 70 10/26/2022 1017   BILITOT 0.4 10/26/2022 1017   GFRNONAA 41 (L) 08/31/2023 1148   GFRAA 84 10/17/2020 1437   Lab Results  Component Value Date    CHOL 181 09/24/2023   HDL 25 (L) 09/24/2023   LDLCALC 84 09/24/2023   LDLDIRECT 89 09/24/2023   TRIG 443 (H) 09/24/2023   CHOLHDL 7.2 (H) 09/24/2023   Lab Results  Component Value Date   HGBA1C 7.2 (H) 08/31/2023   No results found for: "VITAMINB12" Lab Results  Component Value Date   TSH 2.110 10/26/2022      ASSESSMENT AND PLAN 58 y.o. year old male  has a past medical history of Allergy, Arthritis, Balanitis, Chronic back pain, CKD (chronic kidney disease), Deafness, sensorineural, childhood onset, GERD (gastroesophageal reflux disease), Hyperlipidemia, Hypertension, No blood  products, OSA on CPAP, Right renal mass, Smokers' cough (HCC), and Type 2 diabetes mellitus (HCC). here with:  Obstructive sleep apnea on CPAP Hypersomnia  Good compliance Residual AHI is is slightly elevated.  Will adjust pressure 8-19cmH20 with EPR of 3 Encouraged patient to continue using CPAP nightly greater than 4 hours each night Continue Provigil  200 mg daily for hypersomnia Follow-up in 1 year or sooner if needed    Clem Currier, MSN, NP-C 10/05/2023, 3:37 PM [facility] [address], [address] [place], [place] [address] [phone]

## 2023-10-06 ENCOUNTER — Ambulatory Visit: Payer: Medicare PPO | Admitting: Adult Health

## 2023-10-06 ENCOUNTER — Encounter: Payer: Self-pay | Admitting: Adult Health

## 2023-10-06 VITALS — BP 108/62 | HR 78 | Ht 69.0 in | Wt 226.0 lb

## 2023-10-06 DIAGNOSIS — G4733 Obstructive sleep apnea (adult) (pediatric): Secondary | ICD-10-CM

## 2023-10-06 DIAGNOSIS — G4710 Hypersomnia, unspecified: Secondary | ICD-10-CM | POA: Diagnosis not present

## 2023-10-06 NOTE — Patient Instructions (Addendum)
 Continue using CPAP nightly and greater than 4 hours each night. Will adjust pressure 8-19 cmH2O with EPR of 3 Continue Provigil  If your symptoms worsen or you develop new symptoms please let us  know.

## 2023-10-07 ENCOUNTER — Encounter: Payer: Self-pay | Admitting: Nurse Practitioner

## 2023-10-08 ENCOUNTER — Encounter: Payer: Self-pay | Admitting: Nurse Practitioner

## 2023-10-12 DIAGNOSIS — G4733 Obstructive sleep apnea (adult) (pediatric): Secondary | ICD-10-CM | POA: Diagnosis not present

## 2023-10-12 DIAGNOSIS — D49511 Neoplasm of unspecified behavior of right kidney: Secondary | ICD-10-CM | POA: Diagnosis not present

## 2023-10-12 DIAGNOSIS — N390 Urinary tract infection, site not specified: Secondary | ICD-10-CM | POA: Diagnosis not present

## 2023-10-13 ENCOUNTER — Telehealth: Payer: Self-pay

## 2023-10-13 NOTE — Progress Notes (Addendum)
Anesthesia Review:  PCP: Janece Moore,NP LOV 09/21/23  Cardiologist : Iantha Fallen Hilty LOV 09/29/23- clearance in note  Neuro- Megan Millikan LOV 10/06/23  Chest x-ray : EKG : 08/31/2023  Echo : Stress test: 05/25/23  Cardiac Cath :  Activity level: can do a flight of stairs without difficutoy  Sleep Study/ CPAP : has cpap  Fasting Blood Sugar :      / Checks Blood Sugar -- times a day:   Blood Thinner/ Instructions /Last Dose: ASA / Instructions/ Last Dose :    Deaf and so is wife     Interpreter present at time of preop appt.  On 10/14/23.    DM- type 2 - checks glucose twice daily at home  Hgba1c- 10/14/23- 7.1 - routed to Dr Liliane Shi on 10/14/23.  Hgba1c-08/31/23-7.2  Marcelline Deist- hold for 72 hours prior to procedure Last dose on 10/16/23  Ozempic-  Hold for 7 days prior to surgery Last dose on  10/07/2023    Blood Refusal  PT signed blood refusal consent form at preop   Advanced directive on chart  Also on chart Work Sheet 1 and Work Sheet 2 that pt brought in placed on chart.   Wife also present at preop appt.   PT was very adamant at preop about Woksheet 1 and 2 be with Blood Refusal Consent.  Placed on chart with name labels placed on and pt watched me place his name labels on Paperwork.   Greater than 20 minutes was spent with pt and wife discussing the blood product refusal form.   Also pt and wife adamant that only necessary blood be drawn.  Explained to pt and iwfe with interpreter that labs are necessary after surgery to monitor body chemistry and blood count.  Pt and wife voiced understanding.    PT was 25 minutes late for preop appt.     bmP DONE 10/14/23 ROUTED TO dr wINTER ON 10/14/23    Blood Product Refusal  FAxed to DR Liliane Shi along with Advanced directive and Pt's Worksheet 1 and 2 brought in by pt Faxed to Blood Bank Noted in FYI Noted in Allergies  ORder in epic.     PT would like chaplain visit.  Placed in Psychosocial.    Pt's wife- Cassandra called and LVMM and  wanted me to call and review again what meds to take am of surgery. Called and LVMM on 10/18/23 at 1152am.   PT's wife called back x 2 on 10/18/23 between 1245 and 300pm.  LVMM stating to leave her a detailed message as to what meds pt was to take am of surgery since phone was in other room.  Called wife back as soon as was completed with preop appts for 10/18/23 and  she answered phone.  Asked wife did she have copy of preop instructons given to them at time of preop appt.  Wife stated she did Reviewd with her pt is to take coreg and omeprazole day of surgery.  Asked wife if pt's last dose of Marcelline Deist was on 10/16/23 and alst dose of Ozempic on 10/07/23.  Wife stated yes.  Informed wife that she was talking to preop nurse that pt and wife had seen on 10/14/23.  Wife voiced understanding and thanked for the call.     Leticia Clas made aware of communication issues with wife and husband.

## 2023-10-13 NOTE — Telephone Encounter (Signed)
CPAP orders sent and confirmed.

## 2023-10-13 NOTE — Patient Instructions (Addendum)
SURGICAL WAITING ROOM VISITATION  Patients having surgery or a procedure may have no more than 2 support people in the waiting area - these visitors may rotate.    Children under the age of 28 must have an adult with them who is not the patient.  Due to an increase in RSV and influenza rates and associated hospitalizations, children ages 40 and under may not visit patients in [facility] hospitals.  Visitors with respiratory illnesses are discouraged from visiting and should remain at home.  If the patient needs to stay at the hospital during part of their recovery, the visitor guidelines for inpatient rooms apply. Pre-op nurse will coordinate an appropriate time for 1 support person to accompany patient in pre-op.  This support person may not rotate.    Please refer to the [facility] website for the visitor guidelines for Inpatients (after your surgery is over and you are in a regular room).       Your procedure is scheduled on:  10/20/23    Report to [facility] Main Entrance    Report to admitting at    [phone]   Call this number if you have problems the morning of surgery [phone]   Do not eat food  or drink liquids :After Midnight.                                         .               If you have questions, please contact your     Oral Hygiene is also important to reduce your risk of infection.                                    Remember - BRUSH YOUR TEETH THE MORNING OF SURGERY WITH YOUR REGULAR TOOTHPASTE  DENTURES WILL BE REMOVED PRIOR TO SURGERY PLEASE DO NOT APPLY "Poly grip" OR ADHESIVES!!!   Do NOT smoke after Midnight   Stop all vitamins and herbal supplements 7 days before surgery.   Take these medicines the morning of surgery with A SIP OF WATER:  coreg, omeprazole, flomax         Farxiga- Hold for 72 houris prior to procedure.  Last dose on  10/16/23          Ozempic- Hold for 7 days prior to procedure.  Last dose on    DO NOT TAKE ANY ORAL  DIABETIC MEDICATIONS DAY OF YOUR SURGERY  Bring CPAP mask and tubing day of surgery.                              You may not have any metal on your body including hair pins, jewelry, and body piercing             Do not wear make-up, lotions, powders, perfumes/cologne, or deodorant  Do not wear nail polish including gel and S&S, artificial/acrylic nails, or any other type of covering on natural nails including finger and toenails. If you have artificial nails, gel coating, etc. that needs to be removed by a nail salon please have this removed prior to surgery or surgery may need to be canceled/ delayed if the surgeon/ anesthesia feels like they are unable to be safely  monitored.   Do not shave  48 hours prior to surgery.               Men may shave face and neck.   Do not bring valuables to the hospital. [facility] IS NOT             RESPONSIBLE   FOR VALUABLES.   Contacts, glasses, dentures or bridgework may not be worn into surgery.   Bring small overnight bag day of surgery.   DO NOT BRING YOUR HOME MEDICATIONS TO THE HOSPITAL. PHARMACY WILL DISPENSE MEDICATIONS LISTED ON YOUR MEDICATION LIST TO YOU DURING YOUR ADMISSION IN THE HOSPITAL!    Patients discharged on the day of surgery will not be allowed to drive home.  Someone NEEDS to stay with you for the first 24 hours after anesthesia.   Special Instructions: Bring a copy of your healthcare power of attorney and living will documents the day of surgery if you haven't scanned them before.              Please read over the following fact sheets you were given: IF YOU HAVE QUESTIONS ABOUT YOUR PRE-OP INSTRUCTIONS PLEASE CALL [phone]   If you received a COVID test during your pre-op visit  it is requested that you wear a mask when out in public, stay away from anyone that may not be feeling well and notify your surgeon if you develop symptoms. If you test positive for Covid or have been in contact with anyone that has tested  positive in the last 10 days please notify you surgeon.    [facility] - Preparing for Surgery Before surgery, you can play an important role.  Because skin is not sterile, your skin needs to be as free of germs as possible.  You can reduce the number of germs on your skin by washing with CHG (chlorahexidine gluconate) soap before surgery.  CHG is an antiseptic cleaner which kills germs and bonds with the skin to continue killing germs even after washing. Please DO NOT use if you have an allergy to CHG or antibacterial soaps.  If your skin becomes reddened/irritated stop using the CHG and inform your nurse when you arrive at Short Stay. Do not shave (including legs and underarms) for at least 48 hours prior to the first CHG shower.  You may shave your face/neck. Please follow these instructions carefully:  1.  Shower with CHG Soap the night before surgery and the  morning of Surgery.  2.  If you choose to wash your hair, wash your hair first as usual with your  normal  shampoo.  3.  After you shampoo, rinse your hair and body thoroughly to remove the  shampoo.                           4.  Use CHG as you would any other liquid soap.  You can apply chg directly  to the skin and wash                       Gently with a scrungie or clean washcloth.  5.  Apply the CHG Soap to your body ONLY FROM THE NECK DOWN.   Do not use on face/ open                           Wound or open sores. Avoid  contact with eyes, ears mouth and genitals (private parts).                       Wash face,  Genitals (private parts) with your normal soap.             6.  Wash thoroughly, paying special attention to the area where your surgery  will be performed.  7.  Thoroughly rinse your body with warm water from the neck down.  8.  DO NOT shower/wash with your normal soap after using and rinsing off  the CHG Soap.                9.  Pat yourself dry with a clean towel.            10.  Wear clean pajamas.            11.  Place  clean sheets on your bed the night of your first shower and do not  sleep with pets. Day of Surgery : Do not apply any lotions/deodorants the morning of surgery.  Please wear clean clothes to the hospital/surgery center.  FAILURE TO FOLLOW THESE INSTRUCTIONS MAY RESULT IN THE CANCELLATION OF YOUR SURGERY PATIENT SIGNATURE_________________________________  NURSE SIGNATURE__________________________________  ________________________________________________________________________

## 2023-10-14 ENCOUNTER — Encounter (HOSPITAL_COMMUNITY): Payer: Self-pay

## 2023-10-14 ENCOUNTER — Other Ambulatory Visit: Payer: Self-pay

## 2023-10-14 ENCOUNTER — Encounter (HOSPITAL_COMMUNITY)
Admission: RE | Admit: 2023-10-14 | Discharge: 2023-10-14 | Disposition: A | Discharge status: Home / Self Care | Payer: Medicare PPO | Source: Ambulatory Visit | Attending: Urology | Admitting: Urology

## 2023-10-14 VITALS — BP 113/77 | HR 70 | Temp 98.1°F | Resp 16 | Ht 69.5 in | Wt 210.0 lb

## 2023-10-14 DIAGNOSIS — E1165 Type 2 diabetes mellitus with hyperglycemia: Secondary | ICD-10-CM | POA: Diagnosis not present

## 2023-10-14 DIAGNOSIS — Z01818 Encounter for other preprocedural examination: Secondary | ICD-10-CM

## 2023-10-14 DIAGNOSIS — Z01812 Encounter for preprocedural laboratory examination: Secondary | ICD-10-CM | POA: Diagnosis not present

## 2023-10-14 LAB — CBC
HCT: 43.8 % (ref 39.0–52.0)
Hemoglobin: 14.5 g/dL (ref 13.0–17.0)
MCH: 30.6 pg (ref 26.0–34.0)
MCHC: 33.1 g/dL (ref 30.0–36.0)
MCV: 92.4 fL (ref 80.0–100.0)
Platelets: 174 10*3/uL (ref 150–400)
RBC: 4.74 MIL/uL (ref 4.22–5.81)
RDW: 12.7 % (ref 11.5–15.5)
WBC: 5.5 10*3/uL (ref 4.0–10.5)
nRBC: 0 % (ref 0.0–0.2)

## 2023-10-14 LAB — HEMOGLOBIN A1C
Hgb A1c MFr Bld: 7.1 % — ABNORMAL HIGH (ref 4.8–5.6)
Mean Plasma Glucose: 157.07 mg/dL

## 2023-10-14 LAB — BASIC METABOLIC PANEL
Anion gap: 10 (ref 5–15)
BUN: 27 mg/dL — ABNORMAL HIGH (ref 6–20)
CO2: 26 mmol/L (ref 22–32)
Calcium: 9.8 mg/dL (ref 8.9–10.3)
Chloride: 102 mmol/L (ref 98–111)
Creatinine, Ser: 1.53 mg/dL — ABNORMAL HIGH (ref 0.61–1.24)
GFR, Estimated: 53 mL/min — ABNORMAL LOW (ref >60)
Glucose, Bld: 169 mg/dL — ABNORMAL HIGH (ref 70–99)
Potassium: 4.5 mmol/L (ref 3.5–5.1)
Sodium: 138 mmol/L (ref 135–145)

## 2023-10-14 LAB — GLUCOSE, CAPILLARY: Glucose-Capillary: 167 mg/dL — ABNORMAL HIGH (ref 70–99)

## 2023-10-15 LAB — NO BLOOD PRODUCTS

## 2023-10-19 ENCOUNTER — Other Ambulatory Visit (HOSPITAL_COMMUNITY): Payer: Self-pay

## 2023-10-19 NOTE — Telephone Encounter (Signed)
Med discontinued 10/01/23 - change in therapy.

## 2023-10-20 ENCOUNTER — Inpatient Hospital Stay (HOSPITAL_COMMUNITY): Payer: Medicare PPO | Admitting: Medical

## 2023-10-20 ENCOUNTER — Other Ambulatory Visit: Payer: Self-pay

## 2023-10-20 ENCOUNTER — Encounter (HOSPITAL_COMMUNITY)
Admission: RE | Disposition: A | Discharge status: Home / Self Care | Payer: Self-pay | Source: Home / Self Care | Attending: Urology

## 2023-10-20 ENCOUNTER — Encounter (HOSPITAL_COMMUNITY): Payer: Self-pay | Admitting: Urology

## 2023-10-20 ENCOUNTER — Inpatient Hospital Stay (HOSPITAL_COMMUNITY)
Admission: RE | Admit: 2023-10-20 | Discharge: 2023-10-22 | DRG: 658 | Disposition: A | Discharge status: Home / Self Care | Payer: Medicare PPO | Attending: Urology | Admitting: Urology

## 2023-10-20 ENCOUNTER — Inpatient Hospital Stay (HOSPITAL_COMMUNITY): Payer: Medicare PPO | Admitting: Registered Nurse

## 2023-10-20 DIAGNOSIS — G4733 Obstructive sleep apnea (adult) (pediatric): Secondary | ICD-10-CM | POA: Diagnosis not present

## 2023-10-20 DIAGNOSIS — E66811 Obesity, class 1: Secondary | ICD-10-CM | POA: Diagnosis present

## 2023-10-20 DIAGNOSIS — I10 Essential (primary) hypertension: Secondary | ICD-10-CM

## 2023-10-20 DIAGNOSIS — Z7982 Long term (current) use of aspirin: Secondary | ICD-10-CM

## 2023-10-20 DIAGNOSIS — E1165 Type 2 diabetes mellitus with hyperglycemia: Secondary | ICD-10-CM | POA: Diagnosis present

## 2023-10-20 DIAGNOSIS — N1831 Chronic kidney disease, stage 3a: Secondary | ICD-10-CM | POA: Diagnosis not present

## 2023-10-20 DIAGNOSIS — Z79899 Other long term (current) drug therapy: Secondary | ICD-10-CM

## 2023-10-20 DIAGNOSIS — K219 Gastro-esophageal reflux disease without esophagitis: Secondary | ICD-10-CM | POA: Diagnosis present

## 2023-10-20 DIAGNOSIS — C641 Malignant neoplasm of right kidney, except renal pelvis: Secondary | ICD-10-CM | POA: Diagnosis not present

## 2023-10-20 DIAGNOSIS — D49511 Neoplasm of unspecified behavior of right kidney: Secondary | ICD-10-CM | POA: Diagnosis not present

## 2023-10-20 DIAGNOSIS — Z23 Encounter for immunization: Secondary | ICD-10-CM

## 2023-10-20 DIAGNOSIS — I2510 Atherosclerotic heart disease of native coronary artery without angina pectoris: Secondary | ICD-10-CM | POA: Diagnosis present

## 2023-10-20 DIAGNOSIS — K7460 Unspecified cirrhosis of liver: Secondary | ICD-10-CM | POA: Diagnosis not present

## 2023-10-20 DIAGNOSIS — N2889 Other specified disorders of kidney and ureter: Secondary | ICD-10-CM | POA: Diagnosis not present

## 2023-10-20 DIAGNOSIS — H9193 Unspecified hearing loss, bilateral: Secondary | ICD-10-CM | POA: Diagnosis present

## 2023-10-20 DIAGNOSIS — I129 Hypertensive chronic kidney disease with stage 1 through stage 4 chronic kidney disease, or unspecified chronic kidney disease: Secondary | ICD-10-CM | POA: Diagnosis not present

## 2023-10-20 DIAGNOSIS — E782 Mixed hyperlipidemia: Secondary | ICD-10-CM | POA: Diagnosis present

## 2023-10-20 DIAGNOSIS — R079 Chest pain, unspecified: Secondary | ICD-10-CM | POA: Diagnosis not present

## 2023-10-20 DIAGNOSIS — I8500 Esophageal varices without bleeding: Secondary | ICD-10-CM | POA: Diagnosis present

## 2023-10-20 DIAGNOSIS — Z87891 Personal history of nicotine dependence: Secondary | ICD-10-CM

## 2023-10-20 DIAGNOSIS — R0602 Shortness of breath: Secondary | ICD-10-CM | POA: Diagnosis not present

## 2023-10-20 DIAGNOSIS — Z6836 Body mass index (BMI) 36.0-36.9, adult: Secondary | ICD-10-CM

## 2023-10-20 DIAGNOSIS — E119 Type 2 diabetes mellitus without complications: Secondary | ICD-10-CM

## 2023-10-20 DIAGNOSIS — E1140 Type 2 diabetes mellitus with diabetic neuropathy, unspecified: Secondary | ICD-10-CM | POA: Diagnosis present

## 2023-10-20 DIAGNOSIS — E1122 Type 2 diabetes mellitus with diabetic chronic kidney disease: Secondary | ICD-10-CM | POA: Diagnosis not present

## 2023-10-20 DIAGNOSIS — Z01818 Encounter for other preprocedural examination: Secondary | ICD-10-CM

## 2023-10-20 DIAGNOSIS — Z8249 Family history of ischemic heart disease and other diseases of the circulatory system: Secondary | ICD-10-CM

## 2023-10-20 DIAGNOSIS — N039 Chronic nephritic syndrome with unspecified morphologic changes: Secondary | ICD-10-CM | POA: Diagnosis not present

## 2023-10-20 DIAGNOSIS — R310 Gross hematuria: Secondary | ICD-10-CM | POA: Diagnosis not present

## 2023-10-20 HISTORY — PX: ROBOTIC ASSITED PARTIAL NEPHRECTOMY: SHX6087

## 2023-10-20 LAB — GLUCOSE, CAPILLARY
Glucose-Capillary: 137 mg/dL — ABNORMAL HIGH (ref 70–99)
Glucose-Capillary: 163 mg/dL — ABNORMAL HIGH (ref 70–99)
Glucose-Capillary: 234 mg/dL — ABNORMAL HIGH (ref 70–99)
Glucose-Capillary: 231 mg/dL — ABNORMAL HIGH (ref 70–99)
Glucose-Capillary: 210 mg/dL — ABNORMAL HIGH (ref 70–99)
Glucose-Capillary: 181 mg/dL — ABNORMAL HIGH (ref 70–99)
Glucose-Capillary: 190 mg/dL — ABNORMAL HIGH (ref 70–99)

## 2023-10-20 LAB — HEMOGLOBIN AND HEMATOCRIT, BLOOD
HCT: 36.4 % — ABNORMAL LOW (ref 39.0–52.0)
Hemoglobin: 12.3 g/dL — ABNORMAL LOW (ref 13.0–17.0)

## 2023-10-20 SURGERY — NEPHRECTOMY, PARTIAL, ROBOT-ASSISTED
Anesthesia: General | Laterality: Right

## 2023-10-20 MED ORDER — STERILE WATER FOR IRRIGATION IR SOLN
Status: DC | PRN
Start: 2023-10-20 — End: 2023-10-20
  Administered 2023-10-20: 1000 mL

## 2023-10-20 MED ORDER — ONDANSETRON HCL 4 MG/2ML IJ SOLN
INTRAMUSCULAR | Status: AC
  Filled 2023-10-20: qty 2

## 2023-10-20 MED ORDER — ACETAMINOPHEN 10 MG/ML IV SOLN: 500.0000 mg | Freq: Once | INTRAVENOUS | Status: DC | PRN

## 2023-10-20 MED ORDER — DIPHENHYDRAMINE HCL 12.5 MG/5ML PO ELIX: 12.5000 mg | ORAL_SOLUTION | Freq: Four times a day (QID) | ORAL | Status: DC | PRN

## 2023-10-20 MED ORDER — HYDROCODONE-ACETAMINOPHEN 5-325 MG PO TABS: 1.0000 | ORAL_TABLET | Freq: Four times a day (QID) | ORAL | 0 refills | Status: AC | PRN

## 2023-10-20 MED ORDER — CEFAZOLIN SODIUM-DEXTROSE 2-4 GM/100ML-% IV SOLN
2.0000 g | INTRAVENOUS | Status: AC
  Administered 2023-10-20: 2 g via INTRAVENOUS
  Filled 2023-10-20: qty 100

## 2023-10-20 MED ORDER — DEXAMETHASONE SODIUM PHOSPHATE 10 MG/ML IJ SOLN
INTRAMUSCULAR | Status: DC | PRN
  Administered 2023-10-20: 5 mg via INTRAVENOUS

## 2023-10-20 MED ORDER — MODAFINIL 200 MG PO TABS
200.0000 mg | ORAL_TABLET | Freq: Every day | ORAL | Status: DC
  Administered 2023-10-21 – 2023-10-22 (×2): 200 mg via ORAL
  Filled 2023-10-20 (×2): qty 1

## 2023-10-20 MED ORDER — VISTASEAL 4 ML SINGLE DOSE KIT
4.0000 mL | PACK | Freq: Once | CUTANEOUS | Status: DC
  Filled 2023-10-20: qty 4

## 2023-10-20 MED ORDER — ACETAMINOPHEN 10 MG/ML IV SOLN
1000.0000 mg | Freq: Four times a day (QID) | INTRAVENOUS | Status: DC
  Administered 2023-10-20 – 2023-10-21 (×3): 1000 mg via INTRAVENOUS
  Filled 2023-10-20 (×3): qty 100

## 2023-10-20 MED ORDER — ROCURONIUM BROMIDE 10 MG/ML (PF) SYRINGE
PREFILLED_SYRINGE | INTRAVENOUS | Status: DC | PRN
  Administered 2023-10-20 (×2): 20 mg via INTRAVENOUS
  Administered 2023-10-20: 60 mg via INTRAVENOUS
  Administered 2023-10-20: 20 mg via INTRAVENOUS

## 2023-10-20 MED ORDER — TRIPLE ANTIBIOTIC 3.5-400-5000 EX OINT: 1.0000 | TOPICAL_OINTMENT | Freq: Three times a day (TID) | CUTANEOUS | Status: DC | PRN

## 2023-10-20 MED ORDER — LIDOCAINE 2% (20 MG/ML) 5 ML SYRINGE
INTRAMUSCULAR | Status: DC | PRN
  Administered 2023-10-20: 60 mg via INTRAVENOUS
  Administered 2023-10-20: 1.5 mg/kg/h via INTRAVENOUS

## 2023-10-20 MED ORDER — PROPOFOL 10 MG/ML IV BOLUS
INTRAVENOUS | Status: AC
  Filled 2023-10-20: qty 20

## 2023-10-20 MED ORDER — PHENYLEPHRINE HCL-NACL 20-0.9 MG/250ML-% IV SOLN
INTRAVENOUS | Status: DC | PRN
  Administered 2023-10-20: 20 ug/min via INTRAVENOUS

## 2023-10-20 MED ORDER — FENTANYL CITRATE PF 50 MCG/ML IJ SOSY
PREFILLED_SYRINGE | INTRAMUSCULAR | Status: AC
  Filled 2023-10-20: qty 2

## 2023-10-20 MED ORDER — FENTANYL CITRATE (PF) 100 MCG/2ML IJ SOLN
INTRAMUSCULAR | Status: AC
  Filled 2023-10-20: qty 2

## 2023-10-20 MED ORDER — OXYCODONE HCL 5 MG PO TABS
5.0000 mg | ORAL_TABLET | ORAL | Status: DC | PRN
  Administered 2023-10-20 – 2023-10-21 (×5): 5 mg via ORAL
  Filled 2023-10-20 (×5): qty 1

## 2023-10-20 MED ORDER — MIDAZOLAM HCL 5 MG/5ML IJ SOLN
INTRAMUSCULAR | Status: DC | PRN
  Administered 2023-10-20: 2 mg via INTRAVENOUS

## 2023-10-20 MED ORDER — AMISULPRIDE (ANTIEMETIC) 5 MG/2ML IV SOLN
10.0000 mg | Freq: Once | INTRAVENOUS | Status: AC | PRN
  Administered 2023-10-20: 10 mg via INTRAVENOUS

## 2023-10-20 MED ORDER — FENTANYL CITRATE PF 50 MCG/ML IJ SOSY
25.0000 ug | PREFILLED_SYRINGE | INTRAMUSCULAR | Status: DC | PRN
  Administered 2023-10-20 (×3): 50 ug via INTRAVENOUS

## 2023-10-20 MED ORDER — AMISULPRIDE (ANTIEMETIC) 5 MG/2ML IV SOLN
INTRAVENOUS | Status: AC
  Filled 2023-10-20: qty 4

## 2023-10-20 MED ORDER — DOCUSATE SODIUM 100 MG PO CAPS: 100.0000 mg | ORAL_CAPSULE | Freq: Two times a day (BID) | ORAL | Status: AC

## 2023-10-20 MED ORDER — EZETIMIBE 10 MG PO TABS
10.0000 mg | ORAL_TABLET | Freq: Every day | ORAL | Status: DC
  Administered 2023-10-20 – 2023-10-22 (×3): 10 mg via ORAL
  Filled 2023-10-20 (×3): qty 1

## 2023-10-20 MED ORDER — PROPOFOL 10 MG/ML IV BOLUS
INTRAVENOUS | Status: DC | PRN
  Administered 2023-10-20: 200 mg via INTRAVENOUS

## 2023-10-20 MED ORDER — HYDROMORPHONE HCL 1 MG/ML IJ SOLN
INTRAMUSCULAR | Status: AC
  Filled 2023-10-20: qty 1

## 2023-10-20 MED ORDER — INSULIN ASPART 100 UNIT/ML IJ SOLN
0.0000 [IU] | INTRAMUSCULAR | Status: AC | PRN
  Administered 2023-10-20: 2 [IU] via SUBCUTANEOUS
  Administered 2023-10-20: 6 [IU] via SUBCUTANEOUS

## 2023-10-20 MED ORDER — BUPIVACAINE LIPOSOME 1.3 % IJ SUSP
INTRAMUSCULAR | Status: DC | PRN
  Administered 2023-10-20: 20 mL

## 2023-10-20 MED ORDER — EPHEDRINE SULFATE-NACL 50-0.9 MG/10ML-% IV SOSY
PREFILLED_SYRINGE | INTRAVENOUS | Status: DC | PRN
  Administered 2023-10-20: 5 mg via INTRAVENOUS

## 2023-10-20 MED ORDER — ACETAMINOPHEN 10 MG/ML IV SOLN
INTRAVENOUS | Status: DC | PRN
  Administered 2023-10-20: 1000 mg via INTRAVENOUS

## 2023-10-20 MED ORDER — CHLORHEXIDINE GLUCONATE 0.12 % MT SOLN
15.0000 mL | Freq: Once | OROMUCOSAL | Status: AC
Start: 2023-10-20 — End: 2023-10-20
  Administered 2023-10-20: 15 mL via OROMUCOSAL

## 2023-10-20 MED ORDER — ONDANSETRON HCL 4 MG/2ML IJ SOLN: 4.0000 mg | INTRAMUSCULAR | Status: DC | PRN

## 2023-10-20 MED ORDER — SODIUM CHLORIDE (PF) 0.9 % IJ SOLN
INTRAMUSCULAR | Status: DC | PRN
  Administered 2023-10-20: 20 mL

## 2023-10-20 MED ORDER — INSULIN ASPART 100 UNIT/ML IJ SOLN
0.0000 [IU] | Freq: Three times a day (TID) | INTRAMUSCULAR | Status: DC
  Administered 2023-10-20: 3 [IU] via SUBCUTANEOUS
  Administered 2023-10-21: 8 [IU] via SUBCUTANEOUS
  Administered 2023-10-21: 3 [IU] via SUBCUTANEOUS
  Administered 2023-10-21: 11 [IU] via SUBCUTANEOUS
  Administered 2023-10-22: 3 [IU] via SUBCUTANEOUS
  Administered 2023-10-22: 8 [IU] via SUBCUTANEOUS

## 2023-10-20 MED ORDER — OXYCODONE HCL 5 MG/5ML PO SOLN: 5.0000 mg | Freq: Once | ORAL | Status: AC | PRN

## 2023-10-20 MED ORDER — ATORVASTATIN CALCIUM 40 MG PO TABS
40.0000 mg | ORAL_TABLET | Freq: Every day | ORAL | Status: DC
Start: 2023-10-20 — End: 2023-10-22
  Administered 2023-10-20 – 2023-10-21 (×2): 40 mg via ORAL
  Filled 2023-10-20 (×2): qty 1

## 2023-10-20 MED ORDER — PNEUMOCOCCAL 20-VAL CONJ VACC 0.5 ML IM SUSY
0.5000 mL | PREFILLED_SYRINGE | INTRAMUSCULAR | Status: AC
  Administered 2023-10-21: 0.5 mL via INTRAMUSCULAR
  Filled 2023-10-20: qty 0.5

## 2023-10-20 MED ORDER — ONDANSETRON HCL 4 MG/2ML IJ SOLN
INTRAMUSCULAR | Status: DC | PRN
  Administered 2023-10-20: 4 mg via INTRAVENOUS

## 2023-10-20 MED ORDER — FENTANYL CITRATE PF 50 MCG/ML IJ SOSY
PREFILLED_SYRINGE | INTRAMUSCULAR | Status: AC
  Filled 2023-10-20: qty 1

## 2023-10-20 MED ORDER — VISTASEAL 4 ML SINGLE DOSE KIT
PACK | CUTANEOUS | Status: DC | PRN
  Administered 2023-10-20: 4 mL via TOPICAL

## 2023-10-20 MED ORDER — INSULIN ASPART 100 UNIT/ML IJ SOLN
INTRAMUSCULAR | Status: AC
  Filled 2023-10-20: qty 1

## 2023-10-20 MED ORDER — OXYCODONE HCL 5 MG PO TABS
ORAL_TABLET | ORAL | Status: AC
  Filled 2023-10-20: qty 1

## 2023-10-20 MED ORDER — DIPHENHYDRAMINE HCL 50 MG/ML IJ SOLN
12.5000 mg | Freq: Four times a day (QID) | INTRAMUSCULAR | Status: DC | PRN
Start: 2023-10-20 — End: 2023-10-22

## 2023-10-20 MED ORDER — LIDOCAINE HCL 2 % IJ SOLN
INTRAMUSCULAR | Status: AC
  Filled 2023-10-20: qty 20

## 2023-10-20 MED ORDER — TAMSULOSIN HCL 0.4 MG PO CAPS
0.4000 mg | ORAL_CAPSULE | Freq: Every day | ORAL | Status: DC
  Administered 2023-10-20 – 2023-10-22 (×3): 0.4 mg via ORAL
  Filled 2023-10-20 (×3): qty 1

## 2023-10-20 MED ORDER — ROCURONIUM BROMIDE 10 MG/ML (PF) SYRINGE
PREFILLED_SYRINGE | INTRAVENOUS | Status: AC
  Filled 2023-10-20: qty 10

## 2023-10-20 MED ORDER — HYDROMORPHONE HCL 1 MG/ML IJ SOLN
0.5000 mg | INTRAMUSCULAR | Status: DC | PRN
  Administered 2023-10-20 (×2): 0.5 mg via INTRAVENOUS

## 2023-10-20 MED ORDER — EPHEDRINE 5 MG/ML INJ
INTRAVENOUS | Status: AC
  Filled 2023-10-20: qty 5

## 2023-10-20 MED ORDER — CARVEDILOL 3.125 MG PO TABS
3.1250 mg | ORAL_TABLET | Freq: Two times a day (BID) | ORAL | Status: DC
  Administered 2023-10-21: 3.125 mg via ORAL
  Filled 2023-10-20 (×4): qty 1

## 2023-10-20 MED ORDER — MENTHOL 3 MG MT LOZG
1.0000 | LOZENGE | OROMUCOSAL | Status: DC | PRN
  Administered 2023-10-20: 3 mg via ORAL
  Filled 2023-10-20 (×2): qty 9

## 2023-10-20 MED ORDER — PANTOPRAZOLE SODIUM 40 MG PO TBEC
40.0000 mg | DELAYED_RELEASE_TABLET | Freq: Every day | ORAL | Status: DC
  Administered 2023-10-21 – 2023-10-22 (×2): 40 mg via ORAL
  Filled 2023-10-20 (×2): qty 1

## 2023-10-20 MED ORDER — SODIUM CHLORIDE (PF) 0.9 % IJ SOLN
INTRAMUSCULAR | Status: AC
  Filled 2023-10-20: qty 20

## 2023-10-20 MED ORDER — LACTATED RINGERS IV SOLN: INTRAVENOUS | Status: DC

## 2023-10-20 MED ORDER — FENTANYL CITRATE (PF) 100 MCG/2ML IJ SOLN
INTRAMUSCULAR | Status: DC | PRN
  Administered 2023-10-20 (×3): 25 ug via INTRAVENOUS
  Administered 2023-10-20: 100 ug via INTRAVENOUS

## 2023-10-20 MED ORDER — SODIUM CHLORIDE 0.45 % IV SOLN: INTRAVENOUS | Status: AC

## 2023-10-20 MED ORDER — SUGAMMADEX SODIUM 200 MG/2ML IV SOLN
INTRAVENOUS | Status: DC | PRN
  Administered 2023-10-20: 200 mg via INTRAVENOUS

## 2023-10-20 MED ORDER — HYDROMORPHONE HCL 1 MG/ML IJ SOLN
0.5000 mg | INTRAMUSCULAR | Status: DC | PRN
  Administered 2023-10-20: 0.5 mg via INTRAVENOUS
  Filled 2023-10-20: qty 1

## 2023-10-20 MED ORDER — ORAL CARE MOUTH RINSE: 15.0000 mL | Freq: Once | OROMUCOSAL | Status: AC

## 2023-10-20 MED ORDER — MIDAZOLAM HCL 2 MG/2ML IJ SOLN
INTRAMUSCULAR | Status: AC
  Filled 2023-10-20: qty 2

## 2023-10-20 MED ORDER — ICOSAPENT ETHYL 1 G PO CAPS
1.0000 g | ORAL_CAPSULE | Freq: Two times a day (BID) | ORAL | Status: DC
  Administered 2023-10-20 – 2023-10-22 (×4): 1 g via ORAL
  Filled 2023-10-20 (×4): qty 1

## 2023-10-20 MED ORDER — OXYCODONE HCL 5 MG PO TABS
5.0000 mg | ORAL_TABLET | Freq: Once | ORAL | Status: AC | PRN
  Administered 2023-10-20: 5 mg via ORAL

## 2023-10-20 MED ORDER — ACETAMINOPHEN 10 MG/ML IV SOLN
INTRAVENOUS | Status: AC
  Filled 2023-10-20: qty 100

## 2023-10-20 MED ORDER — DOCUSATE SODIUM 100 MG PO CAPS
100.0000 mg | ORAL_CAPSULE | Freq: Two times a day (BID) | ORAL | Status: DC
  Administered 2023-10-20 – 2023-10-22 (×4): 100 mg via ORAL
  Filled 2023-10-20 (×4): qty 1

## 2023-10-20 MED ORDER — DEXAMETHASONE SODIUM PHOSPHATE 10 MG/ML IJ SOLN
INTRAMUSCULAR | Status: AC
  Filled 2023-10-20: qty 1

## 2023-10-20 MED ORDER — HYOSCYAMINE SULFATE 0.125 MG SL SUBL: 0.1250 mg | SUBLINGUAL_TABLET | SUBLINGUAL | Status: DC | PRN

## 2023-10-20 MED ORDER — BUPIVACAINE LIPOSOME 1.3 % IJ SUSP
INTRAMUSCULAR | Status: AC
  Filled 2023-10-20: qty 20

## 2023-10-20 MED ORDER — INSULIN ASPART 100 UNIT/ML IJ SOLN
6.0000 [IU] | Freq: Once | INTRAMUSCULAR | Status: AC
  Administered 2023-10-20: 6 [IU] via SUBCUTANEOUS

## 2023-10-20 SURGICAL SUPPLY — 73 items
APPLICATOR SURGIFLO ENDO (HEMOSTASIS) IMPLANT
APPLICATOR VISTASEAL 35 (MISCELLANEOUS) ×1 IMPLANT
BAG COUNTER SPONGE SURGICOUNT (BAG) IMPLANT
BAG LAPAROSCOPIC 12 15 PORT 16 (BASKET) IMPLANT
BAG RETRIEVAL 12/15 (BASKET) ×1
CAUTERY HOOK MNPLR 1.6 DVNC XI (INSTRUMENTS) ×1 IMPLANT
CHLORAPREP W/TINT 26 (MISCELLANEOUS) ×1 IMPLANT
CLIP LIGATING HEM O LOK PURPLE (MISCELLANEOUS) ×2 IMPLANT
CLIP LIGATING HEMO LOK XL GOLD (MISCELLANEOUS) IMPLANT
CLIP LIGATING HEMO O LOK GREEN (MISCELLANEOUS) IMPLANT
CLIP SUT LAPRA TY ABSORB (SUTURE) ×2 IMPLANT
COVER SURGICAL LIGHT HANDLE (MISCELLANEOUS) ×1 IMPLANT
COVER TIP SHEARS 8 DVNC (MISCELLANEOUS) ×1 IMPLANT
DERMABOND ADVANCED .7 DNX12 (GAUZE/BANDAGES/DRESSINGS) ×1 IMPLANT
DRAIN CHANNEL 15F RND FF 3/16 (WOUND CARE) IMPLANT
DRAPE ARM DVNC X/XI (DISPOSABLE) ×4 IMPLANT
DRAPE COLUMN DVNC XI (DISPOSABLE) ×1 IMPLANT
DRAPE INCISE IOBAN 66X45 STRL (DRAPES) ×1 IMPLANT
DRAPE SHEET LG 3/4 BI-LAMINATE (DRAPES) ×1 IMPLANT
DRIVER NDL LRG 8 DVNC XI (INSTRUMENTS) ×3 IMPLANT
DRIVER NDLE LRG 8 DVNC XI (INSTRUMENTS) ×3
DRSG TEGADERM 4X4.75 (GAUZE/BANDAGES/DRESSINGS) IMPLANT
ELECT PENCIL ROCKER SW 15FT (MISCELLANEOUS) ×1 IMPLANT
ELECT REM PT RETURN 15FT ADLT (MISCELLANEOUS) ×1 IMPLANT
EVACUATOR SILICONE 100CC (DRAIN) IMPLANT
FORCEPS PROGRASP DVNC XI (FORCEP) ×2 IMPLANT
GAUZE 4X4 16PLY ~~LOC~~+RFID DBL (SPONGE) IMPLANT
GLOVE BIO SURGEON STRL SZ 6.5 (GLOVE) ×1 IMPLANT
GLOVE BIOGEL PI IND STRL 8 (GLOVE) ×1 IMPLANT
GLOVE SURG LX STRL 7.5 STRW (GLOVE) ×2 IMPLANT
GOWN STRL REUS W/ TWL XL LVL3 (GOWN DISPOSABLE) ×2 IMPLANT
GOWN STRL SURGICAL XL XLNG (GOWN DISPOSABLE) ×1 IMPLANT
HEMOSTAT SURGICEL 4X8 (HEMOSTASIS) ×1 IMPLANT
HOLDER FOLEY CATH W/STRAP (MISCELLANEOUS) ×1 IMPLANT
IRRIG SUCT STRYKERFLOW 2 WTIP (MISCELLANEOUS) ×1
IRRIGATION SUCT STRKRFLW 2 WTP (MISCELLANEOUS) ×1 IMPLANT
KIT BASIN OR (CUSTOM PROCEDURE TRAY) ×1 IMPLANT
KIT TURNOVER KIT A (KITS) IMPLANT
LOOP VESSEL MAXI BLUE (MISCELLANEOUS) IMPLANT
MARKER SKIN DUAL TIP RULER LAB (MISCELLANEOUS) ×1 IMPLANT
NDL INSUFFLATION 14GA 120MM (NEEDLE) ×1 IMPLANT
NEEDLE INSUFFLATION 14GA 120MM (NEEDLE) ×1
PROTECTOR NERVE ULNAR (MISCELLANEOUS) ×2 IMPLANT
RELOAD STAPLE 45 2.6 WHT THIN (STAPLE) IMPLANT
SCISSORS LAP 5X45 EPIX DISP (ENDOMECHANICALS) ×1 IMPLANT
SCISSORS MNPLR CVD DVNC XI (INSTRUMENTS) ×1 IMPLANT
SEAL UNIV 5-12 XI (MISCELLANEOUS) ×4 IMPLANT
SET TUBE SMOKE EVAC HIGH FLOW (TUBING) ×1 IMPLANT
SOL ELECTROSURG ANTI STICK (MISCELLANEOUS) ×1
SOLUTION ELECTROSURG ANTI STCK (MISCELLANEOUS) ×1 IMPLANT
SPIKE FLUID TRANSFER (MISCELLANEOUS) ×1 IMPLANT
SPONGE DRAIN TRACH 4X4 STRL 2S (GAUZE/BANDAGES/DRESSINGS) IMPLANT
STAPLE RELOAD 45 WHT (STAPLE)
STAPLER POWER ECHELON 45 WIDE (STAPLE) IMPLANT
SURGIFLO W/THROMBIN 8M KIT (HEMOSTASIS) IMPLANT
SUT ETHILON 2 0 PS N (SUTURE) IMPLANT
SUT MNCRL AB 4-0 PS2 18 (SUTURE) ×2 IMPLANT
SUT PDS AB 0 CT1 36 (SUTURE) IMPLANT
SUT V-LOC BARB 180 2/0GR6 GS22 (SUTURE)
SUT VIC AB 1 CT1 36 (SUTURE) ×4 IMPLANT
SUT VICRYL 0 UR6 27IN ABS (SUTURE) IMPLANT
SUT VLOC 3-0 9IN GRN (SUTURE) ×1 IMPLANT
SUT VLOC BARB 180 ABS3/0GR12 (SUTURE) ×1
SUTURE V-LC BRB 180 2/0GR6GS22 (SUTURE) IMPLANT
SUTURE VLOC BRB 180 ABS3/0GR12 (SUTURE) ×1 IMPLANT
SYS BAG RETRIEVAL 10MM (BASKET)
SYSTEM BAG RETRIEVAL 10MM (BASKET) ×1 IMPLANT
TOWEL OR 17X26 10 PK STRL BLUE (TOWEL DISPOSABLE) ×1 IMPLANT
TRAY FOLEY MTR SLVR 16FR STAT (SET/KITS/TRAYS/PACK) ×1 IMPLANT
TRAY LAPAROSCOPIC (CUSTOM PROCEDURE TRAY) ×1 IMPLANT
TROCAR Z THREAD OPTICAL 12X100 (TROCAR) ×1 IMPLANT
TROCAR Z-THREAD OPTICAL 5X100M (TROCAR) IMPLANT
WATER STERILE IRR 1000ML POUR (IV SOLUTION) ×1 IMPLANT

## 2023-10-20 NOTE — H&P (Signed)
Office Visit Report     10/12/2023   --------------------------------------------------------------------------------   Stephen Chase  MRN: [id number]  DOB: [birth year], 58 year old Male  PRIMARY CARE:  [name], PA  PRIMARY CARE FAX:  [phone]  REFERRING:  Caralyn Guile. Ethelene Hal, MD  PROVIDER:  Rhoderick Moody, M.D.  TREATING:  Anne Fu, NP  LOCATION:  [facility] [phone]     --------------------------------------------------------------------------------   CC/HPI: 10/12/2023: Pt presents today for pre-operative history and physical exam in anticipation of robotic assisted lap right partial nephrectomy by Dr. Liliane Shi on 10/20/2023. Preoperative laboratory testing with the hospital occurs in 2 days time.   Pt and wife are both deaf and an interpreter was used throughout the discussion. He is doing well and is without complaint.   Received cardiac clearance and ok to stop ASA 5-7 days pre-op. There was recent adjustments to his medications decreasing his carvedilol dose and adding ezetimibe.   Pt denies F/C, HA, CP, SOB, N/V, diarrhea/constipation, back pain, flank pain, hematuria, and dysuria.     HX:   Right renal mass   Stephen Chase is a 58 year old male with a 3.8 x 4.5 cm solid and enhancing right renal mass with features concerning for renal cell carcinoma. The mass was initially discovered on CT L-spine from 03/15/2023 due to ongoing lumbar radiculopathy and further characterized via CT abdomen with and without contrast on 06/04/2023.   -Pt is hearing impaired  -Anatomy: Exophytic, posterior right upper pole mass--early branching of right renal artery, single right renal vein. No abnormalities are seen involving the left kidney. Hepatomegaly noted  -CT chest from March showed no pulmonary mets  -Personal/family history of GU malignancies: None  -Smoking history: Previously smoked 1 ppd for 30 years  -Prior abdominal surgeries: none  -Renal function:  History of CKD 3. His last serum creatinine was 1.51 and estimated GFR was 54--from BMP on 05/06/2023  -History of kidney stones: denies  -Hx of cirrhosis, DM2  -J[employer] and refuses blood transfusions   Cardiologist: Zoila Shutter, MD     ALLERGIES: No Allergies    MEDICATIONS: Omeprazole 10 mg capsule,delayed release  Tamsulosin Hcl 0.4 mg capsule  Aspirin Ec 81 mg tablet, delayed release  Atorvastatin Calcium 40 mg tablet  Carvedilol 3.125 mg tablet 1 tablet PO Daily  Cyclobenzaprine Hcl  Ezetimibe 10 mg tablet 1 tablet PO Daily  Farxiga 10 mg tablet 1 tablet PO Daily  Lidocaine Hcl 4 % solution, oral  Magnesium 250 mg capsule  Multivitamins  Naproxen 375 mg tablet  Olmesartan-Amlodipine-Hctz 40 mg-5 mg-25 mg tablet  Ozempic 2 mg/0.75 ml (8 mg/3 ml) pen injector  Provigil 200 mg tablet  Tizanidine Hcl 4 mg capsule  Tricor 145 mg tablet  Vascepa 1 gram capsule  Vitamin B12  Vitamin D3  Zetia 10 mg tablet Oral     GU PSH: Non-Newborn Circumcision - 2015       PSH Notes: Circumcision No Clamp/Device/Dorsal Slit Older Than 28 Days, No Surgical Problems   bilateral shoulder surgery   left wrist    NON-GU PSH: No Non-GU PSH    GU PMH: Right renal neoplasm - 08/31/2023, - 06/30/2023 Chronic kidney disease stage 3 (GFR 30-60) - 06/30/2023 Balanitis, Balanitis - 2015      PMH Notes: Cardiovascular and Mediastinum:  Esophageal varices without bleeding (HCC)  Essential hypertension   Mild CAD on ASA   Respiratory:  OSA on CPAP   Digestive:  Other dysphagia  Other cirrhosis of  liver (HCC)  Esophageal dysphagia   Endocrine:  Type 2 diabetes mellitus with diabetic neuropathy, without long-term current use of insulin (HCC)  Uncontrolled type 2 diabetes mellitus with hyperglycemia (HCC)   Nervous and Auditory:  Bilateral deafness   Other:  Hypersomnia, persistent  Sore throat  Lumbar pain  Mixed hyperlipidemia  Class 1 obesity due to excess  calories with serious comorbidity and body mass index (BMI) of 34.0 to 34.9 in adult  Fall  Left hip pain     NON-GU PMH: Encounter for general adult medical examination without abnormal findings, Encounter for preventive health examination - 2015 Diabetes Type 2, Diabetes mellitus - 2015 Personal history of other diseases of the circulatory system, History of hypertension - 2015 Personal history of other diseases of the nervous system and sense organs, History of sleep apnea - 2015 Personal history of other endocrine, nutritional and metabolic disease, History of hypercholesterolemia - 2015 Unspecified hearing loss, bilateral    FAMILY HISTORY: malignant neoplasm - Runs In Family Myocardial Infarction - Runs In Family   SOCIAL HISTORY: Marital Status: Married Preferred Language: English; Ethnicity: ; Race: Other Race Current Smoking Status: Patient does not smoke anymore.   Tobacco Use Assessment Completed: Used Tobacco in last 30 days? Does not use smokeless tobacco. Has never drank.  Does not use drugs. Drinks 1 caffeinated drink per day. Has not had a blood transfusion.     Notes: Father deceased, Caffeine use, Current smoker, Mother deceased, Alcohol use, Married   No blood products    REVIEW OF SYSTEMS:    GU Review Male:   Patient denies frequent urination, hard to postpone urination, burning/ pain with urination, get up at night to urinate, leakage of urine, stream starts and stops, trouble starting your stream, have to strain to urinate , erection problems, and penile pain.  Gastrointestinal (Upper):   Patient denies nausea, vomiting, and indigestion/ heartburn.  Gastrointestinal (Lower):   Patient denies diarrhea and constipation.  Constitutional:   Patient denies fever, night sweats, weight loss, and fatigue.  Skin:   Patient denies skin rash/ lesion and itching.  Eyes:   Patient denies blurred vision and double vision.  Ears/ Nose/ Throat:   Patient denies sore  throat and sinus problems.  Hematologic/Lymphatic:   Patient denies swollen glands and easy bruising.  Cardiovascular:   Patient denies leg swelling and chest pains.  Respiratory:   Patient denies cough and shortness of breath.  Endocrine:   Patient denies excessive thirst.  Musculoskeletal:   Patient denies back pain and joint pain.  Neurological:   Patient denies dizziness and headaches.  Psychologic:   Patient denies depression and anxiety.   VITAL SIGNS:      10/12/2023 01:09 PM  Weight 220 lb / 99.79 kg  Height 65 in / 165.1 cm  BP 110/58 mmHg  Pulse 73 /min  BMI 36.6 kg/m   MULTI-SYSTEM PHYSICAL EXAMINATION:    Constitutional: Well-nourished. No physical deformities. Normally developed. Good grooming.  Neck: Neck symmetrical, not swollen. Normal tracheal position.  Respiratory: Normal breath sounds. No labored breathing, no use of accessory muscles.   Cardiovascular: Regular rate and rhythm. No murmur, no gallop.   Skin: No paleness, no jaundice, no cyanosis. No lesion, no ulcer, no rash.  Neurologic / Psychiatric: Oriented to time, oriented to place, oriented to person. No depression, no anxiety, no agitation.  Gastrointestinal: No mass, no tenderness, no rigidity, non obese abdomen.  Musculoskeletal: Normal gait and station of head and neck.  Complexity of Data:  Source Of History:  Patient, Family/Caregiver, Medical Record Summary  Lab Test Review:   BMP  Records Review:   Previous Doctor Records, Previous Hospital Records, Previous Patient Records  Urine Test Review:   Urinalysis  X-Ray Review: C.T. Abdomen/Pelvis: Reviewed Films. Reviewed Report.  C.T. Chest: Reviewed Report.     10/12/23  Urinalysis  Urine Appearance Clear   Urine Color Yellow   Urine Glucose 3+ mg/dL  Urine Bilirubin Neg mg/dL  Urine Ketones Neg mg/dL  Urine Specific Gravity 1.015   Urine Blood Neg ery/uL  Urine pH <=5.0   Urine Protein Neg mg/dL  Urine Urobilinogen 0.2 mg/dL  Urine  Nitrites Neg   Urine Leukocyte Esterase Neg leu/uL   PROCEDURES:          Visit Complexity - G2211          Urinalysis Dipstick Dipstick Cont'd  Color: Yellow Bilirubin: Neg mg/dL  Appearance: Clear Ketones: Neg mg/dL  Specific Gravity: 2.440 Blood: Neg ery/uL  pH: <=5.0 Protein: Neg mg/dL  Glucose: 3+ mg/dL Urobilinogen: 0.2 mg/dL    Nitrites: Neg    Leukocyte Esterase: Neg leu/uL    ASSESSMENT:      ICD-10 Details  1 GU:   Right renal neoplasm - D49.511 Chronic, Threat to Bodily Function  2 NON-GU:   Encounter for other preprocedural examination - Z01.818 Undiagnosed New Problem   PLAN:           Orders Labs Urine Culture          Schedule Return Visit/Planned Activity: Keep Scheduled Appointment - Follow up MD, Schedule Surgery          Document Letter(s):  Created for Patient: Clinical Summary         Notes:   All questions answered to the best my ability regarding the upcoming surgery and its expected peri-/postoperative course with understanding expressed by patient and family are present today. Urine culture sent today to serve as a baseline. He will proceed with previously scheduled robotic right partial nephrectomy with Dr. Liliane Shi on 1/29.   -I personally reviewed imaging results and films with the patient. We discussed that the mass in question has features concerning for malignancy. I explained the natural history of presumed renal cell carcinoma. I reviewed the AUA guidelines for evaluation and treatment of the small renal mass. The options of active surveillance, in situ tumor ablation, partial and radical nephrectomy was discussed. The risks of robot-assisted RIGHT partial nephrectomy were discussed in detail including but not limited to: negative pathology, open conversion, completion nephrectomy, infection of the urinary tract/skin/abdominal cavity, VTE, MI/CVA, lymphatic leak, injury to adjacent solid/hollow viscus organs, bleeding requiring a blood  transfusion, catastrophic bleeding, hernia formation, need for postoperative angioembolization, urinary leak requiring stent/drain, and other imponderables.

## 2023-10-20 NOTE — Anesthesia Procedure Notes (Signed)
Procedure Name: Intubation Date/Time: 10/20/2023 7:51 AM  Performed by: Elisabeth Cara, CRNAPre-anesthesia Checklist: Patient identified, Emergency Drugs available, Suction available, Patient being monitored and Timeout performed Patient Re-evaluated:Patient Re-evaluated prior to induction Oxygen Delivery Method: Circle system utilized Preoxygenation: Pre-oxygenation with 100% oxygen Induction Type: IV induction Ventilation: Oral airway inserted - appropriate to patient size and Two handed mask ventilation required Laryngoscope Size: Mac and 4 Grade View: Grade I Tube type: Oral Tube size: 7.5 mm Number of attempts: 1 Airway Equipment and Method: Stylet Placement Confirmation: ETT inserted through vocal cords under direct vision, positive ETCO2 and breath sounds checked- equal and bilateral Secured at: 22 cm Tube secured with: Tape Dental Injury: Teeth and Oropharynx as per pre-operative assessment

## 2023-10-20 NOTE — Progress Notes (Addendum)
Post-op note  Subjective: The patient is doing well.  No complaints.  Had some nausea but it improving. Some pain in incisions but that is also improving.   Hg stable.   Objective: Vital signs in last 24 hours: Temp:  [98.3 F (36.8 C)-98.6 F (37 C)] 98.4 F (36.9 C) (01/29 1245) Pulse Rate:  [49-74] 74 (01/29 1252) Resp:  [12-18] 17 (01/29 1252) BP: (103-131)/(62-80) 124/63 (01/29 1233) SpO2:  [92 %-100 %] 94 % (01/29 1252) Weight:  [95 kg] 95 kg (01/29 0603)  Intake/Output from previous day: No intake/output data recorded. Intake/Output this shift: Total I/O In: 1967.2 [I.V.:1767.2; IV Piggyback:200] Out: 650 [Urine:450; Blood:200]  Physical Exam:  General: Alert and oriented. Abdomen: Soft, Nondistended. Incisions: Clean and dry. GU: Foley with clear urine.   Lab Results: Recent Labs    10/20/23 1154  HGB 13.0  HCT 39.8    Assessment/Plan: POD#0   1) Continue to monitor   LOS: 0 days   [place] 10/20/2023, 1:01 PM

## 2023-10-20 NOTE — Discharge Instructions (Addendum)
Activity:  You are encouraged to ambulate frequently (about every hour during waking hours) to help prevent blood clots from forming in your legs or lungs.  However, you should not engage in any heavy lifting (> 10-15 lbs), strenuous activity, or straining. Diet: You should advance your diet as instructed by your physician.  It will be normal to have some bloating, nausea, and abdominal discomfort intermittently. Prescriptions:  You will be provided a prescription for pain medication to take as needed.  If your pain is not severe enough to require the prescription pain medication, you may take extra strength Tylenol instead which will have less side effects.  You should also take a prescribed stool softener to avoid straining with bowel movements as the prescription pain medication may constipate you. Incisions: You may remove your dressing bandages 48 hours after surgery if not removed in the hospital.  You will either have some small staples or special tissue glue at each of the incision sites. Once the bandages are removed (if present), the incisions may stay open to air.  You may start showering (but not soaking or bathing in water) the 2nd day after surgery and the incisions simply need to be patted dry after the shower.  No additional care is needed. What to call us about: You should call the office [phone]) if you develop fever > 101 or develop persistent vomiting.   You may resume Semaglutide, aspirin, advil, aleve, vitamins, and supplements 7 days after surgery. Discuss when to resume Olmesartan with your cardiologist.

## 2023-10-20 NOTE — Progress Notes (Signed)
   10/20/23 2108  BiPAP/CPAP/SIPAP  BiPAP/CPAP/SIPAP Pt Type Adult (pt alreay on the cpap no other question prefer self placement)  BiPAP/CPAP/SIPAP Resmed  Mask Type Full face mask (home mask and tubing)  Respiratory Rate 18 breaths/min  Patient Home Equipment No  Auto Titrate Yes (18/5)  CPAP/SIPAP surface wiped down Yes

## 2023-10-20 NOTE — Progress Notes (Signed)
   10/20/23 0615  Language Assistant  Interpreter Mode In Person Face to Face  Interpreter Name Cindi Carbon  Interpreter Phone Number - If applicable [phone] (onsite)  Language & Interpreter (ONLY document if update needed)  Needs Interpreter Y  Preferred Language sgn   Patient's wife Stephen Chase needs ASL interpreter as well and Loraine Leriche can interpret for her too

## 2023-10-20 NOTE — Op Note (Signed)
Operative Note  Preoperative diagnosis:  1.  4.5 cm right renal mass  Postoperative diagnosis: 1.  4.5 cm right renal mass  Procedure(s): 1.  Robotic assisted laparoscopic right partial nephrectomy 2.  Intraoperative ultrasound of single retroperitoneal organ  Surgeon: Rhoderick Moody, MD  Assistants: 1.  Harrie Foreman, PA-C  An assistant was required for this surgical procedure.  The duties of the assistant included but were not limited to suctioning, passing suture, camera manipulation, retraction.  This procedure would not be able to be performed without an Geophysicist/field seismologist.  2.  Jerald Kief, MD PGY 4  Anesthesia:  General  Complications:  None  EBL: 200 mL  Specimens: 1.  Right renal mass  Drains/Catheters: 1.  Right lower quadrant JP drain 2.  16 French Foley catheter  Intraoperative findings:   Grossly negative margins following excision of right upper pole renal mass The renorrhaphy was hemostatic at the conclusion of the case  Indication:  Stephen Chase is a 58 y.o. male with a solid enhancing 4.5 cm right renal mass with features concerning for renal cell carcinoma.  He has been consented for the above procedures, voiced understanding and wishes to proceed.  Description of procedure:  After informed consent was signed, the patient was taken back to the operating room and properly anesthetized.  The patient was then placed in the left lateral decubitus position with all pressure points padded.  The abdomen was then prepped and draped in the usual sterile fashion.  A time-out was then performed.    An 8 mm incision was then made lateral to the right rectus muscle at the level of the right 12th rib.  A Veress needle was then used to access the abdominal cavity.  A saline drop test showed no signs of obstruction and aspiration of the Veress needle revealed no blood or sucus.  The abdominal cavity was then insufflated to 15 mmHg.  An 8 mm robotic trocar was then  atraumatically inserted into the abdominal cavity.  The robotic camera was then inserted through the port and inspection of the abdominal cavity revealed no evidence of adjacent organ or vessel injury.  We then placed three additional 8 mm robotic ports and a 12 mm assistant port in such as fashion as to triangulate the right renal hilum.  An additional 5 mm subxiphoid port was placed to act as a liver retractor.  The robot was then docked into postion.  Using a combination of blunt and cold scissors dissection, the hepatic attachments were released from the abdominal sidewall.  Using a locking grasper, the posterior aspects of the liver was reflected anteriorly.  The white line of Toldt along the ascending colon was then incised, allowing [place] to reflect the colon medially and expose the anterior surface of the right kidney.  The duodenum was then Kocherized medially, which abruptly led [place] to identification of the inferior vena cava.    Once the colon was adequately mobilized, we moved to the lower pole and identified the gonadal vein and ureter.  The gonadal vein was then left running parallel to the vena cava and the right ureter was reflected anteriorly.  Using cautious cautery, the overlying perihilar attachments were then released.  This yielded visualization of the renal hilum, which included a single right renal vein and a single right renal artery.  The perilymphatic tissue surrounding the right renal artery were carefully released so that the right renal artery was fully encircled.    We next turned our attention  to defatting the kidney.  An anterior incision along Gerota's fascia was created and the kidney was fully mobilized.   The renal mass is identified at the upper pole.  Using intraoperative ultrasound, the tumor was then carefully evaluated and demonstrated heterogenous echogenicity compared to the rest of the renal parenchyma. The resection margin was marked to allow for wide excision of the  renal mass.  The renal hilum was once again identified and a bulldog clamp was placed.    Using cold scissors, the right renal mass was then carefully excised, leaving a grossly negative margin.  Excision of the mass appeared complete with no tumor grossly remaining.  The tumor was then placed in the right lower quadrant, to be retrieved following repair of the renal defect.  A series of running 3-0 V lock sutures were then used to reapproximate the resection bed while also closing any areas that the collecting system was entered.  Tension was placed on the V-Loc sutures with hemo-lock clips.   The right renal capsule was then reapproximated using a series of interrupted 0 Vicryl sutures on a CT-1 needle in an interrupted fashion using hemo-lock clips as a buttress.  Lapra-Ty's were then placed on each of the interrupted sutures.  The bulldog was then released, which warm ischemia time totaled 19 minutes.   Once hemostasis was achieved and the renal bed was irrigated, Surgicel and Vistaseal was then placed over the renorrhaphy.  Gerota's fascia was then reapproximated with a running v-lok suture. The renal mass was retrieved and placed in an Endo Catch bag.  A right lower quadrant JP drain was placed on the anterior surface of the kidney.  The mass was extracted through the distant port, which needed to be extended approximately 2 cm on either side.  The fascia of the assistant port was then reapproximated with a 0 PDS suture.    The abdomen was desufflated with all ports removed.  The skin was then reapproximated using running Monocryl and dressed appropriately.  The JP drain was set to bulb suction.  The patient was then replaced in the supine position and was awakened from anesthesia without complications.  Plan: Monitor on the floor overnight

## 2023-10-20 NOTE — Progress Notes (Signed)
JP site noted to have some bloody drainage around the site that leaked on to bedpad. Only 30ml of JP output since admit to the unitl BP stable 102/60. Dressing reinforced, bed pad changed.

## 2023-10-20 NOTE — Transfer of Care (Signed)
Immediate Anesthesia Transfer of Care Note  Patient: Stephen Chase  Procedure(s) Performed: XI ROBOTIC ASSITED RIGHT PARTIAL NEPHRECTOMY (Right)  Patient Location: PACU  Anesthesia Type:General  Level of Consciousness: drowsy and patient cooperative  Airway & Oxygen Therapy: Patient Spontanous Breathing and Patient connected to face mask oxygen  Post-op Assessment: Report given to RN, Post -op Vital signs reviewed and stable, and Patient moving all extremities  Post vital signs: Reviewed and stable  Last Vitals:  Vitals Value Taken Time  BP 103/80 10/20/23 1131  Temp    Pulse 55 10/20/23 1137  Resp 13 10/20/23 1137  SpO2 96 % 10/20/23 1137  Vitals shown include unfiled device data.  Last Pain:  Vitals:   10/20/23 0603  TempSrc: Oral  PainSc: 2          Complications: No notable events documented.

## 2023-10-21 ENCOUNTER — Encounter (HOSPITAL_COMMUNITY): Payer: Self-pay | Admitting: Urology

## 2023-10-21 ENCOUNTER — Inpatient Hospital Stay (HOSPITAL_COMMUNITY): Payer: Medicare PPO

## 2023-10-21 LAB — BASIC METABOLIC PANEL
Anion gap: 11 (ref 5–15)
BUN: 21 mg/dL — ABNORMAL HIGH (ref 6–20)
CO2: 24 mmol/L (ref 22–32)
Calcium: 9.1 mg/dL (ref 8.9–10.3)
Chloride: 99 mmol/L (ref 98–111)
Creatinine, Ser: 1.73 mg/dL — ABNORMAL HIGH (ref 0.61–1.24)
GFR, Estimated: 46 mL/min — ABNORMAL LOW (ref >60)
Glucose, Bld: 155 mg/dL — ABNORMAL HIGH (ref 70–99)
Potassium: 3.8 mmol/L (ref 3.5–5.1)
Sodium: 134 mmol/L — ABNORMAL LOW (ref 135–145)

## 2023-10-21 LAB — HEMOGLOBIN AND HEMATOCRIT, BLOOD
HCT: 31.2 % — ABNORMAL LOW (ref 39.0–52.0)
HCT: 30.1 % — ABNORMAL LOW (ref 39.0–52.0)
Hemoglobin: 10.4 g/dL — ABNORMAL LOW (ref 13.0–17.0)
Hemoglobin: 10 g/dL — ABNORMAL LOW (ref 13.0–17.0)

## 2023-10-21 LAB — CREATININE, FLUID (PLEURAL, PERITONEAL, JP DRAINAGE): Creat, Fluid: 1.5 mg/dL

## 2023-10-21 LAB — SURGICAL PATHOLOGY

## 2023-10-21 LAB — TROPONIN I (HIGH SENSITIVITY)
Troponin I (High Sensitivity): 3 ng/L (ref <18)
Troponin I (High Sensitivity): 4 ng/L (ref <18)

## 2023-10-21 LAB — GLUCOSE, CAPILLARY
Glucose-Capillary: 167 mg/dL — ABNORMAL HIGH (ref 70–99)
Glucose-Capillary: 305 mg/dL — ABNORMAL HIGH (ref 70–99)
Glucose-Capillary: 258 mg/dL — ABNORMAL HIGH (ref 70–99)
Glucose-Capillary: 214 mg/dL — ABNORMAL HIGH (ref 70–99)

## 2023-10-21 MED ORDER — SILVER NITRATE-POT NITRATE 75-25 % EX MISC
1.0000 | Freq: Once | CUTANEOUS | Status: AC
  Administered 2023-10-21: 1 via TOPICAL
  Filled 2023-10-21: qty 1

## 2023-10-21 MED ORDER — DICLOFENAC SODIUM 1 % EX GEL
2.0000 g | Freq: Four times a day (QID) | CUTANEOUS | Status: DC
  Administered 2023-10-21 – 2023-10-22 (×2): 2 g via TOPICAL
  Filled 2023-10-21: qty 100

## 2023-10-21 MED ORDER — SODIUM CHLORIDE 0.9 % IV BOLUS
500.0000 mL | Freq: Once | INTRAVENOUS | Status: AC
Start: 2023-10-21 — End: 2023-10-21
  Administered 2023-10-21: 500 mL via INTRAVENOUS

## 2023-10-21 MED ORDER — ACETAMINOPHEN 500 MG PO TABS
500.0000 mg | ORAL_TABLET | Freq: Three times a day (TID) | ORAL | Status: DC
  Administered 2023-10-21 – 2023-10-22 (×3): 500 mg via ORAL
  Filled 2023-10-21 (×4): qty 1

## 2023-10-21 MED ORDER — CHLORHEXIDINE GLUCONATE CLOTH 2 % EX PADS: 6.0000 | MEDICATED_PAD | Freq: Every day | CUTANEOUS | Status: DC

## 2023-10-21 NOTE — Plan of Care (Signed)

## 2023-10-21 NOTE — Progress Notes (Signed)
   10/21/23 0946  TOC Brief Assessment  Patient has primary care physician Yes  Home environment has been reviewed yes  Prior level of function: Independent  Prior/Current Home Services No current home services  Social Drivers of Health Review SDOH reviewed no interventions necessary  Readmission risk has been reviewed Yes  Transition of care needs no transition of care needs at this time

## 2023-10-21 NOTE — Progress Notes (Signed)
Asked to review the ECG in this postop patient with pleuritic shoulder pain, with the computerized interpretation of "inferior infarction, age undetermined". I do not think that the sharp inferior Q waves are likely to indicate infarction, but they are new from the previous tracings and the S1 Q3 T3 pattern does raise concern for acute cor pulmonale, specifically pulmonary embolism. These ECG changes are suggestive, not specific and further workup depends on the other clinical parameters, per the primary team.

## 2023-10-21 NOTE — Anesthesia Postprocedure Evaluation (Signed)
Anesthesia Post Note  Patient: Stephen Chase  Procedure(s) Performed: XI ROBOTIC ASSITED RIGHT PARTIAL NEPHRECTOMY (Right)     Patient location during evaluation: PACU Anesthesia Type: General Level of consciousness: awake Pain management: pain level controlled Vital Signs Assessment: post-procedure vital signs reviewed and stable Respiratory status: spontaneous breathing, nonlabored ventilation and respiratory function stable Cardiovascular status: blood pressure returned to baseline and stable Postop Assessment: no apparent nausea or vomiting Anesthetic complications: no   No notable events documented.  Last Vitals:  Vitals:   10/21/23 0003 10/21/23 0415  BP: (!) 94/58 (!) 92/56  Pulse: 93 82  Resp: 18   Temp: 36.8 C 36.6 C  SpO2: 98% 98%    Last Pain:  Vitals:   10/21/23 0415  TempSrc: Oral  PainSc:                  Jazaria Jarecki P Bentleigh Waren

## 2023-10-21 NOTE — Inpatient Diabetes Management (Signed)
Inpatient Diabetes Program Recommendations  AACE/ADA: New Consensus Statement on Inpatient Glycemic Control (2015)  Target Ranges:  Prepandial:   less than 140 mg/dL      Peak postprandial:   less than 180 mg/dL (1-2 hours)      Critically ill patients:  140 - 180 mg/dL   Lab Results  Component Value Date   GLUCAP 305 (H) 10/21/2023   HGBA1C 7.1 (H) 10/14/2023    Review of Glycemic Control  Latest Reference Range & Units 10/20/23 08:10 10/20/23 10:15 10/20/23 12:17 10/20/23 14:12 10/20/23 17:47 10/20/23 20:30 10/21/23 07:48 10/21/23 11:08  Glucose-Capillary 70 - 99 mg/dL 161 (H) 096 (H) 045 (H) 210 (H) 181 (H) 190 (H) 167 (H) 305 (H)  (H): Data is abnormally high  Diabetes history: DM2 Outpatient Diabetes medications: Ozempic 2 mg weekly, Farxiga 10 mg every day  Current orders for Inpatient glycemic control: Novolog 0-15 units TID  Inpatient Diabetes Program Recommendations:    Novolog 3 units TID with meals Carbohydrate modified diet  Will continue to follow while inpatient.  Thank you, Dulce Sellar, MSN, CDCES Diabetes Coordinator Inpatient Diabetes Program [phone] (team pager from 8a-5p)

## 2023-10-21 NOTE — Progress Notes (Signed)
   10/21/23 2317  BiPAP/CPAP/SIPAP  BiPAP/CPAP/SIPAP Pt Type Adult  BiPAP/CPAP/SIPAP Resmed  Mask Type Full face mask  Mask Size  (home mask)  Respiratory Rate 18 breaths/min  Patient Home Equipment Yes (pt home mask and tubing)  Auto Titrate  (5-18)

## 2023-10-21 NOTE — Progress Notes (Signed)
Contacted provider regarding pt's BP and leaking around J.P. site. Ordered 500 cc bolus with a rate of 100 ml/hr, and to keep J.P. drain clean, dry and intact. Requested silver nitrate at bedside. Verbally read back orders to confirm. Orders confirmed by provider in place.

## 2023-10-21 NOTE — Progress Notes (Addendum)
1 Day Post-Op Subjective: The patient is doing well.  No nausea or vomiting. Pain is adequately controlled.  Endorses shoulder pain and pain with deep breathing.   Tolerating clear liquid diet.   Cr elevated to 1.7 from 1.5 post op. Hg 10 from 12 post op.   ASL interpreter used for interview. Discussed post instructions. Interview for 1 hour.   Objective: Vital signs in last 24 hours: Temp:  [97.9 F (36.6 C)-98.6 F (37 C)] 97.9 F (36.6 C) (01/30 0415) Pulse Rate:  [49-97] 82 (01/30 0415) Resp:  [10-19] 18 (01/30 0003) BP: (92-131)/(56-95) 92/56 (01/30 0415) SpO2:  [92 %-100 %] 98 % (01/30 0415)  Intake/Output from previous day: 01/29 0701 - 01/30 0700 In: 3556.5 [P.O.:357; I.V.:2685.5; IV Piggyback:514.1] Out: 1940 [Urine:1500; Drains:240; Blood:200] Intake/Output this shift: No intake/output data recorded.  Physical Exam:  General: Alert and oriented. CV: RRR Lungs: Clear bilaterally. GI: Soft, Nondistended. Incisions: Clean, dry, and intact Urine: Clear Extremities: Nontender, no erythema, no edema.  Lab Results: Recent Labs    10/20/23 1335 10/21/23 0512  HGB 12.3* 10.4*  HCT 36.4* 31.2*      Assessment/Plan: POD# 1 s/p robotic right partial Nx  1) SL IVF 2) Ambulate, Incentive spirometry 3) Transition to oral pain medication 4) TOV 5) Repeat Hg at 11 am 6) Plan for likely discharge later today   LOS: 1 day   Dezaray Shibuya Matthew-Onabanjo 10/21/2023, 8:39 AM

## 2023-10-22 ENCOUNTER — Inpatient Hospital Stay (HOSPITAL_COMMUNITY): Payer: Medicare PPO

## 2023-10-22 LAB — GLUCOSE, CAPILLARY
Glucose-Capillary: 191 mg/dL — ABNORMAL HIGH (ref 70–99)
Glucose-Capillary: 270 mg/dL — ABNORMAL HIGH (ref 70–99)

## 2023-10-22 MED ORDER — TECHNETIUM TO 99M ALBUMIN AGGREGATED
4.4000 | Freq: Once | INTRAVENOUS | Status: AC
  Administered 2023-10-22: 4.4 via INTRAVENOUS

## 2023-10-22 NOTE — Progress Notes (Signed)
2 Days Post-Op Subjective: Patient reports feeling well.  Right shoulder pain has improved esp with voltaren gel.  No inspiratory pain.  Ambulating without difficulty.  Passing flatus and tolerating a regular diet.  Voiding.   Interpreter use.  Objective: Vital signs in last 24 hours: Temp:  [98.4 F (36.9 C)] 98.4 F (36.9 C) (01/31 0602) Pulse Rate:  [88-128] 98 (01/31 0602) Resp:  [16-18] 18 (01/31 0602) BP: (100-116)/(53-68) 100/62 (01/31 0602) SpO2:  [93 %-97 %] 95 % (01/31 0602)  Intake/Output from previous day: 01/30 0701 - 01/31 0700 In: -  Out: 600 [Urine:600] Intake/Output this shift: No intake/output data recorded.  Physical Exam:  General:alert, cooperative, and no distress Cardiovascular: RRR Lungs: CTA GI: soft ND +BS appropriately tender Incisions: C/D/I  Lab Results: Recent Labs    10/20/23 1335 10/21/23 0512 10/21/23 1115  HGB 12.3* 10.4* 10.0*  HCT 36.4* 31.2* 30.1*   BMET Recent Labs    10/21/23 0512  NA 134*  K 3.8  CL 99  CO2 24  GLUCOSE 155*  BUN 21*  CREATININE 1.73*  CALCIUM 9.1   No results for input(s): "LABPT", "INR" in the last 72 hours. No results for input(s): "LABURIN" in the last 72 hours. Results for orders placed or performed during the hospital encounter of 08/23/22  Resp Panel by RT-PCR (Flu A&B, Covid) Anterior Nasal Swab     Status: Abnormal   Collection Time: 08/23/22 11:24 AM   Specimen: Anterior Nasal Swab  Result Value Ref Range Status   SARS Coronavirus 2 by RT PCR POSITIVE (A) NEGATIVE Final    Comment: (NOTE) SARS-CoV-2 target nucleic acids are DETECTED.  The SARS-CoV-2 RNA is generally detectable in upper respiratory specimens during the acute phase of infection. Positive results are indicative of the presence of the identified virus, but do not rule out bacterial infection or co-infection with other pathogens not detected by the test. Clinical correlation with patient history and other diagnostic  information is necessary to determine patient infection status. The expected result is Negative.  Fact Sheet for Patients: BloggerCourse.com  Fact Sheet for Healthcare Providers: SeriousBroker.it  This test is not yet approved or cleared by the [place] FDA and  has been authorized for detection and/or diagnosis of SARS-CoV-2 by FDA under an Emergency Use Authorization (EUA).  This EUA will remain in effect (meaning this test can be used) for the duration of  the COVID-19 declaration under Section 564(b)(1) of the A ct, 21 U.S.C. section 360bbb-3(b)(1), unless the authorization is terminated or revoked sooner.     Influenza A by PCR NEGATIVE NEGATIVE Final   Influenza B by PCR NEGATIVE NEGATIVE Final    Comment: (NOTE) The Xpert Xpress SARS-CoV-2/FLU/RSV plus assay is intended as an aid in the diagnosis of influenza from Nasopharyngeal swab specimens and should not be used as a sole basis for treatment. Nasal washings and aspirates are unacceptable for Xpert Xpress SARS-CoV-2/FLU/RSV testing.  Fact Sheet for Patients: BloggerCourse.com  Fact Sheet for Healthcare Providers: SeriousBroker.it  This test is not yet approved or cleared by the [place] FDA and has been authorized for detection and/or diagnosis of SARS-CoV-2 by FDA under an Emergency Use Authorization (EUA). This EUA will remain in effect (meaning this test can be used) for the duration of the COVID-19 declaration under Section 564(b)(1) of the Act, 21 U.S.C. section 360bbb-3(b)(1), unless the authorization is terminated or revoked.  Performed at [facility], [address], [place], [place] [address]  Studies/Results: DG CHEST PORT 1 VIEW Result Date: 10/21/2023 CLINICAL DATA:  Chest pain. EXAM: PORTABLE CHEST 1 VIEW COMPARISON:  CT 06/30/2023.  Radiographs 12/05/2019 and 04/08/2011.  FINDINGS: 1406 hours. Suboptimal inspiration with resulting mild vascular crowding in both lung bases. There is no edema, confluent airspace disease, pleural effusion or pneumothorax. The heart size and mediastinal contours are stable. No osseous abnormalities are identified. IMPRESSION: No evidence of acute cardiopulmonary process. Suboptimal inspiration. Electronically Signed   By: Carey Bullocks M.D.   On: 10/21/2023 15:00    Assessment/Plan: 2 Days Post-Op, Procedure(s) (LRB): XI ROBOTIC ASSITED RIGHT PARTIAL NEPHRECTOMY (Right)  Doing well Awaiting VQ scan. Should be done this morning Ambulate, IS If scan clear pt can d/c home today   LOS: 2 days   Harrie Foreman 10/22/2023, 7:27 AM

## 2023-10-22 NOTE — Inpatient Diabetes Management (Signed)
Inpatient Diabetes Program Recommendations  AACE/ADA: New Consensus Statement on Inpatient Glycemic Control (2015)  Target Ranges:  Prepandial:   less than 140 mg/dL      Peak postprandial:   less than 180 mg/dL (1-2 hours)      Critically ill patients:  140 - 180 mg/dL   Lab Results  Component Value Date   GLUCAP 270 (H) 10/22/2023   HGBA1C 7.1 (H) 10/14/2023    Review of Glycemic Control  Diabetes history: DM2 Outpatient Diabetes medications: Ozempic 2 mg weekly, Farxiga 10 mg every day  Current orders for Inpatient glycemic control: Novolog 0-15 units TID  Inpatient Diabetes Program Recommendations:    Consider adding Novolog 3 units TID with meals if eating > 50%  Continue to follow.  Thank you. Ailene Ards, RD, LDN, CDCES Inpatient Diabetes Coordinator [phone]

## 2023-10-22 NOTE — Discharge Summary (Signed)
Date of admission: 10/20/2023  Date of discharge: 10/22/2023  Admission diagnosis: Right renal mass  Discharge diagnosis: Clear cell renal cell carcinoma pT1b  Secondary diagnoses: HTN, CKD 3, esophageal varices, CAD, OSA, DM II, deafness  History and Physical: For full details, please see admission history and physical. Briefly, Stephen Chase is a 58 y.o. year old patient witha 3.8 x 4.5 cm solid and enhancing right renal mass with features concerning for renal cell carcinoma. The mass was initially discovered on CT L-spine from 03/15/2023 due to ongoing lumbar radiculopathy and further characterized via CT abdomen with and without contrast on 06/04/2023.   Hospital Course: Pt was admitted and taken to the OR on 10/20/23 for a robotic assisted lap right partial nephrectomy.  Pt tolerated the procedure well and was hemodynamically stable throughout.  He was extubated without complication and woke up from anesthesia neurologically intact.  He was transferred from the OR to PACU and then to the floor without issue.  On POD 1 he complained of right shoulder pain and pain with deep inspiration while walking. This significantly improved with rest.  Out of an abundance of caution and ECG was obtained that revealed "inferior infarction, age undetermined".  Chest Xray and troponins were negative. A cardiology consult was obtained and Dr. Royann Shivers did not think that the ECG represented an infarct but raised concern for acute cor pulmonale specifically PE.  Due to the pt's decreased renal function a VQ scan was obtained to r/o PE.  This scan was performed on POD 2 and was negative.    The remainder of the pt's post op course progressed as expected.  Jp and foley were removed on POD 1.  The pt has been able to void, ambulate, and tolerate a regular diet with good pain control except as previously noted.  On POD 2 his shoulder pain and pain with inspiration were resolved.  These were likely both due to CO2 insufflation  and liver retraction during surgery. He has passed flatus but has not had a BM.  His vitals remained stable post op, however, his BP has been a little soft, therefore, his Olmesartan has been held and he has been instructed to continue to hold it until re-eval by his cardiologist.  Carvedilol has been continued. On the afternoon of POD 2 he had met all d/c criteria and was felt stable for d/c home.   Laboratory values:  Recent Labs    10/20/23 1335 10/21/23 0512 10/21/23 1115  HGB 12.3* 10.4* 10.0*  HCT 36.4* 31.2* 30.1*   Recent Labs    10/21/23 0512  CREATININE 1.73*    Disposition: Home  Discharge instruction: The patient was instructed to be ambulatory but told to refrain from heavy lifting, strenuous activity, or driving.   Contact cardiologist for f/u regarding inpatient findings and BP control.  Discharge medications:  Allergies as of 10/22/2023       Reactions   Other    Blood Refusal         Medication List     STOP taking these medications    aspirin EC 81 MG tablet   BC FAST PAIN RELIEF ARTHRITIS PO   BC FAST PAIN RELIEF PO   cholecalciferol 25 MCG (1000 UNIT) tablet Commonly known as: VITAMIN D3   Magnesium 250 MG Tabs   multivitamin with minerals tablet   olmesartan-hydrochlorothiazide 40-25 MG tablet Commonly known as: BENICAR HCT   Ozempic (2 MG/DOSE) 8 MG/3ML Sopn Generic drug: Semaglutide (2 MG/DOSE)  TAKE these medications    Accu-Chek Guide test strip Generic drug: glucose blood CHECK BLOOD SUGAR 3 TIMES A DAY BEFORE BREAKFAST, LUNCH, AND DINNER   glucose blood test strip Use as instructed to test blood sugars 3 times a day dx code e11.65   Accu-Chek Guide w/Device Kit Use to check blood sugars three times daily E11.9   Accu-Chek Softclix Lancets lancets Use as instructed to test blood sugars 3 times a day dx code e11.65   atorvastatin 40 MG tablet Commonly known as: LIPITOR TAKE 1 TABLET(40 MG) BY MOUTH AT BEDTIME    carvedilol 3.125 MG tablet Commonly known as: COREG Take 1 tablet (3.125 mg total) by mouth 2 (two) times daily.   docusate sodium 100 MG capsule Commonly known as: COLACE Take 1 capsule (100 mg total) by mouth 2 (two) times daily.   ezetimibe 10 MG tablet Commonly known as: ZETIA Take 1 tablet (10 mg total) by mouth daily.   FARXIGA PO Take 10 mg by mouth daily.   fenofibrate 145 MG tablet Commonly known as: TRICOR TAKE 1 TABLET(145 MG) BY MOUTH DAILY   HYDROcodone-acetaminophen 5-325 MG tablet Commonly known as: Norco Take 1-2 tablets by mouth every 6 (six) hours as needed for moderate pain (pain score 4-6) or severe pain (pain score 7-10).   icosapent Ethyl 1 g capsule Commonly known as: Vascepa One bid for elevated triglycerides   modafinil 200 MG tablet Commonly known as: PROVIGIL Take 1 tablet (200 mg total) by mouth daily.   omeprazole 10 MG capsule Commonly known as: PRILOSEC TAKE 1 CAPSULE(10 MG) BY MOUTH DAILY   tamsulosin 0.4 MG Caps capsule Commonly known as: FLOMAX TAKE 1 CAPSULE BY MOUTH EVERY DAY 30 MINUTES AFTER THE SAME MEAL   UNABLE TO FIND CPAP for OSA (6cm-19cm)        Followup:   Follow-up Information     [name], MD Follow up on 11/04/2023.   Specialty: Urology Why: at 10:00 Contact information: [address] [place] [address] [place] [address] [phone]

## 2023-10-22 NOTE — Plan of Care (Signed)

## 2023-11-01 ENCOUNTER — Inpatient Hospital Stay (HOSPITAL_COMMUNITY)
Admission: EM | Admit: 2023-11-01 | Discharge: 2023-11-04 | DRG: 920 | Disposition: A | Discharge status: Home / Self Care | Payer: Medicare PPO | Attending: Internal Medicine | Admitting: Internal Medicine

## 2023-11-01 ENCOUNTER — Other Ambulatory Visit: Payer: Self-pay

## 2023-11-01 ENCOUNTER — Encounter (HOSPITAL_COMMUNITY): Payer: Self-pay

## 2023-11-01 DIAGNOSIS — Z603 Acculturation difficulty: Secondary | ICD-10-CM | POA: Diagnosis present

## 2023-11-01 DIAGNOSIS — Y838 Other surgical procedures as the cause of abnormal reaction of the patient, or of later complication, without mention of misadventure at the time of the procedure: Secondary | ICD-10-CM | POA: Diagnosis present

## 2023-11-01 DIAGNOSIS — N99840 Postprocedural hematoma of a genitourinary system organ or structure following a genitourinary system procedure: Secondary | ICD-10-CM | POA: Diagnosis present

## 2023-11-01 DIAGNOSIS — Z85528 Personal history of other malignant neoplasm of kidney: Secondary | ICD-10-CM | POA: Diagnosis not present

## 2023-11-01 DIAGNOSIS — M4726 Other spondylosis with radiculopathy, lumbar region: Secondary | ICD-10-CM | POA: Diagnosis present

## 2023-11-01 DIAGNOSIS — G4733 Obstructive sleep apnea (adult) (pediatric): Secondary | ICD-10-CM | POA: Diagnosis present

## 2023-11-01 DIAGNOSIS — T148XXA Other injury of unspecified body region, initial encounter: Secondary | ICD-10-CM | POA: Diagnosis present

## 2023-11-01 DIAGNOSIS — Z6831 Body mass index (BMI) 31.0-31.9, adult: Secondary | ICD-10-CM | POA: Diagnosis not present

## 2023-11-01 DIAGNOSIS — E6609 Other obesity due to excess calories: Secondary | ICD-10-CM | POA: Diagnosis present

## 2023-11-01 DIAGNOSIS — E1142 Type 2 diabetes mellitus with diabetic polyneuropathy: Secondary | ICD-10-CM | POA: Diagnosis present

## 2023-11-01 DIAGNOSIS — F17210 Nicotine dependence, cigarettes, uncomplicated: Secondary | ICD-10-CM | POA: Diagnosis present

## 2023-11-01 DIAGNOSIS — D62 Acute posthemorrhagic anemia: Secondary | ICD-10-CM | POA: Diagnosis present

## 2023-11-01 DIAGNOSIS — E781 Pure hyperglyceridemia: Secondary | ICD-10-CM | POA: Diagnosis present

## 2023-11-01 DIAGNOSIS — E1122 Type 2 diabetes mellitus with diabetic chronic kidney disease: Secondary | ICD-10-CM | POA: Diagnosis present

## 2023-11-01 DIAGNOSIS — H9193 Unspecified hearing loss, bilateral: Secondary | ICD-10-CM | POA: Diagnosis present

## 2023-11-01 DIAGNOSIS — R319 Hematuria, unspecified: Secondary | ICD-10-CM | POA: Diagnosis present

## 2023-11-01 DIAGNOSIS — I10 Essential (primary) hypertension: Secondary | ICD-10-CM | POA: Diagnosis present

## 2023-11-01 DIAGNOSIS — K219 Gastro-esophageal reflux disease without esophagitis: Secondary | ICD-10-CM | POA: Diagnosis present

## 2023-11-01 DIAGNOSIS — D649 Anemia, unspecified: Principal | ICD-10-CM

## 2023-11-01 DIAGNOSIS — K7469 Other cirrhosis of liver: Secondary | ICD-10-CM | POA: Diagnosis present

## 2023-11-01 DIAGNOSIS — Z905 Acquired absence of kidney: Secondary | ICD-10-CM | POA: Diagnosis not present

## 2023-11-01 DIAGNOSIS — Z531 Procedure and treatment not carried out because of patient's decision for reasons of belief and group pressure: Secondary | ICD-10-CM | POA: Diagnosis present

## 2023-11-01 DIAGNOSIS — Z79899 Other long term (current) drug therapy: Secondary | ICD-10-CM | POA: Diagnosis not present

## 2023-11-01 DIAGNOSIS — R188 Other ascites: Secondary | ICD-10-CM

## 2023-11-01 DIAGNOSIS — R300 Dysuria: Secondary | ICD-10-CM | POA: Diagnosis present

## 2023-11-01 DIAGNOSIS — N182 Chronic kidney disease, stage 2 (mild): Secondary | ICD-10-CM | POA: Diagnosis present

## 2023-11-01 DIAGNOSIS — G8918 Other acute postprocedural pain: Secondary | ICD-10-CM | POA: Diagnosis not present

## 2023-11-01 DIAGNOSIS — M5416 Radiculopathy, lumbar region: Secondary | ICD-10-CM | POA: Diagnosis present

## 2023-11-01 DIAGNOSIS — H903 Sensorineural hearing loss, bilateral: Secondary | ICD-10-CM | POA: Diagnosis present

## 2023-11-01 DIAGNOSIS — S37011A Minor contusion of right kidney, initial encounter: Secondary | ICD-10-CM

## 2023-11-01 DIAGNOSIS — E119 Type 2 diabetes mellitus without complications: Secondary | ICD-10-CM

## 2023-11-01 DIAGNOSIS — I129 Hypertensive chronic kidney disease with stage 1 through stage 4 chronic kidney disease, or unspecified chronic kidney disease: Secondary | ICD-10-CM | POA: Diagnosis present

## 2023-11-01 DIAGNOSIS — E66811 Obesity, class 1: Secondary | ICD-10-CM

## 2023-11-01 DIAGNOSIS — J302 Other seasonal allergic rhinitis: Secondary | ICD-10-CM | POA: Diagnosis present

## 2023-11-01 DIAGNOSIS — K5730 Diverticulosis of large intestine without perforation or abscess without bleeding: Secondary | ICD-10-CM | POA: Diagnosis not present

## 2023-11-01 DIAGNOSIS — M47816 Spondylosis without myelopathy or radiculopathy, lumbar region: Secondary | ICD-10-CM | POA: Diagnosis present

## 2023-11-01 DIAGNOSIS — R1031 Right lower quadrant pain: Secondary | ICD-10-CM | POA: Diagnosis not present

## 2023-11-01 DIAGNOSIS — R1030 Lower abdominal pain, unspecified: Secondary | ICD-10-CM | POA: Diagnosis not present

## 2023-11-01 LAB — COMPREHENSIVE METABOLIC PANEL
ALT: 28 U/L (ref 0–44)
AST: 27 U/L (ref 15–41)
Albumin: 3.9 g/dL (ref 3.5–5.0)
Alkaline Phosphatase: 68 U/L (ref 38–126)
Anion gap: 12 (ref 5–15)
BUN: 20 mg/dL (ref 6–20)
CO2: 23 mmol/L (ref 22–32)
Calcium: 8.9 mg/dL (ref 8.9–10.3)
Chloride: 98 mmol/L (ref 98–111)
Creatinine, Ser: 1.36 mg/dL — ABNORMAL HIGH (ref 0.61–1.24)
GFR, Estimated: >60 mL/min (ref >60)
Glucose, Bld: 167 mg/dL — ABNORMAL HIGH (ref 70–99)
Potassium: 3.8 mmol/L (ref 3.5–5.1)
Sodium: 133 mmol/L — ABNORMAL LOW (ref 135–145)
Total Bilirubin: 1.2 mg/dL (ref 0.0–1.2)
Total Protein: 7.6 g/dL (ref 6.5–8.1)

## 2023-11-01 LAB — URINALYSIS, ROUTINE W REFLEX MICROSCOPIC
Bacteria, UA: NONE SEEN
Bilirubin Urine: NEGATIVE
Glucose, UA: >=500 mg/dL — AB
Ketones, ur: NEGATIVE mg/dL
Leukocytes,Ua: NEGATIVE
Nitrite: NEGATIVE
Protein, ur: NEGATIVE mg/dL
Specific Gravity, Urine: 1.028 (ref 1.005–1.030)
pH: 5 (ref 5.0–8.0)

## 2023-11-01 LAB — CBC WITH DIFFERENTIAL/PLATELET
Abs Immature Granulocytes: 0.21 10*3/uL — ABNORMAL HIGH (ref 0.00–0.07)
Basophils Absolute: 0.1 10*3/uL (ref 0.0–0.1)
Basophils Relative: 1 %
Eosinophils Absolute: 0.6 10*3/uL — ABNORMAL HIGH (ref 0.0–0.5)
Eosinophils Relative: 7 %
HCT: 25.1 % — ABNORMAL LOW (ref 39.0–52.0)
Hemoglobin: 8 g/dL — ABNORMAL LOW (ref 13.0–17.0)
Immature Granulocytes: 2 %
Lymphocytes Relative: 18 %
Lymphs Abs: 1.6 10*3/uL (ref 0.7–4.0)
MCH: 29.4 pg (ref 26.0–34.0)
MCHC: 31.9 g/dL (ref 30.0–36.0)
MCV: 92.3 fL (ref 80.0–100.0)
Monocytes Absolute: 0.5 10*3/uL (ref 0.1–1.0)
Monocytes Relative: 6 %
Neutro Abs: 6.1 10*3/uL (ref 1.7–7.7)
Neutrophils Relative %: 66 %
Platelets: 383 10*3/uL (ref 150–400)
RBC: 2.72 MIL/uL — ABNORMAL LOW (ref 4.22–5.81)
RDW: 13.4 % (ref 11.5–15.5)
WBC: 9.1 10*3/uL (ref 4.0–10.5)
nRBC: 0.3 % — ABNORMAL HIGH (ref 0.0–0.2)

## 2023-11-01 LAB — AMMONIA: Ammonia: 40 umol/L — ABNORMAL HIGH (ref 9–35)

## 2023-11-01 LAB — LIPASE, BLOOD: Lipase: 44 U/L (ref 11–51)

## 2023-11-01 MED ORDER — FENTANYL CITRATE PF 50 MCG/ML IJ SOSY
50.0000 ug | PREFILLED_SYRINGE | Freq: Once | INTRAMUSCULAR | Status: AC
  Administered 2023-11-02: 50 ug via INTRAVENOUS
  Filled 2023-11-01: qty 1

## 2023-11-01 MED ORDER — SODIUM CHLORIDE 0.9 % IV BOLUS
1000.0000 mL | Freq: Once | INTRAVENOUS | Status: AC
  Administered 2023-11-02: 1000 mL via INTRAVENOUS

## 2023-11-01 NOTE — ED Provider Notes (Signed)
[place] EMERGENCY DEPARTMENT AT [facility] Provider Note   CSN: [id number] Arrival date & time: 11/01/23  1544     History  Chief Complaint  Patient presents with   Post-op Problem   Jaundice    Stephen Chase is a 58 y.o. male, hx of right renal mass, who presents to the ED 2/2 to abdominal pain, since having his partial right nephrectomy on 1/30.  Wife at bedside states that patient has had persistent abdominal pain, flank pain, since the surgery.  The initially thought it was just postop pain, but the pain persisted, waxes and wanes in nature, and is painful to urinate.  He denies any kind of burning with urination, increased frequency or urgency.  States his urine has been in normal color.  Has had 2 episodes of vomiting, and feels chills, but mainly came in because wife thinks that he looks a little bit yellow or pale.  He has been looking pale for the last couple days.  Has not had any bloody stools, or black stools.  Denies vomiting any blood.  Is not currently on any blood thinners.  Called his surgeon and was told to come to the ER.  Dr. Liliane Shi did his surgery    Home Medications Prior to Admission medications   Medication Sig Start Date End Date Taking? Authorizing Provider  Accu-Chek Softclix Lancets lancets Use as instructed to test blood sugars 3 times a day dx code e11.65 09/01/21   [name], FNP  atorvastatin (LIPITOR) 40 MG tablet TAKE 1 TABLET(40 MG) BY MOUTH AT BEDTIME 05/06/23   [name], FNP  Blood Glucose Monitoring Suppl (ACCU-CHEK GUIDE) w/Device KIT Use to check blood sugars three times daily E11.9 02/06/20   [name], FNP  carvedilol (COREG) 3.125 MG tablet Take 1 tablet (3.125 mg total) by mouth 2 (two) times daily. 09/29/23 12/28/23  Chrystie Nose, MD  Dapagliflozin Propanediol (FARXIGA PO) Take 10 mg by mouth daily.    [provider]  docusate sodium (COLACE) 100 MG capsule Take 1 capsule (100 mg total) by mouth 2 (two) times  daily. 10/20/23   Harrie Foreman, PA-C  ezetimibe (ZETIA) 10 MG tablet Take 1 tablet (10 mg total) by mouth daily. 09/29/23 12/28/23  Chrystie Nose, MD  fenofibrate (TRICOR) 145 MG tablet TAKE 1 TABLET(145 MG) BY MOUTH DAILY 04/08/23   [name], FNP  glucose blood (ACCU-CHEK GUIDE) test strip CHECK BLOOD SUGAR 3 TIMES A DAY BEFORE BREAKFAST, LUNCH, AND DINNER 10/11/20   [name], FNP  glucose blood test strip Use as instructed to test blood sugars 3 times a day dx code e11.65 09/30/23   [name], FNP  HYDROcodone-acetaminophen (NORCO) 5-325 MG tablet Take 1-2 tablets by mouth every 6 (six) hours as needed for moderate pain (pain score 4-6) or severe pain (pain score 7-10). 10/20/23   Harrie Foreman, PA-C  icosapent Ethyl (VASCEPA) 1 g capsule One bid for elevated triglycerides 05/18/23   Hilty, Lisette Abu, MD  modafinil (PROVIGIL) 200 MG tablet Take 1 tablet (200 mg total) by mouth daily. 05/21/23   Butch Penny, NP  omeprazole (PRILOSEC) 10 MG capsule TAKE 1 CAPSULE(10 MG) BY MOUTH DAILY 04/12/23   [name], FNP  tamsulosin (FLOMAX) 0.4 MG CAPS capsule TAKE 1 CAPSULE BY MOUTH EVERY DAY 30 MINUTES AFTER THE SAME MEAL 06/08/23   [name], FNP  UNABLE TO FIND CPAP for OSA (6cm-19cm)    [provider]      Allergies  Other    Review of Systems   Review of Systems  Gastrointestinal:  Positive for abdominal pain and vomiting. Negative for diarrhea and nausea.    Physical Exam Updated Vital Signs BP 117/66 (BP Location: Left Arm)   Pulse 83   Temp 98.2 F (36.8 C) (Oral)   Resp 17   Ht 5\' 9"  (1.753 m)   Wt 95.7 kg   SpO2 100%   BMI 31.16 kg/m  Physical Exam Vitals and nursing note reviewed.  Constitutional:      General: He is not in acute distress.    Appearance: He is well-developed.     Comments: Well-appearing  HENT:     Head: Normocephalic and atraumatic.  Eyes:     Conjunctiva/sclera: Conjunctivae normal.  Cardiovascular:     Rate and Rhythm:  Normal rate and regular rhythm.     Heart sounds: No murmur heard. Pulmonary:     Effort: Pulmonary effort is normal. No respiratory distress.     Breath sounds: Normal breath sounds.  Abdominal:     Palpations: Abdomen is soft.     Tenderness: There is abdominal tenderness in the right upper quadrant and right lower quadrant. There is right CVA tenderness.     Comments: Ecchymosis to right flank, surgical incisions nonerythematous, but very tender.  Musculoskeletal:        General: No swelling.     Cervical back: Neck supple.  Skin:    General: Skin is warm and dry.     Capillary Refill: Capillary refill takes less than 2 seconds.  Neurological:     Mental Status: He is alert.  Psychiatric:        Mood and Affect: Mood normal.     ED Results / Procedures / Treatments   Labs (all labs ordered are listed, but only abnormal results are displayed) Labs Reviewed  CBC WITH DIFFERENTIAL/PLATELET - Abnormal; Notable for the following components:      Result Value   RBC 2.72 (*)    Hemoglobin 8.0 (*)    HCT 25.1 (*)    nRBC 0.3 (*)    Eosinophils Absolute 0.6 (*)    Abs Immature Granulocytes 0.21 (*)    All other components within normal limits  COMPREHENSIVE METABOLIC PANEL - Abnormal; Notable for the following components:   Sodium 133 (*)    Glucose, Bld 167 (*)    Creatinine, Ser 1.36 (*)    All other components within normal limits  URINALYSIS, ROUTINE W REFLEX MICROSCOPIC - Abnormal; Notable for the following components:   Glucose, UA >=500 (*)    Hgb urine dipstick MODERATE (*)    All other components within normal limits  AMMONIA - Abnormal; Notable for the following components:   Ammonia 40 (*)    All other components within normal limits  CULTURE, BLOOD (ROUTINE X 2)  CULTURE, BLOOD (ROUTINE X 2)  LIPASE, BLOOD  PROTIME-INR  TYPE AND SCREEN    EKG None  Radiology CT ABDOMEN PELVIS W CONTRAST Result Date: 11/02/2023 CLINICAL DATA:  Partial right kidney  removal on October 20, 2023, presenting with painful urination and lower abdominal pain. EXAM: CT ABDOMEN AND PELVIS WITH CONTRAST TECHNIQUE: Multidetector CT imaging of the abdomen and pelvis was performed using the standard protocol following bolus administration of intravenous contrast. RADIATION DOSE REDUCTION: This exam was performed according to the departmental dose-optimization program which includes automated exposure control, adjustment of the mA and/or kV according to patient size and/or use of iterative reconstruction  technique. CONTRAST:  OMNIPAQUE IOHEXOL 300 MG/ML  SOLN COMPARISON:  June 04, 2023 FINDINGS: Lower chest: No acute abnormality. Hepatobiliary: No focal liver abnormality is seen. No gallstones, gallbladder wall thickening, or biliary dilatation. Pancreas: Unremarkable. No pancreatic ductal dilatation or surrounding inflammatory changes. Spleen: Normal in size without focal abnormality. Adrenals/Urinary Tract: Adrenal glands are unremarkable. The left kidney is normal, without obstructing renal calculi, focal lesion, or hydronephrosis. Evidence of the patient's recent partial right nephrectomy is seen with a 5.1 cm x 4.3 cm x 6.4 cm area of heterogeneous low attenuation (approximately 55.21 Hounsfield units) noted along the posterior and lateral aspects of the mid and upper right kidney. This area contains numerous Jazzelle Zhang foci of air and connects with a mild-to-moderate amount of perinephric fluid seen extending from the mid right kidney to the lower pole of the right kidney (approximately 44.40 Hounsfield units). A mild amount of posterior paranephric fluid is also present (approximately 60.07 Hounsfield units). Moderate severity, predominantly anterior urinary bladder wall thickening is seen. Stomach/Bowel: Stomach is within normal limits. Appendix appears normal. No evidence of bowel wall thickening, distention, or inflammatory changes. Numerous noninflamed diverticula are seen  throughout the ascending colon Vascular/Lymphatic: Aortic atherosclerosis. No enlarged abdominal or pelvic lymph nodes. Reproductive: The prostate gland is mildly enlarged. Other: A mild amount of para muscular air is seen within the subcutaneous fat of the anterior abdominal wall on the right. A mild to moderate amount of perihepatic (approximately 71.25 Hounsfield units) and perisplenic fluid (approximately 51.95 Hounsfield units) is seen. A moderate amount of pelvic free fluid is also noted (approximately 37.52 Hounsfield units). Musculoskeletal: Multilevel degenerative changes are seen throughout the lumbar spine. IMPRESSION: 1. Evidence of the patient's recent partial right nephrectomy with a 5.1 cm x 4.3 cm x 6.4 cm postoperative hematoma seen along the posterior and lateral aspects of the mid and upper right kidney. While there is no definite evidence to suggest active bleeding, and associated developing abscess cannot be excluded. 2. Moderate severity, predominantly anterior urinary bladder wall thickening which may represent sequelae associated with cystitis. Correlation with urinalysis is recommended. 3. Mild to moderate amount of perihepatic and perisplenic fluid, as well as a moderate amount of pelvic free fluid. A hemorrhagic component cannot be excluded. 4. Colonic diverticulosis. 5. Aortic atherosclerosis. Aortic Atherosclerosis (ICD10-I70.0). Electronically Signed   By: Aram Candela M.D.   On: 11/02/2023 01:33    Procedures Procedures    Medications Ordered in ED Medications  cefTRIAXone (ROCEPHIN) 1 g in sodium chloride 0.9 % 100 mL IVPB (has no administration in time range)  metroNIDAZOLE (FLAGYL) IVPB 500 mg (has no administration in time range)  fentaNYL (SUBLIMAZE) injection 50 mcg (50 mcg Intravenous Given 11/02/23 0000)  sodium chloride 0.9 % bolus 1,000 mL (1,000 mLs Intravenous New Bag/Given 11/02/23 0011)  iohexol (OMNIPAQUE) 300 MG/ML solution 100 mL (100 mLs Intravenous  Contrast Given 11/02/23 0041)    ED Course/ Medical Decision Making/ A&P                                 Medical Decision Making Patient is a 58 year old male, here for right flank pain, has been progressively worsening, since he had his surgery done by Dr. Sande Brothers.  He has ecchymosis of his right flank, surgical incisions, are clean, and nonerythematous.  We will obtain a CT abdomen pelvis for further evaluation, as well as obtain blood work.  He is saying he  is having chills as well.  Amount and/or Complexity of Data Reviewed Labs: ordered.    Details: Hemoglobin dropped from 10-8.0, in the span of 2 weeks, blood in urine Radiology:     Details: CT abdomen pelvis shows recent partial right nephrectomy, with postoperative hematoma, no evidence of any active bleeding, but associated developing abscess cannot be excluded, as well as having some a moderate amount of pelvic free fluid Discussion of management or test interpretation with external provider(s): Discussed with patient, urinalysis does not align, with urinary tract infection.  He does have gas, on his CT, and with the chills, nausea/vomiting/worsening abdominal pain, we will start him on ceftriaxone and Flagyl, discussed with Dr. Lafonda Mosses, given drop in hemoglobin, possibly infectious symptoms, will admit to hospitalist, Dr. Haroldine Laws, and have urology see in the AM.  Started on ceftriaxone and Flagyl, given concern for possible intra-abdominal infection, given chills, and abdominal pain.  Risk Prescription drug management. Decision regarding hospitalization.    Final Clinical Impression(s) / ED Diagnoses Final diagnoses:  Anemia, unspecified type  Post-operative pain  Pelvic fluid collection  Hematoma of right kidney, initial encounter    Rx / DC Orders ED Discharge Orders     None         Iysis Germain, [place], PA 11/02/23 0302    Tilden Fossa, MD 11/03/23 [phone]

## 2023-11-01 NOTE — ED Provider Triage Note (Signed)
Emergency Medicine Provider Triage Evaluation Note  Stephen Chase , a 58 y.o. male  was evaluated in triage.  Pt complains of 2 days of 1 episode of vomiting, dysuria, RLQ abdominal pain after having partial nephrectomy d/c 10/22/2023 and has noticed jaundice since the surgery. Hx of liver cirrhosis. Also states he feels weaker in L leg but Has been able to walk today.  Endorses chills Denies fever, headache, vision change, chest pain, shortness of breath, hematuria, diarrhea, hematochezia, melena.  Review of Systems  Positive: N/a Negative: N/a  Physical Exam  BP 118/69   Pulse 96   Temp 98.4 F (36.9 C) (Oral)   Resp 18   Ht 5\' 9"  (1.753 m)   Wt 95.7 kg   SpO2 99%   BMI 31.16 kg/m  Gen:   Awake, no distress   Resp:  Normal effort  MSK:   Moves extremities without difficulty  Other:    Medical Decision Making  Medically screening exam initiated at 4:59 PM.  Appropriate orders placed.  Stephen Chase was informed that the remainder of the evaluation will be completed by another provider, this initial triage assessment does not replace that evaluation, and the importance of remaining in the ED until their evaluation is complete.     [name], [place] 11/01/23 1711

## 2023-11-01 NOTE — ED Triage Notes (Signed)
 Pt had partial right kidney removal in January and 2 days ago pt wife started noticing pt started looking like he was jaundice. C/o painful urination and lower abdominal pain. Pt had general unwell feeling and chills.

## 2023-11-02 ENCOUNTER — Encounter (HOSPITAL_COMMUNITY): Payer: Self-pay

## 2023-11-02 ENCOUNTER — Emergency Department (HOSPITAL_COMMUNITY): Payer: Medicare PPO

## 2023-11-02 DIAGNOSIS — T148XXA Other injury of unspecified body region, initial encounter: Secondary | ICD-10-CM | POA: Diagnosis present

## 2023-11-02 DIAGNOSIS — E1122 Type 2 diabetes mellitus with diabetic chronic kidney disease: Secondary | ICD-10-CM | POA: Diagnosis present

## 2023-11-02 DIAGNOSIS — G4733 Obstructive sleep apnea (adult) (pediatric): Secondary | ICD-10-CM | POA: Diagnosis present

## 2023-11-02 DIAGNOSIS — E1142 Type 2 diabetes mellitus with diabetic polyneuropathy: Secondary | ICD-10-CM | POA: Diagnosis present

## 2023-11-02 DIAGNOSIS — Z905 Acquired absence of kidney: Secondary | ICD-10-CM

## 2023-11-02 DIAGNOSIS — E66811 Obesity, class 1: Secondary | ICD-10-CM | POA: Diagnosis present

## 2023-11-02 DIAGNOSIS — H903 Sensorineural hearing loss, bilateral: Secondary | ICD-10-CM | POA: Diagnosis present

## 2023-11-02 DIAGNOSIS — R319 Hematuria, unspecified: Secondary | ICD-10-CM | POA: Diagnosis present

## 2023-11-02 DIAGNOSIS — Z603 Acculturation difficulty: Secondary | ICD-10-CM | POA: Diagnosis present

## 2023-11-02 DIAGNOSIS — K219 Gastro-esophageal reflux disease without esophagitis: Secondary | ICD-10-CM | POA: Diagnosis present

## 2023-11-02 DIAGNOSIS — K7469 Other cirrhosis of liver: Secondary | ICD-10-CM | POA: Diagnosis present

## 2023-11-02 DIAGNOSIS — E6609 Other obesity due to excess calories: Secondary | ICD-10-CM | POA: Diagnosis present

## 2023-11-02 DIAGNOSIS — Z6831 Body mass index (BMI) 31.0-31.9, adult: Secondary | ICD-10-CM | POA: Diagnosis not present

## 2023-11-02 DIAGNOSIS — D62 Acute posthemorrhagic anemia: Secondary | ICD-10-CM | POA: Diagnosis present

## 2023-11-02 DIAGNOSIS — J302 Other seasonal allergic rhinitis: Secondary | ICD-10-CM | POA: Diagnosis present

## 2023-11-02 DIAGNOSIS — R300 Dysuria: Secondary | ICD-10-CM | POA: Diagnosis present

## 2023-11-02 DIAGNOSIS — Z79899 Other long term (current) drug therapy: Secondary | ICD-10-CM | POA: Diagnosis not present

## 2023-11-02 DIAGNOSIS — E119 Type 2 diabetes mellitus without complications: Secondary | ICD-10-CM

## 2023-11-02 DIAGNOSIS — N99840 Postprocedural hematoma of a genitourinary system organ or structure following a genitourinary system procedure: Secondary | ICD-10-CM | POA: Diagnosis present

## 2023-11-02 DIAGNOSIS — M4726 Other spondylosis with radiculopathy, lumbar region: Secondary | ICD-10-CM | POA: Diagnosis present

## 2023-11-02 DIAGNOSIS — I129 Hypertensive chronic kidney disease with stage 1 through stage 4 chronic kidney disease, or unspecified chronic kidney disease: Secondary | ICD-10-CM | POA: Diagnosis present

## 2023-11-02 DIAGNOSIS — E781 Pure hyperglyceridemia: Secondary | ICD-10-CM | POA: Diagnosis present

## 2023-11-02 DIAGNOSIS — N182 Chronic kidney disease, stage 2 (mild): Secondary | ICD-10-CM | POA: Diagnosis present

## 2023-11-02 DIAGNOSIS — Z85528 Personal history of other malignant neoplasm of kidney: Secondary | ICD-10-CM | POA: Diagnosis not present

## 2023-11-02 DIAGNOSIS — Z531 Procedure and treatment not carried out because of patient's decision for reasons of belief and group pressure: Secondary | ICD-10-CM | POA: Diagnosis present

## 2023-11-02 DIAGNOSIS — F17210 Nicotine dependence, cigarettes, uncomplicated: Secondary | ICD-10-CM | POA: Diagnosis present

## 2023-11-02 DIAGNOSIS — Y838 Other surgical procedures as the cause of abnormal reaction of the patient, or of later complication, without mention of misadventure at the time of the procedure: Secondary | ICD-10-CM | POA: Diagnosis present

## 2023-11-02 LAB — BASIC METABOLIC PANEL
Anion gap: 8 (ref 5–15)
BUN: 16 mg/dL (ref 6–20)
CO2: 25 mmol/L (ref 22–32)
Calcium: 8.5 mg/dL — ABNORMAL LOW (ref 8.9–10.3)
Chloride: 101 mmol/L (ref 98–111)
Creatinine, Ser: 1.23 mg/dL (ref 0.61–1.24)
GFR, Estimated: >60 mL/min (ref >60)
Glucose, Bld: 134 mg/dL — ABNORMAL HIGH (ref 70–99)
Potassium: 3.5 mmol/L (ref 3.5–5.1)
Sodium: 134 mmol/L — ABNORMAL LOW (ref 135–145)

## 2023-11-02 LAB — HEMOGLOBIN AND HEMATOCRIT, BLOOD
HCT: 22.2 % — ABNORMAL LOW (ref 39.0–52.0)
HCT: 23.3 % — ABNORMAL LOW (ref 39.0–52.0)
HCT: 24.8 % — ABNORMAL LOW (ref 39.0–52.0)
HCT: 23.7 % — ABNORMAL LOW (ref 39.0–52.0)
Hemoglobin: 7.1 g/dL — ABNORMAL LOW (ref 13.0–17.0)
Hemoglobin: 7.4 g/dL — ABNORMAL LOW (ref 13.0–17.0)
Hemoglobin: 7.8 g/dL — ABNORMAL LOW (ref 13.0–17.0)
Hemoglobin: 7.4 g/dL — ABNORMAL LOW (ref 13.0–17.0)

## 2023-11-02 LAB — CBG MONITORING, ED: Glucose-Capillary: 124 mg/dL — ABNORMAL HIGH (ref 70–99)

## 2023-11-02 LAB — PROTIME-INR
INR: 1.4 — ABNORMAL HIGH (ref 0.8–1.2)
Prothrombin Time: 17.1 s — ABNORMAL HIGH (ref 11.4–15.2)

## 2023-11-02 LAB — GLUCOSE, CAPILLARY
Glucose-Capillary: 155 mg/dL — ABNORMAL HIGH (ref 70–99)
Glucose-Capillary: 180 mg/dL — ABNORMAL HIGH (ref 70–99)

## 2023-11-02 MED ORDER — INSULIN ASPART 100 UNIT/ML IJ SOLN
0.0000 [IU] | Freq: Every day | INTRAMUSCULAR | Status: DC
  Filled 2023-11-02: qty 0.05

## 2023-11-02 MED ORDER — METRONIDAZOLE 500 MG/100ML IV SOLN
500.0000 mg | Freq: Once | INTRAVENOUS | Status: AC
  Administered 2023-11-02: 500 mg via INTRAVENOUS
  Filled 2023-11-02: qty 100

## 2023-11-02 MED ORDER — HYDROMORPHONE HCL 1 MG/ML IJ SOLN
0.5000 mg | INTRAMUSCULAR | Status: DC | PRN
  Administered 2023-11-02: 0.5 mg via INTRAVENOUS
  Filled 2023-11-02: qty 1

## 2023-11-02 MED ORDER — MODAFINIL 200 MG PO TABS
200.0000 mg | ORAL_TABLET | Freq: Every day | ORAL | Status: DC
Start: 2023-11-02 — End: 2023-11-04
  Administered 2023-11-03 – 2023-11-04 (×2): 200 mg via ORAL
  Filled 2023-11-02 (×3): qty 1

## 2023-11-02 MED ORDER — ACETAMINOPHEN 325 MG PO TABS
650.0000 mg | ORAL_TABLET | Freq: Four times a day (QID) | ORAL | Status: DC | PRN
  Administered 2023-11-02: 650 mg via ORAL
  Filled 2023-11-02: qty 2

## 2023-11-02 MED ORDER — INSULIN ASPART 100 UNIT/ML IJ SOLN
0.0000 [IU] | Freq: Three times a day (TID) | INTRAMUSCULAR | Status: DC
Start: 2023-11-02 — End: 2023-11-04
  Administered 2023-11-02 – 2023-11-03 (×2): 3 [IU] via SUBCUTANEOUS
  Administered 2023-11-03: 2 [IU] via SUBCUTANEOUS
  Administered 2023-11-03: 3 [IU] via SUBCUTANEOUS
  Administered 2023-11-04: 5 [IU] via SUBCUTANEOUS
  Administered 2023-11-04: 2 [IU] via SUBCUTANEOUS
  Filled 2023-11-02: qty 0.15

## 2023-11-02 MED ORDER — SODIUM CHLORIDE 0.9 % IV SOLN: 1.0000 g | Freq: Once | INTRAVENOUS | Status: DC

## 2023-11-02 MED ORDER — ACETAMINOPHEN 650 MG RE SUPP: 650.0000 mg | Freq: Four times a day (QID) | RECTAL | Status: DC | PRN

## 2023-11-02 MED ORDER — DOCUSATE SODIUM 100 MG PO CAPS
100.0000 mg | ORAL_CAPSULE | Freq: Once | ORAL | Status: AC
  Administered 2023-11-02: 100 mg via ORAL
  Filled 2023-11-02: qty 1

## 2023-11-02 MED ORDER — ONDANSETRON HCL 4 MG PO TABS: 4.0000 mg | ORAL_TABLET | Freq: Four times a day (QID) | ORAL | Status: DC | PRN

## 2023-11-02 MED ORDER — ICOSAPENT ETHYL 1 G PO CAPS
1.0000 g | ORAL_CAPSULE | Freq: Every day | ORAL | Status: DC
  Administered 2023-11-02 – 2023-11-04 (×3): 1 g via ORAL
  Filled 2023-11-02 (×4): qty 1

## 2023-11-02 MED ORDER — ATORVASTATIN CALCIUM 40 MG PO TABS
40.0000 mg | ORAL_TABLET | Freq: Every day | ORAL | Status: DC
  Administered 2023-11-02 – 2023-11-03 (×2): 40 mg via ORAL
  Filled 2023-11-02 (×2): qty 1

## 2023-11-02 MED ORDER — EZETIMIBE 10 MG PO TABS
10.0000 mg | ORAL_TABLET | Freq: Every day | ORAL | Status: DC
  Administered 2023-11-02 – 2023-11-04 (×3): 10 mg via ORAL
  Filled 2023-11-02 (×4): qty 1

## 2023-11-02 MED ORDER — TAMSULOSIN HCL 0.4 MG PO CAPS
0.4000 mg | ORAL_CAPSULE | Freq: Every day | ORAL | Status: DC
  Administered 2023-11-02 – 2023-11-04 (×3): 0.4 mg via ORAL
  Filled 2023-11-02 (×4): qty 1

## 2023-11-02 MED ORDER — ONDANSETRON HCL 4 MG/2ML IJ SOLN: 4.0000 mg | Freq: Four times a day (QID) | INTRAMUSCULAR | Status: DC | PRN

## 2023-11-02 MED ORDER — SODIUM CHLORIDE 0.9 % IV SOLN
2.0000 g | INTRAVENOUS | Status: DC
  Administered 2023-11-02 – 2023-11-03 (×2): 2 g via INTRAVENOUS
  Filled 2023-11-02 (×2): qty 20

## 2023-11-02 MED ORDER — IOHEXOL 300 MG/ML  SOLN
100.0000 mL | Freq: Once | INTRAMUSCULAR | Status: AC | PRN
  Administered 2023-11-02: 100 mL via INTRAVENOUS

## 2023-11-02 MED ORDER — FENOFIBRATE 160 MG PO TABS
160.0000 mg | ORAL_TABLET | Freq: Every day | ORAL | Status: DC
  Administered 2023-11-02 – 2023-11-04 (×3): 160 mg via ORAL
  Filled 2023-11-02 (×5): qty 1

## 2023-11-02 NOTE — ED Notes (Signed)
Pt and wife are asleep at this time.

## 2023-11-02 NOTE — ED Notes (Signed)
Spoke to daughter of patient, Tabitha Allen-Draft. Pt and family are very upset and overwhelmed about being in the ED for 16 hours. Pt and family was informed that a lot of pts are being held in the ED right now and that as soon as a room in available that pt will go upstairs. Daughter also expressed concern about not having questions answered about pts admission or plan of care. This Clinical research associate contacted the hospitalist to come and talk to family. Daughter stated that she would sign to her mother and pt via facetime. She also wanted to talk to a supervisor. Tia Alert was contacted and is in the room at this time. Daughters contact number is [phone].

## 2023-11-02 NOTE — ED Notes (Signed)
ED TO INPATIENT HANDOFF REPORT  ED Nurse Name and Phone #: Pennelope Bracken EMTP  S Name/Age/Gender Stephen Chase 58 y.o. male Room/Bed: WA06/WA06  Code Status   Code Status: Full Code  Home/SNF/Other Home Patient oriented to: self, place, time, and situation Is this baseline? Yes   Triage Complete: Triage complete  Chief Complaint Hematoma [T14.8XXA]  Triage Note Pt had partial right kidney removal in January and 2 days ago pt wife started noticing pt started looking like he was jaundice. C/o painful urination and lower abdominal pain. Pt had general unwell feeling and chills.    Allergies Allergies  Allergen Reactions   Other     Blood Refusal     Level of Care/Admitting Diagnosis ED Disposition     ED Disposition  Admit   Condition  --   Comment  Hospital Area: [facility] [[id number]]  Level of Care: Telemetry [5]  Admit to tele based on following criteria: Complex arrhythmia (Bradycardia/Tachycardia)  May admit patient to [facility] or [facility] if equivalent level of care is available:: Yes  Covid Evaluation: Asymptomatic - no recent exposure (last 10 days) testing not required  Diagnosis: Hematoma [[id number]]  Admitting Physician: Gery Pray [[id number]]  Attending Physician: Gery Pray [[id number]]  Certification:: I certify this patient will need inpatient services for at least 2 midnights  Expected Medical Readiness: 11/04/2023          B Medical/Surgery History Past Medical History:  Diagnosis Date   Allergy    Arthritis    SHOULDERS   Balanitis    Chronic back pain    CKD (chronic kidney disease)    Deafness, sensorineural, childhood onset    BILATERAL SINCE AGE 11  SECONDARY TO UNKNOWN ILLNESS   Esophageal dysphagia 11/11/2022   Esophageal varices without bleeding (HCC) 05/09/2018   GERD (gastroesophageal reflux disease)    Hyperlipidemia    Hypertension    Lumbar radiculopathy 02/18/2023   Lumbar spondylosis 05/07/2023   No blood  products    Patient is Jehovah Witness no blood products.   OSA on CPAP    SEVERE PER STUDY 03-09-2009   Right renal mass    Smokers' cough (HCC)    Type 2 diabetes mellitus (HCC)    Past Surgical History:  Procedure Laterality Date   CIRCUMCISION N/A 12/19/2013   Procedure: CIRCUMCISION ADULT;  Surgeon: Su Grand, MD;  Location: [facility];  Service: Urology;  Laterality: N/A;   ROBOTIC ASSITED PARTIAL NEPHRECTOMY Right 10/20/2023   Procedure: XI ROBOTIC ASSITED RIGHT PARTIAL NEPHRECTOMY;  Surgeon: [name], MD;  Location: [facility];  Service: Urology;  Laterality: Right;   SHOULDER SURGERY Right    SHOULDER SURGERY Left 10/15/2021   WRIST SURGERY Left      A IV Location/Drains/Wounds Patient Lines/Drains/Airways Status     Active Line/Drains/Airways     Name Placement date Placement time Site Days   Peripheral IV 11/01/23 20 G 1.88" Anterior;Right Forearm 11/01/23  2356  Forearm  1   Peripheral IV 11/02/23 20 G 1.88" Anterior;Left Forearm 11/02/23  0500  Forearm  less than 1   Incision - 4 Ports Abdomen 1: Upper 2: Right;Upper 3: Right;Mid 4: Right;Lower 10/20/23  1057  -- 13            Intake/Output Last 24 hours  Intake/Output Summary (Last 24 hours) at 11/02/2023 1508 Last data filed at 11/02/2023 0513 Gross per 24 hour  Intake 1000 ml  Output --  Net  1000 ml    Labs/Imaging Results for orders placed or performed during the hospital encounter of 11/01/23 (from the past 48 hours)  CBC with Differential     Status: Abnormal   Collection Time: 11/01/23  5:21 PM  Result Value Ref Range   WBC 9.1 4.0 - 10.5 K/uL   RBC 2.72 (L) 4.22 - 5.81 MIL/uL   Hemoglobin 8.0 (L) 13.0 - 17.0 g/dL   HCT 16.1 (L) 09.6 - 04.5 %   MCV 92.3 80.0 - 100.0 fL   MCH 29.4 26.0 - 34.0 pg   MCHC 31.9 30.0 - 36.0 g/dL   RDW 40.9 81.1 - 91.4 %   Platelets 383 150 - 400 K/uL   nRBC 0.3 (H) 0.0 - 0.2 %   Neutrophils Relative % 66 %   Neutro Abs 6.1 1.7 - 7.7  K/uL   Lymphocytes Relative 18 %   Lymphs Abs 1.6 0.7 - 4.0 K/uL   Monocytes Relative 6 %   Monocytes Absolute 0.5 0.1 - 1.0 K/uL   Eosinophils Relative 7 %   Eosinophils Absolute 0.6 (H) 0.0 - 0.5 K/uL   Basophils Relative 1 %   Basophils Absolute 0.1 0.0 - 0.1 K/uL   Immature Granulocytes 2 %   Abs Immature Granulocytes 0.21 (H) 0.00 - 0.07 K/uL    Comment: Performed at [facility], [address], [place], [place] [address]  Comprehensive metabolic panel     Status: Abnormal   Collection Time: 11/01/23  5:21 PM  Result Value Ref Range   Sodium 133 (L) 135 - 145 mmol/L   Potassium 3.8 3.5 - 5.1 mmol/L   Chloride 98 98 - 111 mmol/L   CO2 23 22 - 32 mmol/L   Glucose, Bld 167 (H) 70 - 99 mg/dL    Comment: Glucose reference range applies only to samples taken after fasting for at least 8 hours.   BUN 20 6 - 20 mg/dL   Creatinine, Ser 6.21 (H) 0.61 - 1.24 mg/dL   Calcium 8.9 8.9 - 30.8 mg/dL   Total Protein 7.6 6.5 - 8.1 g/dL   Albumin 3.9 3.5 - 5.0 g/dL   AST 27 15 - 41 U/L   ALT 28 0 - 44 U/L   Alkaline Phosphatase 68 38 - 126 U/L   Total Bilirubin 1.2 0.0 - 1.2 mg/dL   GFR, Estimated >65 >78 mL/min    Comment: (NOTE) Calculated using the CKD-EPI Creatinine Equation (2021)    Anion gap 12 5 - 15    Comment: Performed at [facility], [address] [place], [place] [address]  Lipase, blood     Status: None   Collection Time: 11/01/23  5:21 PM  Result Value Ref Range   Lipase 44 11 - 51 U/L    Comment: Performed at [facility], [address] [place], [place] [address]  Ammonia     Status: Abnormal   Collection Time: 11/01/23  5:21 PM  Result Value Ref Range   Ammonia 40 (H) 9 - 35 umol/L    Comment: Performed at [facility], [address] [place], [place] [address]  Urinalysis, Routine w reflex microscopic -Urine, Clean Catch     Status: Abnormal   Collection Time: 11/01/23  5:42 PM   Result Value Ref Range   Color, Urine YELLOW YELLOW   APPearance CLEAR CLEAR   Specific Gravity, Urine 1.028 1.005 - 1.030   pH 5.0 5.0 - 8.0  Glucose, UA >=500 (A) NEGATIVE mg/dL   Hgb urine dipstick MODERATE (A) NEGATIVE   Bilirubin Urine NEGATIVE NEGATIVE   Ketones, ur NEGATIVE NEGATIVE mg/dL   Protein, ur NEGATIVE NEGATIVE mg/dL   Nitrite NEGATIVE NEGATIVE   Leukocytes,Ua NEGATIVE NEGATIVE   RBC / HPF 11-20 0 - 5 RBC/hpf   WBC, UA 0-5 0 - 5 WBC/hpf   Bacteria, UA NONE SEEN NONE SEEN   Squamous Epithelial / HPF 0-5 0 - 5 /HPF    Comment: Performed at [facility], [address] [place], [place] [address]  Blood culture (routine x 2)     Status: None (Preliminary result)   Collection Time: 11/02/23  4:50 AM   Specimen: BLOOD LEFT ARM  Result Value Ref Range   Specimen Description      BLOOD LEFT ARM Performed at [facility], [address], [place], [place] [address]    Special Requests      BOTTLES DRAWN AEROBIC AND ANAEROBIC Blood Culture adequate volume Performed at [facility], [address] [place], [place] [address]    Culture PENDING    Report Status PENDING   Blood culture (routine x 2)     Status: None (Preliminary result)   Collection Time: 11/02/23  5:00 AM   Specimen: BLOOD RIGHT ARM  Result Value Ref Range   Specimen Description      BLOOD RIGHT ARM Performed at [facility], [address], [place], [place] [address]    Special Requests      BOTTLES DRAWN AEROBIC AND ANAEROBIC Blood Culture adequate volume Performed at [facility], [address], [place], [place] [address]    Culture PENDING    Report Status PENDING   Protime-INR     Status: Abnormal   Collection Time: 11/02/23  5:25 AM  Result Value Ref Range   Prothrombin Time 17.1 (H) 11.4 - 15.2 seconds   INR 1.4 (H) 0.8 - 1.2    Comment: (NOTE) INR goal varies based on device and disease states. Performed at [facility], [address] [place], [place] [address]   Hemoglobin and hematocrit, blood     Status: Abnormal   Collection Time: 11/02/23  5:25 AM  Result Value Ref Range   Hemoglobin 7.1 (L) 13.0 - 17.0 g/dL   HCT 64.4 (L) 03.4 - 74.2 %    Comment: Performed at [facility], [address], [place], [place] [address]  Basic metabolic panel     Status: Abnormal   Collection Time: 11/02/23  5:25 AM  Result Value Ref Range   Sodium 134 (L) 135 - 145 mmol/L   Potassium 3.5 3.5 - 5.1 mmol/L   Chloride 101 98 - 111 mmol/L   CO2 25 22 - 32 mmol/L   Glucose, Bld 134 (H) 70 - 99 mg/dL    Comment: Glucose reference range applies only to samples taken after fasting for at least 8 hours.   BUN 16 6 - 20 mg/dL   Creatinine, Ser 8.75 0.61 - 1.24 mg/dL   Calcium 8.5 (L) 8.9 - 10.3 mg/dL   GFR, Estimated >64 >33 mL/min    Comment: (NOTE) Calculated using the CKD-EPI Creatinine Equation (2021)    Anion gap 8 5 - 15    Comment: Performed at [facility], [address] [place], [place] [address]  CBG monitoring, ED     Status: Abnormal   Collection Time:  11/02/23  8:00 AM  Result Value Ref Range   Glucose-Capillary 124 (H) 70 - 99 mg/dL    Comment: Glucose reference range applies only to samples taken after fasting for at least 8 hours.  Hemoglobin and hematocrit, blood     Status: Abnormal   Collection Time: 11/02/23 11:41 AM  Result Value Ref Range   Hemoglobin 7.4 (L) 13.0 - 17.0 g/dL   HCT 16.1 (L) 09.6 - 04.5 %    Comment: Performed at [facility], [address], [place], [place] [address]   CT ABDOMEN PELVIS W CONTRAST Result Date: 11/02/2023 CLINICAL DATA:  Partial right kidney removal on October 20, 2023, presenting with painful urination and lower abdominal pain. EXAM: CT ABDOMEN AND PELVIS WITH CONTRAST TECHNIQUE: Multidetector CT imaging of the abdomen and pelvis was performed using the standard protocol  following bolus administration of intravenous contrast. RADIATION DOSE REDUCTION: This exam was performed according to the departmental dose-optimization program which includes automated exposure control, adjustment of the mA and/or kV according to patient size and/or use of iterative reconstruction technique. CONTRAST:  OMNIPAQUE IOHEXOL 300 MG/ML  SOLN COMPARISON:  June 04, 2023 FINDINGS: Lower chest: No acute abnormality. Hepatobiliary: No focal liver abnormality is seen. No gallstones, gallbladder wall thickening, or biliary dilatation. Pancreas: Unremarkable. No pancreatic ductal dilatation or surrounding inflammatory changes. Spleen: Normal in size without focal abnormality. Adrenals/Urinary Tract: Adrenal glands are unremarkable. The left kidney is normal, without obstructing renal calculi, focal lesion, or hydronephrosis. Evidence of the patient's recent partial right nephrectomy is seen with a 5.1 cm x 4.3 cm x 6.4 cm area of heterogeneous low attenuation (approximately 55.21 Hounsfield units) noted along the posterior and lateral aspects of the mid and upper right kidney. This area contains numerous small foci of air and connects with a mild-to-moderate amount of perinephric fluid seen extending from the mid right kidney to the lower pole of the right kidney (approximately 44.40 Hounsfield units). A mild amount of posterior paranephric fluid is also present (approximately 60.07 Hounsfield units). Moderate severity, predominantly anterior urinary bladder wall thickening is seen. Stomach/Bowel: Stomach is within normal limits. Appendix appears normal. No evidence of bowel wall thickening, distention, or inflammatory changes. Numerous noninflamed diverticula are seen throughout the ascending colon Vascular/Lymphatic: Aortic atherosclerosis. No enlarged abdominal or pelvic lymph nodes. Reproductive: The prostate gland is mildly enlarged. Other: A mild amount of para muscular air is seen within the  subcutaneous fat of the anterior abdominal wall on the right. A mild to moderate amount of perihepatic (approximately 71.25 Hounsfield units) and perisplenic fluid (approximately 51.95 Hounsfield units) is seen. A moderate amount of pelvic free fluid is also noted (approximately 37.52 Hounsfield units). Musculoskeletal: Multilevel degenerative changes are seen throughout the lumbar spine. IMPRESSION: 1. Evidence of the patient's recent partial right nephrectomy with a 5.1 cm x 4.3 cm x 6.4 cm postoperative hematoma seen along the posterior and lateral aspects of the mid and upper right kidney. While there is no definite evidence to suggest active bleeding, and associated developing abscess cannot be excluded. 2. Moderate severity, predominantly anterior urinary bladder wall thickening which may represent sequelae associated with cystitis. Correlation with urinalysis is recommended. 3. Mild to moderate amount of perihepatic and perisplenic fluid, as well as a moderate amount of pelvic free fluid. A hemorrhagic component cannot be excluded. 4. Colonic diverticulosis. 5. Aortic atherosclerosis. Aortic Atherosclerosis (ICD10-I70.0). Electronically Signed   By: Aram Candela M.D.   On: 11/02/2023 01:33    Pending  Labs [employer] (From admission, onward)     Start     Ordered   11/02/23 0405  Hemoglobin and hematocrit, blood  Now then every 4 hours,   R      11/02/23 0404   11/02/23 0139  Type and screen [facility]  Once,   STAT       Comments: [facility]    11/02/23 0138            Vitals/Pain Today's Vitals   11/02/23 0815 11/02/23 0945 11/02/23 0958 11/02/23 1412  BP: 114/62  124/68 (!) 105/56  Pulse: 79 74 72 83  Resp:   12 14  Temp:   98.4 F (36.9 C) 98 F (36.7 C)  TempSrc:   Oral Oral  SpO2: 96% 97% 98% 96%  Weight:      Height:      PainSc:   Asleep     Isolation Precautions No active isolations  Medications Medications   atorvastatin (LIPITOR) tablet 40 mg (has no administration in time range)  fenofibrate tablet 160 mg (has no administration in time range)  ezetimibe (ZETIA) tablet 10 mg (has no administration in time range)  modafinil (PROVIGIL) tablet 200 mg (has no administration in time range)  tamsulosin (FLOMAX) capsule 0.4 mg (has no administration in time range)  icosapent Ethyl (VASCEPA) 1 g capsule 1 g (has no administration in time range)  insulin aspart (novoLOG) injection 0-15 Units (has no administration in time range)  insulin aspart (novoLOG) injection 0-5 Units (has no administration in time range)  acetaminophen (TYLENOL) tablet 650 mg (has no administration in time range)    Or  acetaminophen (TYLENOL) suppository 650 mg (has no administration in time range)  ondansetron (ZOFRAN) tablet 4 mg (has no administration in time range)    Or  ondansetron (ZOFRAN) injection 4 mg (has no administration in time range)  cefTRIAXone (ROCEPHIN) 2 g in sodium chloride 0.9 % 100 mL IVPB (0 g Intravenous Stopped 11/02/23 0734)  HYDROmorphone (DILAUDID) injection 0.5 mg (0.5 mg Intravenous Given 11/02/23 0841)  fentaNYL (SUBLIMAZE) injection 50 mcg (50 mcg Intravenous Given 11/02/23 0000)  sodium chloride 0.9 % bolus 1,000 mL (0 mLs Intravenous Stopped 11/02/23 0513)  iohexol (OMNIPAQUE) 300 MG/ML solution 100 mL (100 mLs Intravenous Contrast Given 11/02/23 0041)  metroNIDAZOLE (FLAGYL) IVPB 500 mg (0 mg Intravenous Stopped 11/02/23 0717)    Mobility walks     Focused Assessments See chart   R Recommendations: See Admitting Provider Note  Report given to:   Additional Notes: Pt deaf and will require ASL interpreter

## 2023-11-02 NOTE — ED Notes (Signed)
In person interpreter was contacted. Resources are working to get one on site for this patient and family.

## 2023-11-02 NOTE — H&P (Signed)
History and Physical    Patient: Stephen Chase VFI:[id number] DOB: [birth year] DOA: 11/01/2023 DOS: the patient was seen and examined on 11/02/2023 PCP: [name], FNP  Patient coming from: Home  Chief Complaint:  Chief Complaint  Patient presents with   Post-op Problem   Jaundice   HPI: Stephen Chase is a 58 y.o. male with medical history significant of seasonal allergies, osteoarthritis, rhinitis, chronic back pain, lumbar spondylosis with radiculopathy, chronic kidney disease, sensorineural deafness, GERD, esophageal dysphagia, esophageal varices without bleeding, hyperlipidemia, hypertension, OSA on CPAP, smoker's cough, COVID-19, type 2 diabetes, diabetic peripheral neuropathy, class I obesity who underwent a robotic assisted lap right partial nephrectomy for a right renal mass which pathology has diagnosed as clear-cell renal cell carcinoma who presented to the emergency department due to dysuria, hematuria, lower abdominal pain, chills and also his wife thought that he was looking jaundiced.  He has not had a great appetite.  No fever.  Had a headache earlier, but is better now.  No rhinorrhea, sore throat, dyspnea, wheezing, cough or chest pain.  No diarrhea, constipation, melena or hematochezia.  Lab work: Urinalysis showed greater than 500 glucose, moderate hemoglobin and 11-20 RBCs with no bacteria microscopic examination.  CBC showed a white count of 9.1, hemoglobin 8.0 g/dL (follow-up 7.1 g/dL) and platelets 416.  PT 17.1 and INR 1.4.  Lipase was normal.  Ammonia was 40 mol/L.  CMP showed a glucose of 167 and creatinine of 1.36 mg/deciliter, the rest of the CMP measurements are normal after sodium correction.  Imaging: CT abdomen/pelvis with contrast showing evidence of recent partial right nephrectomy with 5.1 x 4.3 x 6.4 postop hematoma seen along the posterior lateral aspect of the mid and upper right kidney.  While there is no definite evidence to suggest active bleeding, an  associated developing abscess cannot be excluded.  Moderate severity, predominantly anterior urinary bladder wall thickening which may represent sequela associated with cystitis.  Correlation with urinalysis recommended.  Mild to moderate amount of perihepatic and perisplenic fluid, as well as a moderate amount of pelvic free fluid.  Hemorrhagic component cannot be excluded.  Colonic diverticulosis.  Aortic atherosclerosis.  ED course: Initial vital signs were temperature 98.4 F, pulse 96, respiration 19, BP 118/69 mmHg O2 sat 99% on room air.  The patient received fentanyl 50 mcg IVP x 1, ceftriaxone 2 g IVPB, metronidazole 500 mg IVPB and 1000 mL of normal saline bolus.   Review of Systems: As mentioned in the history of present illness. All other systems reviewed and are negative. Past Medical History:  Diagnosis Date   Allergy    Arthritis    SHOULDERS   Balanitis    Chronic back pain    CKD (chronic kidney disease)    Deafness, sensorineural, childhood onset    BILATERAL SINCE AGE 59  SECONDARY TO UNKNOWN ILLNESS   GERD (gastroesophageal reflux disease)    Hyperlipidemia    Hypertension    No blood products    Patient is Jehovah Witness no blood products.   OSA on CPAP    SEVERE PER STUDY 03-09-2009   Right renal mass    Smokers' cough (HCC)    Type 2 diabetes mellitus (HCC)    Past Surgical History:  Procedure Laterality Date   CIRCUMCISION N/A 12/19/2013   Procedure: CIRCUMCISION ADULT;  Surgeon: Su Grand, MD;  Location: [facility];  Service: Urology;  Laterality: N/A;   ROBOTIC ASSITED PARTIAL NEPHRECTOMY Right 10/20/2023   Procedure: XI ROBOTIC  ASSITED RIGHT PARTIAL NEPHRECTOMY;  Surgeon: [name], MD;  Location: [facility];  Service: Urology;  Laterality: Right;   SHOULDER SURGERY Right    SHOULDER SURGERY Left 10/15/2021   WRIST SURGERY Left    Social History:  reports that he has quit smoking. His smoking use included cigarettes. He has a  30 pack-year smoking history. He has never used smokeless tobacco. He reports that he does not currently use alcohol. He reports that he does not currently use drugs.  Allergies  Allergen Reactions   Other     Blood Refusal     Family History  Problem Relation Age of Onset   Cancer Mother        type unknown pt was 60 years old at that time   Liver disease Father    Healthy Brother    Cancer Other         alot of family members have cancer but doesnt know the type   Colon cancer Neg Hx    Esophageal cancer Neg Hx    Rectal cancer Neg Hx    Stomach cancer Neg Hx    Sleep apnea Neg Hx     Prior to Admission medications   Medication Sig Start Date End Date Taking? Authorizing Provider  atorvastatin (LIPITOR) 40 MG tablet TAKE 1 TABLET(40 MG) BY MOUTH AT BEDTIME 05/06/23  Yes [name], FNP  carvedilol (COREG) 3.125 MG tablet Take 1 tablet (3.125 mg total) by mouth 2 (two) times daily. 09/29/23 12/28/23 Yes Hilty, Lisette Abu, MD  Dapagliflozin Propanediol (FARXIGA PO) Take 10 mg by mouth daily.   Yes [provider]  docusate sodium (COLACE) 100 MG capsule Take 1 capsule (100 mg total) by mouth 2 (two) times daily. 10/20/23  Yes Dancy, Marchelle Folks, PA-C  ezetimibe (ZETIA) 10 MG tablet Take 1 tablet (10 mg total) by mouth daily. 09/29/23 12/28/23 Yes Hilty, Lisette Abu, MD  fenofibrate (TRICOR) 145 MG tablet TAKE 1 TABLET(145 MG) BY MOUTH DAILY 04/08/23  Yes [name], FNP  HYDROcodone-acetaminophen (NORCO) 5-325 MG tablet Take 1-2 tablets by mouth every 6 (six) hours as needed for moderate pain (pain score 4-6) or severe pain (pain score 7-10). 10/20/23  Yes Dancy, Marchelle Folks, PA-C  icosapent Ethyl (VASCEPA) 1 g capsule One bid for elevated triglycerides 05/18/23  Yes Hilty, Lisette Abu, MD  modafinil (PROVIGIL) 200 MG tablet Take 1 tablet (200 mg total) by mouth daily. 05/21/23  Yes Butch Penny, NP  omeprazole (PRILOSEC) 10 MG capsule TAKE 1 CAPSULE(10 MG) BY MOUTH DAILY 04/12/23  Yes [name], FNP  Accu-Chek Softclix Lancets lancets Use as instructed to test blood sugars 3 times a day dx code e11.65 09/01/21   [name], FNP  Blood Glucose Monitoring Suppl (ACCU-CHEK GUIDE) w/Device KIT Use to check blood sugars three times daily E11.9 02/06/20   [name], FNP  glucose blood (ACCU-CHEK GUIDE) test strip CHECK BLOOD SUGAR 3 TIMES A DAY BEFORE BREAKFAST, LUNCH, AND DINNER 10/11/20   [name], FNP  glucose blood test strip Use as instructed to test blood sugars 3 times a day dx code e11.65 09/30/23   [name], FNP  tamsulosin (FLOMAX) 0.4 MG CAPS capsule TAKE 1 CAPSULE BY MOUTH EVERY DAY 30 MINUTES AFTER THE SAME MEAL 06/08/23   [name], FNP  UNABLE TO FIND CPAP for OSA (6cm-19cm)    [provider]    Physical Exam: Vitals:   11/02/23 0028 11/02/23 0256 11/02/23 0649 11/02/23 0715  BP:  113/66 117/66 (!) 103/57 120/64  Pulse: 81 83 82 77  Resp: 18 17 12    Temp: 98.1 F (36.7 C) 98.2 F (36.8 C) 98.2 F (36.8 C)   TempSrc: Oral Oral Oral   SpO2: 99% 100% 100% 98%  Weight:      Height:       Physical Exam Constitutional:      General: He is awake. He is not in acute distress.    Appearance: He is obese. He is ill-appearing.  HENT:     Head: Normocephalic.     Nose: No rhinorrhea.     Mouth/Throat:     Mouth: Mucous membranes are moist.  Eyes:     General: No scleral icterus.    Pupils: Pupils are equal, round, and reactive to light.  Neck:     Vascular: No JVD.  Cardiovascular:     Rate and Rhythm: Normal rate and regular rhythm.     Heart sounds: S1 normal and S2 normal.  Pulmonary:     Breath sounds: No wheezing, rhonchi or rales.  Abdominal:     General: Abdomen is protuberant. Bowel sounds are normal. There is no distension.     Palpations: Abdomen is soft.     Tenderness: There is abdominal tenderness in the right upper quadrant. There is right CVA tenderness. There is no left CVA tenderness.  Musculoskeletal:      Cervical back: Neck supple.     Right lower leg: No edema.     Left lower leg: No edema.  Skin:    General: Skin is warm and dry.  Neurological:     General: No focal deficit present.     Mental Status: He is alert and oriented to person, place, and time.  Psychiatric:        Mood and Affect: Mood normal.        Behavior: Behavior normal. Behavior is cooperative.     Data Reviewed:  Results are pending, will review when available.  Assessment and Plan: Principal Problem:   ABLA (acute blood loss anemia)  Secondary to:   Hematoma After    Status post nephrectomy (laparoscopic robotic partial nephrectomy) Declined blood transfusion. No albumin since it is derived from blood. Admit to telemetry/inpatient. Keep NPO for now. Continue IV fluids. Monitor H&H. -No blood products. -With need embolization per urology. Continue ceftriaxone 2 g IVPB daily. Urology consult appreciated.  Active Problems:   OSA on CPAP CPAP at bedtime.    Bilateral deafness Use sign Presenter, broadcasting.    Other cirrhosis of liver (HCC) Monitor LFTs. Follow-up with PCP or hepatology as an outpatient.    Essential hypertension Will hold carvedilol for now given soft BP measurements.    Hypertriglyceridemia Continue fenofibrate 145 mg p.o. daily. Continue ezetimibe 10 mg p.o. daily. Continue atorvastatin 40 mg p.o. daily. Continue icosapent ethyl 1 g p.o. twice daily.    Class 1 obesity due to excess calories with  body mass index (BMI) of 31.0 to 31.9 in adult Current BMI 31.16 kg/m. Would benefit from lifestyle modifications. Follow-up with PCP.    T2DM (type 2 diabetes mellitus) (HCC) Currently NPO. Hold [place]. CBG monitoring every 6 hours.    Lumbar spondylosis   Lumbar radiculopathy Analgesics as needed.    Diabetic peripheral neuropathy (HCC) Analgesics as needed.      Advance Care Planning:   Code Status: Full Code   Consults: Urology Cristal Deer Liliane Shi,  MD)  Family Communication:   Severity of Illness: The  appropriate patient status for this patient is INPATIENT. Inpatient status is judged to be reasonable and necessary in order to provide the required intensity of service to ensure the patient's safety. The patient's presenting symptoms, physical exam findings, and initial radiographic and laboratory data in the context of their chronic comorbidities is felt to place them at high risk for further clinical deterioration. Furthermore, it is not anticipated that the patient will be medically stable for discharge from the hospital within 2 midnights of admission.   * I certify that at the point of admission it is my clinical judgment that the patient will require inpatient hospital care spanning beyond 2 midnights from the point of admission due to high intensity of service, high risk for further deterioration and high frequency of surveillance required.*  Author: Bobette Mo, MD 11/02/2023 8:10 AM  For on call review [email or web address].   This document was prepared using Dragon voice recognition software and may contain some unintended transcription errors.

## 2023-11-02 NOTE — ED Notes (Addendum)
Deaf interpreter will be here at 10:30am this morning.

## 2023-11-02 NOTE — ED Notes (Signed)
Urine is at bedside. Pt ambulated to restroom without assistance and with a steady gait.

## 2023-11-02 NOTE — Progress Notes (Signed)
Repeat H&H shows interval improvement from 7.1-->7.4 g/dL. Continue to check this every four hours. Please be sure NOT to perform blood draws on the same arm as IV fluids. Diet provided. Will proceed with CTA if the is an acute change. Notify Urology of any acute onset of tachycardia or hemodynamic instability.

## 2023-11-02 NOTE — Consult Note (Signed)
Urology Consult Note   Requesting Attending Physician:  Bobette Mo, MD Service Providing Consult: Urology  Consulting Attending: Dr. Marlou Porch   Reason for Consult:  perinephric hematoma- s/p partial nephrectomy  HPI: Stephen Chase is seen in consultation for reasons noted above at the request of Bobette Mo, MD. Patient and wife are hearing impaired and patient interview was conducted with a video interpreter.  Patient is followed by Dr. Liliane Shi and recently underwent right partial nephrectomy and treatment of a 3.8 x 5.4 cm solid enhancing mass concerning for renal cell carcinoma.  Discharge from hospital on 10/22/2023, stable and having met all postoperative goals.  He presents to [facility] emergency department on 11/01/2023 with complaint of abdominal pain and flank pain since surgery.  He denies hematuria, has pain urinating, and reports surgical pain to be variable.  He has had 2 episodes of vomiting and wife reports that he has looked pale for the last 2 days.  In reaching out to [facility] urology was directed to the emergency department for formal assessment.  CT A/P revealed a 5 x 1 x 4 0.3 x 6.4 postoperative hematoma seen along the posterior lateral aspect of the mid and upper right kidney.  Patient is hemodynamically stable.  Of note he is also a Jehovah's Witness and will not consider transfusion of blood products.  His hemoglobin is very borderline at 7.1.  We his case, plan, and available options that did not include blood products.  All questions were answered to their satisfaction.  ------------------  Assessment:  58 y.o. male with post operative right perinephric hematoma   Recommendations: #s/p right partial nephrectomy #right perinephric hematoma  Serial H&H- Q4. Next result should be noon  Somewhat low hemoglobin drop overnight.  Unclear whether or not this is a stable bleed and Hgb drop is representative of dilution or patient will need urgent  intervention.  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  He is steadfast in this position not to receive blood.  The shared decision was made to reassess hemoglobin midday and if there is evidence of ongoing bleeding a CTA will be collected following consult to interventional radiology for embolization.  He is aware that if it comes to embolization that the attempt will be made for a high level of selection, but ultimately the remaining kidney may need to be embolized if a specific location cannot be identified.  Case and plan discussed with Dr. Liliane Shi  Past Medical History: Past Medical History:  Diagnosis Date   Allergy    Arthritis    SHOULDERS   Balanitis    Chronic back pain    CKD (chronic kidney disease)    Deafness, sensorineural, childhood onset    BILATERAL SINCE AGE 3  SECONDARY TO UNKNOWN ILLNESS   Esophageal dysphagia 11/11/2022   Esophageal varices without bleeding (HCC) 05/09/2018   GERD (gastroesophageal reflux disease)    Hyperlipidemia    Hypertension    Lumbar radiculopathy 02/18/2023   Lumbar spondylosis 05/07/2023   No blood products    Patient is Jehovah Witness no blood products.   OSA on CPAP    SEVERE PER STUDY 03-09-2009   Right renal mass    Smokers' cough (HCC)    Type 2 diabetes mellitus (HCC)     Past Surgical History:  Past Surgical History:  Procedure Laterality Date   CIRCUMCISION N/A 12/19/2013   Procedure: CIRCUMCISION  ADULT;  Surgeon: Su Grand, MD;  Location: [facility];  Service: Urology;  Laterality: N/A;   ROBOTIC ASSITED PARTIAL NEPHRECTOMY Right 10/20/2023   Procedure: XI ROBOTIC ASSITED RIGHT PARTIAL NEPHRECTOMY;  Surgeon: [name], MD;  Location: [facility];  Service: Urology;  Laterality: Right;   SHOULDER SURGERY Right    SHOULDER SURGERY Left 10/15/2021   WRIST SURGERY Left     Medication: Current  Facility-Administered Medications  Medication Dose Route Frequency Provider Last Rate Last Admin   acetaminophen (TYLENOL) tablet 650 mg  650 mg Oral Q6H PRN Crosley, Debby, MD       Or   acetaminophen (TYLENOL) suppository 650 mg  650 mg Rectal Q6H PRN Crosley, Debby, MD       atorvastatin (LIPITOR) tablet 40 mg  40 mg Oral QHS Crosley, Debby, MD       cefTRIAXone (ROCEPHIN) 2 g in sodium chloride 0.9 % 100 mL IVPB  2 g Intravenous Q24H Gery Pray, MD   Stopped at 11/02/23 0734   ezetimibe (ZETIA) tablet 10 mg  10 mg Oral Daily Crosley, Debby, MD       fenofibrate tablet 160 mg  160 mg Oral Daily Crosley, Debby, MD       HYDROmorphone (DILAUDID) injection 0.5 mg  0.5 mg Intravenous Q3H PRN Joneen Roach, Debby, MD   0.5 mg at 11/02/23 0841   icosapent Ethyl (VASCEPA) 1 g capsule 1 g  1 g Oral Q1200 Crosley, Debby, MD       insulin aspart (novoLOG) injection 0-15 Units  0-15 Units Subcutaneous TID WC Crosley, Debby, MD       insulin aspart (novoLOG) injection 0-5 Units  0-5 Units Subcutaneous QHS Crosley, Debby, MD       modafinil (PROVIGIL) tablet 200 mg  200 mg Oral Daily Crosley, Debby, MD       ondansetron (ZOFRAN) tablet 4 mg  4 mg Oral Q6H PRN Crosley, Debby, MD       Or   ondansetron (ZOFRAN) injection 4 mg  4 mg Intravenous Q6H PRN Crosley, Debby, MD       tamsulosin (FLOMAX) capsule 0.4 mg  0.4 mg Oral Daily Crosley, Debby, MD       Current Outpatient Medications  Medication Sig Dispense Refill   atorvastatin (LIPITOR) 40 MG tablet TAKE 1 TABLET(40 MG) BY MOUTH AT BEDTIME     carvedilol (COREG) 3.125 MG tablet Take 1 tablet (3.125 mg total) by mouth 2 (two) times daily. 180 tablet 3   Dapagliflozin Propanediol (FARXIGA PO) Take 10 mg by mouth daily.     docusate sodium (COLACE) 100 MG capsule Take 1 capsule (100 mg total) by mouth 2 (two) times daily.     ezetimibe (ZETIA) 10 MG tablet Take 1 tablet (10 mg total) by mouth daily. 90 tablet 3   fenofibrate (TRICOR) 145 MG tablet TAKE 1  TABLET(145 MG) BY MOUTH DAILY 90 tablet 1   HYDROcodone-acetaminophen (NORCO) 5-325 MG tablet Take 1-2 tablets by mouth every 6 (six) hours as needed for moderate pain (pain score 4-6) or severe pain (pain score 7-10). 20 tablet 0   icosapent Ethyl (VASCEPA) 1 g capsule One bid for elevated triglycerides 180 capsule 3   modafinil (PROVIGIL) 200 MG tablet Take 1 tablet (200 mg total) by mouth daily. 30 tablet 5   omeprazole (PRILOSEC) 10 MG capsule TAKE 1 CAPSULE(10 MG) BY MOUTH DAILY 90 capsule 1   Accu-Chek Softclix Lancets lancets Use as instructed to test blood  sugars 3 times a day dx code e11.65 200 each 3   Blood Glucose Monitoring Suppl (ACCU-CHEK GUIDE) w/Device KIT Use to check blood sugars three times daily E11.9 1 kit 0   glucose blood (ACCU-CHEK GUIDE) test strip CHECK BLOOD SUGAR 3 TIMES A DAY BEFORE BREAKFAST, LUNCH, AND DINNER 150 strip 2   glucose blood test strip Use as instructed to test blood sugars 3 times a day dx code e11.65 100 each 12   tamsulosin (FLOMAX) 0.4 MG CAPS capsule TAKE 1 CAPSULE BY MOUTH EVERY DAY 30 MINUTES AFTER THE SAME MEAL 90 capsule 3   UNABLE TO FIND CPAP for OSA (6cm-19cm)      Allergies: Allergies  Allergen Reactions   Other     Blood Refusal     Social History: Social History   Tobacco Use   Smoking status: Former    Current packs/day: 1.00    Average packs/day: 1 pack/day for 30.0 years (30.0 ttl pk-yrs)    Types: Cigarettes   Smokeless tobacco: Never   Tobacco comments:    quit 4 years ago  Vaping Use   Vaping status: Never Used  Substance Use Topics   Alcohol use: Not Currently    Comment: rarely   Drug use: Not Currently    Family History Family History  Problem Relation Age of Onset   Cancer Mother        type unknown pt was 71 years old at that time   Liver disease Father    Healthy Brother    Cancer Other         alot of family members have cancer but doesnt know the type   Colon cancer Neg Hx    Esophageal cancer  Neg Hx    Rectal cancer Neg Hx    Stomach cancer Neg Hx    Sleep apnea Neg Hx     Review of Systems  Gastrointestinal:  Positive for abdominal pain and nausea.  Genitourinary:  Positive for flank pain.     Objective   Vital signs in last 24 hours: BP 124/68   Pulse 72   Temp 98.4 F (36.9 C) (Oral)   Resp 12   Ht 5\' 9"  (1.753 m)   Wt 95.7 kg   SpO2 98%   BMI 31.16 kg/m   Physical Exam General: A&O, resting, appropriate HEENT: [place]/AT Pulmonary: Normal work of breathing Cardiovascular: no cyanosis Abdomen: Soft, NTTP, nondistended, port sites are clean/dry/intact with no erythema or purulent drainage   Most Recent Labs: Lab Results  Component Value Date   WBC 9.1 11/01/2023   HGB 7.1 (L) 11/02/2023   HCT 22.2 (L) 11/02/2023   PLT 383 11/01/2023    Lab Results  Component Value Date   NA 134 (L) 11/02/2023   K 3.5 11/02/2023   CL 101 11/02/2023   CO2 25 11/02/2023   BUN 16 11/02/2023   CREATININE 1.23 11/02/2023   CALCIUM 8.5 (L) 11/02/2023   MG 1.9 04/10/2020    Lab Results  Component Value Date   INR 1.4 (H) 11/02/2023     Urine Culture: @[id number] (laburin,org,[id number])@   IMAGING: CT ABDOMEN PELVIS W CONTRAST Result Date: 11/02/2023 CLINICAL DATA:  Partial right kidney removal on October 20, 2023, presenting with painful urination and lower abdominal pain. EXAM: CT ABDOMEN AND PELVIS WITH CONTRAST TECHNIQUE: Multidetector CT imaging of the abdomen and pelvis was performed using the standard protocol following bolus administration of intravenous contrast. RADIATION DOSE REDUCTION: This exam was performed according  to the departmental dose-optimization program which includes automated exposure control, adjustment of the mA and/or kV according to patient size and/or use of iterative reconstruction technique. CONTRAST:  OMNIPAQUE IOHEXOL 300 MG/ML  SOLN COMPARISON:  June 04, 2023 FINDINGS: Lower chest: No acute abnormality. Hepatobiliary: No  focal liver abnormality is seen. No gallstones, gallbladder wall thickening, or biliary dilatation. Pancreas: Unremarkable. No pancreatic ductal dilatation or surrounding inflammatory changes. Spleen: Normal in size without focal abnormality. Adrenals/Urinary Tract: Adrenal glands are unremarkable. The left kidney is normal, without obstructing renal calculi, focal lesion, or hydronephrosis. Evidence of the patient's recent partial right nephrectomy is seen with a 5.1 cm x 4.3 cm x 6.4 cm area of heterogeneous low attenuation (approximately 55.21 Hounsfield units) noted along the posterior and lateral aspects of the mid and upper right kidney. This area contains numerous small foci of air and connects with a mild-to-moderate amount of perinephric fluid seen extending from the mid right kidney to the lower pole of the right kidney (approximately 44.40 Hounsfield units). A mild amount of posterior paranephric fluid is also present (approximately 60.07 Hounsfield units). Moderate severity, predominantly anterior urinary bladder wall thickening is seen. Stomach/Bowel: Stomach is within normal limits. Appendix appears normal. No evidence of bowel wall thickening, distention, or inflammatory changes. Numerous noninflamed diverticula are seen throughout the ascending colon Vascular/Lymphatic: Aortic atherosclerosis. No enlarged abdominal or pelvic lymph nodes. Reproductive: The prostate gland is mildly enlarged. Other: A mild amount of para muscular air is seen within the subcutaneous fat of the anterior abdominal wall on the right. A mild to moderate amount of perihepatic (approximately 71.25 Hounsfield units) and perisplenic fluid (approximately 51.95 Hounsfield units) is seen. A moderate amount of pelvic free fluid is also noted (approximately 37.52 Hounsfield units). Musculoskeletal: Multilevel degenerative changes are seen throughout the lumbar spine. IMPRESSION: 1. Evidence of the patient's recent partial right  nephrectomy with a 5.1 cm x 4.3 cm x 6.4 cm postoperative hematoma seen along the posterior and lateral aspects of the mid and upper right kidney. While there is no definite evidence to suggest active bleeding, and associated developing abscess cannot be excluded. 2. Moderate severity, predominantly anterior urinary bladder wall thickening which may represent sequelae associated with cystitis. Correlation with urinalysis is recommended. 3. Mild to moderate amount of perihepatic and perisplenic fluid, as well as a moderate amount of pelvic free fluid. A hemorrhagic component cannot be excluded. 4. Colonic diverticulosis. 5. Aortic atherosclerosis. Aortic Atherosclerosis (ICD10-I70.0). Electronically Signed   By: Aram Candela M.D.   On: 11/02/2023 01:33    ------  Elmon Kirschner, NP Pager: [phone]   Please contact the urology consult pager with any further questions/concerns.

## 2023-11-02 NOTE — ED Notes (Signed)
Interpreter and doctor are in the room.

## 2023-11-03 ENCOUNTER — Ambulatory Visit: Payer: Medicare PPO | Admitting: Adult Health

## 2023-11-03 DIAGNOSIS — T148XXA Other injury of unspecified body region, initial encounter: Secondary | ICD-10-CM | POA: Diagnosis not present

## 2023-11-03 LAB — HEMOGLOBIN AND HEMATOCRIT, BLOOD
HCT: 24.1 % — ABNORMAL LOW (ref 39.0–52.0)
HCT: 24.1 % — ABNORMAL LOW (ref 39.0–52.0)
HCT: 26.2 % — ABNORMAL LOW (ref 39.0–52.0)
HCT: 26.4 % — ABNORMAL LOW (ref 39.0–52.0)
Hemoglobin: 7.5 g/dL — ABNORMAL LOW (ref 13.0–17.0)
Hemoglobin: 7.5 g/dL — ABNORMAL LOW (ref 13.0–17.0)
Hemoglobin: 8 g/dL — ABNORMAL LOW (ref 13.0–17.0)
Hemoglobin: 8.3 g/dL — ABNORMAL LOW (ref 13.0–17.0)

## 2023-11-03 LAB — GLUCOSE, CAPILLARY
Glucose-Capillary: 143 mg/dL — ABNORMAL HIGH (ref 70–99)
Glucose-Capillary: 188 mg/dL — ABNORMAL HIGH (ref 70–99)
Glucose-Capillary: 157 mg/dL — ABNORMAL HIGH (ref 70–99)
Glucose-Capillary: 176 mg/dL — ABNORMAL HIGH (ref 70–99)

## 2023-11-03 LAB — TRANSFERRIN: Transferrin: 230 mg/dL (ref 180–329)

## 2023-11-03 LAB — IRON AND TIBC
Iron: 52 ug/dL (ref 45–182)
Saturation Ratios: 16 % — ABNORMAL LOW (ref 17.9–39.5)
TIBC: 322 ug/dL (ref 250–450)
UIBC: 270 ug/dL

## 2023-11-03 MED ORDER — DOCUSATE SODIUM 100 MG PO CAPS
100.0000 mg | ORAL_CAPSULE | Freq: Two times a day (BID) | ORAL | Status: DC
  Administered 2023-11-03 – 2023-11-04 (×3): 100 mg via ORAL
  Filled 2023-11-03 (×3): qty 1

## 2023-11-03 MED ORDER — SODIUM CHLORIDE 0.9 % IV SOLN
125.0000 mg | Freq: Every day | INTRAVENOUS | Status: AC
  Administered 2023-11-03 – 2023-11-04 (×2): 125 mg via INTRAVENOUS
  Filled 2023-11-03 (×2): qty 10

## 2023-11-03 MED ORDER — HYDROMORPHONE HCL 1 MG/ML IJ SOLN: 0.5000 mg | INTRAMUSCULAR | Status: DC | PRN

## 2023-11-03 MED ORDER — PANTOPRAZOLE SODIUM 40 MG PO TBEC
40.0000 mg | DELAYED_RELEASE_TABLET | Freq: Every day | ORAL | Status: DC
  Administered 2023-11-03 – 2023-11-04 (×2): 40 mg via ORAL
  Filled 2023-11-03 (×2): qty 1

## 2023-11-03 MED ORDER — OXYCODONE HCL 5 MG PO TABS
5.0000 mg | ORAL_TABLET | ORAL | Status: DC | PRN
  Administered 2023-11-03 – 2023-11-04 (×2): 5 mg via ORAL
  Filled 2023-11-03 (×2): qty 1

## 2023-11-03 NOTE — Progress Notes (Signed)
Subjective: Pt is stable overnight with outward or lab evidence of ongoing bleeding. I me with the pt, his spouse, Dr. [place], a friend who joined [place] by phone, and an ASL interpreter this morning. All questions were answered to their satisfaction.  Objective: Vital signs in last 24 hours: Temp:  [97.8 F (36.6 C)-98.4 F (36.9 C)] 98.3 F (36.8 C) (02/12 0521) Pulse Rate:  [72-83] 76 (02/12 0521) Resp:  [12-19] 19 (02/12 0521) BP: (105-124)/(56-72) 118/61 (02/12 0521) SpO2:  [95 %-98 %] 96 % (02/12 0521)  Assessment/Plan:   Intake/Output from previous day: #s/p right partial nephrectomy #right perinephric hematoma  CT A/P revealed a 5 x 1 x 4 0.3 x 6.4 postoperative hematoma seen along the posterior lateral aspect of the mid and upper right kidney.   Q4 H&H stable overnight. Will space out to Q6, and then Q8 if stable.   Iron studies have been ordered and IV iron will be provided twice over 2 days if deficiency has been noted.  Dr. Liliane Shi has considered darbepoetin.  Painting broadly, this is contraindicated in cancer patients.  There is some differing opinion on their use in the context of renal cancers, as surgical resection is almost always curative.  His path report as clearly defined 0.5 cm margins and there is no concern of ongoing disease documented.  I conferred with nephrology who felt that his kidneys were almost certainly putting out maximal amounts of Epogen naturally and that there would be little benefit to be gained from additional supplementation.  I reviewed all of this with the patient, since he will need whatever help he can get if transfusion is not an option.  Shared decision was made to await iron studies, proceed with IV iron today and tomorrow if indicated, and consider darbepoetin as a final intervention if necessary.  Care team will meet with ASL translator again tomorrow at 8:00am.   Intake/Output this shift: No intake/output data  recorded.  Physical Exam:  General: Alert and oriented CV: No cyanosis Lungs: equal chest rise Abdomen: mild post operative flank bruising. Port sites C/D/I   Lab Results: Recent Labs    11/02/23 2049 11/03/23 0021 11/03/23 0525  HGB 7.4* 7.5* 7.5*  HCT 23.7* 24.1* 24.1*   BMET Recent Labs    11/01/23 1721 11/02/23 0525  NA 133* 134*  K 3.8 3.5  CL 98 101  CO2 23 25  GLUCOSE 167* 134*  BUN 20 16  CREATININE 1.36* 1.23  CALCIUM 8.9 8.5*     Studies/Results: CT ABDOMEN PELVIS W CONTRAST Result Date: 11/02/2023 CLINICAL DATA:  Partial right kidney removal on October 20, 2023, presenting with painful urination and lower abdominal pain. EXAM: CT ABDOMEN AND PELVIS WITH CONTRAST TECHNIQUE: Multidetector CT imaging of the abdomen and pelvis was performed using the standard protocol following bolus administration of intravenous contrast. RADIATION DOSE REDUCTION: This exam was performed according to the departmental dose-optimization program which includes automated exposure control, adjustment of the mA and/or kV according to patient size and/or use of iterative reconstruction technique. CONTRAST:  OMNIPAQUE IOHEXOL 300 MG/ML  SOLN COMPARISON:  June 04, 2023 FINDINGS: Lower chest: No acute abnormality. Hepatobiliary: No focal liver abnormality is seen. No gallstones, gallbladder wall thickening, or biliary dilatation. Pancreas: Unremarkable. No pancreatic ductal dilatation or surrounding inflammatory changes. Spleen: Normal in size without focal abnormality. Adrenals/Urinary Tract: Adrenal glands are unremarkable. The left kidney is normal, without obstructing renal calculi, focal lesion, or hydronephrosis. Evidence of the patient's  recent partial right nephrectomy is seen with a 5.1 cm x 4.3 cm x 6.4 cm area of heterogeneous low attenuation (approximately 55.21 Hounsfield units) noted along the posterior and lateral aspects of the mid and upper right kidney. This area  contains numerous small foci of air and connects with a mild-to-moderate amount of perinephric fluid seen extending from the mid right kidney to the lower pole of the right kidney (approximately 44.40 Hounsfield units). A mild amount of posterior paranephric fluid is also present (approximately 60.07 Hounsfield units). Moderate severity, predominantly anterior urinary bladder wall thickening is seen. Stomach/Bowel: Stomach is within normal limits. Appendix appears normal. No evidence of bowel wall thickening, distention, or inflammatory changes. Numerous noninflamed diverticula are seen throughout the ascending colon Vascular/Lymphatic: Aortic atherosclerosis. No enlarged abdominal or pelvic lymph nodes. Reproductive: The prostate gland is mildly enlarged. Other: A mild amount of para muscular air is seen within the subcutaneous fat of the anterior abdominal wall on the right. A mild to moderate amount of perihepatic (approximately 71.25 Hounsfield units) and perisplenic fluid (approximately 51.95 Hounsfield units) is seen. A moderate amount of pelvic free fluid is also noted (approximately 37.52 Hounsfield units). Musculoskeletal: Multilevel degenerative changes are seen throughout the lumbar spine. IMPRESSION: 1. Evidence of the patient's recent partial right nephrectomy with a 5.1 cm x 4.3 cm x 6.4 cm postoperative hematoma seen along the posterior and lateral aspects of the mid and upper right kidney. While there is no definite evidence to suggest active bleeding, and associated developing abscess cannot be excluded. 2. Moderate severity, predominantly anterior urinary bladder wall thickening which may represent sequelae associated with cystitis. Correlation with urinalysis is recommended. 3. Mild to moderate amount of perihepatic and perisplenic fluid, as well as a moderate amount of pelvic free fluid. A hemorrhagic component cannot be excluded. 4. Colonic diverticulosis. 5. Aortic atherosclerosis. Aortic  Atherosclerosis (ICD10-I70.0). Electronically Signed   By: Aram Candela M.D.   On: 11/02/2023 01:33      LOS: 1 day   Stephen Kirschner, NP [facility] Pager: [phone]  11/03/2023, 9:42 AM

## 2023-11-03 NOTE — Progress Notes (Signed)
PROGRESS NOTE    Stephen Chase  QVZ:[id number] DOB: [birth year] DOA: 11/01/2023 PCP: [name], FNP    Brief Narrative:   Stephen Chase is a 58 y.o. male Jehov[employer]'s Witness with past medical history significant for HTN, HLD, CKD stage IIIb, DM2, peripheral neuropathy, OSA on CPAP, obesity, sensorineural deafness, GERD, and recent diagnosis of clear-cell renal cell carcinoma s/p right partial nephrectomy on 10/20/2023 who presented to [facility] ED on 11/01/2023 with complaints of lower abdominal pain, painful urination, hematuria and generalized ill feeling.  Also reports poor appetite since surgery.  Also endorsed headache earlier but now resolved.  Denies shortness of breath, no wheezing, no cough, no chest pain, no diarrhea, no constipation, no melena or hematochezia.  In the ED, temperature 98.1 F, HR 81, RR 18, BP 113/66, SpO2 98% room air.  WBC 9.1, hemoglobin 8.0 (10.0 on 10/21/23), platelet count 383.  Sodium 133, potassium 3.8, chloride 98, CO2 23, glucose 167, BUN 20, Cram 1.36.  AST 27, ALT 28, total bilirubin 1.2.  Ammonia 40.  Lipase 44.  Urinalysis with moderate hemoglobin, greater than 500 glucose, otherwise unrevealing.  Blood cultures x 2 obtained.  CT abdomen/pelvis with contrast with evidence of recent partial right nephrectomy with a 5.1 cm x 4.3 cm x 6.4 cm postoperative hematoma along posterior/lateral aspects of the mid/upper right kidney, no evidence to suggest active bleeding, developing abscess cannot be excluded, moderate severity anterior urinary bladder wall thickening, moderate amount of perihepatic and perisplenic fluid, trace pelvic free fluid, colonic diverticulosis, aortic atherosclerosis. The patient received fentanyl 50 mcg IVP x 1, ceftriaxone 2 g IVPB, metronidazole 500 mg IVPB and 1000 mL of normal saline bolus.  Urology consulted.  TRH consulted for admission.  Assessment & Plan:   Acute postoperative blood loss anemia Right perinephric  hematoma Clear-cell carcinoma s/p right partial nephrectomy Patient presenting to ED with abdominal pain, hematuria, painful urination and generalized ill feeling.  Patient recently diagnosed with renal mass underwent right partial nephrectomy on 10/20/2023 by urology Dr. Liliane Shi.  Hemoglobin preop 14.5 on 10/14/2023; and dropped to 10.0 at time of discharge on 10/21/2023.  Hemoglobin down to 8.0 at time of admission.  CT abdomen/pelvis notable for right kidney postoperative hematoma measuring 5.1 x 4.3 x 6.4 cm, no evidence to suggest active bleeding. -- Urology following, appreciate assistance -- Unable to transfuse blood products due to Jehovah's Witness status -- Hgb 8.0>7.1>7.4>7.8>7.4>7.5>7.5 -- Given borderline iron stores, Ferrlecit IV daily x 2 (verified with patient/family okay for IV iron infusion) -- Continue to monitor H&H every 6 hours; significant decline will need to consider CT angiogram to look for target for consideration of embolization -- Discontinue ceftriaxone, no indication for active intra-abdominal infection at this time -- Continue monitor on telemetry -- Further per urology  Essential hypertension Home regimen includes carvedilol 3.125 mg p.o. twice daily. -- BP 118/61 this morning, will continue to hold home Antepar tensive regimen -- Continue monitor BP closely  Dyslipidemia -- Atorvastatin 40 mg p.o. nightly -- Zetia 10 mg p.o. daily -- Fenofibrate 106 mg p.o. daily -- Vascepa 1 g p.o. daily  CKD stage IIIb Baseline creatinine 1.5-1.6.  Creatinine on admission, 1.36, actually improved from his typical baseline. -- Cr 1.36>1.23 -- Avoid nephrotoxins, renal dose all medications  Type 2 diabetes mellitus Hemoglobin A1c 7.1 on 10/14/2023, well-controlled.  On Farxiga 10 mg p.o. daily outpatient. -- Hold Farxiga while inpatient -- Moderate SSI for coverage -- CV before every meal/at bedtime  Cirrhosis of the  liver -- Outpatient follow-up with  GI/hepatology  OSA -- Continue nocturnal CPAP  GERD -- continue PPI  Obesity, class I Body mass index is 31.16 kg/m.    DVT prophylaxis: SCDs Start: 11/02/23 0407    Code Status: Full Code Family Communication: Updated patient's family present at bedside  Disposition Plan:  Level of care: Telemetry Status is: Inpatient Remains inpatient appropriate because: IV iron infusion, needs close monitoring of hemoglobin    Consultants:  Urology  Procedures:  None  Antimicrobials:  Ceftriaxone 2/10 - 2/11 Metronidazole 2/10 - 2/10   Subjective: Patient seen examined bedside, lying in bed.  Family present.  Also friend watching via FaceTime.  Assisted with interpretation with sign language translator Loraine Leriche who is present at bedside.  Urology also present at bedside.  Discussed with patient that his hemoglobin is stable, 7.5 this morning.  Given his ethics regarding blood transfusion we will proceed with IV iron infusion given his borderline iron stores; in which he is okay with this plan.  Will continue to monitor hemoglobin but space out since it is currently stable.  Patient denies any further headache, no dizziness, no overall weakness this morning.  No other specific complaints, concerns or questions at this time.  Specifically denies headache, no visual changes, no chest pain, no shortness of breath, no dizziness, no abdominal pain.  No acute events overnight per nurse staff.  Objective: Vitals:   11/02/23 2028 11/02/23 2248 11/03/23 0017 11/03/23 0521  BP: 116/64  112/63 118/61  Pulse: 82  79 76  Resp: 18 19 18 19   Temp: 98.2 F (36.8 C)  97.8 F (36.6 C) 98.3 F (36.8 C)  TempSrc: Oral  Oral Oral  SpO2: 95%  98% 96%  Weight:      Height:        Intake/Output Summary (Last 24 hours) at 11/03/2023 1155 Last data filed at 11/02/2023 1800 Gross per 24 hour  Intake 100 ml  Output --  Net 100 ml   Filed Weights   11/01/23 1553  Weight: 95.7 kg     Examination:  Physical Exam: GEN: NAD, alert and oriented x 3, obese HEENT: NCAT, PERRL, EOMI, sclera clear, MMM PULM: CTAB w/o wheezes/crackles, normal respiratory effort, on room air with SpO2 96% at rest CV: RRR w/o M/G/R GI: abd soft, NTND, NABS, no R/G/M MSK: no peripheral edema, muscle strength globally intact 5/5 bilateral upper/lower extremities NEURO: CN II-XII intact, no focal deficits, sensation to light touch intact PSYCH: normal mood/affect Integumentary: Slight ecchymosis to right flank, port sites noted clean/dry/intact, otherwise no other concerning rashes/lesions/wounds noted on exposed skin surfaces     Data Reviewed: I have personally reviewed following labs and imaging studies  CBC: Recent Labs  Lab 11/01/23 1721 11/02/23 0525 11/02/23 1141 11/02/23 1806 11/02/23 2049 11/03/23 0021 11/03/23 0525  WBC 9.1  --   --   --   --   --   --   NEUTROABS 6.1  --   --   --   --   --   --   HGB 8.0*   < > 7.4* 7.8* 7.4* 7.5* 7.5*  HCT 25.1*   < > 23.3* 24.8* 23.7* 24.1* 24.1*  MCV 92.3  --   --   --   --   --   --   PLT 383  --   --   --   --   --   --    < > = values in this  interval not displayed.   Basic Metabolic Panel: Recent Labs  Lab 11/01/23 1721 11/02/23 0525  NA 133* 134*  K 3.8 3.5  CL 98 101  CO2 23 25  GLUCOSE 167* 134*  BUN 20 16  CREATININE 1.36* 1.23  CALCIUM 8.9 8.5*   GFR: Estimated Creatinine Clearance: 76.5 mL/min (by C-G formula based on SCr of 1.23 mg/dL). Liver Function Tests: Recent Labs  Lab 11/01/23 1721  AST 27  ALT 28  ALKPHOS 68  BILITOT 1.2  PROT 7.6  ALBUMIN 3.9   Recent Labs  Lab 11/01/23 1721  LIPASE 44   Recent Labs  Lab 11/01/23 1721  AMMONIA 40*   Coagulation Profile: Recent Labs  Lab 11/02/23 0525  INR 1.4*   Cardiac Enzymes: No results for input(s): "CKTOTAL", "CKMB", "CKMBINDEX", "TROPONINI" in the last 168 hours. BNP (last 3 results) No results for input(s): "PROBNP" in the last 8760  hours. HbA1C: No results for input(s): "HGBA1C" in the last 72 hours. CBG: Recent Labs  Lab 11/02/23 0800 11/02/23 1658 11/02/23 2025 11/03/23 0751 11/03/23 1144  GLUCAP 124* 155* 180* 143* 188*   Lipid Profile: No results for input(s): "CHOL", "HDL", "LDLCALC", "TRIG", "CHOLHDL", "LDLDIRECT" in the last 72 hours. Thyroid Function Tests: No results for input(s): "TSH", "T4TOTAL", "FREET4", "T3FREE", "THYROIDAB" in the last 72 hours. Anemia Panel: Recent Labs    11/03/23 0525  TIBC 322  IRON 52   Sepsis Labs: No results for input(s): "PROCALCITON", "LATICACIDVEN" in the last 168 hours.  Recent Results (from the past 240 hours)  Blood culture (routine x 2)     Status: None (Preliminary result)   Collection Time: 11/02/23  4:50 AM   Specimen: BLOOD LEFT ARM  Result Value Ref Range Status   Specimen Description   Final    BLOOD LEFT ARM Performed at [facility], [address] [place], [place] [address]    Special Requests   Final    BOTTLES DRAWN AEROBIC AND ANAEROBIC Blood Culture adequate volume Performed at [facility], [address], [place], [place] [address]    Culture   Final    NO GROWTH 1 DAY Performed at [facility], [address] [place], [place] [address]    Report Status PENDING  Incomplete  Blood culture (routine x 2)     Status: None (Preliminary result)   Collection Time: 11/02/23  5:00 AM   Specimen: BLOOD RIGHT ARM  Result Value Ref Range Status   Specimen Description   Final    BLOOD RIGHT ARM Performed at [facility], [address] [place], [place] [address]    Special Requests   Final    BOTTLES DRAWN AEROBIC AND ANAEROBIC Blood Culture adequate volume Performed at [facility], [address], [place], [place] [address]    Culture   Final    NO GROWTH 1 DAY Performed at [facility], [address], [place], [place] [address]    Report Status PENDING  Incomplete          Radiology Studies: CT ABDOMEN PELVIS W CONTRAST Result Date: 11/02/2023 CLINICAL DATA:  Partial right kidney removal on October 20, 2023, presenting with painful urination and lower abdominal pain. EXAM: CT ABDOMEN AND PELVIS WITH CONTRAST TECHNIQUE: Multidetector CT imaging of the abdomen and pelvis was performed using the standard protocol following bolus administration of intravenous contrast. RADIATION DOSE REDUCTION: This exam was performed according to the departmental dose-optimization  program which includes automated exposure control, adjustment of the mA and/or kV according to patient size and/or use of iterative reconstruction technique. CONTRAST:  OMNIPAQUE IOHEXOL 300 MG/ML  SOLN COMPARISON:  June 04, 2023 FINDINGS: Lower chest: No acute abnormality. Hepatobiliary: No focal liver abnormality is seen. No gallstones, gallbladder wall thickening, or biliary dilatation. Pancreas: Unremarkable. No pancreatic ductal dilatation or surrounding inflammatory changes. Spleen: Normal in size without focal abnormality. Adrenals/Urinary Tract: Adrenal glands are unremarkable. The left kidney is normal, without obstructing renal calculi, focal lesion, or hydronephrosis. Evidence of the patient's recent partial right nephrectomy is seen with a 5.1 cm x 4.3 cm x 6.4 cm area of heterogeneous low attenuation (approximately 55.21 Hounsfield units) noted along the posterior and lateral aspects of the mid and upper right kidney. This area contains numerous small foci of air and connects with a mild-to-moderate amount of perinephric fluid seen extending from the mid right kidney to the lower pole of the right kidney (approximately 44.40 Hounsfield units). A mild amount of posterior paranephric fluid is also present (approximately 60.07 Hounsfield units). Moderate severity, predominantly anterior urinary bladder wall thickening is seen. Stomach/Bowel: Stomach is within normal limits. Appendix appears  normal. No evidence of bowel wall thickening, distention, or inflammatory changes. Numerous noninflamed diverticula are seen throughout the ascending colon Vascular/Lymphatic: Aortic atherosclerosis. No enlarged abdominal or pelvic lymph nodes. Reproductive: The prostate gland is mildly enlarged. Other: A mild amount of para muscular air is seen within the subcutaneous fat of the anterior abdominal wall on the right. A mild to moderate amount of perihepatic (approximately 71.25 Hounsfield units) and perisplenic fluid (approximately 51.95 Hounsfield units) is seen. A moderate amount of pelvic free fluid is also noted (approximately 37.52 Hounsfield units). Musculoskeletal: Multilevel degenerative changes are seen throughout the lumbar spine. IMPRESSION: 1. Evidence of the patient's recent partial right nephrectomy with a 5.1 cm x 4.3 cm x 6.4 cm postoperative hematoma seen along the posterior and lateral aspects of the mid and upper right kidney. While there is no definite evidence to suggest active bleeding, and associated developing abscess cannot be excluded. 2. Moderate severity, predominantly anterior urinary bladder wall thickening which may represent sequelae associated with cystitis. Correlation with urinalysis is recommended. 3. Mild to moderate amount of perihepatic and perisplenic fluid, as well as a moderate amount of pelvic free fluid. A hemorrhagic component cannot be excluded. 4. Colonic diverticulosis. 5. Aortic atherosclerosis. Aortic Atherosclerosis (ICD10-I70.0). Electronically Signed   By: Aram Candela M.D.   On: 11/02/2023 01:33        Scheduled Meds:  atorvastatin  40 mg Oral QHS   ezetimibe  10 mg Oral Daily   fenofibrate  160 mg Oral Daily   icosapent Ethyl  1 g Oral Q1200   insulin aspart  0-15 Units Subcutaneous TID WC   insulin aspart  0-5 Units Subcutaneous QHS   modafinil  200 mg Oral Daily   pantoprazole  40 mg Oral Daily   tamsulosin  0.4 mg Oral Daily    Continuous Infusions:  ferric gluconate (FERRLECIT) IVPB       LOS: 1 day    Time spent: 58 minutes spent on chart review, discussion with nursing staff, consultants, updating family and interview/physical exam; more than 50% of that time was spent in counseling and/or coordination of care.    Alvira Philips [place], DO [facility] Available via Epic secure chat 7am-7pm After these hours, please refer to coverage provider listed on [email or web address] 11/03/2023, 11:55 AM

## 2023-11-03 NOTE — TOC Initial Note (Signed)
Transition of Care [facility]) - Initial/Assessment Note    Patient Details  Name: Stephen Chase MRN: [id number] Date of Birth: 1965-10-26  Transition of Care [facility]) CM/SW Contact:    Lanier Clam, RN Phone Number: 11/03/2023, 1:09 PM  Clinical Narrative: d/c plan home.                  Expected Discharge Plan: Home/Self Care Barriers to Discharge: Continued Medical Work up   Patient Goals and CMS Choice Patient states their goals for this hospitalization and ongoing recovery are:: Home CMS Medicare.gov Compare Post Acute Care list provided to:: Patient Choice offered to / list presented to : Patient [place] ownership interest in [facility].provided to:: Patient    Expected Discharge Plan and Services   Discharge Planning Services: CM Consult Post Acute Care Choice: Resumption of Svcs/PTA Provider Living arrangements for the past 2 months: Single Family Home                                      Prior Living Arrangements/Services Living arrangements for the past 2 months: Single Family Home Lives with:: Spouse   Do you feel safe going back to the place where you live?: Yes               Activities of Daily Living   ADL Screening (condition at time of admission) Independently performs ADLs?: Yes (appropriate for developmental age) Is the patient deaf or have difficulty hearing?: Yes Does the patient have difficulty seeing, even when wearing glasses/contacts?: No Does the patient have difficulty concentrating, remembering, or making decisions?: No  Permission Sought/Granted Permission sought to share information with : Case Manager Permission granted to share information with : Yes, Verbal Permission Granted  Share Information with NAME: Case manager           Emotional Assessment              Admission diagnosis:  Hematoma [T14.8XXA] Post-operative pain [G89.18] Pelvic fluid collection [R18.8] Hematoma of right kidney, initial  encounter [S37.011A] Anemia, unspecified type [D64.9] Patient Active Problem List   Diagnosis Date Noted   Status post nephrectomy 11/02/2023   Hematoma 11/02/2023   T2DM (type 2 diabetes mellitus) (HCC) 11/02/2023   Diabetic peripheral neuropathy (HCC) 11/02/2023   ABLA (acute blood loss anemia) 11/02/2023   Renal mass 10/20/2023   Type 2 diabetes mellitus with hyperlipidemia (HCC) 09/21/2023   Hypertriglyceridemia 09/21/2023   Influenza vaccination declined 09/21/2023   COVID-19 vaccination declined 09/21/2023   Class 1 obesity due to excess calories with body mass index (BMI) of 31.0 to 31.9 in adult 09/21/2023   Severe obstructive sleep apnea-hypopnea syndrome 08/01/2023   Lumbar spondylosis 05/07/2023   Class 1 obesity due to excess calories with serious comorbidity and body mass index (BMI) of 34.0 to 34.9 in adult 05/06/2023   Fall 05/06/2023   Left hip pain 05/06/2023   Lumbar radiculopathy 02/18/2023   Esophageal dysphagia 11/11/2022   Mixed hyperlipidemia 11/02/2022   Essential hypertension 11/02/2022   Other dysphagia 07/15/2022   Other cirrhosis of liver (HCC) 07/15/2022   Sore throat 07/15/2022   Lumbar pain 05/09/2020   Hypersomnia, persistent 05/09/2018   Type 2 diabetes mellitus with diabetic neuropathy, without long-term current use of insulin (HCC) 05/09/2018   Uncontrolled type 2 diabetes mellitus with hyperglycemia (HCC) 05/09/2018   OSA on CPAP 05/09/2018   Esophageal varices without bleeding (HCC)  05/09/2018   Bilateral deafness 05/09/2018   PCP:  [name], FNP Pharmacy:   [facility] [phone] - [place], [place] - [address] AT [facility] [address] [facility] [address] [place] [address] Phone: [phone] Fax: [phone]  [facility] - [place], [place] - [address] [place] [address] Phone: [phone] Fax: [phone]     Social Drivers of Health  (SDOH) Social History: SDOH Screenings   Food Insecurity: No Food Insecurity (11/02/2023)  Housing: Low Risk  (11/02/2023)  Transportation Needs: No Transportation Needs (11/02/2023)  Utilities: Not At Risk (11/02/2023)  Alcohol Screen: Low Risk  (05/06/2023)  Depression (PHQ2-9): Low Risk  (05/06/2023)  Financial Resource Strain: Low Risk  (05/06/2023)  Physical Activity: Insufficiently Active (05/06/2023)  Social Connections: Moderately Integrated (11/02/2023)  Stress: No Stress Concern Present (05/06/2023)  Tobacco Use: Medium Risk (11/01/2023)  Health Literacy: Adequate Health Literacy (05/06/2023)   SDOH Interventions:     Readmission Risk Interventions     No data to display

## 2023-11-03 NOTE — Progress Notes (Signed)
   11/03/23 2317  BiPAP/CPAP/SIPAP  BiPAP/CPAP/SIPAP Pt Type Adult (pt comfortable / prefers self-placement, RN aware)  BiPAP/CPAP/SIPAP DREAMSTATIOND  Mask Type Full face mask (MASK FROM HOME)  FiO2 (%) 21 %  Patient Home Equipment No (mask from home)  Auto Titrate Yes (AUTOMODE, MIN4-20CM H2O)  CPAP/SIPAP surface wiped down Yes

## 2023-11-04 ENCOUNTER — Encounter (HOSPITAL_COMMUNITY): Payer: Self-pay | Admitting: Family Medicine

## 2023-11-04 DIAGNOSIS — T148XXA Other injury of unspecified body region, initial encounter: Secondary | ICD-10-CM | POA: Diagnosis not present

## 2023-11-04 LAB — HEMOGLOBIN AND HEMATOCRIT, BLOOD
HCT: 25.4 % — ABNORMAL LOW (ref 39.0–52.0)
HCT: 25.7 % — ABNORMAL LOW (ref 39.0–52.0)
HCT: 25.8 % — ABNORMAL LOW (ref 39.0–52.0)
Hemoglobin: 7.7 g/dL — ABNORMAL LOW (ref 13.0–17.0)
Hemoglobin: 7.8 g/dL — ABNORMAL LOW (ref 13.0–17.0)
Hemoglobin: 7.9 g/dL — ABNORMAL LOW (ref 13.0–17.0)

## 2023-11-04 LAB — GLUCOSE, CAPILLARY
Glucose-Capillary: 130 mg/dL — ABNORMAL HIGH (ref 70–99)
Glucose-Capillary: 228 mg/dL — ABNORMAL HIGH (ref 70–99)

## 2023-11-04 MED ORDER — FERROUS SULFATE 325 (65 FE) MG PO TBEC: 325.0000 mg | DELAYED_RELEASE_TABLET | Freq: Every day | ORAL | 0 refills | Status: DC

## 2023-11-04 NOTE — Discharge Instructions (Signed)
Hold home blood pressure medicine carvedilol until follow-up with primary care physician for further guidance regarding restarting.  Hold home aspirin as well until follow-up with urology and primary care physician for plan to restart outpatient.

## 2023-11-04 NOTE — Consult Note (Signed)
Value-Based Care [facility] Liaison Consult Note   11/04/2023  Stephen Chase 31-Oct-1965 [id number]  Insurance: [employer]   Primary Care Provider: [name], FNP with [facility]  this provider is listed for the transition of care follow up appointments  and VBCI [facility] calls   [facility] Liaison screened the patient remotely at [facility].  Telephone call placed to patient with operator assisted call to room however, not successful for attempt to reach wife however, called phone number provided for operator services as well and able to leave a HIPAA appropriate voicemail message to explain potential follow up in community for post hospital needs. Requested a return call.    The patient was screened for 30 day readmission hospitalization with noted medium risk score for unplanned readmission risk 2 hospital admissions in 6 months.  The patient was assessed for potential Community Care Coordination service needs for post hospital transition for care coordination. Review of patient's electronic medical record reveals patient is for home noted..   Plan: [facility] Liaison will continue to follow progress and disposition to asess for post hospital community care coordination/management needs.  Referral request for community care coordination: Patient is anticipated to be outreached by VBCI TOC team associated with PCP, if patient does not return call request.   VBCI Community Care, Population Health does not replace or interfere with any arrangements made by the Inpatient Transition of Care team.   For questions contact:   Charlesetta Shanks, RN, BSN, CCM [place]  [facility], [facility] Liaison Direct Dial: [phone] or secure chat Email: [email or web address]         .

## 2023-11-04 NOTE — Progress Notes (Signed)
     Subjective: Pt is stable overnight with outward or lab evidence of ongoing bleeding. I again met with the pt, his spouse, Dr. [place], a friend who joined [place] by phone, and an ASL interpreter this morning. The pt was eager to return home, but his wife was very nervous. We reviewed the case and plan, explaining that his recovering hemoglobin would take time. All questions were answered to their satisfaction.   Objective: Vital signs in last 24 hours: Temp:  [97.6 F (36.4 C)-98.7 F (37.1 C)] 97.6 F (36.4 C) (02/13 0622) Pulse Rate:  [69-102] 69 (02/13 0622) Resp:  [18] 18 (02/13 0622) BP: (112-121)/(61-71) 121/71 (02/13 0622) SpO2:  [96 %-99 %] 98 % (02/13 0622) FiO2 (%):  [21 %] 21 % (02/12 2317)  Assessment/Plan:  #s/p right partial nephrectomy #right perinephric hematoma  CT A/P revealed a 5 x 1 x 4.3 x 6.4 postoperative hematoma seen along the posterior lateral aspect of the mid and upper right kidney.   H&H collected 10x, pt remains stable with stable hemodynamics. No sign of active bleed  Iron studies completed with 2 doses of IV Iron.   Normalized renal function. No LUTS, including hematuria  Pt to follow up in clinic in around one week.   Shared decision made to discharge home following today's dose of IV iron. Pt understands to pair this with stool softeners as needed. We do no want any straining with bowel movements or lifting over 10 lbs until re-evaluated in clinic. Pt will check blood pressure and pulse in the morning, afternoon, and if concerned- daily until follow up. She will contact to office if systolic drops below 100 or heart rate is persistently above 100 bpm. PCP should be notified if B/P is persistently 140/90 or above. These directions are in addition to post operative education previously provided. Also clarify home antihypertensives. Pt's spouse was asking about meds that were no on the list.   Case and plan discussed with Dr. Liliane Shi.     Intake/Output this shift: No intake/output data recorded.  Physical Exam:  General: Alert and oriented CV: No cyanosis Lungs: equal chest rise Abdomen: mild post operative flank bruising. Port sites C/D/I   Lab Results: Recent Labs    11/03/23 1647 11/03/23 2357 11/04/23 0516  HGB 8.0* 7.7* 7.8*  HCT 26.2* 25.4* 25.8*   BMET Recent Labs    11/01/23 1721 11/02/23 0525  NA 133* 134*  K 3.8 3.5  CL 98 101  CO2 23 25  GLUCOSE 167* 134*  BUN 20 16  CREATININE 1.36* 1.23  CALCIUM 8.9 8.5*     Studies/Results: No results found.     LOS: 2 days   Elmon Kirschner, NP [facility] Pager: [phone]  11/04/2023, 9:45 AM

## 2023-11-04 NOTE — Progress Notes (Signed)
Pt expressed some concern about discharging today and requested to stay another day. Request made known to MD. MD ordered repeat H&H to ensure it is trending in the right direction. Discharge paperwork reviewed using an interpretor. Hgb stable at 7.9 and trending upward. Pt and family notified.

## 2023-11-04 NOTE — Discharge Summary (Signed)
Physician Discharge Summary  Stephen Chase ZOX:[id number] DOB: 08/22/1966 DOA: 11/01/2023  PCP: [name], FNP  Admit date: 11/01/2023 Discharge date: 11/04/2023  Admitted From: Home Disposition: Home  Recommendations for Outpatient Follow-up:  Follow up with PCP in 1-2 weeks Follow-up with urology, Dr. Liliane Shi in 1 week as scheduled Recommend repeat CBC 1 week to recheck hemoglobin level Started on ferrous sulfate to assist with acute postoperative blood loss anemia, refusing transfusions given he is a Jehovah's Witness Holding home carvedilol until follow-up with PCP for further guidance on restarting given borderline blood pressures during hospitalization from acute blood loss as above  Home Health: No Equipment/Devices: None  Discharge Condition: Stable CODE STATUS: Full code Diet recommendation: Heart healthy diet  History of present illness:  Stephen Chase is a 58 y.o. male Jehovah's Witness with past medical history significant for HTN, HLD, CKD stage IIIb, DM2, peripheral neuropathy, OSA on CPAP, obesity, sensorineural deafness, GERD, and recent diagnosis of clear-cell renal cell carcinoma s/p right partial nephrectomy on 10/20/2023 who presented to [facility] ED on 11/01/2023 with complaints of lower abdominal pain, painful urination, hematuria and generalized ill feeling.  Also reports poor appetite since surgery.  Also endorsed headache earlier but now resolved.  Denies shortness of breath, no wheezing, no cough, no chest pain, no diarrhea, no constipation, no melena or hematochezia.   In the ED, temperature 98.1 F, HR 81, RR 18, BP 113/66, SpO2 98% room air.  WBC 9.1, hemoglobin 8.0 (10.0 on 10/21/23), platelet count 383.  Sodium 133, potassium 3.8, chloride 98, CO2 23, glucose 167, BUN 20, Cram 1.36.  AST 27, ALT 28, total bilirubin 1.2.  Ammonia 40.  Lipase 44.  Urinalysis with moderate hemoglobin, greater than 500 glucose, otherwise unrevealing.  Blood cultures x 2  obtained.  CT abdomen/pelvis with contrast with evidence of recent partial right nephrectomy with a 5.1 cm x 4.3 cm x 6.4 cm postoperative hematoma along posterior/lateral aspects of the mid/upper right kidney, no evidence to suggest active bleeding, developing abscess cannot be excluded, moderate severity anterior urinary bladder wall thickening, moderate amount of perihepatic and perisplenic fluid, trace pelvic free fluid, colonic diverticulosis, aortic atherosclerosis. The patient received fentanyl 50 mcg IVP x 1, ceftriaxone 2 g IVPB, metronidazole 500 mg IVPB and 1000 mL of normal saline bolus.  Urology consulted.  TRH consulted for admission.  Hospital course:  Acute postoperative blood loss anemia Right perinephric hematoma Clear-cell carcinoma s/p right partial nephrectomy Patient presenting to ED with abdominal pain, hematuria, painful urination and generalized ill feeling.  Patient recently diagnosed with renal mass underwent right partial nephrectomy on 10/20/2023 by urology Dr. Liliane Shi.  Hemoglobin preop 14.5 on 10/14/2023; and dropped to 10.0 at time of discharge on 10/21/2023.  Hemoglobin down to 8.0 at time of admission.  CT abdomen/pelvis notable for right kidney postoperative hematoma measuring 5.1 x 4.3 x 6.4 cm, no evidence to suggest active bleeding.  Unable to transfuse blood products due to Jehovah's Witness status.  Urology was consulted and followed during hospital course.  Patient was given 2 doses of IV iron, hemoglobin remained stable, 7.8 at time of discharge.  Will continue ferrous sulfate 325 mg p.o. daily on discharge.  Hold home aspirin.  Patient will follow-up with urology in 1 week.  Recommend repeat CBC at visit.   Essential hypertension Home regimen includes carvedilol 3.125 mg p.o. twice daily.  Holding home carvedilol and discharge given blood pressures well-controlled off of this likely due to his acute blood loss  anemia as above.  Recommend outpatient follow-up with PCP  for further guidance on restarting.  Also holding home aspirin.   Dyslipidemia Continue atorvastatin 40 mg p.o. nightly, Zetia 10 mg p.o. daily, Fenofibrate 160 mg p.o. daily, Vascepa 1 g p.o. daily   CKD stage IIIb Baseline creatinine 1.5-1.6.  Creatinine on admission, 1.36, actually improved from his typical baseline.   Type 2 diabetes mellitus Hemoglobin A1c 7.1 on 10/14/2023, well-controlled.  On Farxiga 10 mg p.o. daily outpatient.   Cirrhosis of the liver Outpatient follow-up with GI/hepatology   OSA Continue nocturnal CPAP   GERD continue PPI   Obesity, class I Body mass index is 31.16 kg/m.     Discharge Diagnoses:  Principal Problem:   Hematoma Active Problems:   OSA on CPAP   Bilateral deafness   Other cirrhosis of liver (HCC)   Essential hypertension   Hypertriglyceridemia   Class 1 obesity due to excess calories with body mass index (BMI) of 31.0 to 31.9 in adult   Status post nephrectomy   T2DM (type 2 diabetes mellitus) (HCC)   Lumbar spondylosis   Lumbar radiculopathy   Diabetic peripheral neuropathy (HCC)   ABLA (acute blood loss anemia)    Discharge Instructions  Discharge Instructions     Call MD for:  difficulty breathing, headache or visual disturbances   Complete by: As directed    Call MD for:  extreme fatigue   Complete by: As directed    Call MD for:  persistant dizziness or light-headedness   Complete by: As directed    Call MD for:  persistant nausea and vomiting   Complete by: As directed    Call MD for:  severe uncontrolled pain   Complete by: As directed    Call MD for:  temperature >100.4   Complete by: As directed    Diet - low sodium heart healthy   Complete by: As directed    Increase activity slowly   Complete by: As directed       Allergies as of 11/04/2023       Reactions   Other    Blood Refusal         Medication List     PAUSE taking these medications    carvedilol 3.125 MG tablet Wait to take this  until your doctor or other care provider tells you to start again. Commonly known as: COREG Take 1 tablet (3.125 mg total) by mouth 2 (two) times daily.       TAKE these medications    Accu-Chek Guide test strip Generic drug: glucose blood CHECK BLOOD SUGAR 3 TIMES A DAY BEFORE BREAKFAST, LUNCH, AND DINNER   glucose blood test strip Use as instructed to test blood sugars 3 times a day dx code e11.65   Accu-Chek Guide w/Device Kit Use to check blood sugars three times daily E11.9   Accu-Chek Softclix Lancets lancets Use as instructed to test blood sugars 3 times a day dx code e11.65   atorvastatin 40 MG tablet Commonly known as: LIPITOR TAKE 1 TABLET(40 MG) BY MOUTH AT BEDTIME   docusate sodium 100 MG capsule Commonly known as: COLACE Take 1 capsule (100 mg total) by mouth 2 (two) times daily.   ezetimibe 10 MG tablet Commonly known as: ZETIA Take 1 tablet (10 mg total) by mouth daily.   FARXIGA PO Take 10 mg by mouth daily.   fenofibrate 145 MG tablet Commonly known as: TRICOR TAKE 1 TABLET(145 MG) BY MOUTH DAILY  ferrous sulfate 325 (65 FE) MG EC tablet Take 1 tablet (325 mg total) by mouth daily with breakfast. Start taking on: November 05, 2023   HYDROcodone-acetaminophen 5-325 MG tablet Commonly known as: Norco Take 1-2 tablets by mouth every 6 (six) hours as needed for moderate pain (pain score 4-6) or severe pain (pain score 7-10).   icosapent Ethyl 1 g capsule Commonly known as: Vascepa One bid for elevated triglycerides   modafinil 200 MG tablet Commonly known as: PROVIGIL Take 1 tablet (200 mg total) by mouth daily.   omeprazole 10 MG capsule Commonly known as: PRILOSEC TAKE 1 CAPSULE(10 MG) BY MOUTH DAILY   tamsulosin 0.4 MG Caps capsule Commonly known as: FLOMAX TAKE 1 CAPSULE BY MOUTH EVERY DAY 30 MINUTES AFTER THE SAME MEAL   UNABLE TO FIND CPAP for OSA (6cm-19cm)        Follow-up Information     [name], FNP. Schedule an  appointment as soon as possible for a visit in 1 week(s).   Specialty: General Practice Contact information: [address] [place] [address] [phone]         [name], MD Follow up.   Specialty: Urology Contact information: [address] [place] [address] [place] [address] [phone]                Allergies  Allergen Reactions   Other     Blood Refusal     Consultations: Urology   Procedures/Studies: CT ABDOMEN PELVIS W CONTRAST Result Date: 11/02/2023 CLINICAL DATA:  Partial right kidney removal on October 20, 2023, presenting with painful urination and lower abdominal pain. EXAM: CT ABDOMEN AND PELVIS WITH CONTRAST TECHNIQUE: Multidetector CT imaging of the abdomen and pelvis was performed using the standard protocol following bolus administration of intravenous contrast. RADIATION DOSE REDUCTION: This exam was performed according to the departmental dose-optimization program which includes automated exposure control, adjustment of the mA and/or kV according to patient size and/or use of iterative reconstruction technique. CONTRAST:  OMNIPAQUE IOHEXOL 300 MG/ML  SOLN COMPARISON:  June 04, 2023 FINDINGS: Lower chest: No acute abnormality. Hepatobiliary: No focal liver abnormality is seen. No gallstones, gallbladder wall thickening, or biliary dilatation. Pancreas: Unremarkable. No pancreatic ductal dilatation or surrounding inflammatory changes. Spleen: Normal in size without focal abnormality. Adrenals/Urinary Tract: Adrenal glands are unremarkable. The left kidney is normal, without obstructing renal calculi, focal lesion, or hydronephrosis. Evidence of the patient's recent partial right nephrectomy is seen with a 5.1 cm x 4.3 cm x 6.4 cm area of heterogeneous low attenuation (approximately 55.21 Hounsfield units) noted along the posterior and lateral aspects of the mid and upper right kidney. This area contains numerous small  foci of air and connects with a mild-to-moderate amount of perinephric fluid seen extending from the mid right kidney to the lower pole of the right kidney (approximately 44.40 Hounsfield units). A mild amount of posterior paranephric fluid is also present (approximately 60.07 Hounsfield units). Moderate severity, predominantly anterior urinary bladder wall thickening is seen. Stomach/Bowel: Stomach is within normal limits. Appendix appears normal. No evidence of bowel wall thickening, distention, or inflammatory changes. Numerous noninflamed diverticula are seen throughout the ascending colon Vascular/Lymphatic: Aortic atherosclerosis. No enlarged abdominal or pelvic lymph nodes. Reproductive: The prostate gland is mildly enlarged. Other: A mild amount of para muscular air is seen within the subcutaneous fat of the anterior abdominal wall on the right. A mild to moderate amount of perihepatic (approximately 71.25 Hounsfield units) and perisplenic  fluid (approximately 51.95 Hounsfield units) is seen. A moderate amount of pelvic free fluid is also noted (approximately 37.52 Hounsfield units). Musculoskeletal: Multilevel degenerative changes are seen throughout the lumbar spine. IMPRESSION: 1. Evidence of the patient's recent partial right nephrectomy with a 5.1 cm x 4.3 cm x 6.4 cm postoperative hematoma seen along the posterior and lateral aspects of the mid and upper right kidney. While there is no definite evidence to suggest active bleeding, and associated developing abscess cannot be excluded. 2. Moderate severity, predominantly anterior urinary bladder wall thickening which may represent sequelae associated with cystitis. Correlation with urinalysis is recommended. 3. Mild to moderate amount of perihepatic and perisplenic fluid, as well as a moderate amount of pelvic free fluid. A hemorrhagic component cannot be excluded. 4. Colonic diverticulosis. 5. Aortic atherosclerosis. Aortic Atherosclerosis  (ICD10-I70.0). Electronically Signed   By: Aram Candela M.D.   On: 11/02/2023 01:33   NM Pulmonary Perf and Vent Result Date: 10/22/2023 CLINICAL DATA:  Short of breath. Concern for acute pulmonary embolism. EXAM: NUCLEAR MEDICINE PERFUSION LUNG SCAN TECHNIQUE: Perfusion images were obtained in multiple projections after intravenous injection of radiopharmaceutical. Ventilation scans intentionally deferred if perfusion scan and chest x-ray adequate for interpretation during COVID 19 epidemic. RADIOPHARMACEUTICALS:  4.4 mCi Tc-47m MAA IV COMPARISON:  10/21/2023 FINDINGS: No wedge-shaped peripheral perfusion defect within LEFT or RIGHT lung to suggest acute pulmonary embolism. Normal perfusion pattern. IMPRESSION: No evidence of acute pulmonary embolism. Electronically Signed   By: Genevive Bi M.D.   On: 10/22/2023 11:24   DG CHEST PORT 1 VIEW Result Date: 10/21/2023 CLINICAL DATA:  Chest pain. EXAM: PORTABLE CHEST 1 VIEW COMPARISON:  CT 06/30/2023.  Radiographs 12/05/2019 and 04/08/2011. FINDINGS: 1406 hours. Suboptimal inspiration with resulting mild vascular crowding in both lung bases. There is no edema, confluent airspace disease, pleural effusion or pneumothorax. The heart size and mediastinal contours are stable. No osseous abnormalities are identified. IMPRESSION: No evidence of acute cardiopulmonary process. Suboptimal inspiration. Electronically Signed   By: Carey Bullocks M.D.   On: 10/21/2023 15:00     Subjective: Patient seen examined bedside, lying in bed.  Family present.  Also friend and watching via FaceTime on telephone.  Assisted with interpretation with sign language translator Loraine Leriche was present at bedside.  Urology and primary RN also present at bedside.  Hemoglobin stable, 7.8 this morning.  Receiving second dose of IV iron.  Multiple questions answered during visit.  Will be starting on oral iron supplementation on discharge given that he refuses blood product administration  due to his Jehovah's Witness status.  Surgical pain much better controlled.  Urology okay for discharge with plan outpatient follow-up in 1 week.  Patient with no other specific complaints, concerns or questions at this time.  Denies headache, no dizziness, no chest pain, no shortness of breath, no abdominal pain, no fever/chills, no nausea/vomiting/diarrhea.  No acute events overnight per nursing staff.  Discharge Exam: Vitals:   11/03/23 2103 11/04/23 0622  BP: 112/61 121/71  Pulse: 83 69  Resp: 18 18  Temp: 98.7 F (37.1 C) 97.6 F (36.4 C)  SpO2: 96% 98%   Vitals:   11/03/23 0521 11/03/23 1325 11/03/23 2103 11/04/23 0622  BP: 118/61 121/63 112/61 121/71  Pulse: 76 (!) 102 83 69  Resp: 19 18 18 18   Temp: 98.3 F (36.8 C) 98.3 F (36.8 C) 98.7 F (37.1 C) 97.6 F (36.4 C)  TempSrc: Oral Oral Oral Oral  SpO2: 96% 99% 96% 98%  Weight:  Height:        Physical Exam: GEN: NAD, alert and oriented x 3, obese HEENT: NCAT, PERRL, EOMI, sclera clear, MMM PULM: CTAB w/o wheezes/crackles, normal respiratory effort, on room air CV: RRR w/o M/G/R GI: abd soft, NTND, NABS, no R/G/M MSK: no peripheral edema, muscle strength globally intact 5/5 bilateral upper/lower extremities NEURO: CN II-XII intact, no focal deficits, sensation to light touch intact PSYCH: normal mood/affect Integumentary: dry/intact, no rashes or wounds    The results of significant diagnostics from this hospitalization (including imaging, microbiology, ancillary and laboratory) are listed below for reference.     Microbiology: Recent Results (from the past 240 hours)  Blood culture (routine x 2)     Status: None (Preliminary result)   Collection Time: 11/02/23  4:50 AM   Specimen: BLOOD LEFT ARM  Result Value Ref Range Status   Specimen Description   Final    BLOOD LEFT ARM Performed at [facility], [address] [place], [place] [address]    Special Requests   Final    BOTTLES DRAWN  AEROBIC AND ANAEROBIC Blood Culture adequate volume Performed at [facility], [address], [place], [place] [address]    Culture   Final    NO GROWTH 2 DAYS Performed at [facility], [address], [place], [place] [address]    Report Status PENDING  Incomplete  Blood culture (routine x 2)     Status: None (Preliminary result)   Collection Time: 11/02/23  5:00 AM   Specimen: BLOOD RIGHT ARM  Result Value Ref Range Status   Specimen Description   Final    BLOOD RIGHT ARM Performed at [facility], [address] [place], [place] [address]    Special Requests   Final    BOTTLES DRAWN AEROBIC AND ANAEROBIC Blood Culture adequate volume Performed at [facility], [address], [place], [place] [address]    Culture   Final    NO GROWTH 2 DAYS Performed at [facility], [address], [place], [place] [address]    Report Status PENDING  Incomplete     Labs: BNP (last 3 results) No results for input(s): "BNP" in the last 8760 hours. Basic Metabolic Panel: Recent Labs  Lab 11/01/23 1721 11/02/23 0525  NA 133* 134*  K 3.8 3.5  CL 98 101  CO2 23 25  GLUCOSE 167* 134*  BUN 20 16  CREATININE 1.36* 1.23  CALCIUM 8.9 8.5*   Liver Function Tests: Recent Labs  Lab 11/01/23 1721  AST 27  ALT 28  ALKPHOS 68  BILITOT 1.2  PROT 7.6  ALBUMIN 3.9   Recent Labs  Lab 11/01/23 1721  LIPASE 44   Recent Labs  Lab 11/01/23 1721  AMMONIA 40*   CBC: Recent Labs  Lab 11/01/23 1721 11/02/23 0525 11/03/23 0525 11/03/23 1217 11/03/23 1647 11/03/23 2357 11/04/23 0516  WBC 9.1  --   --   --   --   --   --   NEUTROABS 6.1  --   --   --   --   --   --   HGB 8.0*   < > 7.5* 8.3* 8.0* 7.7* 7.8*  HCT 25.1*   < > 24.1* 26.4* 26.2* 25.4* 25.8*  MCV 92.3  --   --   --   --   --   --   PLT 383  --   --   --   --   --   --    < > =  values in this interval not displayed.   Cardiac Enzymes: No results for input(s):  "CKTOTAL", "CKMB", "CKMBINDEX", "TROPONINI" in the last 168 hours. BNP: Invalid input(s): "POCBNP" CBG: Recent Labs  Lab 11/03/23 0751 11/03/23 1144 11/03/23 1622 11/03/23 2154 11/04/23 0739  GLUCAP 143* 188* 157* 176* 130*   D-Dimer No results for input(s): "DDIMER" in the last 72 hours. Hgb A1c No results for input(s): "HGBA1C" in the last 72 hours. Lipid Profile No results for input(s): "CHOL", "HDL", "LDLCALC", "TRIG", "CHOLHDL", "LDLDIRECT" in the last 72 hours. Thyroid function studies No results for input(s): "TSH", "T4TOTAL", "T3FREE", "THYROIDAB" in the last 72 hours.  Invalid input(s): "FREET3" Anemia work up Recent Labs    11/03/23 0525  TIBC 322  IRON 52   Urinalysis    Component Value Date/Time   COLORURINE YELLOW 11/01/2023 1742   APPEARANCEUR CLEAR 11/01/2023 1742   LABSPEC 1.028 11/01/2023 1742   PHURINE 5.0 11/01/2023 1742   GLUCOSEU >=500 (A) 11/01/2023 1742   HGBUR MODERATE (A) 11/01/2023 1742   BILIRUBINUR NEGATIVE 11/01/2023 1742   BILIRUBINUR negative 04/17/2021 1112   KETONESUR NEGATIVE 11/01/2023 1742   PROTEINUR NEGATIVE 11/01/2023 1742   UROBILINOGEN 0.2 04/17/2021 1112   UROBILINOGEN 0.2 04/08/2011 1702   NITRITE NEGATIVE 11/01/2023 1742   LEUKOCYTESUR NEGATIVE 11/01/2023 1742   Sepsis Labs Recent Labs  Lab 11/01/23 1721  WBC 9.1   Microbiology Recent Results (from the past 240 hours)  Blood culture (routine x 2)     Status: None (Preliminary result)   Collection Time: 11/02/23  4:50 AM   Specimen: BLOOD LEFT ARM  Result Value Ref Range Status   Specimen Description   Final    BLOOD LEFT ARM Performed at [facility], [address], [place], [place] [address]    Special Requests   Final    BOTTLES DRAWN AEROBIC AND ANAEROBIC Blood Culture adequate volume Performed at [facility], [address] [place], [place] [address]    Culture   Final    NO GROWTH 2 DAYS Performed at [facility], [address] [place], [place] [address]    Report Status PENDING  Incomplete  Blood culture (routine x 2)     Status: None (Preliminary result)   Collection Time: 11/02/23  5:00 AM   Specimen: BLOOD RIGHT ARM  Result Value Ref Range Status   Specimen Description   Final    BLOOD RIGHT ARM Performed at [facility], [address], [place], [place] [address]    Special Requests   Final    BOTTLES DRAWN AEROBIC AND ANAEROBIC Blood Culture adequate volume Performed at [facility], [address] [place], [place] [address]    Culture   Final    NO GROWTH 2 DAYS Performed at [facility], [address], [place], [place] [address]    Report Status PENDING  Incomplete     Time coordinating discharge: Over 30 minutes  SIGNED:   Alvira Philips [place], DO  [facility] 11/04/2023, 9:44 AM

## 2023-11-05 ENCOUNTER — Encounter: Payer: Self-pay | Admitting: Nurse Practitioner

## 2023-11-05 ENCOUNTER — Telehealth: Payer: Self-pay

## 2023-11-05 ENCOUNTER — Telehealth: Payer: Self-pay | Admitting: *Deleted

## 2023-11-05 NOTE — Transitions of Care (Post Inpatient/ED Visit) (Signed)
   11/05/2023  Name: Declyn Delsol MRN: [id number] DOB: 1966/06/13  Today's TOC FU Call Status: Today's TOC FU Call Status:: Unsuccessful Call (1st Attempt) Unsuccessful Call (1st Attempt) Date: 11/05/23  Attempted to reach the patient regarding the most recent Inpatient/ED visit.  Follow Up Plan: Additional outreach attempts will be made to reach the patient to complete the Transitions of Care (Post Inpatient/ED visit) call.   Irving Shows [facility], BSN RN Care Manager/ Transition of Care [place]/ [facility] [phone]

## 2023-11-05 NOTE — Transitions of Care (Post Inpatient/ED Visit) (Signed)
   11/05/2023  Name: Stephen Chase MRN: [id number] DOB: [birth year]  Today's TOC FU Call Status: Today's TOC FU Call Status:: Successful TOC FU Call Completed TOC FU Call Complete Date: 11/05/23 Patient's Name and Date of Birth confirmed.  Transition Care Management Follow-up Telephone Call Date of Discharge: 11/04/23 Discharge Facility: [facility]) Type of Discharge: Inpatient Admission Primary Inpatient Discharge Diagnosis:: Hematoma How have you been since you were released from the hospital?: Same Any questions or concerns?: No  Items Reviewed: Did you receive and understand the discharge instructions provided?: Yes Medications obtained,verified, and reconciled?: Yes (Medications Reviewed) Any new allergies since your discharge?: No Dietary orders reviewed?: NA Do you have support at home?: Yes People in Home: spouse  Medications Reviewed Today: Medications Reviewed Today   Medications were not reviewed in this encounter     Home Care and Equipment/Supplies: Were Home Health Services Ordered?: No Any new equipment or medical supplies ordered?: No  Functional Questionnaire: Do you need assistance with bathing/showering or dressing?: Yes Do you need assistance with meal preparation?: No Do you need assistance with eating?: No Do you have difficulty maintaining continence: No Do you need assistance with getting out of bed/getting out of a chair/moving?: Yes Do you have difficulty managing or taking your medications?: No  Follow up appointments reviewed: PCP Follow-up appointment confirmed?: Yes Date of PCP follow-up appointment?: 11/08/23 Follow-up Provider: [name] Specialist [facility] Follow-up appointment confirmed?: No Do you need transportation to your follow-up appointment?: No Do you understand care options if your condition(s) worsen?: Yes-patient verbalized understanding    SIGNATURE Lisabeth Devoid, CMA

## 2023-11-07 LAB — CULTURE, BLOOD (ROUTINE X 2)
Culture: NO GROWTH
Culture: NO GROWTH
Special Requests: ADEQUATE
Special Requests: ADEQUATE

## 2023-11-08 ENCOUNTER — Encounter: Payer: Self-pay | Admitting: Nurse Practitioner

## 2023-11-08 ENCOUNTER — Ambulatory Visit: Payer: Medicare PPO | Admitting: Nurse Practitioner

## 2023-11-08 ENCOUNTER — Telehealth: Payer: Self-pay

## 2023-11-08 VITALS — BP 130/68 | HR 89 | Temp 98.1°F | Ht 69.0 in | Wt 214.2 lb

## 2023-11-08 DIAGNOSIS — C641 Malignant neoplasm of right kidney, except renal pelvis: Secondary | ICD-10-CM | POA: Insufficient documentation

## 2023-11-08 DIAGNOSIS — I10 Essential (primary) hypertension: Secondary | ICD-10-CM | POA: Diagnosis not present

## 2023-11-08 DIAGNOSIS — D62 Acute posthemorrhagic anemia: Secondary | ICD-10-CM | POA: Diagnosis not present

## 2023-11-08 DIAGNOSIS — T148XXA Other injury of unspecified body region, initial encounter: Secondary | ICD-10-CM

## 2023-11-08 DIAGNOSIS — Z905 Acquired absence of kidney: Secondary | ICD-10-CM | POA: Diagnosis not present

## 2023-11-08 DIAGNOSIS — Z2821 Immunization not carried out because of patient refusal: Secondary | ICD-10-CM

## 2023-11-08 DIAGNOSIS — Z09 Encounter for follow-up examination after completed treatment for conditions other than malignant neoplasm: Secondary | ICD-10-CM | POA: Insufficient documentation

## 2023-11-08 LAB — CBC WITH DIFFERENTIAL/PLATELET
Basophils Absolute: 0.1 10*3/uL (ref 0.0–0.2)
Basos: 1 %
EOS (ABSOLUTE): 0.4 10*3/uL (ref 0.0–0.4)
Eos: 7 %
Hematocrit: 31.6 % — ABNORMAL LOW (ref 37.5–51.0)
Hemoglobin: 10.4 g/dL — ABNORMAL LOW (ref 13.0–17.7)
Immature Grans (Abs): 0 10*3/uL (ref 0.0–0.1)
Immature Granulocytes: 1 %
Lymphocytes Absolute: 1.7 10*3/uL (ref 0.7–3.1)
Lymphs: 25 %
MCH: 30.5 pg (ref 26.6–33.0)
MCHC: 32.9 g/dL (ref 31.5–35.7)
MCV: 93 fL (ref 79–97)
Monocytes Absolute: 0.4 10*3/uL (ref 0.1–0.9)
Monocytes: 6 %
Neutrophils Absolute: 3.9 10*3/uL (ref 1.4–7.0)
Neutrophils: 60 %
Platelets: 390 10*3/uL (ref 150–450)
RBC: 3.41 x10E6/uL — ABNORMAL LOW (ref 4.14–5.80)
RDW: 13.1 % (ref 11.6–15.4)
WBC: 6.5 10*3/uL (ref 3.4–10.8)

## 2023-11-08 NOTE — Progress Notes (Signed)
[name], CMA,acting as a Neurosurgeon for [name], FNP.,have documented all relevant documentation on the behalf of [name], FNP,as directed by  [name], FNP while in the presence of [name], FNP.  Subjective:  Patient ID: Stephen Chase , male    DOB: [birth year] , 58 y.o.   MRN: [id number]  Chief Complaint  Patient presents with   Hospital discharge    HPI  Patient presents today for a hospital follow up after being admitted from 11/01/2023-11/04/2023, after having a low hemoglobin, lowest was 7.4. Patient was diagnosed with Hematoma. Due to him being a Jehovah's Witness he did not receive any blood products.  He had partial nephrectomy done on 10/20/2023 and had a tumor removed. Has a new diagnosis of right renal cell carcinoma. Patient reports compliance with medication. Patient denies any chest pain, SOB, or headaches. ,patient reports today he is feeling better. Patient would like to discuss a few of his medication changes. Patients his surgery site is still a little achy but not too bad. Patient would like his blood count checked again.   He is going to the Urologist tomorrow. He is not taking his blood pressure medications since discharge due to his blood pressure being low. He has been checking his blood pressure and it has been ranging 112/71-127/82.   The sign language interpreter Loraine Leriche was present during visit      Past Medical History:  Diagnosis Date   Allergy    Arthritis    SHOULDERS   Balanitis    Chronic back pain    CKD (chronic kidney disease)    Deafness, sensorineural, childhood onset    BILATERAL SINCE AGE 59  SECONDARY TO UNKNOWN ILLNESS   Esophageal dysphagia 11/11/2022   Esophageal varices without bleeding (HCC) 05/09/2018   GERD (gastroesophageal reflux disease)    Hyperlipidemia    Hypertension    Lumbar radiculopathy 02/18/2023   Lumbar spondylosis 05/07/2023   No blood products    Patient is Jehovah Witness no blood products.   OSA on  CPAP    SEVERE PER STUDY 03-09-2009   Right renal mass    Smokers' cough (HCC)    Type 2 diabetes mellitus (HCC)      Family History  Problem Relation Age of Onset   Cancer Mother        type unknown pt was 30 years old at that time   Liver disease Father    Healthy Brother    Cancer Other         alot of family members have cancer but doesnt know the type   Colon cancer Neg Hx    Esophageal cancer Neg Hx    Rectal cancer Neg Hx    Stomach cancer Neg Hx    Sleep apnea Neg Hx      Current Outpatient Medications:    Accu-Chek Softclix Lancets lancets, Use as instructed to test blood sugars 3 times a day dx code e11.65, Disp: 200 each, Rfl: 3   atorvastatin (LIPITOR) 40 MG tablet, TAKE 1 TABLET(40 MG) BY MOUTH AT BEDTIME, Disp: , Rfl:    Blood Glucose Monitoring Suppl (ACCU-CHEK GUIDE) w/Device KIT, Use to check blood sugars three times daily E11.9, Disp: 1 kit, Rfl: 0   Dapagliflozin Propanediol (FARXIGA PO), Take 10 mg by mouth daily., Disp: , Rfl:    docusate sodium (COLACE) 100 MG capsule, Take 1 capsule (100 mg total) by mouth 2 (two) times daily., Disp: , Rfl:  ezetimibe (ZETIA) 10 MG tablet, Take 1 tablet (10 mg total) by mouth daily., Disp: 90 tablet, Rfl: 3   fenofibrate (TRICOR) 145 MG tablet, TAKE 1 TABLET(145 MG) BY MOUTH DAILY, Disp: 90 tablet, Rfl: 1   ferrous sulfate 325 (65 FE) MG EC tablet, Take 1 tablet (325 mg total) by mouth daily with breakfast., Disp: 90 tablet, Rfl: 0   glucose blood (ACCU-CHEK GUIDE) test strip, CHECK BLOOD SUGAR 3 TIMES A DAY BEFORE BREAKFAST, LUNCH, AND DINNER, Disp: 150 strip, Rfl: 2   glucose blood test strip, Use as instructed to test blood sugars 3 times a day dx code e11.65, Disp: 100 each, Rfl: 12   HYDROcodone-acetaminophen (NORCO) 5-325 MG tablet, Take 1-2 tablets by mouth every 6 (six) hours as needed for moderate pain (pain score 4-6) or severe pain (pain score 7-10)., Disp: 20 tablet, Rfl: 0   icosapent Ethyl (VASCEPA) 1 g  capsule, One bid for elevated triglycerides, Disp: 180 capsule, Rfl: 3   modafinil (PROVIGIL) 200 MG tablet, Take 1 tablet (200 mg total) by mouth daily., Disp: 30 tablet, Rfl: 5   omeprazole (PRILOSEC) 10 MG capsule, TAKE 1 CAPSULE(10 MG) BY MOUTH DAILY, Disp: 90 capsule, Rfl: 1   tamsulosin (FLOMAX) 0.4 MG CAPS capsule, TAKE 1 CAPSULE BY MOUTH EVERY DAY 30 MINUTES AFTER THE SAME MEAL, Disp: 90 capsule, Rfl: 3   UNABLE TO FIND, CPAP for OSA (6cm-19cm), Disp: , Rfl:    [Paused] carvedilol (COREG) 3.125 MG tablet, Take 1 tablet (3.125 mg total) by mouth 2 (two) times daily. (Patient not taking: Reported on 11/08/2023), Disp: 180 tablet, Rfl: 3   Allergies  Allergen Reactions   Other     Blood Refusal      Review of Systems  Constitutional: Negative.   HENT: Negative.    Eyes: Negative.   Respiratory: Negative.  Negative for shortness of breath.   Cardiovascular: Negative.  Negative for chest pain and palpitations.  Gastrointestinal: Negative.   Endocrine: Negative for polydipsia, polyphagia and polyuria.  Neurological: Negative.  Negative for dizziness.  Psychiatric/Behavioral: Negative.       Today's Vitals   11/08/23 1031  BP: 130/68  Pulse: 89  Temp: 98.1 F (36.7 C)  TempSrc: Oral  Weight: 214 lb 3.2 oz (97.2 kg)  Height: 5\' 9"  (1.753 m)  PainSc: 2   PainLoc: Abdomen   Body mass index is 31.63 kg/m.  Wt Readings from Last 3 Encounters:  11/08/23 214 lb 3.2 oz (97.2 kg)  11/01/23 211 lb (95.7 kg)  10/20/23 209 lb 7 oz (95 kg)     Objective:  Physical Exam Vitals reviewed.  Constitutional:      General: He is not in acute distress.    Appearance: Normal appearance. He is obese.  Eyes:     Pupils: Pupils are equal, round, and reactive to light.  Cardiovascular:     Rate and Rhythm: Normal rate and regular rhythm.     Pulses: Normal pulses.     Heart sounds: Normal heart sounds. No murmur heard. Pulmonary:     Effort: Pulmonary effort is normal. No respiratory  distress.     Breath sounds: Normal breath sounds. No wheezing.  Musculoskeletal:     Right shoulder: Normal.  Skin:    General: Skin is warm and dry.     Capillary Refill: Capillary refill takes less than 2 seconds.  Neurological:     General: No focal deficit present.     Mental Status: He is alert  and oriented to person, place, and time.     Cranial Nerves: No cranial nerve deficit.     Motor: No weakness.  Psychiatric:        Mood and Affect: Mood normal.        Behavior: Behavior normal.        Thought Content: Thought content normal.        Judgment: Judgment normal.     Assessment And Plan:  Hospital discharge follow-up  Hematoma Assessment & Plan: TCM Performed. A member of the clinical team spoke with the patient upon dischare. Discharge summary was reviewed in full detail during the visit. Meds reconciled and compared to discharge meds. Medication list is updated and reviewed with the patient.  Greater than 50% face to face time was spent in counseling an coordination of care.  All questions were answered to the satisfaction of the patient.   He does not have PT and is overall doing well.   Orders: -     CBC with Differential/Platelet  Anemia due to acute blood loss Assessment & Plan: He has been given iron to take daily and his Hgb decreased down to 6.6 but did not have a transfusion since he is a Jehovah's Witness. Will recheck level today advised we may need to repeat in a few days as this is before the week time frame.    Renal cell carcinoma of right kidney [facility]) Assessment & Plan: This is a new diagnosis and he is being followed by Nephrology   Essential hypertension Assessment & Plan: Blood pressure is well controlled, he is to continue checking his blood pressure and sending the readings if over 140/90 also to send a message. We will need to restart his medication.    Status post nephrectomy Assessment & Plan: Continue f/u with Nephrology   COVID-19  vaccination declined Assessment & Plan: Declines covid 19 vaccine. Discussed risk of covid 66 and if he changes her mind about the vaccine to call the office. Education has been provided regarding the importance of this vaccine but patient still declined. Advised may receive this vaccine at local pharmacy or Health Dept.or vaccine clinic. Aware to provide a copy of the vaccination record if obtained from local pharmacy or Health Dept.  Encouraged to take multivitamin, vitamin d, vitamin c and zinc to increase immune system. Aware can call office if would like to have vaccine here at office. Verbalized acceptance and understanding.     Return for keep same next. .   Patient was given opportunity to ask questions. Patient verbalized understanding of the plan and was able to repeat key elements of the plan. All questions were answered to their satisfaction.   Jeanell Sparrow, FNP, have reviewed all documentation for this visit. The documentation on 11/08/23 for the exam, diagnosis, procedures, and orders are all accurate and complete.   IF YOU HAVE BEEN REFERRED TO A SPECIALIST, IT MAY TAKE 1-2 WEEKS TO SCHEDULE/PROCESS THE REFERRAL. IF YOU HAVE NOT HEARD FROM US/SPECIALIST IN TWO WEEKS, PLEASE GIVE [place] A CALL AT [phone].

## 2023-11-08 NOTE — Assessment & Plan Note (Signed)

## 2023-11-08 NOTE — Assessment & Plan Note (Signed)
This is a new diagnosis and he is being followed by Nephrology

## 2023-11-08 NOTE — Patient Instructions (Signed)
Continue to check your blood pressure daily and send the readings on Mondays and Thursdays. If over 140/90 send a message because you will likely have to restart your blood pressure medications.

## 2023-11-08 NOTE — Assessment & Plan Note (Signed)
 Continue f/u with Nephrology

## 2023-11-08 NOTE — Assessment & Plan Note (Signed)
He has been given iron to take daily and his Hgb decreased down to 6.6 but did not have a transfusion since he is a [employer] Witness. Will recheck level today advised we may need to repeat in a few days as this is before the week time frame.

## 2023-11-08 NOTE — Assessment & Plan Note (Signed)
Blood pressure is well controlled, he is to continue checking his blood pressure and sending the readings if over 140/90 also to send a message. We will need to restart his medication.

## 2023-11-08 NOTE — Assessment & Plan Note (Signed)
TCM Performed. A member of the clinical team spoke with the patient upon dischare. Discharge summary was reviewed in full detail during the visit. Meds reconciled and compared to discharge meds. Medication list is updated and reviewed with the patient.  Greater than 50% face to face time was spent in counseling an coordination of care.  All questions were answered to the satisfaction of the patient.   He does not have PT and is overall doing well.

## 2023-11-08 NOTE — Transitions of Care (Post Inpatient/ED Visit) (Signed)
   11/08/2023  Name: Stephen Chase MRN: [id number] DOB: 18-Apr-1966  Today's TOC FU Call Status: Today's TOC FU Call Status:: Unsuccessful Call (2nd Attempt) Unsuccessful Call (2nd Attempt) Date: 11/08/23  Attempted to reach the patient regarding the most recent Inpatient/ED visit.  Follow Up Plan: Additional outreach attempts will be made to reach the patient to complete the Transitions of Care (Post Inpatient/ED visit) call.   Alyse Low, RN, BA, [facility], CRRN [facility] Coordinator, Transition of Care Ph # [phone]

## 2023-11-09 ENCOUNTER — Telehealth: Payer: Self-pay

## 2023-11-09 ENCOUNTER — Encounter: Payer: Self-pay | Admitting: Nurse Practitioner

## 2023-11-09 DIAGNOSIS — C641 Malignant neoplasm of right kidney, except renal pelvis: Secondary | ICD-10-CM | POA: Diagnosis not present

## 2023-11-09 DIAGNOSIS — R8279 Other abnormal findings on microbiological examination of urine: Secondary | ICD-10-CM | POA: Diagnosis not present

## 2023-11-09 NOTE — Transitions of Care (Post Inpatient/ED Visit) (Signed)
   11/09/2023  Name: Stephen Chase MRN: [id number] DOB: [birth year]  Today's TOC FU Call Status: Today's TOC FU Call Status:: Unsuccessful Call (3rd Attempt) Unsuccessful Call (3rd Attempt) Date: 11/09/23  Attempted to reach the patient regarding the most recent Inpatient/ED visit.  Follow Up Plan: No further outreach attempts will be made at this time. We have been unable to contact the patient.  Alyse Low, RN, BA, [facility], CRRN [facility] Coordinator, Transition of Care Ph # [phone]

## 2023-11-12 DIAGNOSIS — G4733 Obstructive sleep apnea (adult) (pediatric): Secondary | ICD-10-CM | POA: Diagnosis not present

## 2023-11-15 ENCOUNTER — Encounter: Payer: Self-pay | Admitting: Nurse Practitioner

## 2023-11-19 ENCOUNTER — Encounter: Payer: Self-pay | Admitting: Adult Health

## 2023-11-19 ENCOUNTER — Other Ambulatory Visit: Payer: Self-pay | Admitting: Adult Health

## 2023-11-19 DIAGNOSIS — G4710 Hypersomnia, unspecified: Secondary | ICD-10-CM

## 2023-11-22 ENCOUNTER — Encounter: Payer: Self-pay | Admitting: Nurse Practitioner

## 2023-11-22 NOTE — Telephone Encounter (Signed)
 Patient last visit was on 06/10/23 and upcoming visit on 10/14/24. Pt last Rx refill was on 10/18/23.

## 2023-11-23 NOTE — Telephone Encounter (Signed)
 Pt called, pharmacy did not have refill. Informed nurse sent on 11/22/23 and pharmacy confirm at 2:44 pm

## 2023-11-23 NOTE — Telephone Encounter (Signed)
 Spoke to [employer]. The Modafinil is ready for pickup. [phone].

## 2023-11-24 ENCOUNTER — Encounter: Payer: Self-pay | Admitting: Nurse Practitioner

## 2023-11-24 ENCOUNTER — Encounter (HOSPITAL_BASED_OUTPATIENT_CLINIC_OR_DEPARTMENT_OTHER): Payer: Self-pay | Admitting: Internal Medicine

## 2023-11-25 ENCOUNTER — Encounter: Payer: Self-pay | Admitting: Nurse Practitioner

## 2023-11-25 ENCOUNTER — Other Ambulatory Visit: Payer: Self-pay | Admitting: Nurse Practitioner

## 2023-11-30 ENCOUNTER — Encounter: Payer: Self-pay | Admitting: Nurse Practitioner

## 2023-12-05 ENCOUNTER — Encounter: Payer: Self-pay | Admitting: Nurse Practitioner

## 2023-12-10 DIAGNOSIS — G4733 Obstructive sleep apnea (adult) (pediatric): Secondary | ICD-10-CM | POA: Diagnosis not present

## 2023-12-13 ENCOUNTER — Encounter: Payer: Self-pay | Admitting: Nurse Practitioner

## 2023-12-14 ENCOUNTER — Encounter: Payer: Self-pay | Admitting: Nurse Practitioner

## 2023-12-14 ENCOUNTER — Other Ambulatory Visit: Payer: Self-pay

## 2023-12-14 ENCOUNTER — Ambulatory Visit: Admitting: Nurse Practitioner

## 2023-12-14 VITALS — BP 132/80 | HR 74 | Temp 98.2°F | Ht 69.0 in | Wt 214.0 lb

## 2023-12-14 DIAGNOSIS — I10 Essential (primary) hypertension: Secondary | ICD-10-CM | POA: Diagnosis not present

## 2023-12-14 DIAGNOSIS — E785 Hyperlipidemia, unspecified: Secondary | ICD-10-CM

## 2023-12-14 DIAGNOSIS — E6609 Other obesity due to excess calories: Secondary | ICD-10-CM

## 2023-12-14 DIAGNOSIS — E781 Pure hyperglyceridemia: Secondary | ICD-10-CM

## 2023-12-14 DIAGNOSIS — E1142 Type 2 diabetes mellitus with diabetic polyneuropathy: Secondary | ICD-10-CM

## 2023-12-14 DIAGNOSIS — Z87891 Personal history of nicotine dependence: Secondary | ICD-10-CM

## 2023-12-14 DIAGNOSIS — Z1211 Encounter for screening for malignant neoplasm of colon: Secondary | ICD-10-CM

## 2023-12-14 DIAGNOSIS — D62 Acute posthemorrhagic anemia: Secondary | ICD-10-CM | POA: Diagnosis not present

## 2023-12-14 DIAGNOSIS — C641 Malignant neoplasm of right kidney, except renal pelvis: Secondary | ICD-10-CM | POA: Diagnosis not present

## 2023-12-14 DIAGNOSIS — E66811 Obesity, class 1: Secondary | ICD-10-CM | POA: Diagnosis not present

## 2023-12-14 DIAGNOSIS — Z23 Encounter for immunization: Secondary | ICD-10-CM | POA: Diagnosis not present

## 2023-12-14 DIAGNOSIS — E1169 Type 2 diabetes mellitus with other specified complication: Secondary | ICD-10-CM

## 2023-12-14 DIAGNOSIS — Z6831 Body mass index (BMI) 31.0-31.9, adult: Secondary | ICD-10-CM

## 2023-12-14 DIAGNOSIS — G4733 Obstructive sleep apnea (adult) (pediatric): Secondary | ICD-10-CM | POA: Diagnosis not present

## 2023-12-14 LAB — CBC
Hematocrit: 45.9 % (ref 37.5–51.0)
Hemoglobin: 14.9 g/dL (ref 13.0–17.7)
MCH: 29.8 pg (ref 26.6–33.0)
MCHC: 32.5 g/dL (ref 31.5–35.7)
MCV: 92 fL (ref 79–97)
Platelets: 164 10*3/uL (ref 150–450)
RBC: 5 x10E6/uL (ref 4.14–5.80)
RDW: 13.5 % (ref 11.6–15.4)
WBC: 5.8 10*3/uL (ref 3.4–10.8)

## 2023-12-14 LAB — CMP14+EGFR
ALT: 22 IU/L (ref 0–44)
AST: 29 IU/L (ref 0–40)
Albumin: 4.6 g/dL (ref 3.8–4.9)
Alkaline Phosphatase: 81 IU/L (ref 44–121)
BUN/Creatinine Ratio: 15 (ref 9–20)
BUN: 17 mg/dL (ref 6–24)
Bilirubin Total: 0.4 mg/dL (ref 0.0–1.2)
CO2: 22 mmol/L (ref 20–29)
Calcium: 10.3 mg/dL — ABNORMAL HIGH (ref 8.7–10.2)
Chloride: 100 mmol/L (ref 96–106)
Creatinine, Ser: 1.17 mg/dL (ref 0.76–1.27)
Globulin, Total: 2.6 g/dL (ref 1.5–4.5)
Glucose: 122 mg/dL — ABNORMAL HIGH (ref 70–99)
Potassium: 3.9 mmol/L (ref 3.5–5.2)
Sodium: 138 mmol/L (ref 134–144)
Total Protein: 7.2 g/dL (ref 6.0–8.5)
eGFR: 73 mL/min/{1.73_m2} (ref >59)

## 2023-12-14 LAB — LIPID PANEL
Chol/HDL Ratio: 5.8 ratio — ABNORMAL HIGH (ref 0.0–5.0)
Cholesterol, Total: 161 mg/dL (ref 100–199)
HDL: 28 mg/dL — ABNORMAL LOW (ref >39)
LDL Chol Calc (NIH): 69 mg/dL (ref 0–99)
Triglycerides: 409 mg/dL — ABNORMAL HIGH (ref 0–149)
VLDL Cholesterol Cal: 64 mg/dL — ABNORMAL HIGH (ref 5–40)

## 2023-12-14 MED ORDER — GLUCOSE BLOOD VI STRP: ORAL_STRIP | 12 refills | Status: DC

## 2023-12-14 NOTE — Progress Notes (Signed)
 I,Stephen J Llittleton, CMA,acting as a Neurosurgeon for [employer], FNP.,have documented all relevant documentation on the behalf of Stephen Felts, FNP,as directed by  Stephen Felts, FNP while in the presence of Stephen Felts, FNP.  Subjective:  Patient ID: Stephen Chase , male    DOB: 08/05/1966 , 58 y.o.   MRN: [id number]  Chief Complaint  Patient presents with   Diabetes   Hypertension    HPI  Patient presents today for a bp and dm follow up, Patient reports compliance with medication. Patient denies any chest pain, SOB, or headaches. Patient has no concerns today. His blood pressure is starting to increase slightly and has not been on there carvedilol. Continues to use his CPAP nightly  Diabetes He presents for his follow-up diabetic visit. He has type 2 diabetes mellitus. His disease course has been stable. Pertinent negatives for hypoglycemia include no dizziness. Pertinent negatives for diabetes include no chest pain, no polydipsia, no polyphagia and no polyuria. There are no hypoglycemic complications. Symptoms are stable. Diabetic complications include peripheral neuropathy. Risk factors for coronary artery disease include obesity, sedentary lifestyle, diabetes mellitus and male sex. Current diabetic treatment includes oral agent (dual therapy) and diet. His weight is stable. He is following a generally unhealthy diet. When asked about meal planning, he reported none. He has not had a previous visit with a dietitian. He participates in exercise intermittently (he is walking alot and working a strenuous job.). There is no change in his home blood glucose trend. (He has been checking his blood sugars but not sure of the readings) An ACE inhibitor/angiotensin II receptor blocker is being taken. He does not see a podiatrist.Eye exam is current.  Hypertension This is a chronic problem. The current episode started more than 1 year ago. The problem has been gradually improving since onset. The problem is  controlled. Pertinent negatives include no chest pain, palpitations or shortness of breath. There are no associated agents to hypertension. Risk factors for coronary artery disease include obesity, sedentary lifestyle, dyslipidemia, diabetes mellitus and male gender. Past treatments include ACE inhibitors. The current treatment provides significant improvement. There are no compliance problems.  There is no history of kidney disease. Identifiable causes of hypertension include chronic renal disease.     Past Medical History:  Diagnosis Date   Allergy    Arthritis    SHOULDERS   Balanitis    Chronic back pain    CKD (chronic kidney disease)    Deafness, sensorineural, childhood onset    BILATERAL SINCE AGE 89  SECONDARY TO UNKNOWN ILLNESS   Esophageal dysphagia 11/11/2022   Esophageal varices without bleeding (HCC) 05/09/2018   GERD (gastroesophageal reflux disease)    Hyperlipidemia    Hypertension    Lumbar radiculopathy 02/18/2023   Lumbar spondylosis 05/07/2023   No blood products    Patient is Jehovah Witness no blood products.   OSA on CPAP    SEVERE PER STUDY 03-09-2009   Right renal mass    Smokers' cough (HCC)    Type 2 diabetes mellitus (HCC)      Family History  Problem Relation Age of Onset   Cancer Mother        type unknown pt was 59 years old at that time   Liver disease Father    Healthy Brother    Cancer Other         alot of family members have cancer but doesnt know the type   Colon cancer Neg Hx  Esophageal cancer Neg Hx    Rectal cancer Neg Hx    Stomach cancer Neg Hx    Sleep apnea Neg Hx      Current Outpatient Medications:    Accu-Chek Softclix Lancets lancets, Use as instructed to test blood sugars 3 times a day dx code e11.65, Disp: 200 each, Rfl: 3   atorvastatin (LIPITOR) 40 MG tablet, TAKE 1 TABLET(40 MG) BY MOUTH AT BEDTIME, Disp: , Rfl:    Blood Glucose Monitoring Suppl (ACCU-CHEK GUIDE) w/Device KIT, Use to check blood sugars three times  daily E11.9, Disp: 1 kit, Rfl: 0   Dapagliflozin Propanediol (FARXIGA PO), Take 10 mg by mouth daily., Disp: , Rfl:    docusate sodium (COLACE) 100 MG capsule, Take 1 capsule (100 mg total) by mouth 2 (two) times daily., Disp: , Rfl:    ezetimibe (ZETIA) 10 MG tablet, Take 1 tablet (10 mg total) by mouth daily., Disp: 90 tablet, Rfl: 3   fenofibrate (TRICOR) 145 MG tablet, TAKE 1 TABLET(145 MG) BY MOUTH DAILY, Disp: 90 tablet, Rfl: 1   ferrous sulfate 325 (65 FE) MG EC tablet, Take 1 tablet (325 mg total) by mouth daily with breakfast., Disp: 90 tablet, Rfl: 0   glucose blood (ACCU-CHEK GUIDE) test strip, CHECK BLOOD SUGAR 3 TIMES A DAY BEFORE BREAKFAST, LUNCH, AND DINNER, Disp: 150 strip, Rfl: 2   HYDROcodone-acetaminophen (NORCO) 5-325 MG tablet, Take 1-2 tablets by mouth every 6 (six) hours as needed for moderate pain (pain score 4-6) or severe pain (pain score 7-10)., Disp: 20 tablet, Rfl: 0   icosapent Ethyl (VASCEPA) 1 g capsule, One bid for elevated triglycerides, Disp: 180 capsule, Rfl: 3   modafinil (PROVIGIL) 200 MG tablet, TAKE 1 TABLET(200 MG) BY MOUTH DAILY, Disp: 30 tablet, Rfl: 5   omeprazole (PRILOSEC) 10 MG capsule, TAKE 1 CAPSULE(10 MG) BY MOUTH DAILY, Disp: 90 capsule, Rfl: 2   tamsulosin (FLOMAX) 0.4 MG CAPS capsule, TAKE 1 CAPSULE BY MOUTH EVERY DAY 30 MINUTES AFTER THE SAME MEAL, Disp: 90 capsule, Rfl: 3   UNABLE TO FIND, CPAP for OSA (6cm-19cm), Disp: , Rfl:    carvedilol (COREG) 3.125 MG tablet, Take 1 tablet (3.125 mg total) by mouth 2 (two) times daily. (Patient not taking: Reported on 11/08/2023), Disp: 180 tablet, Rfl: 3   glucose blood test strip, Use as instructed to test blood sugars 3 times a day dx code e11.65, Disp: 100 each, Rfl: 12   Allergies  Allergen Reactions   Other     Blood Refusal      Review of Systems  Constitutional: Negative.   Eyes: Negative.   Respiratory:  Negative for shortness of breath.   Cardiovascular:  Negative for chest pain and  palpitations.  Endocrine: Negative for polydipsia, polyphagia and polyuria.  Musculoskeletal: Negative.   Skin: Negative.   Neurological:  Negative for dizziness.  Psychiatric/Behavioral: Negative.       Today's Vitals   12/14/23 0857  BP: 132/80  Pulse: 74  Temp: 98.2 F (36.8 C)  TempSrc: Oral  Weight: 214 lb (97.1 kg)  Height: 5\' 9"  (1.753 m)  PainSc: 0-No pain   Body mass index is 31.6 kg/m.  Wt Readings from Last 3 Encounters:  12/14/23 214 lb (97.1 kg)  11/08/23 214 lb 3.2 oz (97.2 kg)  11/01/23 211 lb (95.7 kg)     Objective:  Physical Exam Vitals and nursing note reviewed.  Constitutional:      General: He is not in acute distress.  Appearance: Normal appearance. He is obese.  Eyes:     Pupils: Pupils are equal, round, and reactive to light.  Cardiovascular:     Rate and Rhythm: Normal rate and regular rhythm.     Pulses: Normal pulses.     Heart sounds: Normal heart sounds. No murmur heard. Pulmonary:     Effort: Pulmonary effort is normal. No respiratory distress.     Breath sounds: Normal breath sounds. No wheezing.  Musculoskeletal:     Right shoulder: Normal.  Skin:    General: Skin is warm and dry.     Capillary Refill: Capillary refill takes less than 2 seconds.  Neurological:     General: No focal deficit present.     Mental Status: He is alert and oriented to person, place, and time.     Cranial Nerves: No cranial nerve deficit.     Motor: No weakness.  Psychiatric:        Mood and Affect: Mood normal.        Behavior: Behavior normal.        Thought Content: Thought content normal.        Judgment: Judgment normal.         Assessment And Plan:  Essential hypertension Assessment & Plan: Blood pressure is fairly controlled, but has been increasing slowly, he is to restart his coreg.   Orders: -     CMP14+EGFR  Type 2 diabetes mellitus with hyperlipidemia (HCC) Assessment & Plan: Hemoglobin A1c is improving.  He is tolerating his  medications well.    Orders: -     CMP14+EGFR  Hypertriglyceridemia Assessment & Plan: Cholesterol levels were increased with the triglycerides.  Will recheck today  Orders: -     Lipid panel  Class 1 obesity due to excess calories with body mass index (BMI) of 31.0 to 31.9 in adult, unspecified whether serious comorbidity present Assessment & Plan: Weight is stable. Continue focusing on healthy diet and regular physical activity with a goal BMI less than 30       Need for COVID-19 vaccine Assessment & Plan: Covid 19 vaccine given in office observed for 15 minutes without any adverse reaction   Orders: Best boy Vaccine 22yrs & older  OSA on CPAP Assessment & Plan: He is doing well with his CPAP.  Benefits from wearing it nightly.  Last machine was done 5 years ago.   Anemia due to acute blood loss Assessment & Plan: Will recheck levels today to see if this has resolved  Orders: -     CBC  History of smoking 25-50 pack years Assessment & Plan: Will recheck low dose CT scan  Orders: -     CT CHEST LUNG CANCER SCREENING LOW DOSE WO CONTRAST; Future  Encounter for screening colonoscopy -     Ambulatory referral to Gastroenterology  Renal cell carcinoma of right kidney [facility]) Assessment & Plan: Continue f/u with Urology     Return for move april appt to aug bp/dm check.  Patient was given opportunity to ask questions. Patient verbalized understanding of the plan and was able to repeat key elements of the plan. All questions were answered to their satisfaction.    Jeanell Sparrow, FNP, have reviewed all documentation for this visit. The documentation on 12/14/23 for the exam, diagnosis, procedures, and orders are all accurate and complete.   IF YOU HAVE BEEN REFERRED TO A SPECIALIST, IT MAY TAKE 1-2 WEEKS TO SCHEDULE/PROCESS THE  REFERRAL. IF YOU HAVE NOT HEARD FROM US/SPECIALIST IN TWO WEEKS, PLEASE GIVE [place] A CALL AT [phone].

## 2023-12-21 ENCOUNTER — Ambulatory Visit
Admission: RE | Admit: 2023-12-21 | Discharge: 2023-12-21 | Disposition: A | Discharge status: Home / Self Care | Source: Ambulatory Visit | Attending: Nurse Practitioner | Admitting: Nurse Practitioner

## 2023-12-21 DIAGNOSIS — Z87891 Personal history of nicotine dependence: Secondary | ICD-10-CM | POA: Diagnosis not present

## 2023-12-21 DIAGNOSIS — Z122 Encounter for screening for malignant neoplasm of respiratory organs: Secondary | ICD-10-CM | POA: Diagnosis not present

## 2023-12-26 ENCOUNTER — Encounter: Payer: Self-pay | Admitting: Nurse Practitioner

## 2023-12-26 DIAGNOSIS — Z23 Encounter for immunization: Secondary | ICD-10-CM | POA: Insufficient documentation

## 2023-12-26 DIAGNOSIS — Z87891 Personal history of nicotine dependence: Secondary | ICD-10-CM | POA: Insufficient documentation

## 2023-12-26 NOTE — Assessment & Plan Note (Signed)
He is doing well with his CPAP.  Benefits from wearing it nightly.  Last machine was done 5 years ago.

## 2023-12-26 NOTE — Assessment & Plan Note (Signed)
 Hemoglobin A1c is improving.  He is tolerating his medications well.

## 2023-12-26 NOTE — Assessment & Plan Note (Signed)
Continue f/u with Urology

## 2023-12-26 NOTE — Assessment & Plan Note (Signed)
 Covid 19 vaccine given in office observed for 15 minutes without any adverse reaction

## 2023-12-26 NOTE — Assessment & Plan Note (Signed)
 Cholesterol levels were increased with the triglycerides.  Will recheck today

## 2023-12-26 NOTE — Assessment & Plan Note (Signed)
 Will recheck low dose CT scan

## 2023-12-26 NOTE — Assessment & Plan Note (Signed)
 Weight is stable. Continue focusing on healthy diet and regular physical activity with a goal BMI less than 30

## 2023-12-26 NOTE — Assessment & Plan Note (Signed)
 Will recheck levels today to see if this has resolved

## 2023-12-26 NOTE — Assessment & Plan Note (Signed)
 Blood pressure is fairly controlled, but has been increasing slowly, he is to restart his coreg.

## 2024-01-10 DIAGNOSIS — G4733 Obstructive sleep apnea (adult) (pediatric): Secondary | ICD-10-CM | POA: Diagnosis not present

## 2024-01-19 ENCOUNTER — Ambulatory Visit: Payer: Medicare PPO | Admitting: Nurse Practitioner

## 2024-01-19 ENCOUNTER — Encounter: Payer: Self-pay | Admitting: Nurse Practitioner

## 2024-01-25 ENCOUNTER — Ambulatory Visit (HOSPITAL_BASED_OUTPATIENT_CLINIC_OR_DEPARTMENT_OTHER): Payer: Medicare PPO | Admitting: Internal Medicine

## 2024-01-25 VITALS — BP 132/72 | HR 77 | Ht 70.0 in | Wt 209.9 lb

## 2024-01-25 DIAGNOSIS — E785 Hyperlipidemia, unspecified: Secondary | ICD-10-CM

## 2024-01-25 DIAGNOSIS — R931 Abnormal findings on diagnostic imaging of heart and coronary circulation: Secondary | ICD-10-CM | POA: Diagnosis not present

## 2024-01-25 DIAGNOSIS — I10 Essential (primary) hypertension: Secondary | ICD-10-CM | POA: Diagnosis not present

## 2024-01-25 MED ORDER — EZETIMIBE 10 MG PO TABS: 10.0000 mg | ORAL_TABLET | Freq: Every day | ORAL | 3 refills | Status: DC

## 2024-01-25 MED ORDER — CARVEDILOL 3.125 MG PO TABS: 3.1250 mg | ORAL_TABLET | Freq: Two times a day (BID) | ORAL | 3 refills | Status: AC

## 2024-01-25 NOTE — Patient Instructions (Signed)
 Medication Instructions:  Your physician recommends that you continue on your current medications as directed. Please refer to the Current Medication list given to you today.  *If you need a refill on your cardiac medications before your next appointment, please call your pharmacy*  Lab Work: FASTING Lipid Panel If you have labs (blood work) drawn today and your tests are completely normal, you will receive your results only by: MyChart Message (if you have MyChart) OR A paper copy in the mail If you have any lab test that is abnormal or we need to change your treatment, we will call you to review the results.  Follow-Up: At [facility], you and your health needs are our priority.  As part of our continuing mission to provide you with exceptional heart care, our providers are all part of one team.  This team includes your primary Cardiologist (physician) and Advanced Practice Providers or APPs (Physician Assistants and Nurse Practitioners) who all work together to provide you with the care you need, when you need it.  Your next appointment:   12 month(s)  Provider:   Slater Duncan, NP - Lipid Clinic

## 2024-01-25 NOTE — Progress Notes (Signed)
 LIPID CLINIC CONSULT NOTE  Chief Complaint:  Follow-up dyslipidemia  Primary Care Physician: Susanna Epley, FNP  Primary Cardiologist:  None  HPI:  Stephen Chase is a 58 y.o. male who is being seen today for the evaluation of dyslipidemia at the request of Susanna Epley, FNP.  This is a pleasant 58 year old male kindly referred for evaluation management of dyslipidemia.  He is seen today with his partner and a professional sign language interpreter.  He is referred for evaluation management of dyslipidemia, specifically elevated triglycerides.  His last lipid profile showed a total cholesterol of 223, triglycerides 748, HDL 24 and LDL 79.  He has had intermittent high triglycerides in the past.  Therapy for this includes atorvastatin  40 mg nightly, fenofibrate  on 45 mg daily and intermittently he takes fish oil.  He was prescribed Vascepa  at 1 point however he said they had not filled that medication.  Of note he has a past smoking history of about 30 pack years.  He quit 8 years ago.  He had a CT scan for lung cancer screening in March of this year, which showed no evidence of mass or tumor, however there were findings consistent with COPD, hepatic steatosis, aortic atherosclerosis, and left main and three-vessel coronary artery calcification.  I did explain these findings to him today indicating that he does have coronary artery disease.  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  He is not currently on any inhalers or treatments for COPD.  He specifically denied any chest pain.  09/29/2023  Mr. Eyer is seen today in follow-up with a sign language interpreter.  He had recent repeat lipid testing which shows some mild improvement in triglycerides.  Total cholesterol 181, triglycerides 443, HDL 25 and LDL 89.  He had recent imaging which unfortunately showed renal cancer.  He  is scheduled for surgical resection of this next month.  From a cardiac standpoint he should be okay to proceed with that procedure.  He did have a PET CT scan which showed mild reversible ischemia in the RCA distribution however he has had no anginal symptoms.  I started him on aspirin and working to intensify his lipids.  His goal LDL should be less than 70 and this still remains a target of therapy.  He has also had some low blood pressure issues.  He reports intermittent dizziness.  Today blood pressure was 98/50.  01/25/2024  Mr. Lugg is seen today in follow-up.  He has subsequently undergone resection of his kidney for kidney cancer which ultimately was successful although he had some postoperative bleeding and anemia.  Overall he continues to feel better.  His cholesterol is also better controlled.  Total now 161, triglycerides 409, HDL 28 and LDL 69.  We did provide some dietary information to lower triglycerides today.  He has had no anginal symptoms despite having mildly abnormal PET/CT prior to surgery.  He also reports less dizziness.  He was taken off of home losartan and I reduced his carvedilol  and his blood pressures are still reasonably well-controlled.  PMHx:  Past Medical History:  Diagnosis Date   Allergy    Arthritis    SHOULDERS   Balanitis    Chronic back pain    CKD (chronic kidney disease)    Deafness, sensorineural, childhood onset    BILATERAL SINCE AGE 18  SECONDARY TO UNKNOWN  ILLNESS   Esophageal dysphagia 11/11/2022   Esophageal varices without bleeding (HCC) 05/09/2018   GERD (gastroesophageal reflux disease)    Hyperlipidemia    Hypertension    Lumbar radiculopathy 02/18/2023   Lumbar spondylosis 05/07/2023   No blood products    Patient is Jehovah Witness no blood products.   OSA on CPAP    SEVERE PER STUDY 03-09-2009   Right renal mass    Smokers' cough (HCC)    Type 2 diabetes mellitus (HCC)     Past Surgical History:  Procedure Laterality Date    CIRCUMCISION N/A 12/19/2013   Procedure: CIRCUMCISION ADULT;  Surgeon: Jock Muller, MD;  Location: [facility];  Service: Urology;  Laterality: N/A;   ROBOTIC ASSITED PARTIAL NEPHRECTOMY Right 10/20/2023   Procedure: XI ROBOTIC ASSITED RIGHT PARTIAL NEPHRECTOMY;  Surgeon: Adelbert Homans, MD;  Location: [facility];  Service: Urology;  Laterality: Right;   SHOULDER SURGERY Right    SHOULDER SURGERY Left 10/15/2021   WRIST SURGERY Left     FAMHx:  Family History  Problem Relation Age of Onset   Cancer Mother        type unknown pt was 60 years old at that time   Liver disease Father    Healthy Brother    Cancer Other         alot of family members have cancer but doesnt know the type   Colon cancer Neg Hx    Esophageal cancer Neg Hx    Rectal cancer Neg Hx    Stomach cancer Neg Hx    Sleep apnea Neg Hx     SOCHx:   reports that he has quit smoking. His smoking use included cigarettes. He has a 30 pack-year smoking history. He has never used smokeless tobacco. He reports that he does not currently use alcohol. He reports that he does not currently use drugs.  ALLERGIES:  Allergies  Allergen Reactions   Other     Blood Refusal     ROS: Pertinent items noted in HPI and remainder of comprehensive ROS otherwise negative.  HOME MEDS: Current Outpatient Medications on File Prior to Visit  Medication Sig Dispense Refill   Accu-Chek Softclix Lancets lancets Use as instructed to test blood sugars 3 times a day dx code e11.65 200 each 3   atorvastatin  (LIPITOR) 40 MG tablet TAKE 1 TABLET(40 MG) BY MOUTH AT BEDTIME     Blood Glucose Monitoring Suppl (ACCU-CHEK GUIDE) w/Device KIT Use to check blood sugars three times daily E11.9 1 kit 0   Dapagliflozin  Propanediol (FARXIGA  PO) Take 10 mg by mouth daily.     docusate sodium  (COLACE) 100 MG capsule Take 1 capsule (100 mg total) by mouth 2 (two) times daily.     fenofibrate  (TRICOR ) 145 MG tablet TAKE 1 TABLET(145 MG)  BY MOUTH DAILY 90 tablet 1   ferrous sulfate  325 (65 FE) MG EC tablet Take 1 tablet (325 mg total) by mouth daily with breakfast. 90 tablet 0   glucose blood (ACCU-CHEK GUIDE) test strip CHECK BLOOD SUGAR 3 TIMES A DAY BEFORE BREAKFAST, LUNCH, AND DINNER 150 strip 2   glucose blood test strip Use as instructed to test blood sugars 3 times a day dx code e11.65 100 each 12   HYDROcodone -acetaminophen  (NORCO) 5-325 MG tablet Take 1-2 tablets by mouth every 6 (six) hours as needed for moderate pain (pain score 4-6) or severe pain (pain score 7-10). 20 tablet 0   icosapent  Ethyl (VASCEPA ) 1  g capsule One bid for elevated triglycerides 180 capsule 3   modafinil  (PROVIGIL ) 200 MG tablet TAKE 1 TABLET(200 MG) BY MOUTH DAILY 30 tablet 5   omeprazole  (PRILOSEC) 10 MG capsule TAKE 1 CAPSULE(10 MG) BY MOUTH DAILY 90 capsule 2   tamsulosin  (FLOMAX ) 0.4 MG CAPS capsule TAKE 1 CAPSULE BY MOUTH EVERY DAY 30 MINUTES AFTER THE SAME MEAL 90 capsule 3   carvedilol  (COREG ) 3.125 MG tablet Take 1 tablet (3.125 mg total) by mouth 2 (two) times daily. (Patient not taking: Reported on 11/08/2023) 180 tablet 3   ezetimibe  (ZETIA ) 10 MG tablet Take 1 tablet (10 mg total) by mouth daily. 90 tablet 3   UNABLE TO FIND CPAP for OSA (6cm-19cm)     No current facility-administered medications on file prior to visit.    LABS/IMAGING: No results found for this or any previous visit (from the past 48 hours). No results found.  LIPID PANEL:    Component Value Date/Time   CHOL 161 12/14/2023 0959   TRIG 409 (H) 12/14/2023 0959   HDL 28 (L) 12/14/2023 0959   CHOLHDL 5.8 (H) 12/14/2023 0959   LDLCALC 69 12/14/2023 0959   LDLDIRECT 89 09/24/2023 0821    WEIGHTS: Wt Readings from Last 3 Encounters:  01/25/24 209 lb 14.4 oz (95.2 kg)  12/14/23 214 lb (97.1 kg)  11/08/23 214 lb 3.2 oz (97.2 kg)    VITALS: BP 132/72 (Patient Position: Sitting, Cuff Size: Normal)   Pulse 77   Ht 5\' 10"  (1.778 m)   Wt 209 lb 14.4 oz (95.2  kg)   SpO2 98%   BMI 30.12 kg/m   EXAM: Deferred  EKG: Deferred  ASSESSMENT: Mixed dyslipidemia with high triglycerides Progressive dyspnea on exertion and fatigue Left main and multivessel coronary artery calcification Aortic atherosclerosis 30-pack-year smoking history COPD Hearing impaired Abnormal cardiac PET showing mild RCA distribution reversible ischemia (05/2023)  PLAN: 1.   Mr. Klare has achieved a target LDL less than 70 and has triglycerides less than 500.  He will continue to work with diet and lifestyle on this.  Blood pressure is reasonably well-controlled off of telmisartan which I would continue to stay off of but he should stay on low-dose carvedilol .  He is having no anginal symptoms.  Because of his abnormal PET scan he should have follow-up with us  and also management of his blood pressure and lipids.  Plan follow-up with our lipid APP in 1 year.  Hazle Lites, MD, [facility], FNLA, FACP  [place]  [facility]  Medical Director of the [facility] Lipid [facility] &  Cardiovascular Risk Reduction Clinic Diplomate of the American Board of Clinical Lipidology Attending Cardiologist  Direct Dial: [phone]  Fax: [phone]  Website:  [email or web address]   Aviva Lemmings Dyllin Gulley 01/25/2024, 9:20 AM

## 2024-01-31 NOTE — Telephone Encounter (Unsigned)
 Copied from CRM [id number]. Topic: Clinical - Medication Refill >> Jan 31, 2024  3:24 PM Lotus Round B wrote: Medication: ferrous sulfate  325 (65 FE) MG EC tablet  Has the patient contacted their pharmacy? Yes (Agent: If no, request that the patient contact the pharmacy for the refill. If patient does not wish to contact the pharmacy document the reason why and proceed with request.) (Agent: If yes, when and what did the pharmacy advise?)  This is the patient's preferred pharmacy:  [facility] [phone] - [place], [place] - [address] AT [facility] [address] [place] [address] Phone: [phone] Fax: [phone]   Is this the correct pharmacy for this prescription? Yes If no, delete pharmacy and type the correct one.   Has the prescription been filled recently? No  Is the patient out of the medication? Yes  Has the patient been seen for an appointment in the last year OR does the patient have an upcoming appointment? Yes  Can we respond through MyChart? Yes  Agent: Please be advised that Rx refills may take up to 3 business days. We ask that you follow-up with your pharmacy.

## 2024-01-31 NOTE — Telephone Encounter (Unsigned)
 Copied from CRM [id number]. Topic: Clinical - Medication Refill >> Jan 31, 2024  3:17 PM Lotus Round B wrote: Medication: ferrous sulfate  325 (65 FE) MG EC tablet  Has the patient contacted their pharmacy? Yes (Agent: If no, request that the patient contact the pharmacy for the refill. If patient does not wish to contact the pharmacy document the reason why and proceed with request.) (Agent: If yes, when and what did the pharmacy advise?)  This is the patient's preferred pharmacy:  [facility] [phone] - [place], [place] - [address] AT [facility] [address] [place] [address] Phone: [phone] Fax: [phone]   Is this the correct pharmacy for this prescription? Yes If no, delete pharmacy and type the correct one.   Has the prescription been filled recently? No  Is the patient out of the medication? Yes  Has the patient been seen for an appointment in the last year OR does the patient have an upcoming appointment? Yes  Can we respond through MyChart? Yes  Agent: Please be advised that Rx refills may take up to 3 business days. We ask that you follow-up with your pharmacy.

## 2024-02-02 ENCOUNTER — Other Ambulatory Visit: Payer: Self-pay

## 2024-02-02 MED ORDER — FERROUS SULFATE 325 (65 FE) MG PO TBEC: 325.0000 mg | DELAYED_RELEASE_TABLET | Freq: Every day | ORAL | 0 refills | Status: AC

## 2024-02-03 DIAGNOSIS — C641 Malignant neoplasm of right kidney, except renal pelvis: Secondary | ICD-10-CM | POA: Diagnosis not present

## 2024-02-09 DIAGNOSIS — G4733 Obstructive sleep apnea (adult) (pediatric): Secondary | ICD-10-CM | POA: Diagnosis not present

## 2024-02-10 ENCOUNTER — Telehealth: Payer: Self-pay | Admitting: Nurse Practitioner

## 2024-02-10 NOTE — Telephone Encounter (Signed)
 PICKED UP OZEMPIC  2MG  1X3ML QTY 4 02/02/24

## 2024-02-17 ENCOUNTER — Other Ambulatory Visit: Payer: Self-pay

## 2024-02-17 DIAGNOSIS — E785 Hyperlipidemia, unspecified: Secondary | ICD-10-CM

## 2024-02-17 MED ORDER — ATORVASTATIN CALCIUM 40 MG PO TABS: ORAL_TABLET | ORAL | 1 refills | Status: DC

## 2024-02-18 ENCOUNTER — Other Ambulatory Visit (HOSPITAL_COMMUNITY): Payer: Self-pay | Admitting: Nurse Practitioner

## 2024-02-18 DIAGNOSIS — C641 Malignant neoplasm of right kidney, except renal pelvis: Secondary | ICD-10-CM

## 2024-02-21 ENCOUNTER — Ambulatory Visit (HOSPITAL_COMMUNITY)
Admission: RE | Admit: 2024-02-21 | Discharge: 2024-02-21 | Disposition: A | Discharge status: Home / Self Care | Source: Ambulatory Visit | Attending: Nurse Practitioner | Admitting: Nurse Practitioner

## 2024-02-21 DIAGNOSIS — K5730 Diverticulosis of large intestine without perforation or abscess without bleeding: Secondary | ICD-10-CM | POA: Diagnosis not present

## 2024-02-21 DIAGNOSIS — M545 Low back pain, unspecified: Secondary | ICD-10-CM

## 2024-02-21 DIAGNOSIS — C641 Malignant neoplasm of right kidney, except renal pelvis: Secondary | ICD-10-CM

## 2024-02-21 DIAGNOSIS — N3289 Other specified disorders of bladder: Secondary | ICD-10-CM | POA: Diagnosis not present

## 2024-02-21 DIAGNOSIS — Z905 Acquired absence of kidney: Secondary | ICD-10-CM | POA: Diagnosis not present

## 2024-02-21 LAB — POCT I-STAT CREATININE: Creatinine, Ser: 1.3 mg/dL — ABNORMAL HIGH (ref 0.61–1.24)

## 2024-02-21 MED ORDER — IOHEXOL 300 MG/ML  SOLN
100.0000 mL | Freq: Once | INTRAMUSCULAR | Status: AC | PRN
  Administered 2024-02-21: 100 mL via INTRAVENOUS

## 2024-02-21 MED ORDER — SODIUM CHLORIDE (PF) 0.9 % IJ SOLN
INTRAMUSCULAR | Status: AC
  Filled 2024-02-21: qty 50

## 2024-02-22 ENCOUNTER — Ambulatory Visit: Payer: Self-pay | Admitting: Nurse Practitioner

## 2024-02-22 ENCOUNTER — Encounter: Payer: Self-pay | Admitting: Nurse Practitioner

## 2024-02-24 DIAGNOSIS — R3915 Urgency of urination: Secondary | ICD-10-CM | POA: Diagnosis not present

## 2024-02-24 DIAGNOSIS — C641 Malignant neoplasm of right kidney, except renal pelvis: Secondary | ICD-10-CM | POA: Diagnosis not present

## 2024-02-28 DIAGNOSIS — M5416 Radiculopathy, lumbar region: Secondary | ICD-10-CM | POA: Diagnosis not present

## 2024-03-06 ENCOUNTER — Telehealth: Payer: Self-pay | Admitting: Pharmacist

## 2024-03-06 DIAGNOSIS — E1169 Type 2 diabetes mellitus with other specified complication: Secondary | ICD-10-CM

## 2024-03-06 MED ORDER — DAPAGLIFLOZIN PROPANEDIOL 10 MG PO TABS: 10.0000 mg | ORAL_TABLET | Freq: Every day | ORAL | 3 refills | Status: AC

## 2024-03-06 NOTE — Telephone Encounter (Signed)
-----   Message from [place] sent at 03/06/2024  8:33 AM EDT ----- Good morning,  [employer] is requesting an active Rx be sent in to [facility] to continue processing any orders for Farxiga .  Thanks! Notice indexed to chart.

## 2024-03-06 NOTE — Progress Notes (Signed)
   03/06/2024  Patient ID: Stephen Chase, male   DOB: August 22, 1966, 58 y.o.   MRN: [id number]  Patient is getting Farxiga  through [employer] Patient Assistance Program.  Received notification that a new prescription/refill is necessary for the Patient to continue to receive Farxiga  through the program.    HgA1c from January 7.1% upcoming appt with PCP in August needs new labs On Atorvastatin  last filled 12/02/23 for a 90 day supply LDL-69 03/25   Geronimo Krabbe, PharmD, BCACP Clinical Pharmacist [phone]

## 2024-03-11 DIAGNOSIS — G4733 Obstructive sleep apnea (adult) (pediatric): Secondary | ICD-10-CM | POA: Diagnosis not present

## 2024-03-14 ENCOUNTER — Encounter: Payer: Self-pay | Admitting: Nurse Practitioner

## 2024-03-14 ENCOUNTER — Other Ambulatory Visit: Payer: Self-pay

## 2024-03-14 DIAGNOSIS — E1169 Type 2 diabetes mellitus with other specified complication: Secondary | ICD-10-CM

## 2024-03-14 DIAGNOSIS — G4733 Obstructive sleep apnea (adult) (pediatric): Secondary | ICD-10-CM | POA: Diagnosis not present

## 2024-03-14 MED ORDER — GLUCOSE BLOOD VI STRP: ORAL_STRIP | 12 refills | Status: AC

## 2024-03-17 ENCOUNTER — Other Ambulatory Visit: Payer: Self-pay

## 2024-03-17 ENCOUNTER — Encounter: Payer: Self-pay | Admitting: Nurse Practitioner

## 2024-03-17 MED ORDER — ACCU-CHEK GUIDE TEST VI STRP: ORAL_STRIP | 3 refills | Status: AC

## 2024-03-21 DIAGNOSIS — M5416 Radiculopathy, lumbar region: Secondary | ICD-10-CM | POA: Diagnosis not present

## 2024-04-10 DIAGNOSIS — G4733 Obstructive sleep apnea (adult) (pediatric): Secondary | ICD-10-CM | POA: Diagnosis not present

## 2024-04-19 DIAGNOSIS — M47816 Spondylosis without myelopathy or radiculopathy, lumbar region: Secondary | ICD-10-CM | POA: Diagnosis not present

## 2024-04-21 ENCOUNTER — Other Ambulatory Visit: Payer: Self-pay | Admitting: Nurse Practitioner

## 2024-04-26 ENCOUNTER — Encounter: Payer: Self-pay | Admitting: Nurse Practitioner

## 2024-04-26 ENCOUNTER — Other Ambulatory Visit: Payer: Self-pay

## 2024-04-26 MED ORDER — FENOFIBRATE 145 MG PO TABS: 145.0000 mg | ORAL_TABLET | Freq: Every day | ORAL | 3 refills | Status: AC

## 2024-05-10 ENCOUNTER — Encounter: Payer: Self-pay | Admitting: Family Medicine

## 2024-05-10 ENCOUNTER — Ambulatory Visit: Admitting: Family Medicine

## 2024-05-10 ENCOUNTER — Ambulatory Visit (INDEPENDENT_AMBULATORY_CARE_PROVIDER_SITE_OTHER)

## 2024-05-10 VITALS — BP 130/60 | HR 74 | Temp 98.5°F | Ht 70.0 in | Wt 214.0 lb

## 2024-05-10 DIAGNOSIS — I10 Essential (primary) hypertension: Secondary | ICD-10-CM | POA: Diagnosis not present

## 2024-05-10 DIAGNOSIS — E1122 Type 2 diabetes mellitus with diabetic chronic kidney disease: Secondary | ICD-10-CM | POA: Diagnosis not present

## 2024-05-10 DIAGNOSIS — E781 Pure hyperglyceridemia: Secondary | ICD-10-CM

## 2024-05-10 DIAGNOSIS — E1142 Type 2 diabetes mellitus with diabetic polyneuropathy: Secondary | ICD-10-CM | POA: Diagnosis not present

## 2024-05-10 DIAGNOSIS — Z85528 Personal history of other malignant neoplasm of kidney: Secondary | ICD-10-CM

## 2024-05-10 DIAGNOSIS — N1831 Chronic kidney disease, stage 3a: Secondary | ICD-10-CM

## 2024-05-10 DIAGNOSIS — Z0000 Encounter for general adult medical examination without abnormal findings: Secondary | ICD-10-CM | POA: Diagnosis not present

## 2024-05-10 DIAGNOSIS — Z760 Encounter for issue of repeat prescription: Secondary | ICD-10-CM

## 2024-05-10 DIAGNOSIS — N183 Chronic kidney disease, stage 3 unspecified: Secondary | ICD-10-CM

## 2024-05-10 DIAGNOSIS — E785 Hyperlipidemia, unspecified: Secondary | ICD-10-CM

## 2024-05-10 DIAGNOSIS — I129 Hypertensive chronic kidney disease with stage 1 through stage 4 chronic kidney disease, or unspecified chronic kidney disease: Secondary | ICD-10-CM

## 2024-05-10 DIAGNOSIS — I8500 Esophageal varices without bleeding: Secondary | ICD-10-CM

## 2024-05-10 DIAGNOSIS — E1169 Type 2 diabetes mellitus with other specified complication: Secondary | ICD-10-CM | POA: Diagnosis not present

## 2024-05-10 DIAGNOSIS — Z79899 Other long term (current) drug therapy: Secondary | ICD-10-CM

## 2024-05-10 MED ORDER — OMEPRAZOLE 10 MG PO CPDR: DELAYED_RELEASE_CAPSULE | ORAL | 2 refills | Status: DC

## 2024-05-10 MED ORDER — TAMSULOSIN HCL 0.4 MG PO CAPS: ORAL_CAPSULE | ORAL | 3 refills | Status: AC

## 2024-05-10 NOTE — Progress Notes (Signed)
 Subjective:   Stephen Chase is a 58 y.o. who presents for a Medicare Wellness preventive visit.  As a reminder, Annual Wellness Visits don't include a physical exam, and some assessments may be limited, especially if this visit is performed virtually. We may recommend an in-person follow-up visit with your provider if needed.  Visit Complete: Virtual I connected with  Stephen Chase on 05/10/24 by a audio enabled telemedicine application and verified that I am speaking with the correct person using two identifiers.  Patient Location: Home  Provider Location: Office/Clinic  I discussed the limitations of evaluation and management by telemedicine. The patient expressed understanding and agreed to proceed.  Vital Signs: Because this visit was a virtual/telehealth visit, some criteria may be missing or patient reported. Any vitals not documented were not able to be obtained and vitals that have been documented are patient reported.  VideoError- Librarian, academic were attempted between this provider and patient, however failed, due to patient having technical difficulties OR patient did not have access to video capability.  We continued and completed visit with audio only.   Persons Participating in Visit: Patient assisted by interpreter.  AWV Questionnaire: No: Patient Medicare AWV questionnaire was not completed prior to this visit.  Cardiac Risk Factors include: diabetes mellitus;male gender     Objective:    Today's Vitals   05/10/24 1619  PainSc: 5    There is no height or weight on file to calculate BMI.     05/10/2024    4:32 PM 11/02/2023    6:00 PM 10/20/2023    6:04 AM 08/31/2023   11:18 AM 05/06/2023   10:31 AM 04/23/2022    8:48 AM 10/28/2021    2:46 PM  Advanced Directives  Does Patient Have a Medical Advance Directive? Yes Yes Yes Yes Yes Yes No  Type of Estate agent of [place];Living will Healthcare Power of  [place];Living will Healthcare Power of [place];Living will Healthcare Power of [place];Living will Healthcare Power of [place];Living will Healthcare Power of [place];Living will   Does patient want to make changes to medical advance directive?  No - Patient declined No - Patient declined      Copy of Healthcare Power of Attorney in Chart?  Yes - validated most recent copy scanned in chart (See row information) No - copy requested  Yes - validated most recent copy scanned in chart (See row information) Yes - validated most recent copy scanned in chart (See row information)   Would patient like information on creating a medical advance directive?       No - Patient declined    Current Medications (verified) Outpatient Encounter Medications as of 05/10/2024  Medication Sig   Accu-Chek Softclix Lancets lancets Use as instructed to test blood sugars 3 times a day dx code e11.65   atorvastatin  (LIPITOR) 40 MG tablet TAKE 1 TABLET(40 MG) BY MOUTH AT BEDTIME   Blood Glucose Monitoring Suppl (ACCU-CHEK GUIDE) w/Device KIT Use to check blood sugars three times daily E11.9   carvedilol  (COREG ) 3.125 MG tablet Take 1 tablet (3.125 mg total) by mouth 2 (two) times daily.   dapagliflozin  propanediol (FARXIGA ) 10 MG TABS tablet Take 1 tablet (10 mg total) by mouth daily.   docusate sodium  (COLACE) 100 MG capsule Take 1 capsule (100 mg total) by mouth 2 (two) times daily.   ezetimibe  (ZETIA ) 10 MG tablet Take 1 tablet (10 mg total) by mouth daily.   fenofibrate  (TRICOR ) 145 MG tablet Take  1 tablet (145 mg total) by mouth daily.   ferrous sulfate  325 (65 FE) MG EC tablet Take 1 tablet (325 mg total) by mouth daily with breakfast.   glucose blood (ACCU-CHEK GUIDE TEST) test strip Use as directed to check blood sugars 3 times daily before meals E11.69   glucose blood test strip Use as instructed   HYDROcodone -acetaminophen  (NORCO) 5-325 MG tablet Take 1-2 tablets by mouth every 6 (six) hours as needed for  moderate pain (pain score 4-6) or severe pain (pain score 7-10).   icosapent  Ethyl (VASCEPA ) 1 g capsule One bid for elevated triglycerides   modafinil  (PROVIGIL ) 200 MG tablet TAKE 1 TABLET(200 MG) BY MOUTH DAILY   omeprazole  (PRILOSEC) 10 MG capsule TAKE 1 CAPSULE(10 MG) BY MOUTH DAILY   tamsulosin  (FLOMAX ) 0.4 MG CAPS capsule TAKE 1 CAPSULE BY MOUTH EVERY DAY 30 MINUTES AFTER THE SAME MEAL   UNABLE TO FIND CPAP for OSA (6cm-19cm)   No facility-administered encounter medications on file as of 05/10/2024.    Allergies (verified) Other   History: Past Medical History:  Diagnosis Date   Allergy    Arthritis    SHOULDERS   Balanitis    Chronic back pain    CKD (chronic kidney disease)    Deafness, sensorineural, childhood onset    BILATERAL SINCE AGE 26  SECONDARY TO UNKNOWN ILLNESS   Esophageal dysphagia 11/11/2022   Esophageal varices without bleeding (HCC) 05/09/2018   GERD (gastroesophageal reflux disease)    Hyperlipidemia    Hypertension    Lumbar radiculopathy 02/18/2023   Lumbar spondylosis 05/07/2023   No blood products    Patient is Jehovah Witness no blood products.   OSA on CPAP    SEVERE PER STUDY 03-09-2009   Right renal mass    Smokers' cough (HCC)    Type 2 diabetes mellitus (HCC)    Past Surgical History:  Procedure Laterality Date   CIRCUMCISION N/A 12/19/2013   Procedure: CIRCUMCISION ADULT;  Surgeon: Oliva Oiler, MD;  Location: [facility];  Service: Urology;  Laterality: N/A;   ROBOTIC ASSITED PARTIAL NEPHRECTOMY Right 10/20/2023   Procedure: XI ROBOTIC ASSITED RIGHT PARTIAL NEPHRECTOMY;  Surgeon: Devere Lonni Righter, MD;  Location: [facility];  Service: Urology;  Laterality: Right;   SHOULDER SURGERY Right    SHOULDER SURGERY Left 10/15/2021   WRIST SURGERY Left    Family History  Problem Relation Age of Onset   Cancer Mother        type unknown pt was 73 years old at that time   Liver disease Father    Healthy Brother    Cancer  Other         alot of family members have cancer but doesnt know the type   Colon cancer Neg Hx    Esophageal cancer Neg Hx    Rectal cancer Neg Hx    Stomach cancer Neg Hx    Sleep apnea Neg Hx    Social History   Socioeconomic History   Marital status: Married    Spouse name: Not on file   Number of children: 4   Years of education: Not on file   Highest education level: Not on file  Occupational History   Occupation: unemployed  Tobacco Use   Smoking status: Former    Current packs/day: 1.00    Average packs/day: 1 pack/day for 30.0 years (30.0 ttl pk-yrs)    Types: Cigarettes   Smokeless tobacco: Never   Tobacco comments:  quit 4 years ago  Vaping Use   Vaping status: Never Used  Substance and Sexual Activity   Alcohol use: Not Currently    Comment: rarely   Drug use: Not Currently   Sexual activity: Yes  Other Topics Concern   Not on file  Social History Narrative   Not on file   Social Drivers of Health   Financial Resource Strain: Low Risk  (05/10/2024)   Overall Financial Resource Strain (CARDIA)    Difficulty of Paying Living Expenses: Not hard at all  Food Insecurity: No Food Insecurity (05/10/2024)   Hunger Vital Sign    Worried About Running Out of Food in the Last Year: Never true    Ran Out of Food in the Last Year: Never true  Transportation Needs: No Transportation Needs (05/10/2024)   PRAPARE - Administrator, Civil Service (Medical): No    Lack of Transportation (Non-Medical): No  Physical Activity: Insufficiently Active (05/10/2024)   Exercise Vital Sign    Days of Exercise per Week: 5 days    Minutes of Exercise per Session: 20 min  Stress: No Stress Concern Present (05/10/2024)   [employer] of Occupational Health - Occupational Stress Questionnaire    Feeling of Stress: Not at all  Social Connections: Moderately Integrated (05/10/2024)   Social Connection and Isolation Panel    Frequency of Communication with Friends and  Family: More than three times a week    Frequency of Social Gatherings with Friends and Family: Three times a week    Attends Religious Services: More than 4 times per year    Active Member of Clubs or Organizations: No    Attends Banker Meetings: Never    Marital Status: Married    Tobacco Counseling Counseling given: Not Answered Tobacco comments: quit 4 years ago    Clinical Intake:  Pre-visit preparation completed: Yes  Pain : 0-10 Pain Score: 5  Pain Type: Chronic pain Pain Location: Back Pain Orientation: Lower Pain Descriptors / Indicators: Aching Pain Onset: More than a month ago Pain Frequency: Constant     Nutritional Risks: None Diabetes: Yes CBG done?: No Did pt. bring in CBG monitor from home?: No  Lab Results  Component Value Date   HGBA1C 7.1 (H) 10/14/2023   HGBA1C 7.2 (H) 08/31/2023   HGBA1C 8.5 (H) 05/06/2023     How often do you need to have someone help you when you read instructions, pamphlets, or other written materials from your doctor or pharmacy?: 1 - Never  Interpreter Needed?: Yes Interpreter Name: through telephone  Information entered by :: NAllen LPN   Activities of Daily Living     05/10/2024    4:23 PM 11/02/2023    6:00 PM  In your present state of health, do you have any difficulty performing the following activities:  Hearing? 1 1  Comment deaf   Vision? 0 0  Difficulty concentrating or making decisions? 0 0  Walking or climbing stairs? 0   Dressing or bathing? 0   Doing errands, shopping? 0 0  Preparing Food and eating ? N   Using the Toilet? N   In the past six months, have you accidently leaked urine? N   Do you have problems with loss of bowel control? N   Managing your Medications? N   Managing your Finances? N   Housekeeping or managing your Housekeeping? N     Patient Care Team: Georgina Speaks, FNP as PCP - General (General  Practice) Rudy Dorothyann DASEN, RPH-CPP (Pharmacist)  I have updated  your Care Teams any recent Medical Services you may have received from other providers in the past year.     Assessment:   This is a routine wellness examination for Jarrah.  Hearing/Vision screen Hearing Screening - Comments:: deaf Vision Screening - Comments:: Regular eye exams, [facility]   Goals Addressed             This Visit's Progress    Patient Stated       05/10/2024, eating more healthy       Depression Screen     05/10/2024    4:36 PM 12/14/2023    9:02 AM 05/06/2023   10:35 AM 06/17/2022   10:08 AM 04/23/2022    8:53 AM 04/15/2022   10:44 AM 04/17/2021    9:29 AM  PHQ 2/9 Scores  PHQ - 2 Score 1 0 0 0 0 0 0  PHQ- 9 Score  0         Fall Risk     05/10/2024    4:33 PM 12/14/2023    9:02 AM 05/06/2023   10:34 AM 06/17/2022   10:08 AM 04/23/2022    8:50 AM  Fall Risk   Falls in the past year? 0 0 1 1 1   Comment   tripped  slipped taking the dog out  Number falls in past yr: 0 0 0 0 0  Injury with Fall? 0 0 0 0 0  Risk for fall due to : Medication side effect No Fall Risks Medication side effect No Fall Risks Medication side effect  Follow up Falls evaluation completed;Falls prevention discussed Falls evaluation completed Falls prevention discussed;Falls evaluation completed Falls evaluation completed  Falls evaluation completed;Education provided;Falls prevention discussed      Data saved with a previous flowsheet row definition    MEDICARE RISK AT HOME:  Medicare Risk at Home Any stairs in or around the home?: Yes If so, are there any without handrails?: No Home free of loose throw rugs in walkways, pet beds, electrical cords, etc?: Yes Adequate lighting in your home to reduce risk of falls?: Yes Life alert?: No Use of a cane, walker or w/c?: No Grab bars in the bathroom?: Yes Shower chair or bench in shower?: No Elevated toilet seat or a handicapped toilet?: No  TIMED UP AND GO:  Was the test performed?  No  Cognitive Function: Unable: Due to  language barrier, hearing or vision limitations or other patient is deaf and has an interpreter over the phone. Appears cognitive via conversation through interpreter.        Immunizations Immunization History  Administered Date(s) Administered   Hepatitis B, ADULT 03/03/2017   Hepb-cpg 07/13/2018, 08/17/2018, 05/12/2019   Influenza,inj,Quad PF,6+ Mos 07/13/2018, 05/31/2019, 07/17/2020, 08/11/2021, 06/17/2022   Janssen (J&J) SARS-COV-2 Vaccination 12/28/2019   Moderna Covid-19 Vaccine Bivalent Booster 16yrs & up 09/16/2021   Moderna Sars-Covid-2 Vaccination 08/06/2020, 04/25/2021   PNEUMOCOCCAL CONJUGATE-20 10/21/2023   Pfizer(Comirnaty)Fall Seasonal Vaccine 12 years and older 12/08/2022, 12/14/2023   Pneumococcal Polysaccharide-23 08/11/2021   Tdap 05/06/2023   Zoster Recombinant(Shingrix ) 10/26/2022, 05/21/2023    Screening Tests Health Maintenance  Topic Date Due   Colonoscopy  02/04/2024   Diabetic kidney evaluation - Urine ACR  05/05/2024   HEMOGLOBIN A1C  04/12/2024   INFLUENZA VACCINE  12/19/2024 (Originally 04/21/2024)   OPHTHALMOLOGY EXAM  05/17/2024   COVID-19 Vaccine (7 - 2024-25 season) 06/15/2024   Diabetic kidney evaluation - eGFR measurement  12/13/2024   Lung Cancer Screening  12/20/2024   FOOT EXAM  05/10/2025   Medicare Annual Wellness (AWV)  05/10/2025   DTaP/Tdap/Td (2 - Td or Tdap) 05/05/2033   Pneumococcal Vaccine: 50+ Years  Completed   Hepatitis B Vaccines 19-59 Average Risk  Completed   Hepatitis C Screening  Completed   HIV Screening  Completed   Zoster Vaccines- Shingrix   Completed   HPV VACCINES  Aged Out   Meningococcal B Vaccine  Aged Out    Health Maintenance  Health Maintenance Due  Topic Date Due   Colonoscopy  02/04/2024   Diabetic kidney evaluation - Urine ACR  05/05/2024   HEMOGLOBIN A1C  04/12/2024   Health Maintenance Items Addressed: Due for colonoscopy, patient states that he will look into it  Additional  Screening:  Vision Screening: Recommended annual ophthalmology exams for early detection of glaucoma and other disorders of the eye. Would you like a referral to an eye doctor? No    Dental Screening: Recommended annual dental exams for proper oral hygiene  Community Resource Referral / Chronic Care Management: CRR required this visit?  No   CCM required this visit?  No   Plan:    I have personally reviewed and noted the following in the patient's chart:   Medical and social history Use of alcohol, tobacco or illicit drugs  Current medications and supplements including opioid prescriptions. Patient is not currently taking opioid prescriptions. Functional ability and status Nutritional status Physical activity Advanced directives List of other physicians Hospitalizations, surgeries, and ER visits in previous 12 months Vitals Screenings to include cognitive, depression, and falls Referrals and appointments  In addition, I have reviewed and discussed with patient certain preventive protocols, quality metrics, and best practice recommendations. A written personalized care plan for preventive services as well as general preventive health recommendations were provided to patient.   Ardella FORBES Dawn, LPN   [id number]   After Visit Summary: (MyChart) Due to this being a telephonic visit, the after visit summary with patients personalized plan was offered to patient via MyChart   Notes: Nothing significant to report at this time.

## 2024-05-10 NOTE — Progress Notes (Addendum)
 I,[name], CMA,acting as a Neurosurgeon for [employer], NP.,have documented all relevant documentation on the behalf of Bruna Creighton, NP,as directed by  Bruna Creighton, NP while in the presence of Bruna Creighton, NP.  Subjective:  Patient ID: Stephen Chase , male    DOB: [birth year] , 58 y.o.   MRN: [id number]  Chief Complaint  Patient presents with   Hypertension    Patient presents today for a bp and dm follow up, Patient reports compliance with medication. Patient denies any chest pain, SOB, or headaches. Patient has no concerns today.     HPI Discussed the use of AI scribe software for clinical note transcription with the patient, who gave verbal consent to proceed.  History of Present Illness      Stephen Chase is a 58 year old male who presents today for chronic disease management.  He has been experiencing significant back pain for a couple of months, for which he received an injection at [facility]. The pain has since decreased to a level of 3 out of 10.  He has a history of diabetes, currently managed with Farxiga  and Ozempic . He administers Ozempic  injections weekly on Wednesdays and regularly monitors his blood sugar levels, adhering to his medication regimen.  He has a past medical history of kidney cancer, which was surgically removed in January 2025. He reports that after the tumor was removed, he was told there was no cancer left. He takes iron supplements to improve his iron levels.  He uses a CPAP machine for sleep apnea but is dissatisfied with the newer model, preferring his previous device. He occasionally removes the CPAP during sleep.  In terms of social history, he quit smoking and drinking alcohol approximately ten years ago. He maintains regular bowel movements and denies any swelling, shortness of breath. He has a history of esophageal varices but he denies any episodes of vomiting of blood recently. He exercises regularly and performs his own nail care.  He  has had chronic kidney issues with his last creatinine at 1.30.  Mr Lloyd uses American Sign Language and Ms Richardson Phlegm, was his interpreter for this visit.  Diabetes He presents for his follow-up diabetic visit. He has type 2 diabetes mellitus. His disease course has been stable. Pertinent negatives for hypoglycemia include no dizziness. Pertinent negatives for diabetes include no chest pain, no polydipsia, no polyphagia and no polyuria. There are no hypoglycemic complications. Symptoms are stable. Diabetic complications include peripheral neuropathy. Risk factors for coronary artery disease include obesity, sedentary lifestyle, diabetes mellitus and male sex. Current diabetic treatment includes oral agent (dual therapy) and diet. His weight is stable. He is following a generally unhealthy diet. When asked about meal planning, he reported none. He has not had a previous visit with a dietitian. He participates in exercise intermittently (he is walking alot and working a strenuous job.). There is no change in his home blood glucose trend. (He has been checking his blood sugars but not sure of the readings) An ACE inhibitor/angiotensin II receptor blocker is being taken. He does not see a podiatrist.Eye exam is current.  Hypertension This is a chronic problem. The current episode started more than 1 year ago. The problem has been gradually improving since onset. The problem is controlled. Pertinent negatives include no chest pain, palpitations or shortness of breath. There are no associated agents to hypertension. Risk factors for coronary artery disease include obesity, sedentary lifestyle, dyslipidemia, diabetes mellitus and male gender. Past treatments include ACE  inhibitors. The current treatment provides significant improvement. There are no compliance problems.  There is no history of kidney disease. Identifiable causes of hypertension include chronic renal disease.     Past Medical History:   Diagnosis Date   Allergy    Arthritis    SHOULDERS   Balanitis    Chronic back pain    CKD (chronic kidney disease)    Deafness, sensorineural, childhood onset    BILATERAL SINCE AGE 86  SECONDARY TO UNKNOWN ILLNESS   Esophageal dysphagia 11/11/2022   Esophageal varices without bleeding (HCC) 05/09/2018   GERD (gastroesophageal reflux disease)    Hyperlipidemia    Hypertension    Lumbar radiculopathy 02/18/2023   Lumbar spondylosis 05/07/2023   No blood products    Patient is Jehovah Witness no blood products.   OSA on CPAP    SEVERE PER STUDY 03-09-2009   Right renal mass    Smokers' cough (HCC)    Type 2 diabetes mellitus (HCC)      Family History  Problem Relation Age of Onset   Cancer Mother        type unknown pt was 26 years old at that time   Liver disease Father    Healthy Brother    Cancer Other         alot of family members have cancer but doesnt know the type   Colon cancer Neg Hx    Esophageal cancer Neg Hx    Rectal cancer Neg Hx    Stomach cancer Neg Hx    Sleep apnea Neg Hx      Current Outpatient Medications:    Accu-Chek Softclix Lancets lancets, Use as instructed to test blood sugars 3 times a day dx code e11.65, Disp: 200 each, Rfl: 3   Blood Glucose Monitoring Suppl (ACCU-CHEK GUIDE) w/Device KIT, Use to check blood sugars three times daily E11.9, Disp: 1 kit, Rfl: 0   carvedilol  (COREG ) 3.125 MG tablet, Take 1 tablet (3.125 mg total) by mouth 2 (two) times daily., Disp: 180 tablet, Rfl: 3   dapagliflozin  propanediol (FARXIGA ) 10 MG TABS tablet, Take 1 tablet (10 mg total) by mouth daily., Disp: 90 tablet, Rfl: 3   docusate sodium  (COLACE) 100 MG capsule, Take 1 capsule (100 mg total) by mouth 2 (two) times daily., Disp: , Rfl:    ezetimibe  (ZETIA ) 10 MG tablet, Take 1 tablet (10 mg total) by mouth daily., Disp: 90 tablet, Rfl: 3   fenofibrate  (TRICOR ) 145 MG tablet, Take 1 tablet (145 mg total) by mouth daily., Disp: 90 tablet, Rfl: 3   ferrous  sulfate 325 (65 FE) MG EC tablet, Take 1 tablet (325 mg total) by mouth daily with breakfast., Disp: 90 tablet, Rfl: 0   glucose blood (ACCU-CHEK GUIDE TEST) test strip, Use as directed to check blood sugars 3 times daily before meals E11.69, Disp: 200 each, Rfl: 3   glucose blood test strip, Use as instructed, Disp: 100 each, Rfl: 12   HYDROcodone -acetaminophen  (NORCO) 5-325 MG tablet, Take 1-2 tablets by mouth every 6 (six) hours as needed for moderate pain (pain score 4-6) or severe pain (pain score 7-10)., Disp: 20 tablet, Rfl: 0   UNABLE TO FIND, CPAP for OSA (6cm-19cm), Disp: , Rfl:    atorvastatin  (LIPITOR) 40 MG tablet, TAKE 1 TABLET(40 MG) BY MOUTH AT BEDTIME, Disp: 60 tablet, Rfl: 2   icosapent  Ethyl (VASCEPA ) 1 g capsule, TAKE 1 CAPSULE BY MOUTH TWICE DAILY FOR ELEVATED TRIGLYCERIDES, Disp: 180 capsule, Rfl:  2   modafinil  (PROVIGIL ) 200 MG tablet, TAKE 1 TABLET(200 MG) BY MOUTH DAILY, Disp: 30 tablet, Rfl: 3   omeprazole  (PRILOSEC) 10 MG capsule, TAKE 1 CAPSULE(10 MG) BY MOUTH DAILY, Disp: 90 capsule, Rfl: 2   tamsulosin  (FLOMAX ) 0.4 MG CAPS capsule, TAKE 1 CAPSULE BY MOUTH EVERY DAY 30 MINUTES AFTER THE SAME MEAL, Disp: 90 capsule, Rfl: 3   Allergies  Allergen Reactions   Other     Blood Refusal      Review of Systems  Constitutional: Negative.   HENT: Negative.    Respiratory: Negative.  Negative for shortness of breath.   Cardiovascular:  Negative for chest pain and palpitations.  Endocrine: Negative for polydipsia, polyphagia and polyuria.  Genitourinary: Negative.   Neurological:  Negative for dizziness.     Today's Vitals   05/10/24 0921  BP: 130/60  Pulse: 74  Temp: 98.5 F (36.9 C)  TempSrc: Oral  Weight: 214 lb (97.1 kg)  Height: 5' 10 (1.778 m)  PainSc: 3   PainLoc: Back   Body mass index is 30.71 kg/m.  Wt Readings from Last 3 Encounters:  06/14/24 215 lb (97.5 kg)  05/10/24 214 lb (97.1 kg)  01/25/24 209 lb 14.4 oz (95.2 kg)    The 10-year ASCVD  risk score (Arnett DK, et al., 2019) is: 11.4%   Values used to calculate the score:     Age: 78 years     Clincally relevant sex: Male     Is Non-Hispanic African American: No     Diabetic: Yes     Tobacco smoker: No     Systolic Blood Pressure: 101 mmHg     Is BP treated: Yes     HDL Cholesterol: 27 mg/dL     Total Cholesterol: 145 mg/dL  Objective:  Physical Exam HENT:     Head: Normocephalic.  Cardiovascular:     Rate and Rhythm: Normal rate.  Pulmonary:     Effort: Pulmonary effort is normal.  Skin:    General: Skin is warm and dry.  Neurological:     Mental Status: He is alert. Mental status is at baseline.         Assessment And Plan:  Type 2 diabetes mellitus with stage 3a chronic kidney disease, without long-term current use of insulin  (HCC) -     Hemoglobin A1c -     Microalbumin / creatinine urine ratio  Benign hypertension with CKD (chronic kidney disease) stage III (HCC) -     CBC -     CMP14+EGFR  H/O renal cell cancer Assessment & Plan: Had surgery 09/2023 ;stable   Dyslipidemia due to type 2 diabetes mellitus (HCC) -     Lipid panel  Medication refill -     Tamsulosin  HCl; TAKE 1 CAPSULE BY MOUTH EVERY DAY 30 MINUTES AFTER THE SAME MEAL  Dispense: 90 capsule; Refill: 3 -     Omeprazole ; TAKE 1 CAPSULE(10 MG) BY MOUTH DAILY  Dispense: 90 capsule; Refill: 2  Idiopathic esophageal varices without bleeding (HCC) Assessment & Plan: Stable, no bleeding noted. Advised to continue to stay off alcohol to avoid prevent exacerbation of varices.     Return for controlled DM check 4 months.  Patient was given opportunity to ask questions. Patient verbalized understanding of the plan and was able to repeat key elements of the plan. All questions were answered to their satisfaction.  I, Bruna Creighton, NP, have reviewed all documentation for this visit. The documentation on 05/29/2024 for  the exam, diagnosis, procedures, and orders are all accurate and complete.     IF YOU HAVE BEEN REFERRED TO A SPECIALIST, IT MAY TAKE 1-2 WEEKS TO SCHEDULE/PROCESS THE REFERRAL. IF YOU HAVE NOT HEARD FROM US /SPECIALIST IN TWO WEEKS, PLEASE GIVE US  A CALL AT [phone].

## 2024-05-10 NOTE — Patient Instructions (Signed)
 Stephen Chase , Thank you for taking time out of your busy schedule to complete your Annual Wellness Visit with me. I enjoyed our conversation and look forward to speaking with you again next year. I, as well as your care team,  appreciate your ongoing commitment to your health goals. Please review the following plan we discussed and let me know if I can assist you in the future. Your Game plan/ To Do List    Referrals: If you haven't heard from the office you've been referred to, please reach out to them at the phone provided.   Follow up Visits: We will see or speak with you next year for your Next Medicare AWV with our clinical staff Have you seen your provider in the last 6 months (3 months if uncontrolled diabetes)? Yes  Clinician Recommendations:  Aim for 30 minutes of exercise or brisk walking, 6-8 glasses of water , and 5 servings of fruits and vegetables each day.       This is a list of the screenings recommended for you:  Health Maintenance  Topic Date Due   Colon Cancer Screening  02/04/2024   Yearly kidney health urinalysis for diabetes  05/05/2024   Hemoglobin A1C  04/12/2024   Flu Shot  12/19/2024*   Eye exam for diabetics  05/17/2024   COVID-19 Vaccine (7 - 2024-25 season) 06/15/2024   Yearly kidney function blood test for diabetes  12/13/2024   Screening for Lung Cancer  12/20/2024   Complete foot exam   05/10/2025   Medicare Annual Wellness Visit  05/10/2025   DTaP/Tdap/Td vaccine (2 - Td or Tdap) 05/05/2033   Pneumococcal Vaccine for age over 72  Completed   Hepatitis B Vaccine  Completed   Hepatitis C Screening  Completed   HIV Screening  Completed   Zoster (Shingles) Vaccine  Completed   HPV Vaccine  Aged Out   Meningitis B Vaccine  Aged Out  *Topic was postponed. The date shown is not the original due date.    Advanced directives: (Copy Requested) Please bring a copy of your health care power of attorney and living will to the office to be added to your chart at  your convenience. You can mail to [facility] [address] [place], [place] [address] or email to [email or web address] Advance Care Planning is important because it:  [x]  Makes sure you receive the medical care that is consistent with your values, goals, and preferences  [x]  It provides guidance to your family and loved ones and reduces their decisional burden about whether or not they are making the right decisions based on your wishes.  Follow the link provided in your after visit summary or read over the paperwork we have mailed to you to help you started getting your Advance Directives in place. If you need assistance in completing these, please reach out to us  so that we can help you!  See attachments for Preventive Care and Fall Prevention Tips.

## 2024-05-11 ENCOUNTER — Ambulatory Visit: Payer: Medicare PPO

## 2024-05-11 DIAGNOSIS — G4733 Obstructive sleep apnea (adult) (pediatric): Secondary | ICD-10-CM | POA: Diagnosis not present

## 2024-05-11 LAB — CBC
Hematocrit: 42.2 % (ref 37.5–51.0)
Hemoglobin: 13.7 g/dL (ref 13.0–17.7)
MCH: 30.9 pg (ref 26.6–33.0)
MCHC: 32.5 g/dL (ref 31.5–35.7)
MCV: 95 fL (ref 79–97)
Platelets: 152 x10E3/uL (ref 150–450)
RBC: 4.44 x10E6/uL (ref 4.14–5.80)
RDW: 13.2 % (ref 11.6–15.4)
WBC: 5.1 x10E3/uL (ref 3.4–10.8)

## 2024-05-11 LAB — CMP14+EGFR
ALT: 19 IU/L (ref 0–44)
AST: 27 IU/L (ref 0–40)
Albumin: 4.8 g/dL (ref 3.8–4.9)
Alkaline Phosphatase: 70 IU/L (ref 44–121)
BUN/Creatinine Ratio: 17 (ref 9–20)
BUN: 27 mg/dL — ABNORMAL HIGH (ref 6–24)
Bilirubin Total: 0.4 mg/dL (ref 0.0–1.2)
CO2: 21 mmol/L (ref 20–29)
Calcium: 9.9 mg/dL (ref 8.7–10.2)
Chloride: 102 mmol/L (ref 96–106)
Creatinine, Ser: 1.59 mg/dL — ABNORMAL HIGH (ref 0.76–1.27)
Globulin, Total: 2.5 g/dL (ref 1.5–4.5)
Glucose: 155 mg/dL — ABNORMAL HIGH (ref 70–99)
Potassium: 4.4 mmol/L (ref 3.5–5.2)
Sodium: 139 mmol/L (ref 134–144)
Total Protein: 7.3 g/dL (ref 6.0–8.5)
eGFR: 50 mL/min/1.73 — ABNORMAL LOW (ref >59)

## 2024-05-11 LAB — LIPID PANEL
Chol/HDL Ratio: 5.4 ratio — ABNORMAL HIGH (ref 0.0–5.0)
Cholesterol, Total: 145 mg/dL (ref 100–199)
HDL: 27 mg/dL — ABNORMAL LOW (ref >39)
LDL Chol Calc (NIH): 65 mg/dL (ref 0–99)
Triglycerides: 334 mg/dL — ABNORMAL HIGH (ref 0–149)
VLDL Cholesterol Cal: 53 mg/dL — ABNORMAL HIGH (ref 5–40)

## 2024-05-11 LAB — MICROALBUMIN / CREATININE URINE RATIO
Creatinine, Urine: 54.5 mg/dL
Microalb/Creat Ratio: <6 mg/g{creat} (ref 0–29)
Microalbumin, Urine: <3 ug/mL

## 2024-05-11 LAB — HEMOGLOBIN A1C
Est. average glucose Bld gHb Est-mCnc: 143 mg/dL
Hgb A1c MFr Bld: 6.6 % — ABNORMAL HIGH (ref 4.8–5.6)

## 2024-05-15 ENCOUNTER — Other Ambulatory Visit: Payer: Self-pay

## 2024-05-15 DIAGNOSIS — E1169 Type 2 diabetes mellitus with other specified complication: Secondary | ICD-10-CM

## 2024-05-15 MED ORDER — ATORVASTATIN CALCIUM 40 MG PO TABS: ORAL_TABLET | ORAL | 2 refills | Status: DC

## 2024-05-24 DIAGNOSIS — E119 Type 2 diabetes mellitus without complications: Secondary | ICD-10-CM | POA: Diagnosis not present

## 2024-05-24 LAB — HM DIABETES EYE EXAM

## 2024-05-25 ENCOUNTER — Other Ambulatory Visit (HOSPITAL_BASED_OUTPATIENT_CLINIC_OR_DEPARTMENT_OTHER): Payer: Self-pay | Admitting: Internal Medicine

## 2024-05-26 ENCOUNTER — Other Ambulatory Visit (HOSPITAL_COMMUNITY): Payer: Self-pay

## 2024-05-29 ENCOUNTER — Ambulatory Visit: Payer: Self-pay | Admitting: Family Medicine

## 2024-05-29 DIAGNOSIS — Z79899 Other long term (current) drug therapy: Secondary | ICD-10-CM | POA: Insufficient documentation

## 2024-05-29 DIAGNOSIS — N183 Chronic kidney disease, stage 3 unspecified: Secondary | ICD-10-CM | POA: Insufficient documentation

## 2024-05-29 DIAGNOSIS — Z85528 Personal history of other malignant neoplasm of kidney: Secondary | ICD-10-CM | POA: Insufficient documentation

## 2024-05-29 DIAGNOSIS — E785 Hyperlipidemia, unspecified: Secondary | ICD-10-CM | POA: Insufficient documentation

## 2024-05-29 NOTE — Progress Notes (Signed)
 Your A1c has dropped to 6.6 which is good.. Triglycerides levels are gradually trending low.. Your kidney function function is stable. Maintain good blood sugar levels for proper kidney function. Keep taking your farxiga  10 mg once daily.   Thank you

## 2024-05-29 NOTE — Assessment & Plan Note (Addendum)
 Had surgery 09/2023 ;stable

## 2024-05-29 NOTE — Assessment & Plan Note (Signed)
 Continue farxiga 10mg  daily

## 2024-05-30 ENCOUNTER — Other Ambulatory Visit: Payer: Self-pay | Admitting: Adult Health

## 2024-05-30 DIAGNOSIS — G4710 Hypersomnia, unspecified: Secondary | ICD-10-CM

## 2024-06-06 ENCOUNTER — Telehealth: Payer: Self-pay | Admitting: Adult Health

## 2024-06-06 NOTE — Telephone Encounter (Signed)
 His last machine was airsense 10 autoset 6/19 EPR 3.

## 2024-06-06 NOTE — Telephone Encounter (Signed)
 Can we set up a virtual visit to discuss.  His AHI is elevated on this current download.

## 2024-06-06 NOTE — Telephone Encounter (Signed)
 Wife reports pt is not happy with new CPAP, pt would like to go back to previous machine or one very much like the settings versus the set up of current.

## 2024-06-07 NOTE — Telephone Encounter (Signed)
 I called pt, thru sign, that per [place], NP she wanted to to have him make a VV to discuss.  He was not able to VV due to sign language.  I made appt for him in office about the DL and elevated AHI.  He appreciated call back.

## 2024-06-08 ENCOUNTER — Telehealth: Admitting: Adult Health

## 2024-06-08 NOTE — Telephone Encounter (Signed)
 Wife has called with interpreter service explaining that by using interpreter service there could not be a my chart vv.  Phone rep spoke with NP and explained.  Pt has been scheduled for 9-24 for a 9:30 appointment, arrival of 9:00 a.m. wife very thankful for this being arranged for pt, this is FYI no call back requested

## 2024-06-11 DIAGNOSIS — G4733 Obstructive sleep apnea (adult) (pediatric): Secondary | ICD-10-CM | POA: Diagnosis not present

## 2024-06-14 ENCOUNTER — Ambulatory Visit (INDEPENDENT_AMBULATORY_CARE_PROVIDER_SITE_OTHER): Admitting: Adult Health

## 2024-06-14 ENCOUNTER — Ambulatory Visit: Admitting: Adult Health

## 2024-06-14 ENCOUNTER — Encounter: Payer: Self-pay | Admitting: Adult Health

## 2024-06-14 VITALS — BP 101/61 | HR 68 | Ht 69.0 in | Wt 215.0 lb

## 2024-06-14 DIAGNOSIS — G4733 Obstructive sleep apnea (adult) (pediatric): Secondary | ICD-10-CM

## 2024-06-14 DIAGNOSIS — G4710 Hypersomnia, unspecified: Secondary | ICD-10-CM | POA: Diagnosis not present

## 2024-06-14 NOTE — Patient Instructions (Addendum)
 Continue using CPAP nightly and greater than 4 hours each night Look on your machine and see if you can adjust the heated tubing feature/temperature Send a message in 1 month and let me know how you are doing  If your symptoms worsen or you develop new symptoms please let us  know.

## 2024-06-14 NOTE — Progress Notes (Signed)
 PATIENT: Stephen Chase DOB: [birth year]  REASON FOR VISIT: follow up HISTORY FROM: patient, sign language interpreter present  Chief Complaint  Patient presents with   Follow-up    Pt in 4 with interpreter Pt here for cpap f/u Pt is having issue with new pap machine . Pt states cold air coming from mask      HISTORY OF PRESENT ILLNESS: Today 06/14/24:  Stephen Chase is a 58 y.o. male with a history of OSA on CPAP. Returns today for follow-up.  Patient reports he did get a new machine however he feels that the air is too cold.  He is not aware that the temperature there can be adjusted.  Advised that I would send an order to his DME company asking them to make adjustments.  He states because the air is so cold he does not put the mask on as tight.  He feels this may be contributing to the leakage.  His download is below       10/06/23: Stephen Chase is a 57 y.o. male with a history of OSA on CPAP. Returns today for follow-up.  Reports that CPAP is working well for him.  He states that he sometimes runs out of water .  Has never adjusted his humidity.  He continues to find it beneficial.  His download is below.  He continues to use Provigil  and finds it beneficial.  He states if he misses the medication he can tell a big difference.     06/10/23: Stephen Chase is a 58 y.o. male with a history of OSA on CPAP. Returns today for follow-up.  Reports that CPAP is working well for him.  He would like a new machine.  His set up date for this machine was 2015.  His download is below        10/27/22: Stephen Chase is a 58 y.o. male with a history of OSA on CPAP. Returns today for follow-up.  Reports that CPAP is working well for him.  Denies any new issues.  He states that he uses Provigil  daily.  He states that it works well.  However he is noticing that sometimes he is having a hard time falling asleep at night.  His download is below     10/09/21: Stephen Chase is a 58 year old male with a history of  obstructive sleep apnea on CPAP.  He returns today for follow-up.  He reports that CPAP works well for him.  He denies any new issues.  He returns today for follow-up.  10/09/20: Stephen Chase is a 58 year old male with a history of obstructive sleep apnea on CPAP.  Patient reports that he tolerates the CPAP well.  He does find it beneficial.  Denies any new issues.  His CPAP report is indicates:  Compliance Report Usage 09/09/2020 - 10/08/2020 Usage days 30/30 days (100%) >= 4 hours 27 days (90%) Average usage (days used) 5 hours 50 minutes  AirSense 10 AutoSet Serial number [id number] Mode AutoSet Min Pressure 6 cmH2O Max Pressure 19 cmH2O EPR Fulltime EPR level 3  Therapy Pressure - cmH2O Median: 10.0 95th percentile: 14.5 Maximum: 16.3 Leaks - L/min Median: 0.0 95th percentile: 2.1 Maximum: 32.8 Events per hour AI: 1.8 HI: 0.9 AHI: 2.7 Apnea Index Central: 0.2 Obstructive: 1.6 Unknown: 0.0   10/04/19: Stephen Chase is a 58 year old male with a history of obstructive sleep apnea on CPAP.  His download indicates that he uses machine 29 out of 30 days for compliance of 97%.  He uses machine greater than 4 hours 25 days for compliance of 83%.  On average he uses his machine 5 hours and 59 minutes.  His residual AHI is 2.7 on 6 to 19 cm of water  with EPR of 3.  His leak in the 95th percentile is 6.6 L/min.  He states that he continues to take modafinil .  He reports that this offers him good benefit.  He states that he was confused about Flomax  and was taking it during the day and this was causing daytime sleepiness.  Now that he is taking it at bedtime his sleepiness has improved.  He returns today for an evaluation.  HISTORY (Copied from Dr.Dohmeier's note) Stephen Chase is a 58 y.o. male , seen here as in a referral  from PA [name] for a re- evaluation of sleep apnea,in the presence of his sign Presenter, broadcasting.  09-28-2018, follow up on CPAP titration and compliance.  Stephen Chase was  evaluated by a home sleep study dated 08 June 2018 by watch pat device.  The total AHI was 16.6/h there was mild to moderate snoring noticed, oxygen nadir was 89%, average heart rate was 56 bpm.  The patient had already been on CPAP before and we continued CPAP treatment with an auto titration device.  I have the opportunity to see the excellent compliance the patient has used the machine 30 out of 30 days and 26 of those days over 4 hours.  Average use of time is 5 hours 58 minutes each night the AutoSet has a pressure window between 6 cmH2O and 19 cm water  pressure with 3 cm expiratory pressure relief.  The 95th percentile pressure is 16 cmH2O with an AHI of 6.2 this seems to be related to unknown events at 2.5/h and I am not quite sure where they come from.   He does not have central apneas and he has very minimal air leakage.  I noticed that only 3 out of 30 nights had a very high AHI and this may have been a artifactual count the general average AHI is truly under 2/h.    REVIEW OF SYSTEMS: Out of a complete 14 system review of symptoms, the patient complains only of the following symptoms, and all other reviewed systems are negative.  Epworth sleepiness score 14  ALLERGIES: Allergies  Allergen Reactions   Other     Blood Refusal     HOME MEDICATIONS: Outpatient Medications Prior to Visit  Medication Sig Dispense Refill   Accu-Chek Softclix Lancets lancets Use as instructed to test blood sugars 3 times a day dx code e11.65 200 each 3   atorvastatin  (LIPITOR) 40 MG tablet TAKE 1 TABLET(40 MG) BY MOUTH AT BEDTIME 60 tablet 2   Blood Glucose Monitoring Suppl (ACCU-CHEK GUIDE) w/Device KIT Use to check blood sugars three times daily E11.9 1 kit 0   carvedilol  (COREG ) 3.125 MG tablet Take 1 tablet (3.125 mg total) by mouth 2 (two) times daily. 180 tablet 3   dapagliflozin  propanediol (FARXIGA ) 10 MG TABS tablet Take 1 tablet (10 mg total) by mouth daily. 90 tablet 3   docusate sodium   (COLACE) 100 MG capsule Take 1 capsule (100 mg total) by mouth 2 (two) times daily.     ezetimibe  (ZETIA ) 10 MG tablet Take 1 tablet (10 mg total) by mouth daily. 90 tablet 3   fenofibrate  (TRICOR ) 145 MG tablet Take 1 tablet (145 mg total) by mouth daily. 90 tablet 3   glucose blood (ACCU-CHEK GUIDE  TEST) test strip Use as directed to check blood sugars 3 times daily before meals E11.69 200 each 3   glucose blood test strip Use as instructed 100 each 12   HYDROcodone -acetaminophen  (NORCO) 5-325 MG tablet Take 1-2 tablets by mouth every 6 (six) hours as needed for moderate pain (pain score 4-6) or severe pain (pain score 7-10). 20 tablet 0   icosapent  Ethyl (VASCEPA ) 1 g capsule TAKE 1 CAPSULE BY MOUTH TWICE DAILY FOR ELEVATED TRIGLYCERIDES 180 capsule 2   modafinil  (PROVIGIL ) 200 MG tablet TAKE 1 TABLET(200 MG) BY MOUTH DAILY 30 tablet 3   omeprazole  (PRILOSEC) 10 MG capsule TAKE 1 CAPSULE(10 MG) BY MOUTH DAILY 90 capsule 2   tamsulosin  (FLOMAX ) 0.4 MG CAPS capsule TAKE 1 CAPSULE BY MOUTH EVERY DAY 30 MINUTES AFTER THE SAME MEAL 90 capsule 3   UNABLE TO FIND CPAP for OSA (6cm-19cm)     ferrous sulfate  325 (65 FE) MG EC tablet Take 1 tablet (325 mg total) by mouth daily with breakfast. 90 tablet 0   No facility-administered medications prior to visit.    PAST MEDICAL HISTORY: Past Medical History:  Diagnosis Date   Allergy    Arthritis    SHOULDERS   Balanitis    Chronic back pain    CKD (chronic kidney disease)    Deafness, sensorineural, childhood onset    BILATERAL SINCE AGE 64  SECONDARY TO UNKNOWN ILLNESS   Esophageal dysphagia 11/11/2022   Esophageal varices without bleeding (HCC) 05/09/2018   GERD (gastroesophageal reflux disease)    Hyperlipidemia    Hypertension    Lumbar radiculopathy 02/18/2023   Lumbar spondylosis 05/07/2023   No blood products    Patient is Jehovah Witness no blood products.   OSA on CPAP    SEVERE PER STUDY 03-09-2009   Right renal mass    Smokers'  cough (HCC)    Type 2 diabetes mellitus (HCC)     PAST SURGICAL HISTORY: Past Surgical History:  Procedure Laterality Date   CIRCUMCISION N/A 12/19/2013   Procedure: CIRCUMCISION ADULT;  Surgeon: Oliva Oiler, MD;  Location: [facility];  Service: Urology;  Laterality: N/A;   ROBOTIC ASSITED PARTIAL NEPHRECTOMY Right 10/20/2023   Procedure: XI ROBOTIC ASSITED RIGHT PARTIAL NEPHRECTOMY;  Surgeon: Devere Lonni Righter, MD;  Location: [facility];  Service: Urology;  Laterality: Right;   SHOULDER SURGERY Right    SHOULDER SURGERY Left 10/15/2021   WRIST SURGERY Left     FAMILY HISTORY: Family History  Problem Relation Age of Onset   Cancer Mother        type unknown pt was 56 years old at that time   Liver disease Father    Healthy Brother    Cancer Other         alot of family members have cancer but doesnt know the type   Colon cancer Neg Hx    Esophageal cancer Neg Hx    Rectal cancer Neg Hx    Stomach cancer Neg Hx    Sleep apnea Neg Hx     SOCIAL HISTORY: Social History   Socioeconomic History   Marital status: Married    Spouse name: Not on file   Number of children: 4   Years of education: Not on file   Highest education level: Not on file  Occupational History   Occupation: unemployed  Tobacco Use   Smoking status: Former    Current packs/day: 1.00    Average packs/day: 1 pack/day for 30.0  years (30.0 ttl pk-yrs)    Types: Cigarettes   Smokeless tobacco: Never   Tobacco comments:    quit 4 years ago  Vaping Use   Vaping status: Never Used  Substance and Sexual Activity   Alcohol use: Not Currently    Comment: rarely   Drug use: Not Currently   Sexual activity: Yes  Other Topics Concern   Not on file  Social History Narrative   Pt lives with family    Pt works    Social Drivers of Corporate investment banker Strain: Low Risk  (05/10/2024)   Overall Financial Resource Strain (CARDIA)    Difficulty of Paying Living Expenses: Not hard at  all  Food Insecurity: No Food Insecurity (05/10/2024)   Hunger Vital Sign    Worried About Running Out of Food in the Last Year: Never true    Ran Out of Food in the Last Year: Never true  Transportation Needs: No Transportation Needs (05/10/2024)   PRAPARE - Administrator, Civil Service (Medical): No    Lack of Transportation (Non-Medical): No  Physical Activity: Insufficiently Active (05/10/2024)   Exercise Vital Sign    Days of Exercise per Week: 5 days    Minutes of Exercise per Session: 20 min  Stress: No Stress Concern Present (05/10/2024)   [employer] of Occupational Health - Occupational Stress Questionnaire    Feeling of Stress: Not at all  Social Connections: Moderately Integrated (05/10/2024)   Social Connection and Isolation Panel    Frequency of Communication with Friends and Family: More than three times a week    Frequency of Social Gatherings with Friends and Family: Three times a week    Attends Religious Services: More than 4 times per year    Active Member of Clubs or Organizations: No    Attends Banker Meetings: Never    Marital Status: Married  Catering manager Violence: Not At Risk (05/10/2024)   Humiliation, Afraid, Rape, and Kick questionnaire    Fear of Current or Ex-Partner: No    Emotionally Abused: No    Physically Abused: No    Sexually Abused: No      PHYSICAL EXAM  Vitals:   06/14/24 0942  BP: 101/61  Pulse: 68  Weight: 215 lb (97.5 kg)  Height: 5' 9 (1.753 m)       Body mass index is 31.75 kg/m.  Generalized: Well developed, in no acute distress  Chest: Lungs clear to auscultation bilaterally  Neurological examination  Mentation: Alert oriented to time, place, history taking. Follows all commands speech and language fluent Cranial nerve II-XII: Facial symmetry noted Gait and station: Gait is normal.    DIAGNOSTIC DATA (LABS, IMAGING, TESTING) - I reviewed patient records, labs, notes, testing and  imaging myself where available.  Lab Results  Component Value Date   WBC 5.1 05/10/2024   HGB 13.7 05/10/2024   HCT 42.2 05/10/2024   MCV 95 05/10/2024   PLT 152 05/10/2024      Component Value Date/Time   NA 139 05/10/2024 1005   K 4.4 05/10/2024 1005   CL 102 05/10/2024 1005   CO2 21 05/10/2024 1005   GLUCOSE 155 (H) 05/10/2024 1005   GLUCOSE 134 (H) 11/02/2023 0525   BUN 27 (H) 05/10/2024 1005   CREATININE 1.59 (H) 05/10/2024 1005   CALCIUM  9.9 05/10/2024 1005   PROT 7.3 05/10/2024 1005   ALBUMIN  4.8 05/10/2024 1005   AST 27 05/10/2024 1005  ALT 19 05/10/2024 1005   ALKPHOS 70 05/10/2024 1005   BILITOT 0.4 05/10/2024 1005   GFRNONAA >60 11/02/2023 0525   GFRAA 84 10/17/2020 1437   Lab Results  Component Value Date   CHOL 145 05/10/2024   HDL 27 (L) 05/10/2024   LDLCALC 65 05/10/2024   LDLDIRECT 89 09/24/2023   TRIG 334 (H) 05/10/2024   CHOLHDL 5.4 (H) 05/10/2024   Lab Results  Component Value Date   HGBA1C 6.6 (H) 05/10/2024   No results found for: [id number] Lab Results  Component Value Date   TSH 2.110 10/26/2022      ASSESSMENT AND PLAN 58 y.o. year old male  has a past medical history of Allergy, Arthritis, Balanitis, Chronic back pain, CKD (chronic kidney disease), Deafness, sensorineural, childhood onset, Esophageal dysphagia (11/11/2022), Esophageal varices without bleeding (HCC) (05/09/2018), GERD (gastroesophageal reflux disease), Hyperlipidemia, Hypertension, Lumbar radiculopathy (02/18/2023), Lumbar spondylosis (05/07/2023), No blood products, OSA on CPAP, Right renal mass, Smokers' cough (HCC), and Type 2 diabetes mellitus (HCC). here with:  Obstructive sleep apnea on CPAP Hypersomnia  Good compliance Will ask DME to adjust heating tubing to make air warmer. This may be affecting his AHI/leakage d/t to the air being to cold and him loosening his mask.I have asked that he reach out in 1 month and I will review another DL.  Encouraged patient  to continue using CPAP nightly greater than 4 hours each night Continue Provigil  200 mg daily for hypersomnia Follow-up in 1 year or sooner if needed    Duwaine Russell, MSN, NP-C 06/14/2024, 9:46 AM [facility] [address], [address] [place], [place] [address] [phone]    The patient's condition requires frequent monitoring and adjustments in the treatment plan, reflecting the ongoing complexity of care.  This provider is the continuing focal point for all needed services for this condition.

## 2024-06-19 ENCOUNTER — Encounter: Payer: Self-pay | Admitting: Nurse Practitioner

## 2024-06-20 DIAGNOSIS — G4733 Obstructive sleep apnea (adult) (pediatric): Secondary | ICD-10-CM | POA: Diagnosis not present

## 2024-06-21 DIAGNOSIS — Z760 Encounter for issue of repeat prescription: Secondary | ICD-10-CM | POA: Insufficient documentation

## 2024-06-21 NOTE — Assessment & Plan Note (Signed)
 Stable, no bleeding noted. Advised to continue to stay off alcohol to avoid prevent exacerbation of varices.

## 2024-07-03 ENCOUNTER — Telehealth: Payer: Self-pay

## 2024-07-03 NOTE — Telephone Encounter (Signed)
 Copied from CRM [id number]. Topic: Clinical - Medication Question >> Jul 03, 2024 12:38 PM Myrick T wrote: Reason for CRM: wife called stated patient would like the office to complete the paperwork for the medication assistance program that he has with his  dapagliflozin  propanediol (FARXIGA ) 10 MG TABS tablet. Patient recvd an email but is not comfortable filling it out online. Please f/u with patient

## 2024-07-04 ENCOUNTER — Telehealth: Payer: Self-pay

## 2024-07-04 ENCOUNTER — Encounter: Payer: Self-pay | Admitting: Pharmacist

## 2024-07-04 NOTE — Telephone Encounter (Signed)
 PAP: Patient assistance application for Farxiga through [employer] (AZ&Me) has been mailed to pt's home address on file. Provider portion of application will be faxed to provider's office.

## 2024-07-05 ENCOUNTER — Encounter: Payer: Self-pay | Admitting: Pharmacist

## 2024-07-05 NOTE — Progress Notes (Signed)
   07/05/2024  Patient ID: Stephen Chase, male   DOB: 10-04-1965, 58 y.o.   MRN: [id number]   Completed AZ&Me form sent to [facility] for Farxiga  10 mg. 2026 Renewal.     [name], PharmD, [facility] Clinical Pharmacist [phone]

## 2024-07-06 NOTE — Telephone Encounter (Signed)
 Received provider portion [employer] for Farxiga  of PAP application.

## 2024-07-10 ENCOUNTER — Telehealth: Payer: Self-pay | Admitting: Pharmacist

## 2024-07-10 DIAGNOSIS — E1169 Type 2 diabetes mellitus with other specified complication: Secondary | ICD-10-CM

## 2024-07-10 MED ORDER — ATORVASTATIN CALCIUM 40 MG PO TABS: ORAL_TABLET | ORAL | 3 refills | Status: AC

## 2024-07-10 NOTE — Progress Notes (Signed)
   07/10/2024  Patient ID: Stephen Chase, male   DOB: 1965/11/17, 58 y.o.   MRN: [id number] Pharmacy Quality Measure Review  This patient is appearing on a report for being at risk of failing the adherence measure for cholesterol (statin) medications this calendar year.   Medication: Atorvastatin  40 mg  Last fill date: 05/15/24 for 60 day supply  Will collaborate with provider to facilitate refill needs.  Refills sent to Patient's Pharmacy co-signature required.  Lipid Panel     Component Value Date/Time   CHOL 145 05/10/2024 1005   TRIG 334 (H) 05/10/2024 1005   HDL 27 (L) 05/10/2024 1005   CHOLHDL 5.4 (H) 05/10/2024 1005   LDLCALC 65 05/10/2024 1005   LDLDIRECT 89 09/24/2023 0821   LABVLDL 53 (H) 05/10/2024 1005     [name], PharmD, BCACP Clinical Pharmacist [phone]

## 2024-07-11 DIAGNOSIS — G4733 Obstructive sleep apnea (adult) (pediatric): Secondary | ICD-10-CM | POA: Diagnosis not present

## 2024-08-04 ENCOUNTER — Other Ambulatory Visit: Payer: Self-pay | Admitting: Nurse Practitioner

## 2024-08-04 DIAGNOSIS — Z760 Encounter for issue of repeat prescription: Secondary | ICD-10-CM

## 2024-09-05 NOTE — Progress Notes (Deleted)
 LILLETTE Kristeen JINNY Gladis, CMA,acting as a neurosurgeon for [name], FNP.,have documented all relevant documentation on the behalf of [name], FNP,as directed by  [name], FNP while in the presence of [name], FNP.  Subjective:  Patient ID: Stephen Chase , male    DOB: Jan 27, 1966 , 58 y.o.   MRN: [id number]  No chief complaint on file.   HPI  HPI   Past Medical History:  Diagnosis Date   Allergy    Arthritis    SHOULDERS   Balanitis    Chronic back pain    CKD (chronic kidney disease)    Deafness, sensorineural, childhood onset    BILATERAL SINCE AGE 77  SECONDARY TO UNKNOWN ILLNESS   Esophageal dysphagia 11/11/2022   Esophageal varices without bleeding (HCC) 05/09/2018   GERD (gastroesophageal reflux disease)    Hyperlipidemia    Hypertension    Lumbar radiculopathy 02/18/2023   Lumbar spondylosis 05/07/2023   No blood products    Patient is Jehovah Witness no blood products.   OSA on CPAP    SEVERE PER STUDY 03-09-2009   Right renal mass    Smokers' cough (HCC)    Type 2 diabetes mellitus (HCC)      Family History  Problem Relation Age of Onset   Cancer Mother        type unknown pt was 57 years old at that time   Liver disease Father    Healthy Brother    Cancer Other         alot of family members have cancer but doesnt know the type   Colon cancer Neg Hx    Esophageal cancer Neg Hx    Rectal cancer Neg Hx    Stomach cancer Neg Hx    Sleep apnea Neg Hx     Current Medications[1]   Allergies[2]   Review of Systems   There were no vitals filed for this visit. There is no height or weight on file to calculate BMI.  Wt Readings from Last 3 Encounters:  06/14/24 215 lb (97.5 kg)  05/10/24 214 lb (97.1 kg)  01/25/24 209 lb 14.4 oz (95.2 kg)    The 10-year ASCVD risk score (Arnett DK, et al., 2019) is: 11.4%   Values used to calculate the score:     Age: 15 years     Clinically relevant sex: Male     Is Non-Hispanic African American: No      Diabetic: Yes     Tobacco smoker: No     Systolic Blood Pressure: 101 mmHg     Is BP treated: Yes     HDL Cholesterol: 27 mg/dL     Total Cholesterol: 145 mg/dL  Objective:  Physical Exam      Assessment And Plan:   Assessment & Plan Benign hypertension with CKD (chronic kidney disease) stage III (HCC)  Type 2 diabetes mellitus with hyperlipidemia (HCC)  Dyslipidemia due to type 2 diabetes mellitus (HCC)   No orders of the defined types were placed in this encounter.    No follow-ups on file.  Patient was given opportunity to ask questions. Patient verbalized understanding of the plan and was able to repeat key elements of the plan. All questions were answered to their satisfaction.    LILLETTE [name], FNP, have reviewed all documentation for this visit. The documentation on 09/05/2024 for the exam, diagnosis, procedures, and orders are all accurate and complete.   IF YOU HAVE BEEN REFERRED TO A SPECIALIST,  IT MAY TAKE 1-2 WEEKS TO SCHEDULE/PROCESS THE REFERRAL. IF YOU HAVE NOT HEARD FROM US /SPECIALIST IN TWO WEEKS, PLEASE GIVE US  A CALL AT [phone].     [1]  Current Outpatient Medications:    Accu-Chek Softclix Lancets lancets, Use as instructed to test blood sugars 3 times a day dx code e11.65, Disp: 200 each, Rfl: 3   atorvastatin  (LIPITOR) 40 MG tablet, TAKE 1 TABLET(40 MG) BY MOUTH AT BEDTIME, Disp: 90 tablet, Rfl: 3   Blood Glucose Monitoring Suppl (ACCU-CHEK GUIDE) w/Device KIT, Use to check blood sugars three times daily E11.9, Disp: 1 kit, Rfl: 0   carvedilol  (COREG ) 3.125 MG tablet, Take 1 tablet (3.125 mg total) by mouth 2 (two) times daily., Disp: 180 tablet, Rfl: 3   dapagliflozin  propanediol (FARXIGA ) 10 MG TABS tablet, Take 1 tablet (10 mg total) by mouth daily., Disp: 90 tablet, Rfl: 3   docusate sodium  (COLACE) 100 MG capsule, Take 1 capsule (100 mg total) by mouth 2 (two) times daily., Disp: , Rfl:    ezetimibe  (ZETIA ) 10 MG tablet, Take 1 tablet  (10 mg total) by mouth daily., Disp: 90 tablet, Rfl: 3   fenofibrate  (TRICOR ) 145 MG tablet, Take 1 tablet (145 mg total) by mouth daily., Disp: 90 tablet, Rfl: 3   ferrous sulfate  325 (65 FE) MG EC tablet, Take 1 tablet (325 mg total) by mouth daily with breakfast., Disp: 90 tablet, Rfl: 0   glucose blood (ACCU-CHEK GUIDE TEST) test strip, Use as directed to check blood sugars 3 times daily before meals E11.69, Disp: 200 each, Rfl: 3   glucose blood test strip, Use as instructed, Disp: 100 each, Rfl: 12   HYDROcodone -acetaminophen  (NORCO) 5-325 MG tablet, Take 1-2 tablets by mouth every 6 (six) hours as needed for moderate pain (pain score 4-6) or severe pain (pain score 7-10)., Disp: 20 tablet, Rfl: 0   icosapent  Ethyl (VASCEPA ) 1 g capsule, TAKE 1 CAPSULE BY MOUTH TWICE DAILY FOR ELEVATED TRIGLYCERIDES, Disp: 180 capsule, Rfl: 2   modafinil  (PROVIGIL ) 200 MG tablet, TAKE 1 TABLET(200 MG) BY MOUTH DAILY, Disp: 30 tablet, Rfl: 3   omeprazole  (PRILOSEC) 10 MG capsule, TAKE 1 CAPSULE(10 MG) BY MOUTH DAILY, Disp: 90 capsule, Rfl: 2   tamsulosin  (FLOMAX ) 0.4 MG CAPS capsule, TAKE 1 CAPSULE BY MOUTH EVERY DAY 30 MINUTES AFTER THE SAME MEAL, Disp: 90 capsule, Rfl: 3   UNABLE TO FIND, CPAP for OSA (6cm-19cm), Disp: , Rfl:  [2]  Allergies Allergen Reactions   Other     Blood Refusal

## 2024-09-06 ENCOUNTER — Encounter: Payer: Self-pay | Admitting: Nurse Practitioner

## 2024-09-06 ENCOUNTER — Ambulatory Visit: Payer: Self-pay | Admitting: Nurse Practitioner

## 2024-09-07 ENCOUNTER — Ambulatory Visit: Payer: Self-pay | Admitting: Nurse Practitioner

## 2024-09-20 ENCOUNTER — Ambulatory Visit: Admitting: Nurse Practitioner

## 2024-09-20 ENCOUNTER — Encounter: Payer: Self-pay | Admitting: Nurse Practitioner

## 2024-09-20 VITALS — BP 110/60 | HR 74 | Temp 98.4°F | Ht 69.0 in | Wt 225.4 lb

## 2024-09-20 DIAGNOSIS — E1122 Type 2 diabetes mellitus with diabetic chronic kidney disease: Secondary | ICD-10-CM

## 2024-09-20 DIAGNOSIS — R21 Rash and other nonspecific skin eruption: Secondary | ICD-10-CM | POA: Diagnosis not present

## 2024-09-20 DIAGNOSIS — Z23 Encounter for immunization: Secondary | ICD-10-CM

## 2024-09-20 DIAGNOSIS — N183 Chronic kidney disease, stage 3 unspecified: Secondary | ICD-10-CM | POA: Diagnosis not present

## 2024-09-20 DIAGNOSIS — Z6833 Body mass index (BMI) 33.0-33.9, adult: Secondary | ICD-10-CM

## 2024-09-20 DIAGNOSIS — E785 Hyperlipidemia, unspecified: Secondary | ICD-10-CM

## 2024-09-20 DIAGNOSIS — M25551 Pain in right hip: Secondary | ICD-10-CM | POA: Diagnosis not present

## 2024-09-20 DIAGNOSIS — E6609 Other obesity due to excess calories: Secondary | ICD-10-CM

## 2024-09-20 DIAGNOSIS — M25542 Pain in joints of left hand: Secondary | ICD-10-CM

## 2024-09-20 DIAGNOSIS — E1169 Type 2 diabetes mellitus with other specified complication: Secondary | ICD-10-CM | POA: Diagnosis not present

## 2024-09-20 DIAGNOSIS — M25541 Pain in joints of right hand: Secondary | ICD-10-CM

## 2024-09-20 DIAGNOSIS — I129 Hypertensive chronic kidney disease with stage 1 through stage 4 chronic kidney disease, or unspecified chronic kidney disease: Secondary | ICD-10-CM

## 2024-09-20 MED ORDER — CLOBETASOL PROPIONATE 0.05 % EX OINT: 1.0000 | TOPICAL_OINTMENT | Freq: Two times a day (BID) | CUTANEOUS | 1 refills | Status: AC

## 2024-09-20 NOTE — Progress Notes (Signed)
 LILLETTE Kristeen JINNY Gladis, CMA,acting as a neurosurgeon for Stephen Ada, FNP.,have documented all relevant documentation on the behalf of Stephen Ada, FNP,as directed by  Stephen Ada, FNP while in the presence of Stephen Ada, FNP.  Subjective:  Patient ID: Stephen Chase , male    DOB: March 29, 1966 , 58 y.o.   MRN: [id number]  Chief Complaint  Patient presents with   Hypertension    Patient presents today for a bp and dm follow up, Patient reports compliance with medication. Patient denies any chest pain, SOB, or headaches.   Hip Pain    Patient reports he has been having right hip pain for the last few years. He reports the pain is getting worse now.    Rash    Patient reports a few weeks ago he brought silk underwear and it gave him a rash. He reports he has switched back to cotton and the rash is better is still present.      Diabetes He presents for his follow-up diabetic visit. He has type 2 diabetes mellitus. His disease course has been stable. Pertinent negatives for hypoglycemia include no dizziness. Pertinent negatives for diabetes include no chest pain, no fatigue, no polydipsia, no polyphagia and no polyuria. There are no hypoglycemic complications. Symptoms are stable. Diabetic complications include peripheral neuropathy. Risk factors for coronary artery disease include obesity, sedentary lifestyle, diabetes mellitus and male sex. Current diabetic treatment includes oral agent (dual therapy) and diet. His weight is stable. He is following a generally unhealthy diet. When asked about meal planning, he reported none. He has not had a previous visit with a dietitian. He participates in exercise intermittently (he is walking alot and working a strenuous job.). There is no change in his home blood glucose trend. (He has been checking his blood sugars but not sure of the readings) An ACE inhibitor/angiotensin II receptor blocker is being taken. He does not see a podiatrist.Eye exam is current.   Hypertension This is a chronic problem. The current episode started more than 1 year ago. The problem has been gradually improving since onset. The problem is controlled. Pertinent negatives include no chest pain, palpitations or shortness of breath. There are no associated agents to hypertension. Risk factors for coronary artery disease include obesity, sedentary lifestyle, dyslipidemia, diabetes mellitus and male gender. Past treatments include ACE inhibitors. The current treatment provides significant improvement. There are no compliance problems.  There is no history of kidney disease. Identifiable causes of hypertension include chronic renal disease.  Hip Pain  The incident occurred more than 1 week ago. The pain is present in the right hip. The quality of the pain is described as burning and aching. The pain is at a severity of 7/10. The pain is moderate. Pertinent negatives include no loss of sensation or numbness. The symptoms are aggravated by movement (laying on the right side).     Past Medical History:  Diagnosis Date   Allergy    Arthritis    SHOULDERS   Balanitis    Chronic back pain    CKD (chronic kidney disease)    Deafness, sensorineural, childhood onset    BILATERAL SINCE AGE 53  SECONDARY TO UNKNOWN ILLNESS   Esophageal dysphagia 11/11/2022   Esophageal varices without bleeding (HCC) 05/09/2018   GERD (gastroesophageal reflux disease)    Hyperlipidemia    Hypertension    Lumbar radiculopathy 02/18/2023   Lumbar spondylosis 05/07/2023   No blood products    Patient is Jehovah Witness no blood products.  OSA on CPAP    SEVERE PER STUDY 03-09-2009   Right renal mass    Smokers' cough (HCC)    Type 2 diabetes mellitus (HCC)      Family History  Problem Relation Age of Onset   Cancer Mother        type unknown pt was 19 years old at that time   Liver disease Father    Healthy Brother    Cancer Other         alot of family members have cancer but doesnt know the  type   Colon cancer Neg Hx    Esophageal cancer Neg Hx    Rectal cancer Neg Hx    Stomach cancer Neg Hx    Sleep apnea Neg Hx     Current Medications[1]   Allergies[2]   Review of Systems  Constitutional:  Negative for fatigue.  Respiratory:  Negative for shortness of breath.   Cardiovascular:  Negative for chest pain and palpitations.  Endocrine: Negative for polydipsia, polyphagia and polyuria.  Skin:  Positive for rash (groin area which has improved).  Neurological:  Negative for dizziness and numbness.     Today's Vitals   09/20/24 1057  BP: 110/60  Pulse: 74  Temp: 98.4 F (36.9 C)  TempSrc: Oral  Weight: 225 lb 6.4 oz (102.2 kg)  Height: 5' 9 (1.753 m)  PainSc: 7   PainLoc: Hip   Body mass index is 33.29 kg/m.  Wt Readings from Last 3 Encounters:  09/20/24 225 lb 6.4 oz (102.2 kg)  06/14/24 215 lb (97.5 kg)  05/10/24 214 lb (97.1 kg)      Objective:  Physical Exam Vitals and nursing note reviewed.  Constitutional:      General: He is not in acute distress.    Appearance: Normal appearance. He is obese.  Eyes:     Pupils: Pupils are equal, round, and reactive to light.  Cardiovascular:     Rate and Rhythm: Normal rate and regular rhythm.     Pulses: Normal pulses.     Heart sounds: Normal heart sounds. No murmur heard. Pulmonary:     Effort: Pulmonary effort is normal. No respiratory distress.     Breath sounds: Normal breath sounds. No wheezing.  Musculoskeletal:     Right shoulder: Normal.  Skin:    General: Skin is warm and dry.     Capillary Refill: Capillary refill takes less than 2 seconds.  Neurological:     General: No focal deficit present.     Mental Status: He is alert and oriented to person, place, and time.     Cranial Nerves: No cranial nerve deficit.     Motor: No weakness.  Psychiatric:        Mood and Affect: Mood normal.        Behavior: Behavior normal.        Thought Content: Thought content normal.        Judgment:  Judgment normal.      Assessment And Plan:   Assessment & Plan Type 2 diabetes mellitus with hyperlipidemia (HCC) Blood sugars well-controlled. A1c pending. - Checked A1c today. - Continue current diabetes management regimen. Benign hypertension with CKD (chronic kidney disease) stage III (HCC)  Dyslipidemia due to type 2 diabetes mellitus (HCC) Cholesterol levels are still high, he is to continue following up with lipid clinic Need for influenza vaccination Influenza vaccine administered Encouraged to take Tylenol  as needed for fever or muscle aches.  Class 1  obesity due to excess calories with body mass index (BMI) of 33.0 to 33.9 in adult, unspecified whether serious comorbidity present Weight is stable. Continue focusing on healthy diet and regular physical activity with a goal BMI less than 30     Joint pain in both hands Intermittent stiffness and soreness, likely arthritis-related, exacerbated by physical activities. - Recommended Voltaren  gel or other anti-inflammatory pain creams. - Advised to limit carbohydrate and sweet intake to reduce inflammation. Groin rash Present for three weeks, improving, likely irritation. - Prescribed ointment for rash management. Right hip pain Chronic pain rated 6-8/10, exacerbated by walking and sleeping on the right side. Previous back injection ineffective for hip pain. - Provided contact information for orthopedic office. - Advised to call orthopedic office for hip pain evaluation. - Recommended Tylenol  for pain management. - Suggested pain cream for additional relief.  Orders Placed This Encounter  Procedures   Flu vaccine trivalent PF, 6mos and older(Flulaval,Afluria,Fluarix,Fluzone)   Hemoglobin A1c   Lipid panel   BMP8+eGFR   Autoimmune Profile      Return for controlled DM check 4 months.  Patient was given opportunity to ask questions. Patient verbalized understanding of the plan and was able to repeat key elements  of the plan. All questions were answered to their satisfaction.   LILLETTE Stephen Ada, FNP, have reviewed all documentation for this visit. The documentation on 09/20/2024 for the exam, diagnosis, procedures, and orders are all accurate and complete.    IF YOU HAVE BEEN REFERRED TO A SPECIALIST, IT MAY TAKE 1-2 WEEKS TO SCHEDULE/PROCESS THE REFERRAL. IF YOU HAVE NOT HEARD FROM US /SPECIALIST IN TWO WEEKS, PLEASE GIVE US  A CALL AT [phone].      [1]  Current Outpatient Medications:    atorvastatin  (LIPITOR) 40 MG tablet, TAKE 1 TABLET(40 MG) BY MOUTH AT BEDTIME, Disp: 90 tablet, Rfl: 3   carvedilol  (COREG ) 3.125 MG tablet, Take 1 tablet (3.125 mg total) by mouth 2 (two) times daily., Disp: 180 tablet, Rfl: 3   clobetasol  ointment (TEMOVATE ) 0.05 %, Apply 1 Application topically 2 (two) times daily., Disp: 30 g, Rfl: 1   dapagliflozin  propanediol (FARXIGA ) 10 MG TABS tablet, Take 1 tablet (10 mg total) by mouth daily., Disp: 90 tablet, Rfl: 3   docusate sodium  (COLACE) 100 MG capsule, Take 1 capsule (100 mg total) by mouth 2 (two) times daily., Disp: , Rfl:    ezetimibe  (ZETIA ) 10 MG tablet, Take 1 tablet (10 mg total) by mouth daily., Disp: 90 tablet, Rfl: 3   fenofibrate  (TRICOR ) 145 MG tablet, Take 1 tablet (145 mg total) by mouth daily., Disp: 90 tablet, Rfl: 3   ferrous sulfate  325 (65 FE) MG EC tablet, Take 1 tablet (325 mg total) by mouth daily with breakfast., Disp: 90 tablet, Rfl: 0   glucose blood (ACCU-CHEK GUIDE TEST) test strip, Use as directed to check blood sugars 3 times daily before meals E11.69, Disp: 200 each, Rfl: 3   glucose blood test strip, Use as instructed, Disp: 100 each, Rfl: 12   HYDROcodone -acetaminophen  (NORCO) 5-325 MG tablet, Take 1-2 tablets by mouth every 6 (six) hours as needed for moderate pain (pain score 4-6) or severe pain (pain score 7-10)., Disp: 20 tablet, Rfl: 0   icosapent  Ethyl (VASCEPA ) 1 g capsule, TAKE 1 CAPSULE BY MOUTH TWICE DAILY FOR  ELEVATED TRIGLYCERIDES, Disp: 180 capsule, Rfl: 2   modafinil  (PROVIGIL ) 200 MG tablet, TAKE 1 TABLET(200 MG) BY MOUTH DAILY, Disp: 30 tablet, Rfl: 3  olmesartan -hydrochlorothiazide (BENICAR  HCT) 40-25 MG tablet, Take 1 tablet by mouth daily., Disp: , Rfl:    omeprazole  (PRILOSEC) 10 MG capsule, TAKE 1 CAPSULE(10 MG) BY MOUTH DAILY, Disp: 90 capsule, Rfl: 2   tamsulosin  (FLOMAX ) 0.4 MG CAPS capsule, TAKE 1 CAPSULE BY MOUTH EVERY DAY 30 MINUTES AFTER THE SAME MEAL, Disp: 90 capsule, Rfl: 3   UNABLE TO FIND, CPAP for OSA (6cm-19cm), Disp: , Rfl:  [2]  Allergies Allergen Reactions   Other     Blood Refusal

## 2024-09-20 NOTE — Patient Instructions (Addendum)
 [facility] - call to schedule your colonoscopy was due this year.       Address: [address], [place], [place] [address]       Phone: [phone]  Call [facility] about your right hip pain/ [facility] [phone]

## 2024-09-20 NOTE — Telephone Encounter (Signed)
 Reached out to patient regarding PAP application for farxiga  ([employer]) and left HIPAA compliant v/m to return call if needed assistance with application

## 2024-09-22 LAB — LIPID PANEL
Chol/HDL Ratio: 5.5 ratio — ABNORMAL HIGH (ref 0.0–5.0)
Cholesterol, Total: 144 mg/dL (ref 100–199)
HDL: 26 mg/dL — ABNORMAL LOW
LDL Chol Calc (NIH): 63 mg/dL (ref 0–99)
Triglycerides: 349 mg/dL — ABNORMAL HIGH (ref 0–149)
VLDL Cholesterol Cal: 55 mg/dL — ABNORMAL HIGH (ref 5–40)

## 2024-09-22 LAB — BMP8+EGFR
BUN/Creatinine Ratio: 16 (ref 9–20)
BUN: 27 mg/dL — ABNORMAL HIGH (ref 6–24)
CO2: 24 mmol/L (ref 20–29)
Calcium: 10 mg/dL (ref 8.7–10.2)
Chloride: 100 mmol/L (ref 96–106)
Creatinine, Ser: 1.7 mg/dL — ABNORMAL HIGH (ref 0.76–1.27)
Glucose: 157 mg/dL — ABNORMAL HIGH (ref 70–99)
Potassium: 4.7 mmol/L (ref 3.5–5.2)
Sodium: 138 mmol/L (ref 134–144)
eGFR: 46 mL/min/1.73 — ABNORMAL LOW

## 2024-09-22 LAB — HEMOGLOBIN A1C
Est. average glucose Bld gHb Est-mCnc: 163 mg/dL
Hgb A1c MFr Bld: 7.3 % — ABNORMAL HIGH (ref 4.8–5.6)

## 2024-09-22 LAB — AUTOIMMUNE PROFILE
Anti Nuclear Antibody (ANA): NEGATIVE
Complement C3, Serum: 142 mg/dL (ref 82–167)
dsDNA Ab: 4 [IU]/mL (ref 0–9)

## 2024-09-25 DIAGNOSIS — E1122 Type 2 diabetes mellitus with diabetic chronic kidney disease: Secondary | ICD-10-CM

## 2024-10-01 DIAGNOSIS — M25542 Pain in joints of left hand: Secondary | ICD-10-CM | POA: Insufficient documentation

## 2024-10-01 DIAGNOSIS — Z23 Encounter for immunization: Secondary | ICD-10-CM | POA: Insufficient documentation

## 2024-10-01 DIAGNOSIS — R21 Rash and other nonspecific skin eruption: Secondary | ICD-10-CM | POA: Insufficient documentation

## 2024-10-01 NOTE — Assessment & Plan Note (Signed)
 Chronic pain rated 6-8/10, exacerbated by walking and sleeping on the right side. Previous back injection ineffective for hip pain. - Provided contact information for orthopedic office. - Advised to call orthopedic office for hip pain evaluation. - Recommended Tylenol  for pain management. - Suggested pain cream for additional relief.

## 2024-10-01 NOTE — Assessment & Plan Note (Signed)
 Present for three weeks, improving, likely irritation. - Prescribed ointment for rash management.

## 2024-10-01 NOTE — Assessment & Plan Note (Addendum)
 Weight is stable. Continue focusing on healthy diet and regular physical activity with a goal BMI less than 30

## 2024-10-01 NOTE — Assessment & Plan Note (Addendum)
 Influenza vaccine administered Encouraged to take Tylenol as needed for fever or muscle aches.

## 2024-10-01 NOTE — Assessment & Plan Note (Signed)
 Intermittent stiffness and soreness, likely arthritis-related, exacerbated by physical activities. - Recommended Voltaren  gel or other anti-inflammatory pain creams. - Advised to limit carbohydrate and sweet intake to reduce inflammation.

## 2024-10-01 NOTE — Assessment & Plan Note (Signed)
 Blood sugars well-controlled. A1c pending. - Checked A1c today. - Continue current diabetes management regimen.

## 2024-10-01 NOTE — Assessment & Plan Note (Addendum)
 Cholesterol levels are still high, he is to continue following up with lipid clinic

## 2024-10-09 ENCOUNTER — Telehealth: Payer: Self-pay | Admitting: Adult Health

## 2024-10-09 ENCOUNTER — Other Ambulatory Visit: Payer: Self-pay | Admitting: Adult Health

## 2024-10-09 DIAGNOSIS — G4710 Hypersomnia, unspecified: Secondary | ICD-10-CM

## 2024-10-09 NOTE — Telephone Encounter (Signed)
 modafinil  (PROVIGIL ) 200 MG tablet  please send a refill to [facility] on [address] - Pt has an appointment 1/21 with Megan

## 2024-10-10 MED ORDER — MODAFINIL 200 MG PO TABS: 200.0000 mg | ORAL_TABLET | Freq: Every day | ORAL | 5 refills | Status: AC

## 2024-10-10 NOTE — Progress Notes (Unsigned)
 SABRA

## 2024-10-10 NOTE — Addendum Note (Signed)
 Addended by: SHERRYL DUWAINE SQUIBB on: 10/10/2024 08:08 AM   Modules accepted: Orders

## 2024-10-11 ENCOUNTER — Ambulatory Visit: Payer: Medicare PPO | Admitting: Adult Health

## 2024-10-11 ENCOUNTER — Encounter: Payer: Self-pay | Admitting: Adult Health

## 2024-10-11 VITALS — BP 102/45 | HR 87 | Ht 69.0 in | Wt 223.0 lb

## 2024-10-11 DIAGNOSIS — G4733 Obstructive sleep apnea (adult) (pediatric): Secondary | ICD-10-CM

## 2024-10-11 DIAGNOSIS — G4710 Hypersomnia, unspecified: Secondary | ICD-10-CM | POA: Diagnosis not present

## 2024-10-11 NOTE — Progress Notes (Signed)
 "   PATIENT: Stephen Chase DOB: 25-Sep-1965  REASON FOR VISIT: follow up HISTORY FROM: patient, sign language interpreter present  Chief Complaint  Patient presents with   Follow-up    Pt in 19 with interpreter Pt here for cpap f/u Pt states not sleeping well during the night due to shoulder and lower back Pt states reservoir is empty in am Pt states takes  lots of BC Powder for pain       HISTORY OF PRESENT ILLNESS: Today 10/11/24:  Stephen Chase is a 59 y.o. male with a history of obstructive sleep apnea on CPAP. Returns today for follow-up.  He reports that since we adjusted the heat on his machine he has been better.  Still uses a fullface mask.  His download is below     06/14/24: Stephen Chase is a 59 y.o. male with a history of OSA on CPAP. Returns today for follow-up.  Patient reports he did get a new machine however he feels that the air is too cold.  He is not aware that the temperature there can be adjusted.  Advised that I would send an order to his DME company asking them to make adjustments.  He states because the air is so cold he does not put the mask on as tight.  He feels this may be contributing to the leakage.  His download is below       10/06/23: Stephen Chase is a 59 y.o. male with a history of OSA on CPAP. Returns today for follow-up.  Reports that CPAP is working well for him.  He states that he sometimes runs out of water .  Has never adjusted his humidity.  He continues to find it beneficial.  His download is below.  He continues to use Provigil  and finds it beneficial.  He states if he misses the medication he can tell a big difference.     06/10/23: Stephen Chase is a 59 y.o. male with a history of OSA on CPAP. Returns today for follow-up.  Reports that CPAP is working well for him.  He would like a new machine.  His set up date for this machine was 2015.  His download is below        10/27/22: Stephen Chase is a 59 y.o. male with a history of OSA on CPAP. Returns  today for follow-up.  Reports that CPAP is working well for him.  Denies any new issues.  He states that he uses Provigil  daily.  He states that it works well.  However he is noticing that sometimes he is having a hard time falling asleep at night.  His download is below     10/09/21: [name] is a 59 year old male with a history of obstructive sleep apnea on CPAP.  He returns today for follow-up.  He reports that CPAP works well for him.  He denies any new issues.  He returns today for follow-up.  10/09/20: [name] is a 59 year old male with a history of obstructive sleep apnea on CPAP.  Patient reports that he tolerates the CPAP well.  He does find it beneficial.  Denies any new issues.  His CPAP report is indicates:  Compliance Report Usage 09/09/2020 - 10/08/2020 Usage days 30/30 days (100%) >= 4 hours 27 days (90%) Average usage (days used) 5 hours 50 minutes  AirSense 10 AutoSet Serial number [id number] Mode AutoSet Min Pressure 6 cmH2O Max Pressure 19 cmH2O EPR Fulltime EPR level 3  Therapy Pressure - cmH2O Median:  10.0 95th percentile: 14.5 Maximum: 16.3 Leaks - L/min Median: 0.0 95th percentile: 2.1 Maximum: 32.8 Events per hour AI: 1.8 HI: 0.9 AHI: 2.7 Apnea Index Central: 0.2 Obstructive: 1.6 Unknown: 0.0   10/04/19: [name] is a 59 year old male with a history of obstructive sleep apnea on CPAP.  His download indicates that he uses machine 29 out of 30 days for compliance of 97%.  He uses machine greater than 4 hours 25 days for compliance of 83%.  On average he uses his machine 5 hours and 59 minutes.  His residual AHI is 2.7 on 6 to 19 cm of water  with EPR of 3.  His leak in the 95th percentile is 6.6 L/min.  He states that he continues to take modafinil .  He reports that this offers him good benefit.  He states that he was confused about Flomax  and was taking it during the day and this was causing daytime sleepiness.  Now that he is taking it at bedtime his sleepiness  has improved.  He returns today for an evaluation.  HISTORY (Copied from Dr.Dohmeier's note) Stephen Chase is a 59 y.o. male , seen here as in a referral  from [name] for a re- evaluation of sleep apnea,in the presence of his sign presenter, broadcasting.  09-28-2018, follow up on CPAP titration and compliance.  Mr. Debby was evaluated by a home sleep study dated 08 June 2018 by watch pat device.  The total AHI was 16.6/h there was mild to moderate snoring noticed, oxygen nadir was 89%, average heart rate was 56 bpm.  The patient had already been on CPAP before and we continued CPAP treatment with an auto titration device.  I have the opportunity to see the excellent compliance the patient has used the machine 30 out of 30 days and 26 of those days over 4 hours.  Average use of time is 5 hours 58 minutes each night the AutoSet has a pressure window between 6 cmH2O and 19 cm water  pressure with 3 cm expiratory pressure relief.  The 95th percentile pressure is 16 cmH2O with an AHI of 6.2 this seems to be related to unknown events at 2.5/h and I am not quite sure where they come from.   He does not have central apneas and he has very minimal air leakage.  I noticed that only 3 out of 30 nights had a very high AHI and this may have been a artifactual count the general average AHI is truly under 2/h.    REVIEW OF SYSTEMS: Out of a complete 14 system review of symptoms, the patient complains only of the following symptoms, and all other reviewed systems are negative.  Epworth sleepiness score 14  ALLERGIES: Allergies  Allergen Reactions   Other     Blood Refusal     HOME MEDICATIONS: Outpatient Medications Prior to Visit  Medication Sig Dispense Refill   atorvastatin  (LIPITOR) 40 MG tablet TAKE 1 TABLET(40 MG) BY MOUTH AT BEDTIME 90 tablet 3   carvedilol  (COREG ) 3.125 MG tablet Take 1 tablet (3.125 mg total) by mouth 2 (two) times daily. 180 tablet 3   clobetasol  ointment (TEMOVATE ) 0.05 %  Apply 1 Application topically 2 (two) times daily. 30 g 1   dapagliflozin  propanediol (FARXIGA ) 10 MG TABS tablet Take 1 tablet (10 mg total) by mouth daily. 90 tablet 3   docusate sodium  (COLACE) 100 MG capsule Take 1 capsule (100 mg total) by mouth 2 (two) times daily.  ezetimibe  (ZETIA ) 10 MG tablet Take 1 tablet (10 mg total) by mouth daily. 90 tablet 3   fenofibrate  (TRICOR ) 145 MG tablet Take 1 tablet (145 mg total) by mouth daily. 90 tablet 3   ferrous sulfate  325 (65 FE) MG EC tablet Take 1 tablet (325 mg total) by mouth daily with breakfast. 90 tablet 0   glucose blood (ACCU-CHEK GUIDE TEST) test strip Use as directed to check blood sugars 3 times daily before meals E11.69 200 each 3   glucose blood test strip Use as instructed 100 each 12   HYDROcodone -acetaminophen  (NORCO) 5-325 MG tablet Take 1-2 tablets by mouth every 6 (six) hours as needed for moderate pain (pain score 4-6) or severe pain (pain score 7-10). 20 tablet 0   icosapent  Ethyl (VASCEPA ) 1 g capsule TAKE 1 CAPSULE BY MOUTH TWICE DAILY FOR ELEVATED TRIGLYCERIDES 180 capsule 2   modafinil  (PROVIGIL ) 200 MG tablet Take 1 tablet (200 mg total) by mouth daily. 30 tablet 5   olmesartan -hydrochlorothiazide (BENICAR  HCT) 40-25 MG tablet Take 1 tablet by mouth daily.     omeprazole  (PRILOSEC) 10 MG capsule TAKE 1 CAPSULE(10 MG) BY MOUTH DAILY 90 capsule 2   tamsulosin  (FLOMAX ) 0.4 MG CAPS capsule TAKE 1 CAPSULE BY MOUTH EVERY DAY 30 MINUTES AFTER THE SAME MEAL 90 capsule 3   UNABLE TO FIND CPAP for OSA (6cm-19cm)     No facility-administered medications prior to visit.    PAST MEDICAL HISTORY: Past Medical History:  Diagnosis Date   Allergy    Arthritis    SHOULDERS   Balanitis    Chronic back pain    CKD (chronic kidney disease)    Deafness, sensorineural, childhood onset    BILATERAL SINCE AGE 29  SECONDARY TO UNKNOWN ILLNESS   Esophageal dysphagia 11/11/2022   Esophageal varices without bleeding (HCC) 05/09/2018    GERD (gastroesophageal reflux disease)    Hyperlipidemia    Hypertension    Lumbar radiculopathy 02/18/2023   Lumbar spondylosis 05/07/2023   No blood products    Patient is Jehovah Witness no blood products.   OSA on CPAP    SEVERE PER STUDY 03-09-2009   Right renal mass    Smokers' cough (HCC)    Type 2 diabetes mellitus (HCC)     PAST SURGICAL HISTORY: Past Surgical History:  Procedure Laterality Date   CIRCUMCISION N/A 12/19/2013   Procedure: CIRCUMCISION ADULT;  Surgeon: Oliva Oiler, MD;  Location: [facility];  Service: Urology;  Laterality: N/A;   ROBOTIC ASSITED PARTIAL NEPHRECTOMY Right 10/20/2023   Procedure: XI ROBOTIC ASSITED RIGHT PARTIAL NEPHRECTOMY;  Surgeon: Devere Lonni Righter, MD;  Location: [facility];  Service: Urology;  Laterality: Right;   SHOULDER SURGERY Right    SHOULDER SURGERY Left 10/15/2021   WRIST SURGERY Left     FAMILY HISTORY: Family History  Problem Relation Age of Onset   Cancer Mother        type unknown pt was 81 years old at that time   Liver disease Father    Healthy Brother    Cancer Other         alot of family members have cancer but doesnt know the type   Colon cancer Neg Hx    Esophageal cancer Neg Hx    Rectal cancer Neg Hx    Stomach cancer Neg Hx    Sleep apnea Neg Hx     SOCIAL HISTORY: Social History   Socioeconomic History   Marital status: Married  Spouse name: Not on file   Number of children: 4   Years of education: Not on file   Highest education level: Not on file  Occupational History   Occupation: unemployed  Tobacco Use   Smoking status: Former    Current packs/day: 1.00    Average packs/day: 1 pack/day for 30.0 years (30.0 ttl pk-yrs)    Types: Cigarettes   Smokeless tobacco: Never   Tobacco comments:    quit 4 years ago  Vaping Use   Vaping status: Never Used  Substance and Sexual Activity   Alcohol use: Not Currently    Comment: rarely   Drug use: Not Currently   Sexual  activity: Yes  Other Topics Concern   Not on file  Social History Narrative   Pt lives with family    Pt works    Social Drivers of Health   Tobacco Use: Medium Risk (10/11/2024)   Patient History    Smoking Tobacco Use: Former    Smokeless Tobacco Use: Never    Passive Exposure: Not on Actuary Strain: Low Risk (05/10/2024)   Overall Financial Resource Strain (CARDIA)    Difficulty of Paying Living Expenses: Not hard at all  Food Insecurity: No Food Insecurity (05/10/2024)   Epic    Worried About Radiation Protection Practitioner of Food in the Last Year: Never true    Ran Out of Food in the Last Year: Never true  Transportation Needs: No Transportation Needs (05/10/2024)   Epic    Lack of Transportation (Medical): No    Lack of Transportation (Non-Medical): No  Physical Activity: Insufficiently Active (05/10/2024)   Exercise Vital Sign    Days of Exercise per Week: 5 days    Minutes of Exercise per Session: 20 min  Stress: No Stress Concern Present (05/10/2024)   [employer] of Occupational Health - Occupational Stress Questionnaire    Feeling of Stress: Not at all  Social Connections: Moderately Integrated (05/10/2024)   Social Connection and Isolation Panel    Frequency of Communication with Friends and Family: More than three times a week    Frequency of Social Gatherings with Friends and Family: Three times a week    Attends Religious Services: More than 4 times per year    Active Member of Clubs or Organizations: No    Attends Banker Meetings: Never    Marital Status: Married  Catering Manager Violence: Not At Risk (05/10/2024)   Epic    Fear of Current or Ex-Partner: No    Emotionally Abused: No    Physically Abused: No    Sexually Abused: No  Depression (PHQ2-9): Low Risk (05/10/2024)   Depression (PHQ2-9)    PHQ-2 Score: 1  Alcohol Screen: Low Risk (05/10/2024)   Alcohol Screen    Last Alcohol Screening Score (AUDIT): 0  Housing: Unknown (05/10/2024)    Epic    Unable to Pay for Housing in the Last Year: No    Number of Times Moved in the Last Year: Not on file    Homeless in the Last Year: No  Utilities: Not At Risk (05/10/2024)   Epic    Threatened with loss of utilities: No  Health Literacy: Adequate Health Literacy (05/10/2024)   B1300 Health Literacy    Frequency of need for help with medical instructions: Never      PHYSICAL EXAM  Vitals:   10/11/24 1005  BP: (!) 102/45  Pulse: 87  Weight: 223 lb (101.2 kg)  Height: 5'  9 (1.753 m)       Body mass index is 32.93 kg/m.  Generalized: Well developed, in no acute distress  Chest: Lungs clear to auscultation bilaterally  Neurological examination  Mentation: Alert oriented to time, place, history taking. Follows all commands speech and language fluent Cranial nerve II-XII: Facial symmetry noted Gait and station: Gait is normal.    DIAGNOSTIC DATA (LABS, IMAGING, TESTING) - I reviewed patient records, labs, notes, testing and imaging myself where available.  Lab Results  Component Value Date   WBC 5.1 05/10/2024   HGB 13.7 05/10/2024   HCT 42.2 05/10/2024   MCV 95 05/10/2024   PLT 152 05/10/2024      Component Value Date/Time   NA 138 09/20/2024 1154   K 4.7 09/20/2024 1154   CL 100 09/20/2024 1154   CO2 24 09/20/2024 1154   GLUCOSE 157 (H) 09/20/2024 1154   GLUCOSE 134 (H) 11/02/2023 0525   BUN 27 (H) 09/20/2024 1154   CREATININE 1.70 (H) 09/20/2024 1154   CALCIUM  10.0 09/20/2024 1154   PROT 7.3 05/10/2024 1005   ALBUMIN  4.8 05/10/2024 1005   AST 27 05/10/2024 1005   ALT 19 05/10/2024 1005   ALKPHOS 70 05/10/2024 1005   BILITOT 0.4 05/10/2024 1005   GFRNONAA >60 11/02/2023 0525   GFRAA 84 10/17/2020 1437   Lab Results  Component Value Date   CHOL 144 09/20/2024   HDL 26 (L) 09/20/2024   LDLCALC 63 09/20/2024   LDLDIRECT 89 09/24/2023   TRIG 349 (H) 09/20/2024   CHOLHDL 5.5 (H) 09/20/2024   Lab Results  Component Value Date   HGBA1C  7.3 (H) 09/20/2024   No results found for: VITAMINB12 Lab Results  Component Value Date   TSH 2.110 10/26/2022      ASSESSMENT AND PLAN 59 y.o. year old male  has a past medical history of Allergy, Arthritis, Balanitis, Chronic back pain, CKD (chronic kidney disease), Deafness, sensorineural, childhood onset, Esophageal dysphagia (11/11/2022), Esophageal varices without bleeding (HCC) (05/09/2018), GERD (gastroesophageal reflux disease), Hyperlipidemia, Hypertension, Lumbar radiculopathy (02/18/2023), Lumbar spondylosis (05/07/2023), No blood products, OSA on CPAP, Right renal mass, Smokers' cough (HCC), and Type 2 diabetes mellitus (HCC). here with:  Obstructive sleep apnea on CPAP Hypersomnia  Good compliance Residual AHI has improved. Instructed the patient on how to adjust his humidification Encouraged patient to continue using CPAP nightly greater than 4 hours each night Continue Provigil  200 mg daily for hypersomnia Follow-up in 1 year or sooner if needed    Duwaine Russell, MSN, NP-C 10/11/2024, 10:09 AM [facility] [address] [place], [place] [address] [phone]    The patient's condition requires frequent monitoring and adjustments in the treatment plan, reflecting the ongoing complexity of care.  This provider is the continuing focal point for all needed services for this condition.  "

## 2024-10-11 NOTE — Patient Instructions (Addendum)
 Continue using CPAP nightly and greater than 4 hours each night Adjust your humidity on your machine and that can help with the water  running out. Turn down. If your symptoms worsen or you develop new symptoms please let us  know.

## 2024-10-12 ENCOUNTER — Other Ambulatory Visit (HOSPITAL_BASED_OUTPATIENT_CLINIC_OR_DEPARTMENT_OTHER): Payer: Self-pay | Admitting: Internal Medicine

## 2024-10-20 ENCOUNTER — Other Ambulatory Visit: Payer: Self-pay | Admitting: Nurse Practitioner

## 2024-10-26 ENCOUNTER — Ambulatory Visit: Admitting: *Deleted

## 2024-10-26 VITALS — Ht 69.0 in | Wt 215.0 lb

## 2024-10-26 DIAGNOSIS — Z860100 Personal history of colon polyps, unspecified: Secondary | ICD-10-CM

## 2024-10-26 DIAGNOSIS — Z83711 Family history of hyperplastic colon polyps: Secondary | ICD-10-CM

## 2024-10-26 MED ORDER — NA SULFATE-K SULFATE-MG SULF 17.5-3.13-1.6 GM/177ML PO SOLN: 1.0000 | Freq: Once | ORAL | 0 refills | Status: AC

## 2024-10-26 NOTE — Progress Notes (Signed)
 Pt's name and DOB verified at the beginning of the pre-visit with 2 identifiers  Permission given to speak with wife and interpretor present   Pt denies any difficulty with ambulating,sitting, laying down or rolling side to side  Pt has no issues moving head neck or swallowing  No egg or soy allergy known to patient   No issues known to pt with past sedation  No FH of Malignant Hyperthermia  Pt is not on home 02   Pt is not on blood thinners   Pt denies issues with constipation    Pt is not on dialysis  Pt denise any abnormal heart rhythms   Pt denies any upcoming cardiac testing  Patient's chart reviewed by Norleen Schillings CNRA prior to pre-visit and patient appropriate for the [facility].  Pre-visit completed and red dot placed by patient's name on their procedure day (on provider's schedule).     Visit in person  Pt states weight is    Pt given  both [facility] main # and MD on call # prior to instructions.  Informed pt to come in at the time discussed and is shown on PV instructions.  Pt instructed to use Singlecare.com or GoodRx for a price reduction on prep  Instructed pt where to find PV instructions in My Chart and a copy given to pt . Instructed pt on all aspects of written instructions including med holds clothing to wear and foods to eat and not eat as well as after procedure legal restrictions and to call MD on call if needed.. Pt states understanding. Instructed pt to review instructions again prior to procedure and call main # given if has any questions or any issues. Pt states they will.

## 2024-11-09 ENCOUNTER — Encounter: Admitting: Gastroenterology

## 2024-11-21 ENCOUNTER — Ambulatory Visit: Admitting: Nurse Practitioner

## 2025-01-17 ENCOUNTER — Ambulatory Visit: Payer: Self-pay | Admitting: Nurse Practitioner

## 2025-06-13 ENCOUNTER — Ambulatory Visit: Payer: Self-pay

## 2025-06-20 ENCOUNTER — Ambulatory Visit: Admitting: Adult Health

## 2025-10-17 ENCOUNTER — Ambulatory Visit: Admitting: Adult Health
